# Patient Record
Sex: Male | Born: 1950 | Race: Black or African American | Hispanic: No | Marital: Married | Smoking: Former smoker
Health system: Southern US, Community
[De-identification: ages and names within clinical notes are randomized; demographics above are authoritative.]

## PROBLEM LIST (undated history)

## (undated) DIAGNOSIS — I493 Ventricular premature depolarization: Secondary | ICD-10-CM

## (undated) DIAGNOSIS — I10 Essential (primary) hypertension: Secondary | ICD-10-CM

## (undated) DIAGNOSIS — B1920 Unspecified viral hepatitis C without hepatic coma: Secondary | ICD-10-CM

## (undated) DIAGNOSIS — C61 Malignant neoplasm of prostate: Secondary | ICD-10-CM

## (undated) DIAGNOSIS — G4733 Obstructive sleep apnea (adult) (pediatric): Secondary | ICD-10-CM

## (undated) HISTORY — PX: SPLENECTOMY, TOTAL: SHX788

## (undated) HISTORY — DX: Unspecified viral hepatitis C without hepatic coma: B19.20

## (undated) HISTORY — DX: Obstructive sleep apnea (adult) (pediatric): G47.33

## (undated) HISTORY — DX: Ventricular premature depolarization: I49.3

## (undated) HISTORY — PX: OTHER SURGICAL HISTORY: SHX169

---

## 2000-01-25 ENCOUNTER — Encounter: Payer: Self-pay | Admitting: Gastroenterology

## 2000-01-25 ENCOUNTER — Ambulatory Visit (HOSPITAL_COMMUNITY): Admission: RE | Admit: 2000-01-25 | Discharge: 2000-01-25 | Payer: Self-pay | Admitting: Gastroenterology

## 2000-01-25 ENCOUNTER — Encounter (INDEPENDENT_AMBULATORY_CARE_PROVIDER_SITE_OTHER): Payer: Self-pay | Admitting: *Deleted

## 2000-01-28 ENCOUNTER — Emergency Department (HOSPITAL_COMMUNITY): Admission: EM | Admit: 2000-01-28 | Discharge: 2000-01-28 | Payer: Self-pay | Admitting: Emergency Medicine

## 2000-01-28 ENCOUNTER — Encounter: Payer: Self-pay | Admitting: Emergency Medicine

## 2003-06-27 ENCOUNTER — Encounter (INDEPENDENT_AMBULATORY_CARE_PROVIDER_SITE_OTHER): Payer: Self-pay | Admitting: *Deleted

## 2003-06-27 ENCOUNTER — Inpatient Hospital Stay (HOSPITAL_COMMUNITY): Admission: RE | Admit: 2003-06-27 | Discharge: 2003-06-30 | Payer: Self-pay | Admitting: Urology

## 2003-12-26 ENCOUNTER — Emergency Department (HOSPITAL_COMMUNITY): Admission: EM | Admit: 2003-12-26 | Discharge: 2003-12-26 | Payer: Self-pay | Admitting: Emergency Medicine

## 2004-05-06 DIAGNOSIS — C61 Malignant neoplasm of prostate: Secondary | ICD-10-CM

## 2004-05-06 HISTORY — DX: Malignant neoplasm of prostate: C61

## 2006-06-26 ENCOUNTER — Emergency Department (HOSPITAL_COMMUNITY): Admission: EM | Admit: 2006-06-26 | Discharge: 2006-06-27 | Payer: Self-pay | Admitting: Emergency Medicine

## 2006-07-22 ENCOUNTER — Inpatient Hospital Stay (HOSPITAL_COMMUNITY): Admission: EM | Admit: 2006-07-22 | Discharge: 2006-07-25 | Payer: Self-pay | Admitting: Emergency Medicine

## 2006-08-27 ENCOUNTER — Encounter: Admission: RE | Admit: 2006-08-27 | Discharge: 2006-10-27 | Payer: Self-pay | Admitting: Internal Medicine

## 2007-01-28 ENCOUNTER — Encounter (HOSPITAL_COMMUNITY): Admission: RE | Admit: 2007-01-28 | Discharge: 2007-02-03 | Payer: Self-pay | Admitting: Urology

## 2007-02-09 ENCOUNTER — Ambulatory Visit: Admission: RE | Admit: 2007-02-09 | Discharge: 2007-05-06 | Payer: Self-pay | Admitting: Radiation Oncology

## 2009-06-12 ENCOUNTER — Encounter: Admission: RE | Admit: 2009-06-12 | Discharge: 2009-06-12 | Payer: Self-pay | Admitting: Obstetrics and Gynecology

## 2010-01-20 ENCOUNTER — Emergency Department (HOSPITAL_COMMUNITY): Admission: EM | Admit: 2010-01-20 | Discharge: 2010-01-20 | Payer: Self-pay | Admitting: Emergency Medicine

## 2010-07-19 LAB — DIFFERENTIAL
Basophils Absolute: 0 10*3/uL (ref 0.0–0.1)
Basophils Relative: 0 % (ref 0–1)
Eosinophils Absolute: 0 10*3/uL (ref 0.0–0.7)
Eosinophils Relative: 0 % (ref 0–5)
Lymphocytes Relative: 11 % — ABNORMAL LOW (ref 12–46)
Lymphs Abs: 2.1 10*3/uL (ref 0.7–4.0)
Monocytes Absolute: 1.9 10*3/uL — ABNORMAL HIGH (ref 0.1–1.0)
Monocytes Relative: 10 % (ref 3–12)
Neutro Abs: 14.4 10*3/uL — ABNORMAL HIGH (ref 1.7–7.7)
Neutrophils Relative %: 78 % — ABNORMAL HIGH (ref 43–77)

## 2010-07-19 LAB — BASIC METABOLIC PANEL
BUN: 11 mg/dL (ref 6–23)
CO2: 26 mEq/L (ref 19–32)
Calcium: 9.1 mg/dL (ref 8.4–10.5)
Chloride: 99 mEq/L (ref 96–112)
Creatinine, Ser: 0.87 mg/dL (ref 0.4–1.5)
GFR calc Af Amer: >60 mL/min (ref >60)
GFR calc non Af Amer: >60 mL/min (ref >60)
Glucose, Bld: 112 mg/dL — ABNORMAL HIGH (ref 70–99)
Potassium: 3.7 mEq/L (ref 3.5–5.1)
Sodium: 132 mEq/L — ABNORMAL LOW (ref 135–145)

## 2010-07-19 LAB — CBC
HCT: 47.2 % (ref 39.0–52.0)
Hemoglobin: 16.1 g/dL (ref 13.0–17.0)
MCH: 31.7 pg (ref 26.0–34.0)
MCHC: 34.2 g/dL (ref 30.0–36.0)
MCV: 92.9 fL (ref 78.0–100.0)
Platelets: 165 10*3/uL (ref 150–400)
RBC: 5.09 MIL/uL (ref 4.22–5.81)
RDW: 14 % (ref 11.5–15.5)
WBC: 18.4 10*3/uL — ABNORMAL HIGH (ref 4.0–10.5)

## 2010-09-21 NOTE — Op Note (Signed)
NAME:  Mathew [name], HOYING NO.:  [id number]   MEDICAL RECORD NO.:  [id number]          PATIENT TYPE:  INP   LOCATION:  3703                         FACILITY:  [facility]   PHYSICIAN:  Wonda Horner, M.D.   DATE OF BIRTH:  01-22-1951   DATE OF PROCEDURE:  07/24/2006  DATE OF DISCHARGE:                               OPERATIVE REPORT   PROCEDURE:  Esophagogastroduodenoscopy with biopsy for CLOtest.   INDICATIONS FOR PROCEDURE:  Recent history of melena. The patient has  new onset atrial fibrillation. Dr. Radford Pax wanted to make sure there was  not an abnormality in the upper GI tract such as an ulcer which would  preclude the use of Coumadin.   PREMEDICATION:  Fentanyl 50 mcg IV, Versed 5 mg IV.   Informed consent was obtained after explanation of the risks of  bleeding, infection and perforation.   PROCEDURE:  With the patient in the left lateral decubitus position, the  gastroscope was inserted into the oropharynx and passed into the  esophagus.  It was advanced down the esophagus then into the stomach and  into the duodenum.  The second portion of the duodenum was normal.  The  bulb in the duodenum showed several superficial scattered ulcerations  along with duodenitis.  The stomach showed erosive gastritis in the  antrum.  Biopsy for CLOtest was obtained.  The body of the stomach and  fundus and cardia of the stomach looked normal.  The esophagus looked  normal.  He tolerated the procedure well without complications.   IMPRESSION:  1. Erosive antral gastritis.  2. Duodenitis in the bulb with some superficial scattered ulcerations.   PLAN:  Recommend treatment with a proton pump inhibitor.  I think it  would be okay to put the patient on Coumadin.           ______________________________  Wonda Horner, M.D.     SFG/MEDQ  D:  07/24/2006  T:  07/24/2006  Job:  [id number]   cc:   Fransico Him, M.D.

## 2010-09-21 NOTE — Op Note (Signed)
Mathew [name], Mathew [name] NO.:  [id number]   MEDICAL RECORD NO.:  [id number]                   PATIENT TYPE:  INP   LOCATION:  X003                                 FACILITY:  [facility]   PHYSICIAN:  Lillette Boxer. Dahlstedt, M.D.          DATE OF BIRTH:  12-29-1950   DATE OF PROCEDURE:  06/27/2003  DATE OF DISCHARGE:                                 OPERATIVE REPORT   PREOPERATIVE DIAGNOSIS:  Adenocarcinoma of the prostate.   POSTOPERATIVE DIAGNOSIS:  Adenocarcinoma of the prostate.   PROCEDURE:  Radical retropubic prostatectomy plus bilateral pelvic lymph  node dissection.   SURGEON:  Dr. Diona Fanti   ASSISTANT:  Dr. Berdie Ogren   ANESTHESIA:  General endotracheal.   ESTIMATED BLOOD LOSS:  650 mL.   DRAINS:  1. 20 French Foley to straight drain.  2. #10 Blake drain to bulb suction.   SPECIMENS:  1. Bilateral pelvic lymph nodes for pathologic analysis.  2. Prostate.   COMPLICATIONS:  None.   DISPOSITION:  To recovery room in stable condition.   INDICATION FOR PROCEDURE:  [name] is a 60 year old African-  American male, who was found to have an evaluated PSA in late 2004.  He  subsequently underwent a transrectal ultrasound-guided biopsy of the  prostate which demonstrated an adenocarcinoma.  He had a Gleason 3 + 3 = 6  on 5% of the right, 15% on the left.  Given this finding, he is counseled as  to his treatment options including watchful waiting with expectant  management, hormonal therapy, radiation therapy, and surgery.  After careful  consultation, the patient decided to undergo radical retropubic  prostatectomy.  He signed a consent form after understanding risks,  benefits, and alternatives.   DESCRIPTION OF PROCEDURE:  The patient was brought to the operating room and  correctly identified by his identification bracelet.  She got preoperative  antibiotics and general endotracheal anesthesia.  He was placed in the  supine position  with the table flexed.  He was have and prepped in typical  sterile fashion.  A midline vertical infraumbilical incision was made.  The  patient had previous exploratory laparotomy excision that extended to  approximately 4 cm below the umbilicus.  Our incision extended from the  pubis symphysis to approximately 3 cm below the umbilicus.  Skin was incised  with scalpel.  Camper's and Scarpa's fascia were incised with Bovie  electrocautery down to the rectus fascia.  The decussating fibers in the  midline were incised.  The midline was found.  The rectus abdominus bellies  were bluntly dissected.  Surgeon's hand was used to bluntly define the space  of Retzius.  The Bookwalter retractor was then placed, and bilateral pelvic  lymph node dissection was performed.  Extending the dissection was from the  external iliac vein anteriorly to the obturator nerve posteriorly and  inferiorly.  The nodal tissue was taken to the  circumflex iliac vessel.  The  patient had several branches off of his external iliac vein bilaterally.  These were clipped with small black Hem-o-Lok clips and incised.  Blunt  dissection was employed to free the posterior aspect of the lymph node  packet.  A large clip was place proximally and distally on the packet, and  the packet was incised with Metzenbaum scissors.  Attention was then turned  to the prostatectomy.  The areolar tissue overlying the endopelvic fascia  was swept medially bilaterally.  The endopelvic fascia was incised with the  Metzenbaum scissors and carefully dissected with a right angle dissection  and Bovie electrocautery to free the endopelvic fascia away from the  prostate along the entire length of the gland.  There was a small  superficial branch of the dorsal vein complex which was cauterized with the  Bovie electrocautery and incised.  A Kitner was used to sweep the tissues  overlying the puboprostatic ligaments.  These were incised bilaterally  with  Metzenbaum scissors, dropping the apex of the prostate away from the pubis.  Surgeon's hand could then be placed around the apex of the prostate.  The  groove posterior to the dorsal vein complex and anterior to the urethra was  established with surgeon's finger and thumb.  A Hohenfellner right-angle was  then placed within this groove to separate out the dorsal venous complex  away from the urethra.  Two #1 Vicryls ties were passed around the dorsal  venous complex and tied.  The Stamey retractor was placed around the  prostate and a backbleeding stitch using 0 Vicryl was placed in a figure-of-  eight fashion. The Hohenfellner was again placed beneath the dorsal venous  complex and a suture of 0 Vicryl suture ligature was passed and tied.  The  needle was then used, and a figure-of-eight stitch was __________ in the  distal-most aspect of the dorsal venous complex and tied down.  The  Hohenfellner was re-placed, and the Bovie electrocautery was used to incise  the dorsal venous complex carefully.  There was very little bleeding from  the complex after the incision, and no oversewing was required.  The urethra  could now be visualized beneath the dorsal venous complex.  The  neurovascular bundle tissue was seen running lateral to the urethra  bilaterally.  This tissue was carefully dissected away from the urethra with  Metzenbaum scissors.  The Hohenfellner  right-angle was then passed beneath  the urethra, and a piece of umbilical tape was passed around the urethra.  The anterior two-thirds of the urethral wall was incised just distal to the  prostatic apex.  The __________ of the Foley catheter was then well-  lubricated and incised, and the Claiborne Billings was used to grasp the cut end of the  Foley catheter and pull it into the wound.  The posterior urethra was then  incised with Metzenbaum scissors.  There was some rectourethralis attachments posteriorly which were taken down with right  angle dissection  with Metzenbaum scissors.  The lateral pedicles of the prostate were then  taken down systematically with right-angle dissection and placing Hem-o-Lok  clips on the pedicles incising them with Metzenbaum scissors.  The  neurovascular bundles were pushed posteriorly away from the plane of  dissection here.  After the lateral pedicles were taken down sufficiently,  attention was turned to the bladder neck.   Allis clamps were placed proximal and distal to the bladder neck, and a  Vanderbilt clamp was  used to carefully dissect the base of the prostate away  from the bladder neck.  Once the urethra was completely dissected away from  the base of the prostate, a right-angle was placed posterior to the urethra,  and the urethra was incised.  The balloon was drained from the Foley  catheter, and the Foley catheter was brought out through this incision.  The  posterior urethra was then incised with Metzenbaum scissors.  The remainder  of the posterior bladder and prostatic base were then sharply dissected away  from each other, and a good plane was established here.   Now, the only remaining attachments of the prostate were the anterior lobes  of the vas deferens and the seminal vesicles.  There was a little remaining  lateral pedicle bilaterally which was clipped and incised with Metzenbaum  scissors.  On the posterior side of the prostate, the Denonvillier's fascia  was scored with Bovie electrocautery, and this plane was defined with  Metzenbaum scissors.  A right angle was passed around the ampulla of the vas  deferens bilaterally, and large Hem-o-Lok clips were placed on the vas, and  they were incised with the Metzenbaum scissors.  A right angle was placed on  the base of the seminal vesicles bilaterally, and they were incised and tied  with 2-0 silk ties.  The prostate was now freed in its entirety.   The bladder neck was reconstructed.  Several interrupted 4-0 chromic  sutures  were used to evert the bladder mucosa.  A tennis racquet stitch was placed  at 6 o'clock and run to close the bladder neck for approximately 2 cm.  This  gave [place] a very nice, well-everted bladder neck.  The Greenwald urethral  sound was placed within the bladder.  There was excellent urethral stump.  Anastomotic sutures were placed at 2, 5, 7, 10, and 12 o'clock.  The Foley  catheter was placed within the bladder and inflated with approximately 20 mL  of sterile water.  The anastomotic sutures were placed in their  corresponding positions in the bladder neck.  The bladder was released from  traction and allowed to drop down into the pelvis.  Prior to this, careful  inspection for any bleeding revealed no active bleeding.  The anastomotic  sutures were tied down.  A Septo syringe was used to irrigate the Foley  catheter, and there were no clots within the bladder and no leakage at the anastomosis, beginning a very watertight anastomosis.  The pelvis was  copiously irrigated with antibiotic solution.  A #10 Blake drain was placed  in the left lower quadrant by making a stab incision and passing a tonsill  through the anterior body wall and pulling the drain through.  The drain was  sewn in place with 3-0 nylon.  The rectus sheath was reapproximated with a  running #1 PDS.  Subcutaneous tissues were copiously irrigated.  The skin  was closed with a 4-0 PDS in a running fashion.  The anastomotic sutures  used at the urethral stump were 2-0 PDS.  All sponge and needle counts were  correct at the end of the case.  The patient was awakened from his  anesthesia in stable condition, having tolerated the procedure well.  Please  note that Dr. Diona Fanti was present and participated in all aspects of the  case, as he is the primary surgeon.     Berdie Ogren, MD  Lillette Boxer. Dahlstedt, M.D.    DR/MEDQ  D:  06/27/2003  T:  06/27/2003  Job:  [id number]
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
## 2010-09-21 NOTE — Consult Note (Signed)
NAME:  Mathew [name], SHUCK NO.:  [id number]   MEDICAL RECORD NO.:  [id number]          PATIENT TYPE:  INP   LOCATION:  3703                         FACILITY:  [place]   PHYSICIAN:  Fransico Him, M.D.     DATE OF BIRTH:  [birth year]   DATE OF CONSULTATION:  DATE OF DISCHARGE:                                 CONSULTATION   ADDENDUM:  Dr. Penelope Coop has seen the patient and in light of his new onset AFib and  need for anticoagulation, he thinks it is best that we do an upper  endoscopy, which will probably be done tomorrow.  Thank you very much  for this consultation.      Melton Alar, [place]      Fransico Him, M.D.  Electronically Signed    MLY/MEDQ  D:  07/23/2006  T:  07/23/2006  Job:  [id number]   cc:   Wonda Horner, M.D.
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
## 2011-07-31 ENCOUNTER — Other Ambulatory Visit: Payer: Self-pay

## 2011-07-31 ENCOUNTER — Encounter (HOSPITAL_COMMUNITY): Payer: Self-pay | Admitting: Emergency Medicine

## 2011-07-31 ENCOUNTER — Emergency Department (HOSPITAL_COMMUNITY)
Admission: EM | Admit: 2011-07-31 | Discharge: 2011-07-31 | Disposition: A | Discharge status: Home / Self Care | Payer: BC Managed Care – PPO | Attending: Emergency Medicine | Admitting: Emergency Medicine

## 2011-07-31 DIAGNOSIS — F17200 Nicotine dependence, unspecified, uncomplicated: Secondary | ICD-10-CM | POA: Insufficient documentation

## 2011-07-31 DIAGNOSIS — R6883 Chills (without fever): Secondary | ICD-10-CM | POA: Insufficient documentation

## 2011-07-31 DIAGNOSIS — R5381 Other malaise: Secondary | ICD-10-CM | POA: Insufficient documentation

## 2011-07-31 DIAGNOSIS — E876 Hypokalemia: Secondary | ICD-10-CM

## 2011-07-31 DIAGNOSIS — Z9889 Other specified postprocedural states: Secondary | ICD-10-CM | POA: Insufficient documentation

## 2011-07-31 DIAGNOSIS — E860 Dehydration: Secondary | ICD-10-CM

## 2011-07-31 DIAGNOSIS — R197 Diarrhea, unspecified: Secondary | ICD-10-CM | POA: Insufficient documentation

## 2011-07-31 DIAGNOSIS — A088 Other specified intestinal infections: Secondary | ICD-10-CM | POA: Insufficient documentation

## 2011-07-31 DIAGNOSIS — I10 Essential (primary) hypertension: Secondary | ICD-10-CM | POA: Insufficient documentation

## 2011-07-31 DIAGNOSIS — A084 Viral intestinal infection, unspecified: Secondary | ICD-10-CM

## 2011-07-31 DIAGNOSIS — Z79899 Other long term (current) drug therapy: Secondary | ICD-10-CM | POA: Insufficient documentation

## 2011-07-31 DIAGNOSIS — R109 Unspecified abdominal pain: Secondary | ICD-10-CM | POA: Insufficient documentation

## 2011-07-31 HISTORY — DX: Essential (primary) hypertension: I10

## 2011-07-31 LAB — DIFFERENTIAL
Basophils Absolute: 0.1 10*3/uL (ref 0.0–0.1)
Basophils Relative: 1 % (ref 0–1)
Eosinophils Absolute: 0.3 10*3/uL (ref 0.0–0.7)
Eosinophils Relative: 3 % (ref 0–5)
Lymphocytes Relative: 38 % (ref 12–46)
Lymphs Abs: 4.2 10*3/uL — ABNORMAL HIGH (ref 0.7–4.0)
Monocytes Absolute: 1 10*3/uL (ref 0.1–1.0)
Monocytes Relative: 9 % (ref 3–12)
Neutro Abs: 5.4 10*3/uL (ref 1.7–7.7)
Neutrophils Relative %: 50 % (ref 43–77)

## 2011-07-31 LAB — COMPREHENSIVE METABOLIC PANEL
ALT: 80 U/L — ABNORMAL HIGH (ref 0–53)
AST: 49 U/L — ABNORMAL HIGH (ref 0–37)
Albumin: 3.9 g/dL (ref 3.5–5.2)
Alkaline Phosphatase: 80 U/L (ref 39–117)
BUN: 22 mg/dL (ref 6–23)
CO2: 21 mEq/L (ref 19–32)
Calcium: 9.4 mg/dL (ref 8.4–10.5)
Chloride: 99 mEq/L (ref 96–112)
Creatinine, Ser: 1.12 mg/dL (ref 0.50–1.35)
GFR calc Af Amer: 81 mL/min — ABNORMAL LOW (ref >90)
GFR calc non Af Amer: 70 mL/min — ABNORMAL LOW (ref >90)
Glucose, Bld: 95 mg/dL (ref 70–99)
Potassium: 3 mEq/L — ABNORMAL LOW (ref 3.5–5.1)
Sodium: 133 mEq/L — ABNORMAL LOW (ref 135–145)
Total Bilirubin: 0.4 mg/dL (ref 0.3–1.2)
Total Protein: 8.7 g/dL — ABNORMAL HIGH (ref 6.0–8.3)

## 2011-07-31 LAB — URINALYSIS, ROUTINE W REFLEX MICROSCOPIC
Bilirubin Urine: NEGATIVE
Glucose, UA: NEGATIVE mg/dL
Leukocytes, UA: NEGATIVE
Nitrite: NEGATIVE
Protein, ur: 30 mg/dL — AB
Specific Gravity, Urine: 1.03 (ref 1.005–1.030)
Urobilinogen, UA: 0.2 mg/dL (ref 0.0–1.0)
pH: 6 (ref 5.0–8.0)

## 2011-07-31 LAB — CBC
HCT: 51.4 % (ref 39.0–52.0)
Hemoglobin: 17.9 g/dL — ABNORMAL HIGH (ref 13.0–17.0)
MCH: 31.4 pg (ref 26.0–34.0)
MCHC: 34.8 g/dL (ref 30.0–36.0)
MCV: 90.2 fL (ref 78.0–100.0)
Platelets: 245 10*3/uL (ref 150–400)
RBC: 5.7 MIL/uL (ref 4.22–5.81)
RDW: 14.2 % (ref 11.5–15.5)
WBC: 10.9 10*3/uL — ABNORMAL HIGH (ref 4.0–10.5)

## 2011-07-31 LAB — URINE MICROSCOPIC-ADD ON

## 2011-07-31 LAB — LIPASE, BLOOD: Lipase: 28 U/L (ref 11–59)

## 2011-07-31 MED ORDER — SODIUM CHLORIDE 0.9 % IV BOLUS (SEPSIS)
1000.0000 mL | Freq: Once | INTRAVENOUS | Status: AC
  Administered 2011-07-31: 1000 mL via INTRAVENOUS

## 2011-07-31 MED ORDER — ONDANSETRON 8 MG PO TBDP: 8.0000 mg | ORAL_TABLET | Freq: Three times a day (TID) | ORAL | Status: AC | PRN

## 2011-07-31 MED ORDER — POTASSIUM CHLORIDE CRYS ER 20 MEQ PO TBCR: 40.0000 meq | EXTENDED_RELEASE_TABLET | Freq: Every day | ORAL | Status: DC

## 2011-07-31 MED ORDER — POTASSIUM CHLORIDE CRYS ER 20 MEQ PO TBCR
40.0000 meq | EXTENDED_RELEASE_TABLET | Freq: Once | ORAL | Status: AC
  Administered 2011-07-31: 40 meq via ORAL
  Filled 2011-07-31: qty 2

## 2011-07-31 MED ORDER — HYDROCODONE-ACETAMINOPHEN 5-325 MG PO TABS: 1.0000 | ORAL_TABLET | Freq: Four times a day (QID) | ORAL | Status: AC | PRN

## 2011-07-31 NOTE — ED Notes (Signed)
Pt informed of need for urine and given a urinal.

## 2011-07-31 NOTE — ED Notes (Signed)
Pt presenting to ed with c/o nausea, vomiting and diarrhea x 4 days. Pt is alert and oriented at this time. Pt states he ate at a cookout Saturday and has been sick since. Pt states he was up 10 times last night with diarrhea. Pt states he feels weak and can't keep anything down.

## 2011-07-31 NOTE — Discharge Instructions (Signed)
Dehydration, Adult Dehydration is when you lose more fluids from the body than you take in. Vital organs like the kidneys, brain, and heart cannot function without a proper amount of fluids and salt. Any loss of fluids from the body can cause dehydration.  CAUSES   Vomiting.   Diarrhea.   Excessive sweating.   Excessive urine output.   Fever.  SYMPTOMS  Mild dehydration  Thirst.   Dry lips.   Slightly dry mouth.  Moderate dehydration  Very dry mouth.   Sunken eyes.   Skin does not bounce back quickly when lightly pinched and released.   Dark urine and decreased urine production.   Decreased tear production.   Headache.  Severe dehydration  Very dry mouth.   Extreme thirst.   Rapid, weak pulse (more than 100 beats per minute at rest).   Cold hands and feet.   Not able to sweat in spite of heat and temperature.   Rapid breathing.   Blue lips.   Confusion and lethargy.   Difficulty being awakened.   Minimal urine production.   No tears.  DIAGNOSIS  Your caregiver will diagnose dehydration based on your symptoms and your exam. Blood and urine tests will help confirm the diagnosis. The diagnostic evaluation should also identify the cause of dehydration. TREATMENT  Treatment of mild or moderate dehydration can often be done at home by increasing the amount of fluids that you drink. It is best to drink small amounts of fluid more often. Drinking too much at one time can make vomiting worse. Refer to the home care instructions below. Severe dehydration needs to be treated at the hospital where you will probably be given intravenous (IV) fluids that contain water and electrolytes. HOME CARE INSTRUCTIONS   Ask your caregiver about specific rehydration instructions.   Drink enough fluids to keep your urine clear or pale yellow.   Drink small amounts frequently if you have nausea and vomiting.   Eat as you normally do.   Avoid:   Foods or drinks high in  sugar.   Carbonated drinks.   Juice.   Extremely hot or cold fluids.   Drinks with caffeine.   Fatty, greasy foods.   Alcohol.   Tobacco.   Overeating.   Gelatin desserts.   Wash your hands well to avoid spreading bacteria and viruses.   Only take over-the-counter or prescription medicines for pain, discomfort, or fever as directed by your caregiver.   Ask your caregiver if you should continue all prescribed and over-the-counter medicines.   Keep all follow-up appointments with your caregiver.  SEEK MEDICAL CARE IF:  You have abdominal pain and it increases or stays in one area (localizes).   You have a rash, stiff neck, or severe headache.   You are irritable, sleepy, or difficult to awaken.   You are weak, dizzy, or extremely thirsty.  SEEK IMMEDIATE MEDICAL CARE IF:   You are unable to keep fluids down or you get worse despite treatment.   You have frequent episodes of vomiting or diarrhea.   You have blood or green matter (bile) in your vomit.   You have blood in your stool or your stool looks black and tarry.   You have not urinated in 6 to 8 hours, or you have only urinated a small amount of very dark urine.   You have a fever.   You faint.  MAKE SURE YOU:   Understand these instructions.   Will watch your condition.  Will get help right away if you are not doing well or get worse.  Document Released: 04/22/2005 Document Revised: 04/11/2011 Document Reviewed: 12/10/2010 [facility] Patient Information 2012 [place].Hypokalemia Hypokalemia means a low potassium level in the blood.Potassium is an electrolyte that helps regulate the amount of fluid in the body. It also stimulates muscle contraction and maintains a stable acid-base balance.Most of the body's potassium is inside of cells, and only a very small amount is in the blood. Because the amount in the blood is so small, minor changes can have big effects. PREPARATION FOR TEST Testing for  potassium requires taking a blood sample taken by needle from a vein in the arm. The skin is cleaned thoroughly before the sample is drawn. There is no other special preparation needed. NORMAL VALUES Potassium levels below 3.5 mEq/L are abnormally low. Levels above 5.1 mEq/L are abnormally high. Ranges for normal findings may vary among different laboratories and hospitals. You should always check with your doctor after having lab work or other tests done to discuss the meaning of your test results and whether your values are considered within normal limits. MEANING OF TEST  Your caregiver will go over the test results with you and discuss the importance and meaning of your results, as well as treatment options and the need for additional tests, if necessary. A potassium level is frequently part of a routine medical exam. It is usually included as part of a whole "panel" of tests for several blood salts (such as Sodium and Chloride). It may be done as part of follow-up when a low potassium level was found in the past or other blood salts are suspected of being out of balance. A low potassium level might be suspected if you have one or more of the following:  Symptoms of weakness.   Abnormal heart rhythms.   High blood pressure and are taking medication to control this, especially water pills (diuretics).   Kidney disease that can affect your potassium level .   Diabetes requiring the use of insulin. The potassium may fall after taking insulin, especially if the diabetes had been out of control for a while.   A condition requiring the use of cortisone-type medication or certain types of antibiotics.   Vomiting and/or diarrhea for more than a day or two.   A stomach or intestinal condition that may not permit appropriate absorption of potassium.   Fainting episodes.   Mental confusion.  OBTAINING TEST RESULTS It is your responsibility to obtain your test results. Ask the lab or department  performing the test when and how you will get your results.  Please contact your caregiver directly if you have not received the results within one week. At that time, ask if there is anything different or new you should be doing in relation to the results. TREATMENT Hypokalemia can be treated with potassium supplements taken by mouth and/or adjustments in your current medications. A diet high in potassium is also helpful. Foods with high potassium content are:  Peas, lentils, lima beans, nuts, and dried fruit.   Whole grain and bran cereals and breads.   Fresh fruit, vegetables (bananas, cantaloupe, grapefruit, oranges, tomatoes, honeydew melons, potatoes).   Orange and tomato juices.   Meats. If potassium supplement has been prescribed for you today or your medications have been adjusted, see your personal caregiver in time02 for a re-check.  SEEK MEDICAL CARE IF:  There is a feeling of worsening weakness.   You experience repeated chest  palpitations.   You are diabetic and having difficulty keeping your blood sugars in the normal range.   You are experiencing vomiting and/or diarrhea.   You are having difficulty with any of your regular medications.  SEEK IMMEDIATE MEDICAL CARE IF:  You experience chest pain, shortness of breath, or episodes of dizziness.   You have been having vomiting or diarrhea for more than 2 days.   You have a fainting episode.  MAKE SURE YOU:   Understand these instructions.   Will watch your condition.   Will get help right away if you are not doing well or get worse.  Document Released: 04/22/2005 Document Revised: 04/11/2011 Document Reviewed: 04/02/2008 [facility] Patient Information 2012 [place].Hypokalemia Hypokalemia means a low potassium level in the blood.Potassium is an electrolyte that helps regulate the amount of fluid in the body. It also stimulates muscle contraction and maintains a stable acid-base balance.Most of the body's  potassium is inside of cells, and only a very small amount is in the blood. Because the amount in the blood is so small, minor changes can have big effects. PREPARATION FOR TEST Testing for potassium requires taking a blood sample taken by needle from a vein in the arm. The skin is cleaned thoroughly before the sample is drawn. There is no other special preparation needed. NORMAL VALUES Potassium levels below 3.5 mEq/L are abnormally low. Levels above 5.1 mEq/L are abnormally high. Ranges for normal findings may vary among different laboratories and hospitals. You should always check with your doctor after having lab work or other tests done to discuss the meaning of your test results and whether your values are considered within normal limits. MEANING OF TEST  Your caregiver will go over the test results with you and discuss the importance and meaning of your results, as well as treatment options and the need for additional tests, if necessary. A potassium level is frequently part of a routine medical exam. It is usually included as part of a whole "panel" of tests for several blood salts (such as Sodium and Chloride). It may be done as part of follow-up when a low potassium level was found in the past or other blood salts are suspected of being out of balance. A low potassium level might be suspected if you have one or more of the following:  Symptoms of weakness.   Abnormal heart rhythms.   High blood pressure and are taking medication to control this, especially water pills (diuretics).   Kidney disease that can affect your potassium level .   Diabetes requiring the use of insulin. The potassium may fall after taking insulin, especially if the diabetes had been out of control for a while.   A condition requiring the use of cortisone-type medication or certain types of antibiotics.   Vomiting and/or diarrhea for more than a day or two.   A stomach or intestinal condition that may not  permit appropriate absorption of potassium.   Fainting episodes.   Mental confusion.  OBTAINING TEST RESULTS It is your responsibility to obtain your test results. Ask the lab or department performing the test when and how you will get your results.  Please contact your caregiver directly if you have not received the results within one week. At that time, ask if there is anything different or new you should be doing in relation to the results. TREATMENT Hypokalemia can be treated with potassium supplements taken by mouth and/or adjustments in your current medications. A diet high in  potassium is also helpful. Foods with high potassium content are:  Peas, lentils, lima beans, nuts, and dried fruit.   Whole grain and bran cereals and breads.   Fresh fruit, vegetables (bananas, cantaloupe, grapefruit, oranges, tomatoes, honeydew melons, potatoes).   Orange and tomato juices.   Meats. If potassium supplement has been prescribed for you today or your medications have been adjusted, see your personal caregiver in time02 for a re-check.  SEEK MEDICAL CARE IF:  There is a feeling of worsening weakness.   You experience repeated chest palpitations.   You are diabetic and having difficulty keeping your blood sugars in the normal range.   You are experiencing vomiting and/or diarrhea.   You are having difficulty with any of your regular medications.  SEEK IMMEDIATE MEDICAL CARE IF:  You experience chest pain, shortness of breath, or episodes of dizziness.   You have been having vomiting or diarrhea for more than 2 days.   You have a fainting episode.  MAKE SURE YOU:   Understand these instructions.   Will watch your condition.   Will get help right away if you are not doing well or get worse.  Document Released: 04/22/2005 Document Revised: 04/11/2011 Document Reviewed: 04/02/2008 [facility] Patient Information 2012 [place].

## 2011-07-31 NOTE — ED Provider Notes (Signed)
History     CSN: [id number]  Arrival date & time 07/31/11  4462   First MD Initiated Contact with Patient 07/31/11 [id number]      Chief Complaint  Patient presents with  . Diarrhea    (Consider location/radiation/quality/duration/timing/severity/associated sxs/prior treatment) HPI Patient is a 61 year old male who presents today complaining of nausea, vomiting, and diarrhea for the past 4 days. He denies any fevers. Patient said he was having severe diffuse abdominal pain prior to presentation but has no pain at this time. He has felt chills but denies any fevers. Patient past surgical history significant for a traumatic splenectomy but no other prior abdominal surgeries. He was at a cookout on Saturday and wondered if this might be related but his symptoms have persisted over 24 hours. He has no known sick contacts. Patient denies any blood in his emesis or stools. He denies any urinary symptoms. Nothing has made his pain better or worse. Patient is unable to tolerate oral intake at this point.There are no other associated or modifying factors.  Past Medical History  Diagnosis Date  . Hypertension     Past Surgical History  Procedure Date  . Splenectomy, total   . Prostectomy     History reviewed. No pertinent family history.  History  Substance Use Topics  . Smoking status: Current Everyday Smoker -- 0.5 packs/day    Types: Cigarettes  . Smokeless tobacco: Not on file  . Alcohol Use: Yes     weekends      Review of Systems  Constitutional: Positive for fatigue.  HENT: Negative.   Eyes: Negative.   Respiratory: Negative.   Cardiovascular: Negative.   Gastrointestinal: Positive for nausea, vomiting, abdominal pain and diarrhea.  Genitourinary: Negative.   Musculoskeletal: Negative.   Skin: Negative.   Neurological: Negative.   Hematological: Negative.   Psychiatric/Behavioral: Negative.   All other systems reviewed and are negative.    Allergies  Review of  patient's allergies indicates not on file.  Home Medications   Current Outpatient Rx  Name Route Sig Dispense Refill  . ASPIRIN EC 81 MG PO TBEC Oral Take 81 mg by mouth every morning.     Marland Kitchen HYDROCHLOROTHIAZIDE 25 MG PO TABS Oral Take 25 mg by mouth daily.    Marland Kitchen METOPROLOL TARTRATE 50 MG PO TABS Oral Take 75 mg by mouth every morning.    Marland Kitchen PRESCRIPTION MEDICATION Intramuscular Inject 1 Syringe into the muscle as needed. Prostaglandin Injection. For erectile dysfunction.    Marland Kitchen HYDROCODONE-ACETAMINOPHEN 5-325 MG PO TABS Oral Take 1 tablet by mouth every 6 (six) hours as needed for pain. 10 tablet 0  . ONDANSETRON 8 MG PO TBDP Oral Take 1 tablet (8 mg total) by mouth every 8 (eight) hours as needed for nausea. 20 tablet 0  . POTASSIUM CHLORIDE CRYS ER 20 MEQ PO TBCR Oral Take 2 tablets (40 mEq total) by mouth daily. 4 tablet 0    BP 129/84  Pulse 99  Temp(Src) 97.7 F (36.5 C) (Oral)  Resp 18  SpO2 100%  Physical Exam  Nursing note and vitals reviewed. GEN: Well-developed, well-nourished male in no distress HEENT: Atraumatic, normocephalic. Oropharynx clear without erythema EYES: PERRLA BL, no scleral icterus. NECK: Trachea midline, no meningismus CV: regular rate and rhythm. No murmurs, rubs, or gallops PULM: No respiratory distress.  No crackles, wheezes, or rales. GI: soft, non-tender. No guarding, rebound, or tenderness. + bowel sounds  GU: deferred Neuro: cranial nerves 2-12 intact, no abnormalities of strength or  sensation, A and O x 3 MSK: Patient moves all 4 extremities symmetrically, no deformity, edema, or injury noted Skin: No rashes petechiae, purpura, or jaundice Psych: no abnormality of mood   ED Course  Procedures (including critical care time)    Date: 07/31/2011  Rate: 78  Rhythm: normal sinus rhythm  QRS Axis: normal  Intervals: normal  ST/T Wave abnormalities: normal  Conduction Disutrbances: none  Narrative Interpretation: unremarkable      Labs  Reviewed  CBC - Abnormal; Notable for the following:    WBC 10.9 (*)    Hemoglobin 17.9 (*)    All other components within normal limits  DIFFERENTIAL - Abnormal; Notable for the following:    Lymphs Abs 4.2 (*)    All other components within normal limits  COMPREHENSIVE METABOLIC PANEL - Abnormal; Notable for the following:    Sodium 133 (*)    Potassium 3.0 (*)    Total Protein 8.7 (*)    AST 49 (*)    ALT 80 (*)    GFR calc non Af Amer 70 (*)    GFR calc Af Amer 81 (*)    All other components within normal limits  URINALYSIS, ROUTINE W REFLEX MICROSCOPIC - Abnormal; Notable for the following:    APPearance CLOUDY (*)    Hgb urine dipstick SMALL (*)    Ketones, ur TRACE (*)    Protein, ur 30 (*)    All other components within normal limits  URINE MICROSCOPIC-ADD ON - Abnormal; Notable for the following:    Bacteria, UA MANY (*)    Casts HYALINE CASTS (*) GRANULAR CAST   All other components within normal limits  LIPASE, BLOOD   No results found.   1. Viral gastroenteritis   2. Hypokalemia   3. Dehydration       MDM  Patient presents today with symptoms of viral gastroenteritis. Labs were ordered to rule out other possible causes for his symptoms. Urinalysis was remarkable for evidence of dehydration. Patient did have hypokalemia on renal panel. He is slight elevation of his transaminases but had no abdominal pain whatsoever. Lipase was also within normal limits. Patient had no respiratory symptoms that would indicate need for chest x-ray. As patient was feeling better after 2 L of IV fluids as well as Zofran no further therapy was necessary. Patient did have slight hypokalemia and was given potassium by mouth for this. EKG did confirm there were no significant changes with this. Patient was discharged with prescriptions for Zofran as well as Vicodin if his pain returns. He was also given 2 more days of potassium to take given his hypokalemia today. Patient was comfortable  with plan was discharged home in good condition.        Chauncy Passy, MD 07/31/11 1322

## 2011-11-28 ENCOUNTER — Encounter (HOSPITAL_COMMUNITY): Payer: Self-pay | Admitting: *Deleted

## 2011-11-28 ENCOUNTER — Emergency Department (HOSPITAL_COMMUNITY)
Admission: EM | Admit: 2011-11-28 | Discharge: 2011-11-29 | Disposition: A | Discharge status: Home / Self Care | Payer: BC Managed Care – PPO | Attending: Emergency Medicine | Admitting: Emergency Medicine

## 2011-11-28 DIAGNOSIS — I10 Essential (primary) hypertension: Secondary | ICD-10-CM | POA: Insufficient documentation

## 2011-11-28 DIAGNOSIS — N390 Urinary tract infection, site not specified: Secondary | ICD-10-CM | POA: Insufficient documentation

## 2011-11-28 DIAGNOSIS — R319 Hematuria, unspecified: Secondary | ICD-10-CM | POA: Insufficient documentation

## 2011-11-28 DIAGNOSIS — F17200 Nicotine dependence, unspecified, uncomplicated: Secondary | ICD-10-CM | POA: Insufficient documentation

## 2011-11-28 LAB — URINALYSIS, ROUTINE W REFLEX MICROSCOPIC
Bilirubin Urine: NEGATIVE
Glucose, UA: NEGATIVE mg/dL
Ketones, ur: NEGATIVE mg/dL
Nitrite: NEGATIVE
Protein, ur: 100 mg/dL — AB
Specific Gravity, Urine: 1.024 (ref 1.005–1.030)
Urobilinogen, UA: 1 mg/dL (ref 0.0–1.0)
pH: 6.5 (ref 5.0–8.0)

## 2011-11-28 LAB — URINE MICROSCOPIC-ADD ON

## 2011-11-28 MED ORDER — CIPROFLOXACIN HCL 500 MG PO TABS
500.0000 mg | ORAL_TABLET | Freq: Once | ORAL | Status: AC
  Administered 2011-11-28: 500 mg via ORAL
  Filled 2011-11-28: qty 1

## 2011-11-28 MED ORDER — CIPROFLOXACIN HCL 500 MG PO TABS: 500.0000 mg | ORAL_TABLET | Freq: Two times a day (BID) | ORAL | Status: AC

## 2011-11-28 NOTE — ED Notes (Signed)
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 Pt denies any abd pain or dysuria. Pt states this past weekend he did experience some burning w/ urination however that has subsided and has noted experienced any today. Pt also denies fever, n/v/d.

## 2011-11-28 NOTE — ED Provider Notes (Signed)
History     CSN: [id number]  Arrival date & time 11/28/11  2116   First MD Initiated Contact with Patient 11/28/11 2250      Chief Complaint  Patient presents with  . Hematuria    HPI Pt has history of microscopic hematuria but tonight clots of blood today.  He had noticed some dysuria earlier in the week.  He has had some frequency as well.  No fevers,.  No vomiting or diarrhea.  Nothing seems to make it better or worse.   Past Medical History  Diagnosis Date  . Hypertension     Past Surgical History  Procedure Date  . Splenectomy, total   . Prostectomy     History reviewed. No pertinent family history.  History  Substance Use Topics  . Smoking status: Current Everyday Smoker -- 0.5 packs/day    Types: Cigarettes  . Smokeless tobacco: Not on file  . Alcohol Use: Yes     weekends      Review of Systems  All other systems reviewed and are negative.    Allergies  Review of patient's allergies indicates no known allergies.  Home Medications   Current Outpatient Rx  Name Route Sig Dispense Refill  . ASPIRIN EC 81 MG PO TBEC Oral Take 81 mg by mouth every morning.     Marland Kitchen HYDROCHLOROTHIAZIDE 25 MG PO TABS Oral Take 25 mg by mouth daily.    Marland Kitchen METOPROLOL TARTRATE 50 MG PO TABS Oral Take 75 mg by mouth every morning.    Marland Kitchen PRESCRIPTION MEDICATION Intramuscular Inject 1 Syringe into the muscle as needed. Prostaglandin Injection. For erectile dysfunction.      BP 134/88  Pulse 84  Temp 98.5 F (36.9 C) (Oral)  Resp 16  SpO2 95%  Physical Exam  Nursing note and vitals reviewed. Constitutional: He appears well-developed and well-nourished. No distress.  HENT:  Head: Normocephalic and atraumatic.  Right Ear: External ear normal.  Left Ear: External ear normal.  Eyes: Conjunctivae are normal. Right eye exhibits no discharge. Left eye exhibits no discharge. No scleral icterus.  Neck: Neck supple. No tracheal deviation present.  Cardiovascular: Normal rate,  regular rhythm and intact distal pulses.   Pulmonary/Chest: Effort normal and breath sounds normal. No stridor. No respiratory distress. He has no wheezes. He has no rales.  Abdominal: Soft. Bowel sounds are normal. He exhibits no distension. There is no tenderness. There is no rebound and no guarding.  Musculoskeletal: He exhibits no edema and no tenderness.  Neurological: He is alert. He has normal strength. No sensory deficit. Cranial nerve deficit:  no gross defecits noted. He exhibits normal muscle tone. He displays no seizure activity. Coordination normal.  Skin: Skin is warm and dry. No rash noted.  Psychiatric: He has a normal mood and affect.    ED Course  Procedures (including critical care time)  Labs Reviewed  URINALYSIS, ROUTINE W REFLEX MICROSCOPIC - Abnormal; Notable for the following:    Color, Urine AMBER (*)  BIOCHEMICALS MAY BE AFFECTED BY COLOR   APPearance CLOUDY (*)     Hgb urine dipstick LARGE (*)     Protein, ur 100 (*)     Leukocytes, UA SMALL (*)     All other components within normal limits  URINE MICROSCOPIC-ADD ON  URINE CULTURE   No results found.   1. UTI (lower urinary tract infection)   2. Hematuria       MDM  Pt appears to have a UTI.  Will dc home on oral abx.  He has plans to follow up with his urologist.  If the symptoms persist, he made need cystoscopy.  At this time there does not appear to be any evidence of an acute emergency medical condition and the patient appears stable for discharge with appropriate outpatient follow up.         Kathalene Frames, MD 11/29/11 [phone]

## 2011-11-29 LAB — POCT I-STAT, CHEM 8
BUN: 13 mg/dL (ref 6–23)
Calcium, Ion: 1.22 mmol/L (ref 1.12–1.23)
Chloride: 103 mEq/L (ref 96–112)
Creatinine, Ser: 0.9 mg/dL (ref 0.50–1.35)
Glucose, Bld: 99 mg/dL (ref 70–99)
HCT: 48 % (ref 39.0–52.0)
Hemoglobin: 16.3 g/dL (ref 13.0–17.0)
Potassium: 3.3 mEq/L — ABNORMAL LOW (ref 3.5–5.1)
Sodium: 139 mEq/L (ref 135–145)
TCO2: 24 mmol/L (ref 0–100)

## 2011-11-30 LAB — URINE CULTURE: Colony Count: >=100000

## 2012-07-25 ENCOUNTER — Emergency Department (HOSPITAL_COMMUNITY)
Admission: EM | Admit: 2012-07-25 | Discharge: 2012-07-25 | Disposition: A | Discharge status: Home / Self Care | Payer: BC Managed Care – PPO | Attending: Emergency Medicine | Admitting: Emergency Medicine

## 2012-07-25 ENCOUNTER — Encounter (HOSPITAL_COMMUNITY): Payer: Self-pay | Admitting: *Deleted

## 2012-07-25 DIAGNOSIS — F17200 Nicotine dependence, unspecified, uncomplicated: Secondary | ICD-10-CM | POA: Insufficient documentation

## 2012-07-25 DIAGNOSIS — Z79899 Other long term (current) drug therapy: Secondary | ICD-10-CM | POA: Insufficient documentation

## 2012-07-25 DIAGNOSIS — N390 Urinary tract infection, site not specified: Secondary | ICD-10-CM | POA: Insufficient documentation

## 2012-07-25 DIAGNOSIS — Z8546 Personal history of malignant neoplasm of prostate: Secondary | ICD-10-CM | POA: Insufficient documentation

## 2012-07-25 DIAGNOSIS — R319 Hematuria, unspecified: Secondary | ICD-10-CM | POA: Insufficient documentation

## 2012-07-25 DIAGNOSIS — Z7982 Long term (current) use of aspirin: Secondary | ICD-10-CM | POA: Insufficient documentation

## 2012-07-25 DIAGNOSIS — R350 Frequency of micturition: Secondary | ICD-10-CM | POA: Insufficient documentation

## 2012-07-25 DIAGNOSIS — I10 Essential (primary) hypertension: Secondary | ICD-10-CM | POA: Insufficient documentation

## 2012-07-25 DIAGNOSIS — Z9079 Acquired absence of other genital organ(s): Secondary | ICD-10-CM | POA: Insufficient documentation

## 2012-07-25 HISTORY — DX: Malignant neoplasm of prostate: C61

## 2012-07-25 LAB — URINALYSIS, ROUTINE W REFLEX MICROSCOPIC
Bilirubin Urine: NEGATIVE
Glucose, UA: NEGATIVE mg/dL
Ketones, ur: NEGATIVE mg/dL
Nitrite: NEGATIVE
Protein, ur: >300 mg/dL — AB
Specific Gravity, Urine: 1.026 (ref 1.005–1.030)
Urobilinogen, UA: 1 mg/dL (ref 0.0–1.0)
pH: 6.5 (ref 5.0–8.0)

## 2012-07-25 LAB — URINE MICROSCOPIC-ADD ON

## 2012-07-25 MED ORDER — CEPHALEXIN 500 MG PO CAPS: 500.0000 mg | ORAL_CAPSULE | Freq: Four times a day (QID) | ORAL | Status: DC

## 2012-07-25 NOTE — ED Notes (Signed)
Pt states this morning he went urinate and urinated bright red blood.  Pt denies pain, but states he feels a sense of pressure in his lower abdomen, like he has to urinate constantly.  Pt denies N/V and fever.

## 2012-07-25 NOTE — ED Notes (Signed)
Patient bladder scanned, 31m noted.

## 2012-07-25 NOTE — ED Provider Notes (Signed)
History     CSN: [id number]  Arrival date & time 07/25/12  1304   First MD Initiated Contact with Patient 07/25/12 1320      Chief Complaint  Patient presents with  . Hematuria    (Consider location/radiation/quality/duration/timing/severity/associated sxs/prior treatment) HPI Comments: Patient is a 62 year old male who presents with hematuria since this morning. The symptoms started suddenly and remained constant. Patient noticed the hematuria when urinating this morning. The hematuria remains constant throughout the whole stream and remains constant. He denies pain. Patient reports an episode of hematuria in the past and was diagnosed with a UTI. Patient reports other instances of microscopic hematuria at doctor's visit since his UTI diagnosis in July 2013. No aggravating/alleviating factors. Associated urinary frequency.    Past Medical History  Diagnosis Date  . Hypertension   . Prostate cancer 2006    Past Surgical History  Procedure Laterality Date  . Splenectomy, total    . Prostectomy      No family history on file.  History  Substance Use Topics  . Smoking status: Current Every Day Smoker -- 0.50 packs/day    Types: Cigarettes  . Smokeless tobacco: Never Used  . Alcohol Use: Yes     Comment: weekends      Review of Systems  Genitourinary: Positive for hematuria.  All other systems reviewed and are negative.    Allergies  Review of patient's allergies indicates no known allergies.  Home Medications   Current Outpatient Rx  Name  Route  Sig  Dispense  Refill  . aspirin EC 81 MG tablet   Oral   Take 81 mg by mouth every morning.          . hydrochlorothiazide (HYDRODIURIL) 25 MG tablet   Oral   Take 25 mg by mouth daily.         . metoprolol (LOPRESSOR) 50 MG tablet   Oral   Take 75 mg by mouth every morning.         Marland Kitchen PRESCRIPTION MEDICATION   Intramuscular   Inject 1 Syringe into the muscle as needed. Prostaglandin Injection. For  erectile dysfunction.           BP 138/97  Pulse 88  Resp 17  SpO2 100%  Physical Exam  Nursing note and vitals reviewed. Constitutional: He is oriented to person, place, and time. He appears well-developed and well-nourished. No distress.  HENT:  Head: Normocephalic and atraumatic.  Eyes: Conjunctivae are normal.  Neck: Normal range of motion.  Cardiovascular: Normal rate and regular rhythm.  Exam reveals no gallop and no friction rub.   No murmur heard. Pulmonary/Chest: Effort normal and breath sounds normal. He has no wheezes. He has no rales. He exhibits no tenderness.  Abdominal: Soft. He exhibits no distension. There is no tenderness. There is no rebound and no guarding.  Musculoskeletal: Normal range of motion.  Neurological: He is alert and oriented to person, place, and time. Coordination normal.  Speech is goal-oriented. Moves limbs without ataxia.   Skin: Skin is warm and dry.  Psychiatric: He has a normal mood and affect. His behavior is normal.    ED Course  Procedures (including critical care time)  Labs Reviewed  URINALYSIS, ROUTINE W REFLEX MICROSCOPIC - Abnormal; Notable for the following:    Color, Urine RED (*)    APPearance TURBID (*)    Hgb urine dipstick LARGE (*)    Protein, ur >300 (*)    Leukocytes, UA TRACE (*)  All other components within normal limits  URINE MICROSCOPIC-ADD ON   No results found.   1. UTI (urinary tract infection)   2. Painless hematuria       MDM  1:37 PM Urinalysis pending.   2:51 PM Patient will be treated for UTI and have a recommended Urology follow up. I will treat him with Keflex. Patient instructed to return with worsening or concerning symptoms. Vitals stable at this time.      Alvina Chou, PA-C 07/25/12 1511

## 2012-07-26 LAB — URINE CULTURE
Colony Count: NO GROWTH
Culture: NO GROWTH
Special Requests: NORMAL

## 2012-07-26 NOTE — ED Provider Notes (Signed)
History/physical exam/procedure(s) were performed by non-physician practitioner and as supervising physician I was immediately available for consultation/collaboration. I have reviewed all notes and am in agreement with care and plan.   Shaune Pollack, MD 07/26/12 [phone]

## 2013-02-19 ENCOUNTER — Other Ambulatory Visit: Payer: Self-pay | Admitting: Family Medicine

## 2013-02-19 DIAGNOSIS — R310 Gross hematuria: Secondary | ICD-10-CM

## 2013-02-25 ENCOUNTER — Ambulatory Visit
Admission: RE | Admit: 2013-02-25 | Discharge: 2013-02-25 | Disposition: A | Discharge status: Home / Self Care | Payer: BC Managed Care – PPO | Source: Ambulatory Visit | Attending: Family Medicine | Admitting: Family Medicine

## 2013-02-25 DIAGNOSIS — R310 Gross hematuria: Secondary | ICD-10-CM

## 2013-06-06 ENCOUNTER — Encounter (HOSPITAL_COMMUNITY): Payer: Self-pay | Admitting: Emergency Medicine

## 2013-06-06 ENCOUNTER — Emergency Department (HOSPITAL_COMMUNITY)
Admission: EM | Admit: 2013-06-06 | Discharge: 2013-06-07 | Disposition: A | Discharge status: Home / Self Care | Payer: BC Managed Care – PPO | Attending: Emergency Medicine | Admitting: Emergency Medicine

## 2013-06-06 ENCOUNTER — Emergency Department (HOSPITAL_COMMUNITY): Payer: BC Managed Care – PPO

## 2013-06-06 DIAGNOSIS — Z8546 Personal history of malignant neoplasm of prostate: Secondary | ICD-10-CM | POA: Insufficient documentation

## 2013-06-06 DIAGNOSIS — R059 Cough, unspecified: Secondary | ICD-10-CM | POA: Insufficient documentation

## 2013-06-06 DIAGNOSIS — R110 Nausea: Secondary | ICD-10-CM | POA: Insufficient documentation

## 2013-06-06 DIAGNOSIS — F17200 Nicotine dependence, unspecified, uncomplicated: Secondary | ICD-10-CM | POA: Insufficient documentation

## 2013-06-06 DIAGNOSIS — R0789 Other chest pain: Secondary | ICD-10-CM | POA: Insufficient documentation

## 2013-06-06 DIAGNOSIS — Z79899 Other long term (current) drug therapy: Secondary | ICD-10-CM | POA: Insufficient documentation

## 2013-06-06 DIAGNOSIS — R05 Cough: Secondary | ICD-10-CM | POA: Insufficient documentation

## 2013-06-06 DIAGNOSIS — M2559 Pain in other specified joint: Secondary | ICD-10-CM | POA: Insufficient documentation

## 2013-06-06 DIAGNOSIS — I4891 Unspecified atrial fibrillation: Secondary | ICD-10-CM | POA: Insufficient documentation

## 2013-06-06 DIAGNOSIS — I10 Essential (primary) hypertension: Secondary | ICD-10-CM | POA: Insufficient documentation

## 2013-06-06 DIAGNOSIS — R079 Chest pain, unspecified: Secondary | ICD-10-CM

## 2013-06-06 DIAGNOSIS — M2550 Pain in unspecified joint: Secondary | ICD-10-CM

## 2013-06-06 LAB — COMPREHENSIVE METABOLIC PANEL
ALT: 114 U/L — ABNORMAL HIGH (ref 0–53)
AST: 72 U/L — ABNORMAL HIGH (ref 0–37)
Albumin: 3.6 g/dL (ref 3.5–5.2)
Alkaline Phosphatase: 82 U/L (ref 39–117)
BUN: 15 mg/dL (ref 6–23)
CO2: 23 mEq/L (ref 19–32)
Calcium: 9 mg/dL (ref 8.4–10.5)
Chloride: 100 mEq/L (ref 96–112)
Creatinine, Ser: 0.83 mg/dL (ref 0.50–1.35)
GFR calc Af Amer: >90 mL/min (ref >90)
GFR calc non Af Amer: >90 mL/min (ref >90)
Glucose, Bld: 115 mg/dL — ABNORMAL HIGH (ref 70–99)
Potassium: 3.3 mEq/L — ABNORMAL LOW (ref 3.7–5.3)
Sodium: 137 mEq/L (ref 137–147)
Total Bilirubin: 0.5 mg/dL (ref 0.3–1.2)
Total Protein: 8.1 g/dL (ref 6.0–8.3)

## 2013-06-06 LAB — URINALYSIS, ROUTINE W REFLEX MICROSCOPIC
Bilirubin Urine: NEGATIVE
Glucose, UA: NEGATIVE mg/dL
Hgb urine dipstick: NEGATIVE
Ketones, ur: NEGATIVE mg/dL
Leukocytes, UA: NEGATIVE
Nitrite: NEGATIVE
Protein, ur: NEGATIVE mg/dL
Specific Gravity, Urine: 1.028 (ref 1.005–1.030)
Urobilinogen, UA: 1 mg/dL (ref 0.0–1.0)
pH: 7 (ref 5.0–8.0)

## 2013-06-06 LAB — CBC
HCT: 45.6 % (ref 39.0–52.0)
Hemoglobin: 15.9 g/dL (ref 13.0–17.0)
MCH: 32 pg (ref 26.0–34.0)
MCHC: 34.9 g/dL (ref 30.0–36.0)
MCV: 91.8 fL (ref 78.0–100.0)
Platelets: 180 10*3/uL (ref 150–400)
RBC: 4.97 MIL/uL (ref 4.22–5.81)
RDW: 14.6 % (ref 11.5–15.5)
WBC: 12.5 10*3/uL — ABNORMAL HIGH (ref 4.0–10.5)

## 2013-06-06 LAB — PRO B NATRIURETIC PEPTIDE: Pro B Natriuretic peptide (BNP): 12.9 pg/mL (ref 0–125)

## 2013-06-06 LAB — POCT I-STAT TROPONIN I: Troponin i, poc: 0 ng/mL (ref 0.00–0.08)

## 2013-06-06 LAB — PROTIME-INR
INR: 0.98 (ref 0.00–1.49)
Prothrombin Time: 12.8 seconds (ref 11.6–15.2)

## 2013-06-06 LAB — APTT: aPTT: 31 seconds (ref 24–37)

## 2013-06-06 MED ORDER — GI COCKTAIL ~~LOC~~
30.0000 mL | Freq: Once | ORAL | Status: AC
  Administered 2013-06-07: 30 mL via ORAL

## 2013-06-06 MED ORDER — NITROGLYCERIN 0.4 MG SL SUBL: 0.4000 mg | SUBLINGUAL_TABLET | SUBLINGUAL | Status: DC | PRN

## 2013-06-06 MED ORDER — GI COCKTAIL ~~LOC~~
30.0000 mL | Freq: Once | ORAL | Status: DC
  Filled 2013-06-06: qty 30

## 2013-06-06 MED ORDER — MORPHINE SULFATE 4 MG/ML IJ SOLN
4.0000 mg | Freq: Once | INTRAMUSCULAR | Status: AC
  Administered 2013-06-06: 4 mg via INTRAVENOUS
  Filled 2013-06-06: qty 1

## 2013-06-06 MED ORDER — ASPIRIN 81 MG PO CHEW
324.0000 mg | CHEWABLE_TABLET | Freq: Once | ORAL | Status: AC
  Administered 2013-06-06: 324 mg via ORAL
  Filled 2013-06-06: qty 4

## 2013-06-06 NOTE — ED Provider Notes (Signed)
CSN: [id number]     Arrival date & time 06/06/13  2141 History   First MD Initiated Contact with Patient 06/06/13 2143     Chief Complaint  Patient presents with  . Chest Pain   (Consider location/radiation/quality/duration/timing/severity/associated sxs/prior Treatment) The history is provided by the patient, medical records and a significant other. No language interpreter was used.    Mathew [name] is a 63 y.o. male  with a hx of retention, prostate cancer, A. fib presents to the Emergency Department complaining of gradual, persistent, progressively worsening chest discomfort and heaviness onset this morning.  Patient reports he sees Dr. Luther Parody of cardiology for his A. Fib.  He reports that 3 months ago he was taken off of his aspirin because his urethra with bleeding.  He reports this week he was prescribed nitroglycerin but he's not sure why.  Yesterday he had an episode of chest tightness which resolved immediately with nitroglycerin. He reports when he woke this morning he had chest pressure and dyspnea on exertion.  He reports he took a nitroglycerin that morning which resolved his chest pressure but it returned this evening.  He reports he smokes half a pack of cigarettes per day and has a chronic, dry cough that has been unchanged.  He also reports associated lightheadedness and nausea without syncope or diaphoresis. Nitroglycerin makes his chest pressure better and exertion makes it worse. Patient denies fever, chills, headache, neck pain, abdominal pain, nausea, vomiting, diarrhea, weakness, dizziness, dysuria, hematuria.  Cardiology: [facility])  Past Medical History  Diagnosis Date  . Hypertension   . Prostate cancer 2006   Past Surgical History  Procedure Laterality Date  . Splenectomy, total    . Prostectomy     History reviewed. No pertinent family history. History  Substance Use Topics  . Smoking status: Current Every Day Smoker -- 0.50 packs/day    Types:  Cigarettes  . Smokeless tobacco: Never Used  . Alcohol Use: Yes     Comment: weekends    Review of Systems  Constitutional: Negative for fever, diaphoresis, appetite change, fatigue and unexpected weight change.  HENT: Negative for mouth sores.   Eyes: Negative for visual disturbance.  Respiratory: Positive for chest tightness and shortness of breath. Negative for cough and wheezing.   Cardiovascular: Positive for chest pain.  Gastrointestinal: Negative for nausea, vomiting, abdominal pain, diarrhea and constipation.  Endocrine: Negative for polydipsia, polyphagia and polyuria.  Genitourinary: Negative for dysuria, urgency, frequency and hematuria.  Musculoskeletal: Positive for arthralgias (LUE). Negative for back pain and neck stiffness.  Skin: Negative for rash.  Allergic/Immunologic: Negative for immunocompromised state.  Neurological: Negative for syncope, light-headedness and headaches.  Hematological: Does not bruise/bleed easily.  Psychiatric/Behavioral: Negative for sleep disturbance. The patient is not nervous/anxious.     Allergies  Review of patient's allergies indicates no known allergies.  Home Medications   Current Outpatient Rx  Name  Route  Sig  Dispense  Refill  . hydrochlorothiazide (HYDRODIURIL) 25 MG tablet   Oral   Take 25 mg by mouth daily.         . metoprolol (LOPRESSOR) 50 MG tablet   Oral   Take 75 mg by mouth every morning.         Marland Kitchen PRESCRIPTION MEDICATION   Intramuscular   Inject 1 Syringe into the muscle as needed. Prostaglandin Injection. For erectile dysfunction.          BP 147/90  Pulse 78  Temp(Src) 98.1 F (36.7 C) (Oral)  Resp 14  SpO2 94% Physical Exam  Nursing note and vitals reviewed. Constitutional: He is oriented to person, place, and time. He appears well-developed and well-nourished. No distress.  Awake, alert, nontoxic appearance  HENT:  Head: Normocephalic and atraumatic.  Mouth/Throat: Oropharynx is clear and  moist. No oropharyngeal exudate.  Eyes: Conjunctivae are normal. No scleral icterus.  Neck: Normal range of motion. Neck supple.  Cardiovascular: Normal rate, regular rhythm, normal heart sounds and intact distal pulses.   No murmur heard. Regular rate and rhythm No murmur  Pulmonary/Chest: Effort normal and breath sounds normal. No respiratory distress. He has no wheezes. He has no rales. He exhibits no tenderness.  Clear and equal breath sounds throughout No tenderness to palpation of the anterior chest  Abdominal: Soft. Bowel sounds are normal. He exhibits no distension and no mass. There is no tenderness. There is no rebound and no guarding.  Abdomen soft and nontender No CVA tenderness  Musculoskeletal: Normal range of motion. He exhibits no edema.  No peripheral edema No tenderness to palpation of the left shoulder or arm Full range of motion of the left shoulder and all joints of the left approximately - range of motion does not elicit pain  Lymphadenopathy:    He has no cervical adenopathy.  Neurological: He is alert and oriented to person, place, and time. He exhibits normal muscle tone. Coordination normal.  Speech is clear and goal oriented Moves extremities without ataxia  Skin: Skin is warm and dry. He is not diaphoretic. No erythema.  Psychiatric: He has a normal mood and affect.    ED Course  Procedures (including critical care time) Labs Review Labs Reviewed  CBC - Abnormal; Notable for the following:    WBC 12.5 (*)    All other components within normal limits  COMPREHENSIVE METABOLIC PANEL - Abnormal; Notable for the following:    Potassium 3.3 (*)    Glucose, Bld 115 (*)    AST 72 (*)    ALT 114 (*)    All other components within normal limits  URINALYSIS, ROUTINE W REFLEX MICROSCOPIC  APTT  PROTIME-INR  PRO B NATRIURETIC PEPTIDE  POCT I-STAT TROPONIN I  POCT I-STAT TROPONIN I   Imaging Review Dg Chest 2 View  06/06/2013   CLINICAL DATA:  Chest pain.   History of prostate cancer.  EXAM: CHEST  2 VIEW  COMPARISON:  PA and lateral chest 01/20/2010  FINDINGS: The lungs are clear. Heart size is normal. No pneumothorax or pleural effusion. No focal bony abnormality.  IMPRESSION: No acute disease.   Electronically Signed   By: Inge Rise M.D.   On: 06/06/2013 22:26    EKG Interpretation    Date/Time:  Sunday June 06 2013 21:49:23 EST Ventricular Rate:  87 PR Interval:  187 QRS Duration: 80 QT Interval:  364 QTC Calculation: 438 R Axis:   50 Text Interpretation:  Sinus rhythm Probable left atrial enlargement No significant change was found Confirmed by CAMPOS  MD, KEVIN ([id number]) on 06/06/2013 9:52:23 PM            ECG: 06/07/13 @ 00:36  Date: 06/07/2013  Rate: 72  Rhythm: normal sinus rhythm  QRS Axis: normal  Intervals: normal  ST/T Wave abnormalities: normal  Conduction Disutrbances:none  Narrative Interpretation: L atrial enlargement; repeat ECG remains nonischemic  Old EKG Reviewed: unchanged from 06/07/13 @ 21:49    MDM   1. Chest pain      Mathew [name] presents for chest pain  and dyspnea on exertion. Denies history of MI and angina but has nitroglycerin prescription from cardiology.  Will begin ACS work-up and give ASA, nitro and morphine.    Patient ECG without A. fib and nonischemic.  11:30PM Troponin negative, urinalysis without evidence of urinary tract infection. CBC with mild leukocytosis of 12.5 and CMP with mild hypokalemia replaced here in the department. BNP 12.9. Mild elevation of transaminases.  Chest x-ray without evidence of pneumonia completely edema or pneumothorax. Will cycle enzymes and repeat EKG. If no changes patient be discharged home to followup with Dr. Montez Morita.  1:12 AM Repeat troponin remains negative and repeat EKG remains nonischemic without changes from initial tonight.  Patient reports complete relief of his chest discomfort and is rated at discharged home. He remains alert, oriented,  nontoxic and nonseptic appearing. He is afebrile, non-tachycardic and not hypoxic.  Discussed the importance of close followup with doctors Pearl and reasons to return immediately here to the emergency department including worsening or concerning symptoms.  It has been determined that no acute conditions requiring further emergency intervention are present at this time. The patient/guardian have been advised of the diagnosis and plan. We have discussed signs and symptoms that warrant return to the ED, such as changes or worsening in symptoms.   Vital signs are stable at discharge.   BP 158/98  Pulse 83  Temp(Src) 98.1 F (36.7 C) (Oral)  Resp 20  SpO2 96%  Patient/guardian has voiced understanding and agreed to follow-up with the PCP or specialist.    The patient was discussed with and seen by Dr. Venora Maples who agrees with the treatment plan.    Jarrett Soho Junie Engram, PA-C 06/07/13 [phone]

## 2013-06-06 NOTE — ED Notes (Signed)
Pt arrived to the ED with a complaint of chest pain.  Pt has a hx of a fib.  Pt states his chest pain feels like his heart was getting bigger.  Radiation of the pain to the left arm.  Pt took a nitro prior to arrival to ED.

## 2013-06-07 ENCOUNTER — Other Ambulatory Visit (HOSPITAL_COMMUNITY): Payer: Self-pay | Admitting: Cardiology

## 2013-06-07 DIAGNOSIS — I4891 Unspecified atrial fibrillation: Secondary | ICD-10-CM

## 2013-06-07 LAB — POCT I-STAT TROPONIN I: Troponin i, poc: 0.01 ng/mL (ref 0.00–0.08)

## 2013-06-07 NOTE — Discharge Instructions (Signed)
1. Medications: usual home medications, including nitro for chest pressure 2. Treatment: rest, drink plenty of fluids,  3. Follow Up: Please followup with Dr. Montez Morita tomorrow for further evaluation; return to the emergency department for worsening or concerning symptoms.   Chest Pain (Nonspecific) It is often hard to give a specific diagnosis for the cause of chest pain. There is always a chance that your pain could be related to something serious, such as a heart attack or a blood clot in the lungs. You need to follow up with your caregiver for further evaluation. CAUSES   Heartburn.  Pneumonia or bronchitis.  Anxiety or stress.  Inflammation around your heart (pericarditis) or lung (pleuritis or pleurisy).  A blood clot in the lung.  A collapsed lung (pneumothorax). It can develop suddenly on its own (spontaneous pneumothorax) or from injury (trauma) to the chest.  Shingles infection (herpes zoster virus). The chest wall is composed of bones, muscles, and cartilage. Any of these can be the source of the pain.  The bones can be bruised by injury.  The muscles or cartilage can be strained by coughing or overwork.  The cartilage can be affected by inflammation and become sore (costochondritis). DIAGNOSIS  Lab tests or other studies, such as X-rays, electrocardiography, stress testing, or cardiac imaging, may be needed to find the cause of your pain.  TREATMENT   Treatment depends on what may be causing your chest pain. Treatment may include:  Acid blockers for heartburn.  Anti-inflammatory medicine.  Pain medicine for inflammatory conditions.  Antibiotics if an infection is present.  You may be advised to change lifestyle habits. This includes stopping smoking and avoiding alcohol, caffeine, and chocolate.  You may be advised to keep your head raised (elevated) when sleeping. This reduces the chance of acid going backward from your stomach into your esophagus.  Most of  the time, nonspecific chest pain will improve within 2 to 3 days with rest and mild pain medicine. HOME CARE INSTRUCTIONS   If antibiotics were prescribed, take your antibiotics as directed. Finish them even if you start to feel better.  For the next few days, avoid physical activities that bring on chest pain. Continue physical activities as directed.  Do not smoke.  Avoid drinking alcohol.  Only take over-the-counter or prescription medicine for pain, discomfort, or fever as directed by your caregiver.  Follow your caregiver's suggestions for further testing if your chest pain does not go away.  Keep any follow-up appointments you made. If you do not go to an appointment, you could develop lasting (chronic) problems with pain. If there is any problem keeping an appointment, you must call to reschedule. SEEK MEDICAL CARE IF:   You think you are having problems from the medicine you are taking. Read your medicine instructions carefully.  Your chest pain does not go away, even after treatment.  You develop a rash with blisters on your chest. SEEK IMMEDIATE MEDICAL CARE IF:   You have increased chest pain or pain that spreads to your arm, neck, jaw, back, or abdomen.  You develop shortness of breath, an increasing cough, or you are coughing up blood.  You have severe back or abdominal pain, feel nauseous, or vomit.  You develop severe weakness, fainting, or chills.  You have a fever. THIS IS AN EMERGENCY. Do not wait to see if the pain will go away. Get medical help at once. Call your local emergency services (911 in [place] Do not drive yourself to the hospital.  MAKE SURE YOU:   Understand these instructions.  Will watch your condition.  Will get help right away if you are not doing well or get worse. Document Released: 01/30/2005 Document Revised: 07/15/2011 Document Reviewed: 11/26/2007 [facility] Patient Information 2014 [place].

## 2013-06-08 ENCOUNTER — Ambulatory Visit (HOSPITAL_COMMUNITY)
Admission: RE | Admit: 2013-06-08 | Discharge: 2013-06-08 | Disposition: A | Discharge status: Home / Self Care | Payer: BC Managed Care – PPO | Source: Ambulatory Visit | Attending: Cardiology | Admitting: Cardiology

## 2013-06-08 ENCOUNTER — Other Ambulatory Visit: Payer: Self-pay

## 2013-06-08 DIAGNOSIS — R079 Chest pain, unspecified: Secondary | ICD-10-CM | POA: Insufficient documentation

## 2013-06-08 DIAGNOSIS — I4891 Unspecified atrial fibrillation: Secondary | ICD-10-CM | POA: Insufficient documentation

## 2013-06-08 MED ORDER — TECHNETIUM TC 99M SESTAMIBI - CARDIOLITE
30.0000 | Freq: Once | INTRAVENOUS | Status: AC | PRN
  Administered 2013-06-08: 30 via INTRAVENOUS

## 2013-06-08 MED ORDER — REGADENOSON 0.4 MG/5ML IV SOLN
INTRAVENOUS | Status: AC
  Filled 2013-06-08: qty 5

## 2013-06-08 MED ORDER — TECHNETIUM TC 99M SESTAMIBI GENERIC - CARDIOLITE
10.0000 | Freq: Once | INTRAVENOUS | Status: AC | PRN
  Administered 2013-06-08: 10 via INTRAVENOUS

## 2013-06-08 MED ORDER — REGADENOSON 0.4 MG/5ML IV SOLN
0.4000 mg | Freq: Once | INTRAVENOUS | Status: AC
  Administered 2013-06-08: 0.4 mg via INTRAVENOUS

## 2013-06-08 NOTE — ED Provider Notes (Signed)
Medical screening examination/treatment/procedure(s) were conducted as a shared visit with non-physician practitioner(s) and myself.  I personally evaluated the patient during the encounter.  EKG Interpretation    Date/Time:  Sunday June 06 2013 21:49:23 EST Ventricular Rate:  87 PR Interval:  187 QRS Duration: 80 QT Interval:  364 QTC Calculation: 438 R Axis:   50 Text Interpretation:  Sinus rhythm Probable left atrial enlargement No significant change was found Confirmed by Genesee Nase  MD, Andalyn Heckstall ([id number]) on 06/06/2013 9:52:23 PM            Patient is well appearing.  Is an atypical presentation for ACS.  EKG and troponin negative x2.  Patient will follow back up with his cardiologist Dr. Montez Morita.  Suspect more GI related symptoms.    Hoy Morn, MD 06/08/13 [phone]

## 2013-06-14 ENCOUNTER — Encounter (HOSPITAL_COMMUNITY): Payer: BC Managed Care – PPO

## 2013-06-14 ENCOUNTER — Ambulatory Visit (HOSPITAL_COMMUNITY): Payer: BC Managed Care – PPO

## 2013-07-16 ENCOUNTER — Other Ambulatory Visit: Payer: Self-pay | Admitting: Family Medicine

## 2013-07-16 DIAGNOSIS — G8929 Other chronic pain: Secondary | ICD-10-CM

## 2013-07-16 DIAGNOSIS — M25512 Pain in left shoulder: Secondary | ICD-10-CM

## 2013-07-16 DIAGNOSIS — M542 Cervicalgia: Principal | ICD-10-CM

## 2013-07-22 ENCOUNTER — Ambulatory Visit
Admission: RE | Admit: 2013-07-22 | Discharge: 2013-07-22 | Disposition: A | Discharge status: Home / Self Care | Payer: BC Managed Care – PPO | Source: Ambulatory Visit | Attending: Family Medicine | Admitting: Family Medicine

## 2013-07-22 DIAGNOSIS — M542 Cervicalgia: Principal | ICD-10-CM

## 2013-07-22 DIAGNOSIS — G8929 Other chronic pain: Secondary | ICD-10-CM

## 2013-07-22 DIAGNOSIS — M25512 Pain in left shoulder: Secondary | ICD-10-CM

## 2013-07-22 MED ORDER — GADOBENATE DIMEGLUMINE 529 MG/ML IV SOLN
16.0000 mL | Freq: Once | INTRAVENOUS | Status: AC | PRN
  Administered 2013-07-22: 16 mL via INTRAVENOUS

## 2014-06-10 ENCOUNTER — Ambulatory Visit: Payer: Self-pay | Admitting: Cardiology

## 2014-06-15 ENCOUNTER — Encounter: Payer: Self-pay | Admitting: Cardiology

## 2014-08-09 ENCOUNTER — Other Ambulatory Visit (HOSPITAL_COMMUNITY)
Admission: RE | Admit: 2014-08-09 | Discharge: 2014-08-09 | Disposition: A | Discharge status: Home / Self Care | Payer: BLUE CROSS/BLUE SHIELD | Source: Ambulatory Visit | Attending: Family Medicine | Admitting: Family Medicine

## 2014-08-09 ENCOUNTER — Other Ambulatory Visit: Payer: BLUE CROSS/BLUE SHIELD | Admitting: Family Medicine

## 2014-08-09 DIAGNOSIS — Z113 Encounter for screening for infections with a predominantly sexual mode of transmission: Secondary | ICD-10-CM | POA: Diagnosis not present

## 2014-08-12 LAB — URINE CYTOLOGY ANCILLARY ONLY
Bacterial vaginitis: POSITIVE — AB
Candida vaginitis: NEGATIVE
Chlamydia: NEGATIVE
Neisseria Gonorrhea: NEGATIVE
Trichomonas: NEGATIVE

## 2014-08-23 ENCOUNTER — Other Ambulatory Visit (HOSPITAL_COMMUNITY): Payer: Self-pay | Admitting: Nurse Practitioner

## 2014-08-23 DIAGNOSIS — B182 Chronic viral hepatitis C: Secondary | ICD-10-CM

## 2014-09-20 ENCOUNTER — Ambulatory Visit (HOSPITAL_COMMUNITY): Payer: BLUE CROSS/BLUE SHIELD

## 2014-10-12 ENCOUNTER — Ambulatory Visit (HOSPITAL_COMMUNITY)
Admission: RE | Admit: 2014-10-12 | Discharge: 2014-10-12 | Disposition: A | Discharge status: Home / Self Care | Payer: 59 | Source: Ambulatory Visit | Attending: Nurse Practitioner | Admitting: Nurse Practitioner

## 2014-10-12 DIAGNOSIS — B182 Chronic viral hepatitis C: Secondary | ICD-10-CM | POA: Diagnosis not present

## 2014-10-12 DIAGNOSIS — K7291 Hepatic failure, unspecified with coma: Secondary | ICD-10-CM | POA: Insufficient documentation

## 2014-11-15 ENCOUNTER — Other Ambulatory Visit: Payer: Self-pay | Admitting: Nurse Practitioner

## 2014-11-15 DIAGNOSIS — N289 Disorder of kidney and ureter, unspecified: Secondary | ICD-10-CM

## 2014-11-21 ENCOUNTER — Ambulatory Visit
Admission: RE | Admit: 2014-11-21 | Discharge: 2014-11-21 | Disposition: A | Discharge status: Home / Self Care | Payer: 59 | Source: Ambulatory Visit | Attending: Nurse Practitioner | Admitting: Nurse Practitioner

## 2014-11-21 DIAGNOSIS — N289 Disorder of kidney and ureter, unspecified: Secondary | ICD-10-CM

## 2014-11-21 MED ORDER — GADOXETATE DISODIUM 0.25 MMOL/ML IV SOLN
9.0000 mL | Freq: Once | INTRAVENOUS | Status: AC | PRN
  Administered 2014-11-21: 9 mL via INTRAVENOUS

## 2015-07-10 ENCOUNTER — Other Ambulatory Visit (HOSPITAL_COMMUNITY): Payer: Self-pay | Admitting: Physician Assistant

## 2015-07-10 DIAGNOSIS — B182 Chronic viral hepatitis C: Secondary | ICD-10-CM

## 2015-08-09 ENCOUNTER — Ambulatory Visit (HOSPITAL_COMMUNITY)
Admission: RE | Admit: 2015-08-09 | Discharge: 2015-08-09 | Disposition: A | Discharge status: Home / Self Care | Payer: 59 | Source: Ambulatory Visit | Attending: Physician Assistant | Admitting: Physician Assistant

## 2015-08-09 DIAGNOSIS — N281 Cyst of kidney, acquired: Secondary | ICD-10-CM | POA: Insufficient documentation

## 2015-08-09 DIAGNOSIS — B182 Chronic viral hepatitis C: Secondary | ICD-10-CM

## 2015-08-09 DIAGNOSIS — Z9081 Acquired absence of spleen: Secondary | ICD-10-CM | POA: Insufficient documentation

## 2015-12-25 ENCOUNTER — Encounter (HOSPITAL_COMMUNITY): Payer: Self-pay | Admitting: Nurse Practitioner

## 2015-12-25 ENCOUNTER — Emergency Department (HOSPITAL_COMMUNITY)
Admission: EM | Admit: 2015-12-25 | Discharge: 2015-12-26 | Disposition: A | Discharge status: Home / Self Care | Payer: 59 | Attending: Emergency Medicine | Admitting: Emergency Medicine

## 2015-12-25 ENCOUNTER — Emergency Department (HOSPITAL_COMMUNITY): Payer: 59

## 2015-12-25 DIAGNOSIS — F17210 Nicotine dependence, cigarettes, uncomplicated: Secondary | ICD-10-CM | POA: Insufficient documentation

## 2015-12-25 DIAGNOSIS — R079 Chest pain, unspecified: Secondary | ICD-10-CM | POA: Diagnosis present

## 2015-12-25 DIAGNOSIS — Z8546 Personal history of malignant neoplasm of prostate: Secondary | ICD-10-CM | POA: Diagnosis not present

## 2015-12-25 DIAGNOSIS — I10 Essential (primary) hypertension: Secondary | ICD-10-CM | POA: Insufficient documentation

## 2015-12-25 DIAGNOSIS — R002 Palpitations: Secondary | ICD-10-CM

## 2015-12-25 DIAGNOSIS — E876 Hypokalemia: Secondary | ICD-10-CM | POA: Diagnosis not present

## 2015-12-25 LAB — BASIC METABOLIC PANEL
Anion gap: 5 (ref 5–15)
BUN: 8 mg/dL (ref 6–20)
CO2: 25 mmol/L (ref 22–32)
Calcium: 9.1 mg/dL (ref 8.9–10.3)
Chloride: 103 mmol/L (ref 101–111)
Creatinine, Ser: 1.02 mg/dL (ref 0.61–1.24)
GFR calc Af Amer: >60 mL/min (ref >60)
GFR calc non Af Amer: >60 mL/min (ref >60)
Glucose, Bld: 130 mg/dL — ABNORMAL HIGH (ref 65–99)
Potassium: 3.2 mmol/L — ABNORMAL LOW (ref 3.5–5.1)
Sodium: 133 mmol/L — ABNORMAL LOW (ref 135–145)

## 2015-12-25 LAB — I-STAT TROPONIN, ED: Troponin i, poc: 0 ng/mL (ref 0.00–0.08)

## 2015-12-25 LAB — CBC
HCT: 46.2 % (ref 39.0–52.0)
Hemoglobin: 15.6 g/dL (ref 13.0–17.0)
MCH: 30.5 pg (ref 26.0–34.0)
MCHC: 33.8 g/dL (ref 30.0–36.0)
MCV: 90.4 fL (ref 78.0–100.0)
Platelets: 204 10*3/uL (ref 150–400)
RBC: 5.11 MIL/uL (ref 4.22–5.81)
RDW: 14.7 % (ref 11.5–15.5)
WBC: 12.9 10*3/uL — ABNORMAL HIGH (ref 4.0–10.5)

## 2015-12-25 MED ORDER — POTASSIUM CHLORIDE CRYS ER 20 MEQ PO TBCR
40.0000 meq | EXTENDED_RELEASE_TABLET | Freq: Once | ORAL | Status: AC
  Administered 2015-12-25: 40 meq via ORAL
  Filled 2015-12-25: qty 2

## 2015-12-25 MED ORDER — SODIUM CHLORIDE 0.9 % IV BOLUS (SEPSIS)
500.0000 mL | Freq: Once | INTRAVENOUS | Status: AC
Start: 2015-12-25 — End: 2015-12-26
  Administered 2015-12-25: 500 mL via INTRAVENOUS

## 2015-12-25 NOTE — ED Triage Notes (Signed)
He c/o CP, dizziness, and palpitations onset this afternoon. He c/o feeling nervous and jittery throughout the day today, which he attributed to drinking coffee. Symptoms have persisted since onset. He denies SOB, cough, weakness, fevers. He is alert and breathing easily.

## 2015-12-25 NOTE — ED Notes (Addendum)
Provider at the bedside.  

## 2015-12-25 NOTE — ED Provider Notes (Signed)
[facility] DEPT Provider Note   CSN: [id number] Arrival date & time: 12/25/15  1848     History   Chief Complaint Chief Complaint  Patient presents with  . Chest Pain    HPI Mathew [name] is a 65 y.o. male.  Mathew [name] is a 65 y.o. male  with a hx of HTN, afib, presents to the Emergency Department complaining of acute and sudden onset of palpitations with associated SOB, CP, lightheadedness, facial numbness and near syncope onset 3pm this afternoon.  Pt reports symptoms lasted approx 1-2 minutes and then improved.  Pt reports the palpitations and "uneasy" feeling in his chest has persisted.  Pt denies nausea, diaphoresis, fever, chills, cough, abd pain, vomiting, diarrhea, weakness, full syncope.  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  No leg swelling, calf tenderness, hx of DVT.  Pt reports he is a smoker.  He drinks 1 large cup of coffee per day and did have 1 today, but did not have additional caffeine.     The history is provided by the patient and medical records. No language interpreter was used.  Chest Pain  Associated symptoms include palpitations and shortness of breath.    Past Medical History:  Diagnosis Date  . Hypertension   . Prostate cancer ([place]) 2006    There are no active problems to display for this patient.   Past Surgical History:  Procedure Laterality Date  . prostectomy    . SPLENECTOMY, TOTAL         Home Medications    Prior to Admission medications   Medication Sig Start Date End Date Taking? Authorizing Provider  losartan-hydrochlorothiazide (HYZAAR) 100-25 MG tablet Take 1 tablet by mouth daily.   Yes Historical Provider, MD  metoprolol (LOPRESSOR) 50 MG tablet Take 75 mg by mouth every morning.   Yes Historical Provider, MD    Family History History reviewed. No pertinent family history.  Social History Social History  Substance Use Topics  . Smoking status: Current Every Day Smoker    Packs/day: 0.50    Types:  Cigarettes  . Smokeless tobacco: Never Used  . Alcohol use Yes     Comment: weekends     Allergies   Review of patient's allergies indicates no known allergies.   Review of Systems Review of Systems  Respiratory: Positive for shortness of breath.   Cardiovascular: Positive for chest pain and palpitations.  Neurological:       Near syncope  All other systems reviewed and are negative.    Physical Exam Updated Vital Signs BP (!) 148/102   Pulse 71   Temp 98.1 F (36.7 C) (Oral)   Resp 18   SpO2 96%   Physical Exam  Constitutional: He appears well-developed and well-nourished. No distress.  Awake, alert, nontoxic appearance  HENT:  Head: Normocephalic and atraumatic.  Mouth/Throat: Oropharynx is clear and moist. No oropharyngeal exudate.  Eyes: Conjunctivae are normal. No scleral icterus.  Neck: Normal range of motion. Neck supple.  Cardiovascular: Normal rate and intact distal pulses.  An irregular rhythm present.  No murmur heard. Pulses:      Radial pulses are 2+ on the right side, and 2+ on the left side.       Dorsalis pedis pulses are 2+ on the right side, and 2+ on the left side.  Pulmonary/Chest: Effort normal and breath sounds normal. No respiratory distress. He has no wheezes.  Equal chest expansion  Abdominal: Soft. Bowel sounds are normal. He  exhibits no mass. There is no tenderness. There is no rebound and no guarding.  Musculoskeletal: Normal range of motion. He exhibits no edema.  Neurological: He is alert.  Speech is clear and goal oriented Moves extremities without ataxia  Skin: Skin is warm and dry. He is not diaphoretic.  Psychiatric: He has a normal mood and affect.  Nursing note and vitals reviewed.    ED Treatments / Results  Labs (all labs ordered are listed, but only abnormal results are displayed) Labs Reviewed  BASIC METABOLIC PANEL - Abnormal; Notable for the following:       Result Value   Sodium 133 (*)    Potassium 3.2 (*)     Glucose, Bld 130 (*)    All other components within normal limits  CBC - Abnormal; Notable for the following:    WBC 12.9 (*)    All other components within normal limits  I-STAT TROPOININ, ED  I-STAT TROPOININ, ED    EKG  EKG Interpretation  Date/Time:  Monday December 25 2015 18:54:08 EDT Ventricular Rate:  89 PR Interval:  178 QRS Duration: 100 QT Interval:  364 QTC Calculation: 442 R Axis:   32 Text Interpretation:  Normal sinus rhythm Normal ECG No acute changes No significant change since last tracing Confirmed by Kathrynn Humble, MD, Thelma Comp [phone]) on 12/25/2015 10:53:01 PM       Radiology Dg Chest 2 View  Result Date: 12/25/2015 CLINICAL DATA:  Dizziness and chest pain. EXAM: CHEST  2 VIEW COMPARISON:  06/06/2013 FINDINGS: Chronic interstitial coarsening which may be lead to patient's smoking history. There is no edema, consolidation, effusion, or pneumothorax. Normal heart size and mediastinal contours. No acute osseous finding. IMPRESSION: Chronic interstitial coarsening, likely bronchitic. No acute finding when compared to prior. Electronically Signed   By: Monte Fantasia M.D.   On: 12/25/2015 19:36    Procedures Procedures (including critical care time)  Medications Ordered in ED Medications  sodium chloride 0.9 % bolus 500 mL (0 mLs Intravenous Stopped 12/26/15 0001)  potassium chloride SA (K-DUR,KLOR-CON) CR tablet 40 mEq (40 mEq Oral Given 12/25/15 2253)     Initial Impression / Assessment and Plan / ED Course  I have reviewed the triage vital signs and the nursing notes.  Pertinent labs & imaging results that were available during my care of the patient were reviewed by me and considered in my medical decision making (see chart for details).  Clinical Course  Value Comment By Time  Potassium: (!) 3.2 Hypokalemia, repleated in ED [facility], PA-C 08/21 2245   Pt with irregular HR; monitor shows intermittent PACs and multifocal PVCs only some of which are  conducting.  Pt reports he continues to feel uneasy in his chest Abigail Butts, PA-C 08/21 2245  DG Chest 2 View No PNA or pulmonary edema Abigail Butts, PA-C 08/21 2246  ED EKG within 10 minutes NSR without ischemia [facility], PA-C 08/21 2246  WBC: (!) 12.9 Leukocytosis Amsi Grimley, PA-C 08/21 2246  Troponin i, poc: 0.00 Repeat troponin negative Abigail Butts, PA-C 08/22 0135    Pt With palpitations. Evidence of PACs and multifocal PVCs on exam. Patient given fluids and potassium. He reports that his symptoms have resolved I see no more ectopic beats on his rhythm strip. He already has an appointment with his cardiologist tomorrow morning. Initial and delta troponin are negative. No changes to his EKG. Patient reports a history of nuclear stress test however I am unable to find one in the  record.  Patient will be discharged home for follow-up with cardiology as to the morning. He is to return to the emergency department if his symptoms return.  The patient was discussed with and seen by Dr. Kathrynn Humble who agrees with the treatment plan.   Final Clinical Impressions(s) / ED Diagnoses   Final diagnoses:  Palpitations  Hypokalemia    New Prescriptions New Prescriptions   No medications on file     Abigail Butts, PA-C 12/26/15 2820    Varney Biles, MD 12/26/15 0201

## 2015-12-26 LAB — I-STAT TROPONIN, ED: Troponin i, poc: 0 ng/mL (ref 0.00–0.08)

## 2015-12-26 NOTE — Discharge Instructions (Signed)
1. Medications: usual home medications 2. Treatment: rest, drink plenty of fluids,  3. Follow Up: Please followup with cardiology at your appointment tomorrow days for discussion of your diagnoses and further evaluation after today's visit; if you do not have a primary care doctor use the resource guide provided to find one; Please return to the ER for symptoms, development of chest pain, shortness of breath or other concerns.

## 2016-01-12 ENCOUNTER — Other Ambulatory Visit: Payer: Self-pay | Admitting: Nurse Practitioner

## 2016-01-12 DIAGNOSIS — K7400 Hepatic fibrosis, unspecified: Secondary | ICD-10-CM

## 2016-01-17 ENCOUNTER — Encounter: Payer: Self-pay | Admitting: Interventional Cardiology

## 2016-01-31 ENCOUNTER — Encounter (INDEPENDENT_AMBULATORY_CARE_PROVIDER_SITE_OTHER): Payer: Self-pay

## 2016-01-31 ENCOUNTER — Encounter: Payer: Self-pay | Admitting: Interventional Cardiology

## 2016-01-31 ENCOUNTER — Encounter: Payer: 59 | Admitting: Interventional Cardiology

## 2016-01-31 NOTE — Progress Notes (Signed)
   This encounter was created in error - please disregard.

## 2016-02-19 ENCOUNTER — Encounter (INDEPENDENT_AMBULATORY_CARE_PROVIDER_SITE_OTHER): Payer: Self-pay

## 2016-02-19 ENCOUNTER — Ambulatory Visit (INDEPENDENT_AMBULATORY_CARE_PROVIDER_SITE_OTHER): Payer: 59 | Admitting: Internal Medicine

## 2016-02-19 ENCOUNTER — Encounter: Payer: Self-pay | Admitting: Internal Medicine

## 2016-02-19 VITALS — BP 154/104 | HR 77 | Ht 75.0 in | Wt 207.0 lb

## 2016-02-19 DIAGNOSIS — I4891 Unspecified atrial fibrillation: Secondary | ICD-10-CM

## 2016-02-19 DIAGNOSIS — I480 Paroxysmal atrial fibrillation: Secondary | ICD-10-CM | POA: Diagnosis not present

## 2016-02-19 DIAGNOSIS — I10 Essential (primary) hypertension: Secondary | ICD-10-CM | POA: Diagnosis not present

## 2016-02-19 DIAGNOSIS — I493 Ventricular premature depolarization: Secondary | ICD-10-CM

## 2016-02-19 MED ORDER — AMLODIPINE BESYLATE 5 MG PO TABS: 5.0000 mg | ORAL_TABLET | Freq: Every day | ORAL | 3 refills | Status: DC

## 2016-02-19 NOTE — Patient Instructions (Signed)
Medication Instructions:  Your physician has recommended you make the following change in your medication:  1) Start Amlodipine 5 mg daily   Labwork: None ordered   Testing/Procedures: None ordered   Follow-Up: Your physician recommends that you schedule a follow-up appointment in: 4 weeks with Dr Berdine Addison and 2 months with Dr Rayann Heman    Any Other Special Instructions Will Be Listed Below (If Applicable).     If you need a refill on your cardiac medications before your next appointment, please call your pharmacy.

## 2016-02-19 NOTE — Progress Notes (Signed)
Electrophysiology Office Note   Date:  02/19/2016   ID:  Ankush Gintz, DOB 1950-05-31, MRN [id number]  PCP:  Maggie Font, MD  Cardiologist:  Dr Josie Dixon Primary Electrophysiologist: Thompson Grayer, MD    CC: afib   History of Present Illness: Mathew [name] is a 65 y.o. male who presents today for electrophysiology evaluation.   He reports initially being diagnosed with atrial fibrillation 07/22/2006 after developing irregular palpitations at work.  EMS was called and ekg revealed atrial fibrillation.  He was brought to [facility] and admitted by Dr Radford Pax.  EKGs are not available for review.  He has occasional "skipped beats" and is occasionally aware of his heart beat and is unaware of any further atrial fibrillation.  He presented to [facility] ED 12/25/15 with an "uneasiness" and palpitations.  This lasted several minutes and resolved by the time he arrived at the ER.  He takes metoprolol  He has tried to reduce his coffee intake and finds that with this, he feels "less jittery".  He has worn an event monitor which revealed only PVCs.  Echo reveals moderate septal LVH.  He has reduce metoprolol and losartan and feels "better"  Today, he denies symptoms of chest pain, orthopnea, PND, lower extremity edema, claudication, dizziness, presyncope, syncope, bleeding, or neurologic sequela.  He has chronic SOB which he attributes to tobacco.  The patient is tolerating medications without difficulties and is otherwise without complaint today.    Past Medical History:  Diagnosis Date  . Hepatitis C    s/p treatment  . Hypertension   . Obstructive sleep apnea    unable to tolerate CPAP  . Premature ventricular contraction   . Prostate cancer ([place]) 2006   Past Surgical History:  Procedure Laterality Date  . prostectomy    . SPLENECTOMY, TOTAL       Current Outpatient Prescriptions  Medication Sig Dispense Refill  . losartan-hydrochlorothiazide (HYZAAR) 100-25 MG tablet Take 1 tablet by  mouth daily.    . metoprolol (LOPRESSOR) 50 MG tablet Take 75 mg by mouth every morning.     No current facility-administered medications for this visit.     Allergies:   Review of patient's allergies indicates no known allergies.   Social History:  The patient  reports that he has been smoking Cigarettes.  He has been smoking about 0.50 packs per day. He has never used smokeless tobacco. He reports that he drinks alcohol. He reports that he does not use drugs.   Family History:  The patient's  FH includes hypertension.  Sister died of cancer.  Brother died of IVDA and HIV.   He is unaware of any other FH of sudden death.   ROS:  Please see the history of present illness.   All other systems are reviewed and negative.    PHYSICAL EXAM: VS:  BP (!) 154/104   Pulse 77   Ht _0  (1.905 m)   Wt 207 lb (93.9 kg)   BMI 25.87 kg/m  , BMI Body mass index is 25.87 kg/m. GEN: Well nourished, well developed, in no acute distress  HEENT: normal  Neck: no JVD, carotid bruits, or masses Cardiac: RRR; no murmurs, rubs, or gallops,no edema  Respiratory:  clear to auscultation bilaterally, normal work of breathing GI: soft, nontender, nondistended, + BS MS: no deformity or atrophy  Skin: warm and dry  Neuro:  Strength and sensation are intact Psych: euthymic mood, full affect  EKG:  EKG is ordered today.  The ekg ordered today shows sinus rhythm 77 bpm, PR 198 msec, LAE, otherwise normal ekg   Recent Labs: 12/25/2015: BUN 8; Creatinine, Ser 1.02; Hemoglobin 15.6; Platelets 204; Potassium 3.2; Sodium 133    Lipid Panel  No results found for: CHOL, TRIG, HDL, CHOLHDL, VLDL, LDLCALC, LDLDIRECT   Wt Readings from Last 3 Encounters:  02/19/16 207 lb (93.9 kg)  01/31/16 203 lb 3.2 oz (92.2 kg)      Other studies Reviewed: Additional studies/ records that were reviewed today include: echo 01/04/2016 (Dr Aaron Mose office) which revealed moderate LVH, mild diastolic dysfunction, EF 15-04%,  normal RV size/ function, normal LA size, trace MR He wore a heart monitor for 30 days 8/17.  This revealed sinus rhythm with PVCs, no afib was seen    ASSESSMENT AND PLAN:  1.  Paroxysmal atrial fibrillation Doing well at this time.  No recent episodes documented. chads2vasc score is 1.  He is not interested in anticoagulation at this time.  2. PVCs He has frequent palpitations which by history are consistent with PVCs.  He has PVCs well documented on recent event monitor. Continue currently therapy  3. HTN Elevated today Add norvasc 65m daily This can be uptitrated by PCP Consider stopping hctz in the future which may be contributing to PVCs  4. OSA Compliance with CPAP encouraged.  He says that he cannot wear this.  Follow-up with PCP for blood pressure management in 4 weeks I will see in 2 months  Current medicines are reviewed at length with the patient today.   The patient does not have concerns regarding his medicines.  The following changes were made today:  none   Signed, JThompson Grayer MD   [phone](office) [phone](fax)

## 2016-04-02 ENCOUNTER — Ambulatory Visit
Admission: RE | Admit: 2016-04-02 | Discharge: 2016-04-02 | Disposition: A | Discharge status: Home / Self Care | Payer: 59 | Source: Ambulatory Visit | Attending: Nurse Practitioner | Admitting: Nurse Practitioner

## 2016-04-02 DIAGNOSIS — K7400 Hepatic fibrosis, unspecified: Secondary | ICD-10-CM

## 2016-04-17 ENCOUNTER — Encounter: Payer: Self-pay | Admitting: Internal Medicine

## 2016-04-24 ENCOUNTER — Other Ambulatory Visit: Payer: Self-pay | Admitting: Internal Medicine

## 2016-04-24 ENCOUNTER — Ambulatory Visit (INDEPENDENT_AMBULATORY_CARE_PROVIDER_SITE_OTHER): Payer: 59 | Admitting: Internal Medicine

## 2016-04-24 ENCOUNTER — Encounter: Payer: Self-pay | Admitting: Internal Medicine

## 2016-04-24 VITALS — BP 142/84 | HR 80 | Ht 75.0 in | Wt 207.0 lb

## 2016-04-24 DIAGNOSIS — R079 Chest pain, unspecified: Secondary | ICD-10-CM | POA: Diagnosis not present

## 2016-04-24 DIAGNOSIS — I480 Paroxysmal atrial fibrillation: Secondary | ICD-10-CM

## 2016-04-24 DIAGNOSIS — I10 Essential (primary) hypertension: Secondary | ICD-10-CM | POA: Diagnosis not present

## 2016-04-24 DIAGNOSIS — I493 Ventricular premature depolarization: Secondary | ICD-10-CM | POA: Diagnosis not present

## 2016-04-24 MED ORDER — LOSARTAN POTASSIUM 100 MG PO TABS: 100.0000 mg | ORAL_TABLET | Freq: Every day | ORAL | 3 refills | Status: DC

## 2016-04-24 MED ORDER — METOPROLOL SUCCINATE ER 100 MG PO TB24: 100.0000 mg | ORAL_TABLET | Freq: Every day | ORAL | 3 refills | Status: DC

## 2016-04-24 NOTE — Telephone Encounter (Signed)
Medication Detail    Disp Refills Start End   metoprolol succinate (TOPROL-XL) 100 MG 24 hr tablet 90 tablet 3 04/24/2016 07/23/2016   Sig - Route: Take 1 tablet (100 mg total) by mouth daily. Take with or immediately following a meal. - Oral   E-Prescribing Status: Receipt confirmed by pharmacy (04/24/2016 10:18 AM EST)   Pharmacy   [id number] - [place], [place] - [address] AT [address]

## 2016-04-24 NOTE — Progress Notes (Signed)
Electrophysiology Office Note   Date:  04/24/2016   ID:  Mathew [name], DOB 1950-07-19, MRN [id number]  PCP:  Maggie Font, MD  Cardiologist:  Dr Josie Dixon Primary Electrophysiologist: Thompson Grayer, MD    CC: afib   History of Present Illness: Mathew [name] is a 64 y.o. male who presents today for electrophysiology evaluation.   He is doing reasonably well.  He has chest heaviness at times.  He continues to have rare palpitations with breathlessness.  He did not stop his hctz as instructed last visit. Today, he denies symptoms of chest pain, orthopnea, PND, lower extremity edema, claudication, dizziness, presyncope, syncope, bleeding, or neurologic sequela.  He has chronic SOB which he attributes to tobacco.  The patient is tolerating medications without difficulties and is otherwise without complaint today.    Past Medical History:  Diagnosis Date  . Hepatitis C    s/p treatment  . Hypertension   . Obstructive sleep apnea    unable to tolerate CPAP  . Premature ventricular contraction   . Prostate cancer ([place]) 2006   Past Surgical History:  Procedure Laterality Date  . prostectomy    . SPLENECTOMY, TOTAL       Current Outpatient Prescriptions  Medication Sig Dispense Refill  . amLODipine (NORVASC) 5 MG tablet Take 1 tablet (5 mg total) by mouth daily. 90 tablet 3  . losartan-hydrochlorothiazide (HYZAAR) 100-25 MG tablet Take 1 tablet by mouth daily.    . metoprolol (LOPRESSOR) 50 MG tablet Take 75 mg by mouth every morning.    . valACYclovir (VALTREX) 500 MG tablet Take 1 tablet by mouth daily as needed. For flare ups     No current facility-administered medications for this visit.     Allergies:   Patient has no known allergies.   Social History:  The patient  reports that he has been smoking Cigarettes.  He has been smoking about 0.50 packs per day. He has never used smokeless tobacco. He reports that he drinks alcohol. He reports that he does not use drugs.    Family History:  The patient's  FH includes hypertension.  Sister died of cancer.  Brother died of IVDA and HIV.   He is unaware of any other FH of sudden death.   ROS:  Please see the history of present illness.   All other systems are reviewed and negative.    PHYSICAL EXAM: VS:  BP (!) 142/84   Pulse 80   Ht _0  (1.905 m)   Wt 207 lb (93.9 kg)   BMI 25.87 kg/m  , BMI Body mass index is 25.87 kg/m. GEN: Well nourished, well developed, in no acute distress  HEENT: normal  Neck: no JVD, carotid bruits, or masses Cardiac: RRR; no murmurs, rubs, or gallops,no edema  Respiratory:  clear to auscultation bilaterally, normal work of breathing GI: soft, nontender, nondistended, + BS MS: no deformity or atrophy  Skin: warm and dry  Neuro:  Strength and sensation are intact Psych: euthymic mood, full affect  EKG:  EKG is ordered today. The ekg ordered today shows sinus rhythm 80 bpm, PR 194 msec, LAE, otherwise normal ekg   Recent Labs: 12/25/2015: BUN 8; Creatinine, Ser 1.02; Hemoglobin 15.6; Platelets 204; Potassium 3.2; Sodium 133    Lipid Panel  No results found for: CHOL, TRIG, HDL, CHOLHDL, VLDL, LDLCALC, LDLDIRECT   Wt Readings from Last 3 Encounters:  04/24/16 207 lb (93.9 kg)  02/19/16 207 lb (93.9 kg)  01/31/16 203 lb  3.2 oz (92.2 kg)      Other studies Reviewed: Additional studies/ records that were reviewed today include: echo 01/04/2016 (Dr Aaron Mose office) which revealed moderate LVH, mild diastolic dysfunction, EF 75-61%, normal RV size/ function, normal LA size, trace MR He wore a heart monitor for 30 days 8/17.  This revealed sinus rhythm with PVCs, no afib was seen    ASSESSMENT AND PLAN:  1.  Paroxysmal atrial fibrillation Doing well at this time.  No recent episodes documented. chads2vasc score is 1.  He is not interested in anticoagulation at this time.  2. PVCs Continues to have symptomatic PVCs myoview to exclude ischemia Metoprolol  succinate 138m daily  3. HTN Elevated today norvasc added last visit Increase metoprolol today This can be uptitrated by PCP Stop hctz  which may be contributing to PVCs  4. OSA Compliance with CPAP encouraged.  He says that he cannot wear this.  Follow-up with PCP for blood pressure management  Return to see EP APP in 2 months  Current medicines are reviewed at length with the patient today.   The patient does not have concerns regarding his medicines.  The following changes were made today:  none   Signed, JThompson Grayer MD   [phone](office) [phone](fax)

## 2016-04-24 NOTE — Patient Instructions (Signed)
Medication Instructions:  Your physician has recommended you make the following change in your medication:  1) STOP Losartan-HCTZ 2) Start Losartan 100 mg daily 3) STOP current Metoprolol 4) Start Metoprolol Succinate 100 mg daily   Labwork: None ordered   Testing/Procedures: Your physician has requested that you have en exercise stress myoview. For further information please visit [email or web address]. Please follow instruction sheet, as given.    Follow-Up: Your physician recommends that you schedule a follow-up appointment in: 2 months with Chanetta Marshall, NP   Any Other Special Instructions Will Be Listed Below (If Applicable).     If you need a refill on your cardiac medications before your next appointment, please call your pharmacy.

## 2016-05-20 ENCOUNTER — Telehealth (HOSPITAL_COMMUNITY): Payer: Self-pay | Admitting: *Deleted

## 2016-05-20 NOTE — Telephone Encounter (Signed)
Patient given detailed instructions per Myocardial Perfusion Study Information Sheet for the test on 05/23/16 at 1000. Patient notified to arrive 15 minutes early and that it is imperative to arrive on time for appointment to keep from having the test rescheduled.  If you need to cancel or reschedule your appointment, please call the office within 24 hours of your appointment. Failure to do so may result in a cancellation of your appointment, and a $50 no show fee. Patient verbalized understanding.Uchechukwu Dhawan, Ranae Palms

## 2016-05-23 ENCOUNTER — Encounter (HOSPITAL_COMMUNITY): Payer: 59

## 2016-05-28 ENCOUNTER — Telehealth (HOSPITAL_COMMUNITY): Payer: Self-pay | Admitting: *Deleted

## 2016-05-28 NOTE — Telephone Encounter (Signed)
Left message on voicemail in reference to upcoming appointment scheduled for 05/30/16 on cell #. DPR states to only leave message on home #. Answer machine did not pick up. Phone number on cell # given for a call back so details instructions can be given. Dominic Mahaney, Ranae [place]ms

## 2016-05-29 ENCOUNTER — Telehealth (HOSPITAL_COMMUNITY): Payer: Self-pay | Admitting: *Deleted

## 2016-05-29 NOTE — Telephone Encounter (Signed)
Patient given detailed instructions per Myocardial Perfusion Study Information Sheet for the test on 05/30/16 at 10:00. Patient notified to arrive 15 minutes early and that it is imperative to arrive on time for appointment to keep from having the test rescheduled.  If you need to cancel or reschedule your appointment, please call the office within 24 hours of your appointment. Failure to do so may result in a cancellation of your appointment, and a $50 no show fee. Patient verbalized understanding.Veronia Beets

## 2016-05-30 ENCOUNTER — Ambulatory Visit (HOSPITAL_COMMUNITY): Payer: 59 | Attending: Internal Medicine

## 2016-05-30 DIAGNOSIS — R079 Chest pain, unspecified: Secondary | ICD-10-CM | POA: Insufficient documentation

## 2016-05-30 LAB — MYOCARDIAL PERFUSION IMAGING
Estimated workload: 7 METS
Exercise duration (min): 6 min
Exercise duration (sec): 0 s
LV dias vol: 97 mL (ref 62–150)
LV sys vol: 32 mL
MPHR: 156 {beats}/min
Peak HR: 139 {beats}/min
Percent HR: 89 %
RATE: 0.31
Rest HR: 70 {beats}/min
SDS: 3
SRS: 5
SSS: 8
TID: 1.01

## 2016-05-30 MED ORDER — TECHNETIUM TC 99M TETROFOSMIN IV KIT
10.3000 | PACK | Freq: Once | INTRAVENOUS | Status: AC | PRN
  Administered 2016-05-30: 10.3 via INTRAVENOUS
  Filled 2016-05-30: qty 11

## 2016-05-30 MED ORDER — TECHNETIUM TC 99M TETROFOSMIN IV KIT
32.5000 | PACK | Freq: Once | INTRAVENOUS | Status: AC | PRN
  Administered 2016-05-30: 32.5 via INTRAVENOUS
  Filled 2016-05-30: qty 33

## 2016-06-24 NOTE — Progress Notes (Signed)
Electrophysiology Office Note Date: 06/26/2016  ID:  Mathew [name], DOB 05-Nov-1950, MRN [id number]  PCP: Maggie Font, MD Primary Cardiologist: [place] Electrophysiologist: [facility]  CC: AF follow up  Mathew [name] is a 66 y.o. male seen today for Dr Rayann Heman.  He presents today for routine electrophysiology followup.  Since last being seen in our clinic, the patient reports doing relatively well.   He states that his PVC burden is improved on increased BB.  He continues to struggle with fatigue and occasional dizziness/pre-syncope. He has not had frank syncope.  He denies chest pain, PND, orthopnea, nausea, vomiting, syncope, edema, weight gain, or early satiety.  Past Medical History:  Diagnosis Date  . Hepatitis C    s/p treatment  . Hypertension   . Obstructive sleep apnea    unable to tolerate CPAP  . Premature ventricular contraction   . Prostate cancer ([place]) 2006   Past Surgical History:  Procedure Laterality Date  . prostectomy    . SPLENECTOMY, TOTAL      Current Outpatient Prescriptions  Medication Sig Dispense Refill  . losartan (COZAAR) 100 MG tablet Take 1 tablet (100 mg total) by mouth daily. 90 tablet 3  . metoprolol succinate (TOPROL-XL) 100 MG 24 hr tablet Take 1 tablet (100 mg total) by mouth daily. Take with or immediately following a meal. 90 tablet 3  . valACYclovir (VALTREX) 500 MG tablet Take 1 tablet by mouth daily as needed. For flare ups    . amLODipine (NORVASC) 5 MG tablet Take 1 tablet (5 mg total) by mouth daily. 90 tablet 3   No current facility-administered medications for this visit.     Allergies:   Patient has no known allergies.   Social History: Social History   Social History  . Marital status: Married    Spouse name: N/A  . Number of children: N/A  . Years of education: N/A   Occupational History  . Not on file.   Social History Main Topics  . Smoking status: Current Every Day Smoker    Packs/day: 0.50    Types:  Cigarettes  . Smokeless tobacco: Never Used  . Alcohol use Yes     Comment: weekends  . Drug use: No  . Sexual activity: Not on file   Other Topics Concern  . Not on file   Social History Narrative   Pt lives in [place] with spouse.  3 children.  Works as a Administrator.    Family History: Family History  Problem Relation Age of Onset  . Heart attack Neg Hx     Review of Systems: All other systems reviewed and are otherwise negative except as noted above.   Physical Exam: VS:  BP (!) 138/94   Pulse 83   Ht _0  (1.905 m)   Wt 211 lb (95.7 kg)   BMI 26.37 kg/m  , BMI Body mass index is 26.37 kg/m. Wt Readings from Last 3 Encounters:  06/26/16 211 lb (95.7 kg)  04/24/16 207 lb (93.9 kg)  02/19/16 207 lb (93.9 kg)    GEN- The patient is well appearing, alert and oriented x 3 today.   HEENT: normocephalic, atraumatic; sclera clear, conjunctiva pink; hearing intact; oropharynx clear; neck supple  Lungs- Clear to ausculation bilaterally, normal work of breathing.  No wheezes, rales, rhonchi Heart- Regular rate and rhythm  GI- soft, non-tender, non-distended, bowel sounds present  Extremities- no clubbing, cyanosis, or edema  MS- no significant deformity or atrophy Skin-  warm and dry, no rash or lesion  Psych- euthymic mood, full affect Neuro- strength and sensation are intact   EKG:  EKG is ordered today. EKG today demonstrates sinus rhythm  Recent Labs: 12/25/2015: BUN 8; Creatinine, Ser 1.02; Hemoglobin 15.6; Platelets 204; Potassium 3.2; Sodium 133    Other studies Reviewed: Additional studies/ records that were reviewed today include: Dr Jackalyn Lombard office notes  Assessment and Plan: 1.  Paroxysmal atrial fibrillation Burden by symptoms difficult to determine (see below) CHADS2VASC is 1 until October when he turns 66.   2.  PVC's Recent myoview normal Improved on BB  3.  HTN Stable No change required today  4.  OSA He is intolerant of CPAP We  will refer to Dr Erik Obey to see if there are other options for OSA treatment.   He continues to struggle with fatigue/dizziness/palpitations and pre-syncope.  I think that he would be best served with adequate treatment of OSA.  However, there is also the possibility that he is having more AF than he is aware of that could be causing symptoms. He has previously worn an event monitor which was not well tolerated.  We have discussed monitoring options today. I think that long term, ILR is most reasonable.  He is reluctant to wear another external monitor. Risks, benefits to ILR reviewed with patient today. Will plan with Dr Rayann Heman at next available time.     Current medicines are reviewed at length with the patient today.   The patient does not have concerns regarding his medicines.  The following changes were made today:  none  Labs/ tests ordered today include: none Orders Placed This Encounter  Procedures  . EKG 12-Lead     Disposition:   Follow up with me in 6 weeks, refer to Dr Erik Obey   Signed, Chanetta Marshall, NP 06/26/2016 2:52 PM   [facility] [address] [place] [address] [phone] (office) [phone] (fax)

## 2016-06-26 ENCOUNTER — Encounter: Payer: Self-pay | Admitting: *Deleted

## 2016-06-26 ENCOUNTER — Ambulatory Visit (INDEPENDENT_AMBULATORY_CARE_PROVIDER_SITE_OTHER): Payer: 59 | Admitting: Nurse Practitioner

## 2016-06-26 VITALS — BP 138/94 | HR 83 | Ht 75.0 in | Wt 211.0 lb

## 2016-06-26 DIAGNOSIS — G4733 Obstructive sleep apnea (adult) (pediatric): Secondary | ICD-10-CM

## 2016-06-26 DIAGNOSIS — I493 Ventricular premature depolarization: Secondary | ICD-10-CM | POA: Diagnosis not present

## 2016-06-26 DIAGNOSIS — I480 Paroxysmal atrial fibrillation: Secondary | ICD-10-CM

## 2016-06-26 DIAGNOSIS — R0602 Shortness of breath: Secondary | ICD-10-CM

## 2016-06-26 DIAGNOSIS — I10 Essential (primary) hypertension: Secondary | ICD-10-CM

## 2016-06-26 NOTE — Patient Instructions (Addendum)
Medication Instructions:   Your physician recommends that you continue on your current medications as directed. Please refer to the Current Medication list given to you today.   If you need a refill on your cardiac medications before your next appointment, please call your pharmacy.  Labwork:  CBC AND BMET TODAY     Testing/Procedures:  SEE LETTER FOR INSTRUCTIONS FOR LOOP RECORDER     Follow-Up: IN 6 WEEKS WITH AMBER SEILER     Any Other Special Instructions Will Be Listed Below (If Applicable).

## 2016-06-27 ENCOUNTER — Encounter: Payer: Self-pay | Admitting: Internal Medicine

## 2016-07-02 ENCOUNTER — Telehealth: Payer: Self-pay | Admitting: *Deleted

## 2016-07-02 NOTE — Telephone Encounter (Signed)
Pt was notified that  he does not need to be fasting for procedure on 07-04-2016. He can eat/drink/take meds just like normal that day and also about his  ENT referral and contact number.

## 2016-07-04 ENCOUNTER — Encounter (HOSPITAL_COMMUNITY)
Admission: RE | Disposition: A | Discharge status: Home / Self Care | Payer: Self-pay | Source: Ambulatory Visit | Attending: Internal Medicine

## 2016-07-04 ENCOUNTER — Other Ambulatory Visit: Payer: Self-pay | Admitting: Nurse Practitioner

## 2016-07-04 ENCOUNTER — Ambulatory Visit (HOSPITAL_COMMUNITY)
Admission: RE | Admit: 2016-07-04 | Discharge: 2016-07-04 | Disposition: A | Discharge status: Home / Self Care | Payer: 59 | Source: Ambulatory Visit | Attending: Internal Medicine | Admitting: Internal Medicine

## 2016-07-04 DIAGNOSIS — F17210 Nicotine dependence, cigarettes, uncomplicated: Secondary | ICD-10-CM | POA: Insufficient documentation

## 2016-07-04 DIAGNOSIS — I10 Essential (primary) hypertension: Secondary | ICD-10-CM | POA: Diagnosis not present

## 2016-07-04 DIAGNOSIS — I480 Paroxysmal atrial fibrillation: Secondary | ICD-10-CM | POA: Diagnosis not present

## 2016-07-04 DIAGNOSIS — B1920 Unspecified viral hepatitis C without hepatic coma: Secondary | ICD-10-CM | POA: Diagnosis not present

## 2016-07-04 DIAGNOSIS — K7469 Other cirrhosis of liver: Secondary | ICD-10-CM

## 2016-07-04 DIAGNOSIS — G4733 Obstructive sleep apnea (adult) (pediatric): Secondary | ICD-10-CM | POA: Insufficient documentation

## 2016-07-04 DIAGNOSIS — I493 Ventricular premature depolarization: Secondary | ICD-10-CM | POA: Insufficient documentation

## 2016-07-04 DIAGNOSIS — I4891 Unspecified atrial fibrillation: Secondary | ICD-10-CM | POA: Diagnosis not present

## 2016-07-04 HISTORY — PX: LOOP RECORDER INSERTION: EP1214

## 2016-07-04 SURGERY — LOOP RECORDER INSERTION
Anesthesia: LOCAL

## 2016-07-04 MED ORDER — LIDOCAINE HCL (PF) 1 % IJ SOLN
INTRAMUSCULAR | Status: AC
  Filled 2016-07-04: qty 30

## 2016-07-04 SURGICAL SUPPLY — 2 items
LOOP REVEAL LINQSYS (Prosthesis & Implant Heart) ×1 IMPLANT
PACK LOOP INSERTION (CUSTOM PROCEDURE TRAY) ×2 IMPLANT

## 2016-07-04 NOTE — Discharge Instructions (Signed)
loop Care After This sheet gives you information about how to care for yourself after your procedure. Your health care provider may also give you more specific instructions. If you have problems or questions, contact your health care provider. What can I expect after the procedure? After your procedure, it is common to have:  Pain or soreness at the site where the pacemaker was inserted.  Swelling at the site where the loop was inserted. Follow these instructions at home: Incision care   Keep the incision clean and dry.  Remove dressing in 24 hours, shower in 24 hours,   Do not take baths, swim, or use a hot tub until your health care provider approves.  You may shower the day after your procedure, or as directed by your health care provider.  Pat the area dry with a clean towel. Do not rub the area. This may cause bleeding.  Follow instructions from your health care provider about how to take care of your incision. Make sure you:  Wash your hands with soap and water before you change your bandage (dressing). If soap and water are not available, use hand sanitizer.  Change your dressing as told by your health care provider.  Leave stitches (sutures), skin glue, or adhesive strips in place. These skin closures may need to stay in place for 2 weeks or longer. If adhesive strip edges start to loosen and curl up, you may trim the loose edges. Do not remove adhesive strips completely unless your health care provider tells you to do that.  Check your incision area every day for signs of infection. Check for:  More redness, swelling, or pain.  More fluid or blood.  Warmth.  Pus or a bad smell. Activity   Do not lift anything that is heavier than 10 lb (4.5 kg) until your health care provider says it is okay to do so.  For the first 2 weeks, or as long as told by your health care provider:  Avoid lifting your left arm higher than your shoulder.  Be gentle when you move your arms  over your head. It is okay to raise your arm to comb your hair.  Avoid strenuous exercise.  Ask your health care provider when it is okay to:  Resume your normal activities.  Return to work or school.  Resume sexual activity. Eating and drinking   Eat a heart-healthy diet. This should include plenty of fresh fruits and vegetables, whole grains, low-fat dairy products, and lean protein like chicken and fish.  Limit alcohol intake to no more than 1 drink a day for non-pregnant women and 2 drinks a day for men. One drink equals 12 oz of beer, 5 oz of wine, or 1 oz of hard liquor.  Check ingredients and nutrition facts on packaged foods and beverages. Avoid the following types of food:  Food that is high in salt (sodium).  Food that is high in saturated fat, like full-fat dairy or red meat.  Food that is high in trans fat, like fried food.  Food and drinks that are high in sugar. Lifestyle   Do not use any products that contain nicotine or tobacco, such as cigarettes and e-cigarettes. If you need help quitting, ask your health care provider.  Take steps to manage and control your weight.  Get regular exercise. Aim for 150 minutes of moderate-intensity exercise (such as walking or yoga) or 75 minutes of vigorous exercise (such as running or swimming) each week.  Manage other  health problems, such as diabetes or high blood pressure. Ask your health care provider how you can manage these conditions. General instructions   Do not drive for 24 hours after your procedure if you were given a medicine to help you relax (sedative).  Take over-the-counter and prescription medicines only as told by your health care provider.  Avoid putting pressure on the area where the pacemaker was placed.  If you need an MRI after your pacemaker has been placed, be sure to tell the health care provider who orders the MRI that you have a pacemaker.  Avoid close and prolonged exposure to electrical  devices that have strong magnetic fields. These include:  Cell phones. Avoid keeping them in a pocket near the pacemaker, and try using the ear opposite the pacemaker.  MP3 players.  Household appliances, like microwaves.  Metal detectors.  Electric generators.  High-tension wires.  Keep all follow-up visits as directed by your health care provider. This is important. Contact a health care provider if:  You have pain at the incision site that is not relieved by over-the-counter or prescription medicines.  You have any of these around your incision site or coming from it:  More redness, swelling, or pain.  Fluid or blood.  Warmth to the touch.  Pus or a bad smell.  You have a fever.  You feel brief, occasional palpitations, light-headedness, or any symptoms that you think might be related to your heart. Get help right away if:  You experience chest pain that is different from the pain at the pacemaker site.  You develop a red streak that extends above or below the incision site.  You experience shortness of breath.  You have palpitations or an irregular heartbeat.  You have light-headedness that does not go away quickly.  You faint or have dizzy spells.  Your pulse suddenly drops or increases rapidly and does not return to normal.  You begin to gain weight and your legs and ankles swell. Summary  After your procedure, it is common to have pain, soreness, and some swelling where the pacemaker was inserted.  Make sure to keep your incision clean and dry. Follow instructions from your health care provider about how to take care of your incision.  Check your incision every day for signs of infection, such as more pain or swelling, pus or a bad smell, warmth, or leaking fluid and blood.  Avoid strenuous exercise and lifting your left arm higher than your shoulder for 2 weeks, or as long as told by your health care provider. This information is not intended to  replace advice given to you by your health care provider. Make sure you discuss any questions you have with your health care provider. Document Released: 02/10/2013 Document Revised: 03/14/2016 Document Reviewed: 03/14/2016 Elsevier Interactive Patient Education  2017 [employer].

## 2016-07-04 NOTE — H&P (View-Only) (Signed)
Electrophysiology Office Note Date: 06/26/2016  ID:  Mathew [name], DOB Dec 31, 1950, MRN [id number]  PCP: Maggie Font, MD Primary Cardiologist: [name] Electrophysiologist: [facility]  CC: AF follow up  Mathew [name] is a 66 y.o. male seen today for Dr Rayann Heman.  He presents today for routine electrophysiology followup.  Since last being seen in our clinic, the patient reports doing relatively well.   He states that his PVC burden is improved on increased BB.  He continues to struggle with fatigue and occasional dizziness/pre-syncope. He has not had frank syncope.  He denies chest pain, PND, orthopnea, nausea, vomiting, syncope, edema, weight gain, or early satiety.  Past Medical History:  Diagnosis Date  . Hepatitis C    s/p treatment  . Hypertension   . Obstructive sleep apnea    unable to tolerate CPAP  . Premature ventricular contraction   . Prostate cancer ([facility]) 2006   Past Surgical History:  Procedure Laterality Date  . prostectomy    . SPLENECTOMY, TOTAL      Current Outpatient Prescriptions  Medication Sig Dispense Refill  . losartan (COZAAR) 100 MG tablet Take 1 tablet (100 mg total) by mouth daily. 90 tablet 3  . metoprolol succinate (TOPROL-XL) 100 MG 24 hr tablet Take 1 tablet (100 mg total) by mouth daily. Take with or immediately following a meal. 90 tablet 3  . valACYclovir (VALTREX) 500 MG tablet Take 1 tablet by mouth daily as needed. For flare ups    . amLODipine (NORVASC) 5 MG tablet Take 1 tablet (5 mg total) by mouth daily. 90 tablet 3   No current facility-administered medications for this visit.     Allergies:   Patient has no known allergies.   Social History: Social History   Social History  . Marital status: Married    Spouse name: Mathew [name]  . Number of children: [name]  . Years of education: [name]   Occupational History  . Not on file.   Social History Main Topics  . Smoking status: Current Every Day Smoker    Packs/day: 0.50    Types:  Cigarettes  . Smokeless tobacco: Never Used  . Alcohol use Yes     Comment: weekends  . Drug use: No  . Sexual activity: Not on file   Other Topics Concern  . Not on file   Social History Narrative   Pt lives in [name] with spouse.  3 children.  Works as a Administrator.    Family History: Family History  Problem Relation Age of Onset  . Heart attack Neg Hx     Review of Systems: All other systems reviewed and are otherwise negative except as noted above.   Physical Exam: VS:  BP (!) 138/94   Pulse 83   Ht _0  (1.905 m)   Wt 211 lb (95.7 kg)   BMI 26.37 kg/m  , BMI Body mass index is 26.37 kg/m. Wt Readings from Last 3 Encounters:  06/26/16 211 lb (95.7 kg)  04/24/16 207 lb (93.9 kg)  02/19/16 207 lb (93.9 kg)    GEN- The patient is well appearing, alert and oriented x 3 today.   HEENT: normocephalic, atraumatic; sclera clear, conjunctiva pink; hearing intact; oropharynx clear; neck supple  Lungs- Clear to ausculation bilaterally, normal work of breathing.  No wheezes, rales, rhonchi Heart- Regular rate and rhythm  GI- soft, non-tender, non-distended, bowel sounds present  Extremities- no clubbing, cyanosis, or edema  MS- no significant deformity or atrophy Skin-  warm and dry, no rash or lesion  Psych- euthymic mood, full affect Neuro- strength and sensation are intact   EKG:  EKG is ordered today. EKG today demonstrates sinus rhythm  Recent Labs: 12/25/2015: BUN 8; Creatinine, Ser 1.02; Hemoglobin 15.6; Platelets 204; Potassium 3.2; Sodium 133    Other studies Reviewed: Additional studies/ records that were reviewed today include: Dr Jackalyn Lombard office notes  Assessment and Plan: 1.  Paroxysmal atrial fibrillation Burden by symptoms difficult to determine (see below) CHADS2VASC is 1 until October when he turns 66.   2.  PVC's Recent myoview normal Improved on BB  3.  HTN Stable No change required today  4.  OSA He is intolerant of CPAP We  will refer to Dr Erik Obey to see if there are other options for OSA treatment.   He continues to struggle with fatigue/dizziness/palpitations and pre-syncope.  I think that he would be best served with adequate treatment of OSA.  However, there is also the possibility that he is having more AF than he is aware of that could be causing symptoms. He has previously worn an event monitor which was not well tolerated.  We have discussed monitoring options today. I think that long term, ILR is most reasonable.  He is reluctant to wear another external monitor. Risks, benefits to ILR reviewed with patient today. Will plan with Dr Rayann Heman at next available time.     Current medicines are reviewed at length with the patient today.   The patient does not have concerns regarding his medicines.  The following changes were made today:  none  Labs/ tests ordered today include: none Orders Placed This Encounter  Procedures  . EKG 12-Lead     Disposition:   Follow up with me in 6 weeks, refer to Dr Erik Obey   Signed, Chanetta Marshall, NP 06/26/2016 2:52 PM   [facility] [address] [name] [address] [phone] (office) [phone] (fax)

## 2016-07-04 NOTE — Interval H&P Note (Signed)
History and Physical Interval Note:  07/04/2016 4:09 PM  Mathew [name]  has presented today for surgery, with the diagnosis of palpitations  The various methods of treatment have been discussed with the patient and family. After consideration of risks, benefits and other options for treatment, the patient has consented to  Procedure(s): Loop Recorder Insertion (N/A) as a surgical intervention .  The patient's history has been reviewed, patient examined, no change in status, stable for surgery.  I have reviewed the patient's chart and labs.  Questions were answered to the patient's satisfaction.    Pt with palpitations of unclear etiology.  He has a h/o afib however it is difficult to know how much afib he has and if this corresponds to his breathlessness.  I have therefore recommended ILR placement for evaluation of palpitations and for AF management.  Risks of procedure discussed with patient who wishes to proceed.   Thompson Grayer

## 2016-07-05 ENCOUNTER — Encounter (HOSPITAL_COMMUNITY): Payer: Self-pay | Admitting: Internal Medicine

## 2016-07-05 MED FILL — Lidocaine HCl Local Preservative Free (PF) Inj 1%: INTRAMUSCULAR | Qty: 30 | Status: AC

## 2016-07-11 ENCOUNTER — Telehealth: Payer: Self-pay | Admitting: Internal Medicine

## 2016-07-11 NOTE — Telephone Encounter (Signed)
New Message  Pt voiced wanting to speak with nurse.  Pt confirmed appt on 3.13.18.  Please f/u

## 2016-07-11 NOTE — Telephone Encounter (Signed)
Patient needs an afternoon appointment as he does not want to miss any more work.  Can you all call him and pick a day to have wound check.  Most afternoons after 3pm

## 2016-07-16 ENCOUNTER — Ambulatory Visit: Payer: 59

## 2016-07-24 ENCOUNTER — Emergency Department (HOSPITAL_COMMUNITY)
Admission: EM | Admit: 2016-07-24 | Discharge: 2016-07-25 | Disposition: A | Discharge status: Home / Self Care | Payer: 59 | Attending: Emergency Medicine | Admitting: Emergency Medicine

## 2016-07-24 ENCOUNTER — Encounter (HOSPITAL_COMMUNITY): Payer: Self-pay | Admitting: Emergency Medicine

## 2016-07-24 ENCOUNTER — Emergency Department (HOSPITAL_COMMUNITY): Payer: 59

## 2016-07-24 DIAGNOSIS — R6889 Other general symptoms and signs: Secondary | ICD-10-CM

## 2016-07-24 DIAGNOSIS — Z8546 Personal history of malignant neoplasm of prostate: Secondary | ICD-10-CM | POA: Insufficient documentation

## 2016-07-24 DIAGNOSIS — R0789 Other chest pain: Secondary | ICD-10-CM | POA: Insufficient documentation

## 2016-07-24 DIAGNOSIS — M549 Dorsalgia, unspecified: Secondary | ICD-10-CM | POA: Insufficient documentation

## 2016-07-24 DIAGNOSIS — R0981 Nasal congestion: Secondary | ICD-10-CM | POA: Insufficient documentation

## 2016-07-24 DIAGNOSIS — F17210 Nicotine dependence, cigarettes, uncomplicated: Secondary | ICD-10-CM | POA: Diagnosis not present

## 2016-07-24 DIAGNOSIS — I10 Essential (primary) hypertension: Secondary | ICD-10-CM | POA: Insufficient documentation

## 2016-07-24 DIAGNOSIS — J189 Pneumonia, unspecified organism: Secondary | ICD-10-CM | POA: Diagnosis not present

## 2016-07-24 DIAGNOSIS — Z79899 Other long term (current) drug therapy: Secondary | ICD-10-CM | POA: Diagnosis not present

## 2016-07-24 DIAGNOSIS — R05 Cough: Secondary | ICD-10-CM | POA: Diagnosis present

## 2016-07-24 LAB — CBC
HCT: 45.5 % (ref 39.0–52.0)
Hemoglobin: 14.9 g/dL (ref 13.0–17.0)
MCH: 29.1 pg (ref 26.0–34.0)
MCHC: 32.7 g/dL (ref 30.0–36.0)
MCV: 88.9 fL (ref 78.0–100.0)
Platelets: 174 10*3/uL (ref 150–400)
RBC: 5.12 MIL/uL (ref 4.22–5.81)
RDW: 15.1 % (ref 11.5–15.5)
WBC: 9.6 10*3/uL (ref 4.0–10.5)

## 2016-07-24 LAB — COMPREHENSIVE METABOLIC PANEL
ALT: 31 U/L (ref 17–63)
AST: 32 U/L (ref 15–41)
Albumin: 4.3 g/dL (ref 3.5–5.0)
Alkaline Phosphatase: 62 U/L (ref 38–126)
Anion gap: 10 (ref 5–15)
BUN: 13 mg/dL (ref 6–20)
CO2: 21 mmol/L — ABNORMAL LOW (ref 22–32)
Calcium: 9.7 mg/dL (ref 8.9–10.3)
Chloride: 104 mmol/L (ref 101–111)
Creatinine, Ser: 1 mg/dL (ref 0.61–1.24)
GFR calc Af Amer: >60 mL/min (ref >60)
GFR calc non Af Amer: >60 mL/min (ref >60)
Glucose, Bld: 99 mg/dL (ref 65–99)
Potassium: 3.9 mmol/L (ref 3.5–5.1)
Sodium: 135 mmol/L (ref 135–145)
Total Bilirubin: 0.9 mg/dL (ref 0.3–1.2)
Total Protein: 8.8 g/dL — ABNORMAL HIGH (ref 6.5–8.1)

## 2016-07-24 LAB — LIPASE, BLOOD: Lipase: 14 U/L (ref 11–51)

## 2016-07-24 MED ORDER — AEROCHAMBER PLUS FLO-VU LARGE MISC
1.0000 | Freq: Once | Status: AC
  Administered 2016-07-24: 1
  Filled 2016-07-24 (×2): qty 1

## 2016-07-24 MED ORDER — BENZONATATE 100 MG PO CAPS
200.0000 mg | ORAL_CAPSULE | Freq: Once | ORAL | Status: AC
  Administered 2016-07-24: 200 mg via ORAL
  Filled 2016-07-24: qty 2

## 2016-07-24 MED ORDER — ALBUTEROL SULFATE HFA 108 (90 BASE) MCG/ACT IN AERS
2.0000 | INHALATION_SPRAY | Freq: Once | RESPIRATORY_TRACT | Status: AC
  Administered 2016-07-24: 2 via RESPIRATORY_TRACT
  Filled 2016-07-24: qty 6.7

## 2016-07-24 MED ORDER — SODIUM CHLORIDE 0.9 % IV BOLUS (SEPSIS)
1000.0000 mL | Freq: Once | INTRAVENOUS | Status: AC
  Administered 2016-07-24: 1000 mL via INTRAVENOUS

## 2016-07-24 MED ORDER — KETOROLAC TROMETHAMINE 30 MG/ML IJ SOLN
30.0000 mg | Freq: Once | INTRAMUSCULAR | Status: AC
  Administered 2016-07-24: 30 mg via INTRAVENOUS
  Filled 2016-07-24: qty 1

## 2016-07-24 MED ORDER — ACETAMINOPHEN 325 MG PO TABS
650.0000 mg | ORAL_TABLET | Freq: Once | ORAL | Status: AC
  Administered 2016-07-24: 650 mg via ORAL
  Filled 2016-07-24: qty 2

## 2016-07-24 NOTE — ED Provider Notes (Signed)
[facility] DEPT Provider Note   CSN: [id number] Arrival date & time: 07/24/16  2008    History   Chief Complaint Chief Complaint  Patient presents with  . Flu Like Symptoms  . Diarrhea    HPI Mathew [name] is a 66 y.o. male.   66 year old male with a history of hypertension, prostate cancer, and hepatitis C presents to the emergency department for evaluation of flulike symptoms. He reports onset of nasal congestion yesterday. He has developed a harsh cough which is mildly congested. He states that he has had a diffuse discomfort in his chest with coughing and deep breathing. He has also been complaining of diffuse body aches, worse in his back. He has had 10-12 episodes of nonbloody diarrhea today. No nausea or vomiting. Patient does report chills, but denies known fever. He has taken Mucinex and vitamin C without relief of symptoms. No known sick contacts.     Past Medical History:  Diagnosis Date  . Hepatitis C    s/p treatment  . Hypertension   . Obstructive sleep apnea    unable to tolerate CPAP  . Premature ventricular contraction   . Prostate cancer ([place]) 2006    There are no active problems to display for this patient.   Past Surgical History:  Procedure Laterality Date  . LOOP RECORDER INSERTION N/A 07/04/2016   Procedure: Loop Recorder Insertion;  Surgeon: Thompson Grayer, MD;  Location: [facility];  Service: Cardiovascular;  Laterality: N/A;  . prostectomy    . SPLENECTOMY, TOTAL         Home Medications    Prior to Admission medications   Medication Sig Start Date End Date Taking? Authorizing Provider  amLODipine (NORVASC) 5 MG tablet Take 5 mg by mouth daily.  02/19/16  Yes Historical Provider, MD  Ascorbic Acid (VITAMIN C PO) Take 1 tablet by mouth daily.   Yes Historical Provider, MD  fluticasone (FLONASE) 50 MCG/ACT nasal spray 2 sprays by Each Nare route daily. 07/11/16  Yes Historical Provider, MD  guaiFENesin (MUCINEX) 600 MG 12 hr tablet  Take 600 mg by mouth 2 (two) times daily as needed for cough.   Yes Historical Provider, MD  losartan (COZAAR) 100 MG tablet Take 100 mg by mouth daily.  04/24/16  Yes Historical Provider, MD  metoprolol succinate (TOPROL-XL) 100 MG 24 hr tablet Take 100 mg by mouth daily.  04/24/16  Yes Historical Provider, MD  amLODipine (NORVASC) 5 MG tablet Take 1 tablet (5 mg total) by mouth daily. 02/19/16 07/01/16  Thompson Grayer, MD  benzonatate (TESSALON) 100 MG capsule Take 1-2 capsules (100-200 mg total) by mouth 3 (three) times daily as needed for cough. 07/25/16   Antonietta Breach, PA-C  levofloxacin (LEVAQUIN) 750 MG tablet Take 1 tablet (750 mg total) by mouth daily. 07/25/16   Antonietta Breach, PA-C  losartan (COZAAR) 100 MG tablet Take 1 tablet (100 mg total) by mouth daily. 04/24/16 07/23/16  Thompson Grayer, MD  metoprolol succinate (TOPROL-XL) 100 MG 24 hr tablet Take 1 tablet (100 mg total) by mouth daily. Take with or immediately following a meal. 04/24/16 07/23/16  Thompson Grayer, MD  predniSONE (DELTASONE) 20 MG tablet Take 2 tablets (40 mg total) by mouth daily. 07/25/16   Antonietta Breach, PA-C  valACYclovir (VALTREX) 500 MG tablet Take 1 tablet by mouth daily as needed. For cold sore flare ups 03/09/16   Historical Provider, MD    Family History Family History  Problem Relation Age of Onset  .  Heart attack Neg Hx     Social History Social History  Substance Use Topics  . Smoking status: Current Every Day Smoker    Packs/day: 0.50    Types: Cigarettes  . Smokeless tobacco: Never Used  . Alcohol use Yes     Comment: weekends     Allergies   Patient has no known allergies.   Review of Systems Review of Systems Ten systems reviewed and are negative for acute change, except as noted in the HPI.    Physical Exam Updated Vital Signs BP 125/80 (BP Location: Right Arm)   Pulse 74   Temp 99.1 F (37.3 C) (Oral)   Resp 16   Ht _0  (1.905 m)   Wt 96.2 kg   SpO2 96%   BMI 26.50 kg/m   Physical  Exam  Constitutional: He is oriented to person, place, and time. He appears well-developed and well-nourished. No distress.  Nontoxic appearing and in no acute distress.  HENT:  Head: Normocephalic and atraumatic.  Audible nasal congestion. Patient tolerating secretions without difficulty. No tripoding or stridor.  Eyes: Conjunctivae and EOM are normal. No scleral icterus.  Neck: Normal range of motion.  No meningismus  Cardiovascular: Normal rate, regular rhythm and intact distal pulses.   Pulmonary/Chest: Effort normal. No respiratory distress. He has no wheezes. He has no rales.  Respirations even and unlabored  Abdominal: Soft. He exhibits no distension. There is no tenderness. There is no guarding.  Soft abdomen. No focal tenderness. No masses or peritoneal signs.  Musculoskeletal: Normal range of motion.  Neurological: He is alert and oriented to person, place, and time. He exhibits normal muscle tone. Coordination normal.  GCS 15. Patient moving all extremities.  Skin: Skin is warm and dry. No rash noted. He is not diaphoretic. No erythema. No pallor.  Psychiatric: He has a normal mood and affect. His behavior is normal.  Nursing note and vitals reviewed.    ED Treatments / Results  Labs (all labs ordered are listed, but only abnormal results are displayed) Labs Reviewed  COMPREHENSIVE METABOLIC PANEL - Abnormal; Notable for the following:       Result Value   CO2 21 (*)    Total Protein 8.8 (*)    All other components within normal limits  URINALYSIS, ROUTINE W REFLEX MICROSCOPIC - Abnormal; Notable for the following:    Hgb urine dipstick SMALL (*)    Ketones, ur 20 (*)    Squamous Epithelial / LPF 0-5 (*)    All other components within normal limits  LIPASE, BLOOD  CBC    EKG  EKG Interpretation None       Radiology Dg Chest 2 View  Result Date: 07/24/2016 CLINICAL DATA:  Cough and chills EXAM: CHEST  2 VIEW COMPARISON:  12/25/2015 FINDINGS: Streaky  bibasilar infiltrates. No effusion. Mild cardiomegaly. No pneumothorax. IMPRESSION: Mild bibasilar infiltrates. Radiographic follow-up to resolution recommended. Electronically Signed   By: Donavan Foil M.D.   On: 07/24/2016 22:59    Procedures Procedures (including critical care time)  Medications Ordered in ED Medications  sodium chloride 0.9 % bolus 1,000 mL (0 mLs Intravenous Stopped 07/24/16 2307)  ketorolac (TORADOL) 30 MG/ML injection 30 mg (30 mg Intravenous Given 07/24/16 2202)  benzonatate (TESSALON) capsule 200 mg (200 mg Oral Given 07/24/16 2203)  albuterol (PROVENTIL HFA;VENTOLIN HFA) 108 (90 Base) MCG/ACT inhaler 2 puff (2 puffs Inhalation Given 07/24/16 2205)  AEROCHAMBER PLUS FLO-VU LARGE MISC 1 each (1 each Other Given 07/24/16  2204)  acetaminophen (TYLENOL) tablet 650 mg (650 mg Oral Given 07/24/16 2203)     Initial Impression / Assessment and Plan / ED Course  I have reviewed the triage vital signs and the nursing notes.  Pertinent labs & imaging results that were available during my care of the patient were reviewed by me and considered in my medical decision making (see chart for details).     66 year old male presents to the emergency department for evaluation of flulike illness. Symptoms associated with cough as well as body aches. Patient with mild fever on arrival to 100.11F. This responded well to Tylenol. Patient has had no tachycardia or hypotension. He has had no hypoxia. Chest x-ray suggests mild bibasilar infiltrates. Will start on antibiotics for pneumonia coverage. Patient also given prescriptions for symptomatic management. I have advised close primary care follow-up. Return precautions discussed and provided. Patient discharged in stable condition with no unaddressed concerns.   Final Clinical Impressions(s) / ED Diagnoses   Final diagnoses:  Flu-like symptoms  Community acquired pneumonia, unspecified laterality    New Prescriptions Discharge  Medication List as of 07/25/2016 12:44 AM    START taking these medications   Details  benzonatate (TESSALON) 100 MG capsule Take 1-2 capsules (100-200 mg total) by mouth 3 (three) times daily as needed for cough., Starting Thu 07/25/2016, Print    levofloxacin (LEVAQUIN) 750 MG tablet Take 1 tablet (750 mg total) by mouth daily., Starting Thu 07/25/2016, Print    predniSONE (DELTASONE) 20 MG tablet Take 2 tablets (40 mg total) by mouth daily., Starting Thu 07/25/2016, Print         [place], PA-C 07/25/16 0128    Fatima Blank, MD 07/25/16 [id number]

## 2016-07-24 NOTE — ED Triage Notes (Signed)
Pt comes today with complaints of flu like symptoms.  Endorses congestion and body aches that began yesterday.  Denies N&V.  States he has had 10-12 episodes of diarrhea today as well. Took mucinex and vitamin c without relief.

## 2016-07-25 ENCOUNTER — Ambulatory Visit (INDEPENDENT_AMBULATORY_CARE_PROVIDER_SITE_OTHER): Payer: 59 | Admitting: *Deleted

## 2016-07-25 DIAGNOSIS — R002 Palpitations: Secondary | ICD-10-CM

## 2016-07-25 LAB — URINALYSIS, ROUTINE W REFLEX MICROSCOPIC
Bacteria, UA: NONE SEEN
Bilirubin Urine: NEGATIVE
Glucose, UA: NEGATIVE mg/dL
Ketones, ur: 20 mg/dL — AB
Leukocytes, UA: NEGATIVE
Nitrite: NEGATIVE
Protein, ur: NEGATIVE mg/dL
Specific Gravity, Urine: 1.025 (ref 1.005–1.030)
pH: 5 (ref 5.0–8.0)

## 2016-07-25 LAB — CUP PACEART INCLINIC DEVICE CHECK
Date Time Interrogation Session: 20180322154440
Implantable Pulse Generator Implant Date: 20180301

## 2016-07-25 MED ORDER — PREDNISONE 20 MG PO TABS: 40.0000 mg | ORAL_TABLET | Freq: Every day | ORAL | 0 refills | Status: DC

## 2016-07-25 MED ORDER — BENZONATATE 100 MG PO CAPS: 100.0000 mg | ORAL_CAPSULE | Freq: Three times a day (TID) | ORAL | 0 refills | Status: DC | PRN

## 2016-07-25 MED ORDER — LEVOFLOXACIN 750 MG PO TABS: 750.0000 mg | ORAL_TABLET | Freq: Every day | ORAL | 0 refills | Status: DC

## 2016-07-25 NOTE — ED Notes (Signed)
Pt reports understanding of discharge information. No questions at time of discharge 

## 2016-07-25 NOTE — Progress Notes (Signed)
Wound check in clinic. Battery status: Good. R-waves 1.61m. 0 symptom episodes, 0 tachy episodes, 0 pause episodes, 0 brady episodes. 0 AF episodes (0% burden). Monthly summary reports and ROV with RU 08/14/16.

## 2016-07-25 NOTE — Discharge Instructions (Signed)
Take the antibiotics prescribed to until finished. You may take Tessalon as needed for cough. You may continue with over-the-counter cough medications as needed. Use an albuterol inhaler, one to 2 puffs every 4-6 hours, as needed for shortness of breath and wheezing. Take prednisone as prescribed until finished. You may take take Tylenol/acetaminophen for fever, headache, or body aches. Schedule close follow up with your primary care doctor for within 1 week.

## 2016-08-05 ENCOUNTER — Ambulatory Visit (INDEPENDENT_AMBULATORY_CARE_PROVIDER_SITE_OTHER): Payer: 59 | Admitting: *Deleted

## 2016-08-05 DIAGNOSIS — R002 Palpitations: Secondary | ICD-10-CM

## 2016-08-05 NOTE — Progress Notes (Signed)
Carelink Summary Report / Loop Recorder 

## 2016-08-07 ENCOUNTER — Encounter: Payer: 59 | Admitting: Nurse Practitioner

## 2016-08-07 LAB — CUP PACEART REMOTE DEVICE CHECK
Date Time Interrogation Session: 20180331213535
Implantable Pulse Generator Implant Date: 20180301

## 2016-08-13 NOTE — Progress Notes (Signed)
Cardiology Office Note Date:  08/14/2016  Patient ID:  Mathew [name], Mathew [name] 31-Jul-1950, MRN [id number] PCP:  Maggie Font, MD  Cardiologist:  Dr. Rayann Heman   Chief Complaint: f/u post ILR  History of Present Illness: Mathew [name] is a 66 y.o. male with history of PAFib, PVC's, HTN, prostate ca treated surgically, Hep C comes to the office today to be seen for Dr. Rayann Heman, last seen by him at the time of his ILR implant last month for symptoms of dizziness and pre-syncope.  His first transmission this month had no observations.  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  He had no issues with healing s/p ILR implant.   Past Medical History:  Diagnosis Date  . Hepatitis C    s/p treatment  . Hypertension   . Obstructive sleep apnea    unable to tolerate CPAP  . Premature ventricular contraction   . Prostate cancer ([place]) 2006    Past Surgical History:  Procedure Laterality Date  . LOOP RECORDER INSERTION N/A 07/04/2016   Procedure: Loop Recorder Insertion;  Surgeon: Thompson Grayer, MD;  Location: [facility];  Service: Cardiovascular;  Laterality: N/A;  . prostectomy    . SPLENECTOMY, TOTAL      Current Outpatient Prescriptions  Medication Sig Dispense Refill  . Ascorbic Acid (VITAMIN C PO) Take 1 tablet by mouth daily.    . benzonatate (TESSALON) 100 MG capsule Take 1-2 capsules (100-200 mg total) by mouth 3 (three) times daily as needed for cough. 21 capsule 0  . fluticasone (FLONASE) 50 MCG/ACT nasal spray 2 sprays by Each Nare route daily.    Marland Kitchen levofloxacin (LEVAQUIN) 750 MG tablet Take 1 tablet (750 mg total) by mouth daily. 5 tablet 0  . predniSONE (DELTASONE) 20 MG tablet Take 2 tablets (40 mg total) by mouth daily. 10 tablet 0  .  valACYclovir (VALTREX) 500 MG tablet Take 1 tablet by mouth daily as needed. For cold sore flare ups    . amLODipine (NORVASC) 5 MG tablet Take 1 tablet (5 mg total) by mouth daily. 90 tablet 3  . losartan (COZAAR) 100 MG tablet Take 1 tablet (100 mg total) by mouth daily.    . metoprolol succinate (TOPROL-XL) 100 MG 24 hr tablet Take 1 tablet (100 mg total) by mouth daily. 30 tablet 6   No current facility-administered medications for this visit.     Allergies:   Patient has no known allergies.   Social History:  The patient  reports that he has been smoking Cigarettes.  He has been smoking about 0.50 packs per day. He has never used smokeless tobacco. He reports that he drinks alcohol. He reports that he does not use drugs.   Family History:  The patient's family history is not on file.  ROS:  Please see the history of present illness.  All other systems are reviewed and otherwise negative.   PHYSICAL EXAM: VS:  BP 128/84   Pulse 82   Ht _0  (1.905 m)   Wt 210 lb (95.3 kg)   BMI 26.25 kg/m  BMI: Body mass index is 26.25 kg/m. Well nourished, well developed, in no acute distress  HEENT: normocephalic, atraumatic  Neck: no JVD, carotid bruits  or masses Cardiac:  RRR; no significant murmurs, no rubs, or gallops Lungs:  CTA b/l, no wheezing, rhonchi or rales  Abd: soft, nontender MS: no deformity or atrophy Ext: no edema  Skin: warm and dry, no rash Neuro:  No gross deficits appreciated Psych: euthymic mood, full affect   ILR site is stable, no tethering or discomfort, well healed, no erythema, edema, or sign of infection   EKG:  Done 06/26/16 showed SR ILR interrogation done today by industry and reviewed by myself, R waves 0.88V, no observations, SR today 82bpm  01/04/16; TTE LVEF 55-60%, LVH/septal hypertrophy (no LVOT obstruction is mentioned or described) Trace MR/TR LA 78m  Recent Labs: 07/24/2016: ALT 31; BUN 13; Creatinine, Ser 1.00; Hemoglobin 14.9; Platelets  174; Potassium 3.9; Sodium 135  No results found for requested labs within last 8760 hours.   Estimated Creatinine Clearance: 89.2 mL/min (by C-G formula based on SCr of 1 mg/dL).   Wt Readings from Last 3 Encounters:  08/14/16 210 lb (95.3 kg)  07/24/16 212 lb (96.2 kg)  07/04/16 211 lb (95.7 kg)     Other studies reviewed: Additional studies/records reviewed today include: summarized above  ASSESSMENT AND PLAN: 1. Paroxysmal Afib     CHA2DS2Vasc is 1 (turns 661in October)      Lengthy discussion today about life style measures and AF He has a known hx of OSA tested a couple years ago but intolerant of the CPA mask with feeling of suffocation.  He has chronic daytime fatigue and stongly urged to follow up on this and see if there are different mask styles/options he could tolerate.  He reports a couple beers on the weekends, doesn't smoke, no regular exercise I encouraged he start doing some routine exercise as well, and avoid stimulants.  He reports he has already started trying to drink more water and less coffee.  2. PVC's    Only occasional skipped beat sensation  3. Lightheaded, presyncopal spells     Encouraged adequate hydration  4. HTN     Looks OK, no changes   Disposition: he is advised as above, he seems motivated particularly to revisit his sleep apnea, will continue monthly loop transmissions, should he have symptoms instructed to call and will f/u with a loop transmission, other wise will see him back in 3 months or so, sooner if needed.  Current medicines are reviewed at length with the patient today.  The patient did not have any concerns regarding medicines.  SHaywood Lasso PA-C 08/14/2016 4:08 PM     [facility] [address] [phone](office)  [phone](fax)

## 2016-08-14 ENCOUNTER — Ambulatory Visit (INDEPENDENT_AMBULATORY_CARE_PROVIDER_SITE_OTHER): Payer: 59 | Admitting: Physician Assistant

## 2016-08-14 VITALS — BP 128/84 | HR 82 | Ht 75.0 in | Wt 210.0 lb

## 2016-08-14 DIAGNOSIS — I493 Ventricular premature depolarization: Secondary | ICD-10-CM

## 2016-08-14 DIAGNOSIS — R55 Syncope and collapse: Secondary | ICD-10-CM

## 2016-08-14 DIAGNOSIS — I10 Essential (primary) hypertension: Secondary | ICD-10-CM

## 2016-08-14 DIAGNOSIS — I480 Paroxysmal atrial fibrillation: Secondary | ICD-10-CM

## 2016-08-14 MED ORDER — LOSARTAN POTASSIUM 100 MG PO TABS: 100.0000 mg | ORAL_TABLET | Freq: Every day | ORAL | Status: DC

## 2016-08-14 MED ORDER — METOPROLOL SUCCINATE ER 100 MG PO TB24: 100.0000 mg | ORAL_TABLET | Freq: Every day | ORAL | 6 refills | Status: DC

## 2016-08-14 NOTE — Patient Instructions (Addendum)
Medication Instructions:   Your physician recommends that you continue on your current medications as directed. Please refer to the Current Medication list given to you today.  If you need a refill on your cardiac medications before your next appointment, please call your pharmacy.  Labwork: NONE ORDERED  TODAY    Testing/Procedures: NONE ORDERED  TODAY    Follow-Up: IN 3 MONTHS WITH RENEE URSUY OR ALLRED   Any Other Special Instructions Will Be Listed Below (If Applicable).

## 2016-09-02 ENCOUNTER — Ambulatory Visit (INDEPENDENT_AMBULATORY_CARE_PROVIDER_SITE_OTHER): Payer: 59 | Admitting: *Deleted

## 2016-09-02 DIAGNOSIS — R002 Palpitations: Secondary | ICD-10-CM

## 2016-09-03 NOTE — Progress Notes (Signed)
Carelink Summary Report / Loop Recorder

## 2016-09-14 LAB — CUP PACEART REMOTE DEVICE CHECK
Date Time Interrogation Session: 20180430221404
Implantable Pulse Generator Implant Date: 20180301

## 2016-09-14 NOTE — Progress Notes (Signed)
Carelink summary report received. Battery status OK. Normal device function. No new symptom episodes, tachy episodes, brady, or pause episodes. No new AF episodes. Monthly summary reports and ROV/PRN 

## 2016-09-18 ENCOUNTER — Telehealth: Payer: Self-pay | Admitting: Cardiology

## 2016-09-18 NOTE — Telephone Encounter (Signed)
Spoke w/ pt and requested that he send a manual transmission b/c his home monitor has not updated in at least 14 days.   

## 2016-10-02 ENCOUNTER — Ambulatory Visit (INDEPENDENT_AMBULATORY_CARE_PROVIDER_SITE_OTHER): Payer: 59 | Admitting: *Deleted

## 2016-10-02 DIAGNOSIS — R002 Palpitations: Secondary | ICD-10-CM | POA: Diagnosis not present

## 2016-10-03 NOTE — Progress Notes (Signed)
Carelink Summary Report

## 2016-10-05 LAB — CUP PACEART REMOTE DEVICE CHECK
Date Time Interrogation Session: 20180530221006
Implantable Pulse Generator Implant Date: 20180301

## 2016-10-05 NOTE — Progress Notes (Signed)
Carelink summary report received. Battery status OK. Normal device function. No new symptom episodes, tachy episodes, brady, or pause episodes. No new AF episodes. Monthly summary reports and ROV/PRN 

## 2016-10-24 ENCOUNTER — Encounter: Payer: Self-pay | Admitting: Internal Medicine

## 2016-11-01 ENCOUNTER — Ambulatory Visit (INDEPENDENT_AMBULATORY_CARE_PROVIDER_SITE_OTHER): Payer: 59 | Admitting: *Deleted

## 2016-11-01 DIAGNOSIS — R002 Palpitations: Secondary | ICD-10-CM

## 2016-11-04 NOTE — Progress Notes (Signed)
Carelink Summary Report / Loop Recorder 

## 2016-11-11 ENCOUNTER — Encounter (INDEPENDENT_AMBULATORY_CARE_PROVIDER_SITE_OTHER): Payer: Self-pay

## 2016-11-11 ENCOUNTER — Encounter: Payer: Self-pay | Admitting: Internal Medicine

## 2016-11-11 ENCOUNTER — Ambulatory Visit (INDEPENDENT_AMBULATORY_CARE_PROVIDER_SITE_OTHER): Payer: 59 | Admitting: Internal Medicine

## 2016-11-11 VITALS — BP 132/84 | HR 86 | Ht 75.0 in | Wt 206.4 lb

## 2016-11-11 DIAGNOSIS — I10 Essential (primary) hypertension: Secondary | ICD-10-CM

## 2016-11-11 DIAGNOSIS — I493 Ventricular premature depolarization: Secondary | ICD-10-CM

## 2016-11-11 DIAGNOSIS — I480 Paroxysmal atrial fibrillation: Secondary | ICD-10-CM | POA: Diagnosis not present

## 2016-11-11 DIAGNOSIS — R002 Palpitations: Secondary | ICD-10-CM

## 2016-11-11 DIAGNOSIS — G4733 Obstructive sleep apnea (adult) (pediatric): Secondary | ICD-10-CM

## 2016-11-11 LAB — CUP PACEART INCLINIC DEVICE CHECK
Date Time Interrogation Session: 20180709163608
Implantable Pulse Generator Implant Date: 20180301

## 2016-11-11 LAB — CUP PACEART REMOTE DEVICE CHECK
Date Time Interrogation Session: 20180629223839
Implantable Pulse Generator Implant Date: 20180301

## 2016-11-11 MED ORDER — METOPROLOL SUCCINATE ER 100 MG PO TB24: 100.0000 mg | ORAL_TABLET | Freq: Every day | ORAL | 11 refills | Status: DC

## 2016-11-11 MED ORDER — LOSARTAN POTASSIUM 100 MG PO TABS: 100.0000 mg | ORAL_TABLET | Freq: Every day | ORAL | 11 refills | Status: DC

## 2016-11-11 MED ORDER — AMLODIPINE BESYLATE 5 MG PO TABS: 5.0000 mg | ORAL_TABLET | Freq: Every day | ORAL | 11 refills | Status: DC

## 2016-11-11 NOTE — Progress Notes (Signed)
   PCP: Iona Beard, MD  Mathew [name] is a 66 y.o. male who presents today for routine electrophysiology followup.  Since last being seen in our clinic, the patient reports doing very well.  Today, he denies symptoms of palpitations, chest pain, shortness of breath,  lower extremity edema, dizziness, presyncope, or syncope.  The patient is otherwise without complaint today.   Past Medical History:  Diagnosis Date  . Hepatitis C    s/p treatment  . Hypertension   . Obstructive sleep apnea    unable to tolerate CPAP  . Premature ventricular contraction   . Prostate cancer ([place]) 2006   Past Surgical History:  Procedure Laterality Date  . LOOP RECORDER INSERTION N/A 07/04/2016   Procedure: Loop Recorder Insertion;  Surgeon: Thompson Grayer, MD;  Location: [facility];  Service: Cardiovascular;  Laterality: N/A;  . prostectomy    . SPLENECTOMY, TOTAL      ROS- all systems are reviewed and negatives except as per HPI above  Current Outpatient Prescriptions  Medication Sig Dispense Refill  . amLODipine (NORVASC) 5 MG tablet Take 1 tablet (5 mg total) by mouth daily. 90 tablet 3  . Ascorbic Acid (VITAMIN C PO) Take 1 tablet by mouth daily.    Marland Kitchen losartan (COZAAR) 100 MG tablet Take 1 tablet (100 mg total) by mouth daily.    . metoprolol succinate (TOPROL-XL) 100 MG 24 hr tablet Take 1 tablet (100 mg total) by mouth daily. 30 tablet 6  . valACYclovir (VALTREX) 500 MG tablet Take 1 tablet by mouth daily as needed. For cold sore flare ups     No current facility-administered medications for this visit.     Physical Exam: Vitals:   11/11/16 1608  BP: 132/84  Pulse: 86  SpO2: 96%  Weight: 206 lb 6.4 oz (93.6 kg)  Height: _0  (1.905 m)    GEN- The patient is well appearing, alert and oriented x 3 today.   Head- normocephalic, atraumatic Eyes-  Sclera clear, conjunctiva pink Ears- hearing intact Oropharynx- clear Lungs- Clear to ausculation bilaterally, normal work of  breathing Heart- Regular rate and rhythm, no murmurs, rubs or gallops, PMI not laterally displaced GI- soft, NT, ND, + BS Extremities- no clubbing, cyanosis, or edema  ILR interrogation ordered today is personally reviewed and shows no arrhythmias since implant 3/18  Assessment and Plan:  1. Palpitations No arrhythmias on ILR placed 07/04/16 He is instructed to use symptom activator for palpitations (which sound like PVCs)  2. Paroxysmal atrial fibrillation Well controlled off of AAD therapy chads2vasc score is 1.  Avoiding anticoagulation unless afib burden increases per patients request  3. OSA Followed by Dr Erik Obey  4. PVCs Stable As above No change required today  5. HTN Stable No change required today  6. Tobacco Cessation advised  Carelink Return to see EP NP in 6 months  Thompson Grayer MD, [facility] 11/11/2016 4:14 PM

## 2016-11-11 NOTE — Patient Instructions (Addendum)
Medication Instructions:  Your physician recommends that you continue on your current medications as directed. Please refer to the Current Medication list given to you today.   Labwork: None ordered   Testing/Procedures: None ordered   Follow-Up: Your physician wants you to follow-up in: 6 months with Chanetta Marshall, NP  You will receive a reminder letter in the mail two months in advance. If you don't receive a letter, please call our office to schedule the follow-up appointment.   Remote monitoring is used to monitor your Pacemaker  from home. This monitoring reduces the number of office visits required to check your device to one time per year. It allows [place] to keep an eye on the functioning of your device to ensure it is working properly. You are scheduled for a device check from home on 12/02/16. You may send your transmission at any time that day. If you have a wireless device, the transmission will be sent automatically. After your physician reviews your transmission, you will receive a postcard with your next transmission date.      Any Other Special Instructions Will Be Listed Below (If Applicable).  Stop smoking     If you need a refill on your cardiac medications before your next appointment, please call your pharmacy.

## 2016-12-02 ENCOUNTER — Ambulatory Visit (INDEPENDENT_AMBULATORY_CARE_PROVIDER_SITE_OTHER): Payer: Self-pay | Admitting: *Deleted

## 2016-12-02 DIAGNOSIS — R002 Palpitations: Secondary | ICD-10-CM

## 2016-12-02 NOTE — Progress Notes (Signed)
Carelink Summary Report / Loop Recorder 

## 2016-12-05 ENCOUNTER — Telehealth: Payer: Self-pay | Admitting: Cardiology

## 2016-12-05 NOTE — Telephone Encounter (Signed)
Spoke w/ pt and requested that he send a manual transmission b/c his home monitor has not updated in at least 7 days.   

## 2016-12-10 ENCOUNTER — Ambulatory Visit
Admission: RE | Admit: 2016-12-10 | Discharge: 2016-12-10 | Disposition: A | Discharge status: Home / Self Care | Payer: 59 | Source: Ambulatory Visit | Attending: Nurse Practitioner | Admitting: Nurse Practitioner

## 2016-12-10 DIAGNOSIS — K7469 Other cirrhosis of liver: Secondary | ICD-10-CM

## 2016-12-14 LAB — CUP PACEART REMOTE DEVICE CHECK
Date Time Interrogation Session: 20180729233937
Implantable Pulse Generator Implant Date: 20180301

## 2016-12-31 ENCOUNTER — Ambulatory Visit (INDEPENDENT_AMBULATORY_CARE_PROVIDER_SITE_OTHER): Payer: 59 | Admitting: *Deleted

## 2016-12-31 DIAGNOSIS — R002 Palpitations: Secondary | ICD-10-CM

## 2017-01-01 LAB — CUP PACEART REMOTE DEVICE CHECK
Date Time Interrogation Session: 20180828234851
Implantable Pulse Generator Implant Date: 20180301

## 2017-01-01 NOTE — Progress Notes (Signed)
Loop Recorder Summary Report

## 2017-01-30 ENCOUNTER — Ambulatory Visit (INDEPENDENT_AMBULATORY_CARE_PROVIDER_SITE_OTHER): Payer: 59 | Admitting: *Deleted

## 2017-01-30 DIAGNOSIS — R002 Palpitations: Secondary | ICD-10-CM | POA: Diagnosis not present

## 2017-01-31 LAB — CUP PACEART REMOTE DEVICE CHECK
Date Time Interrogation Session: 20180927234058
Implantable Pulse Generator Implant Date: 20180301

## 2017-01-31 NOTE — Progress Notes (Signed)
Carelink Summary Report / Loop Recorder

## 2017-03-03 ENCOUNTER — Ambulatory Visit (INDEPENDENT_AMBULATORY_CARE_PROVIDER_SITE_OTHER): Payer: 59 | Admitting: *Deleted

## 2017-03-03 DIAGNOSIS — R002 Palpitations: Secondary | ICD-10-CM

## 2017-03-03 NOTE — Progress Notes (Signed)
Carelink Summary Report / Loop Recorder 

## 2017-03-07 ENCOUNTER — Telehealth: Payer: Self-pay | Admitting: *Deleted

## 2017-03-07 LAB — CUP PACEART REMOTE DEVICE CHECK
Date Time Interrogation Session: 20181028001405
Implantable Pulse Generator Implant Date: 20180301

## 2017-03-07 NOTE — Telephone Encounter (Signed)
LMOVM FOR PT TO CALL BACK ABOUT CLEARANCE TO RETURN TO WORK

## 2017-03-11 ENCOUNTER — Other Ambulatory Visit: Payer: Self-pay | Admitting: Internal Medicine

## 2017-03-12 ENCOUNTER — Telehealth: Payer: Self-pay | Admitting: Cardiology

## 2017-03-12 NOTE — Telephone Encounter (Signed)
LMOVM requesting that pt send manual transmission b/c home monitor has not updated in at least 14 days.    

## 2017-03-20 ENCOUNTER — Encounter: Payer: Self-pay | Admitting: Cardiology

## 2017-03-31 ENCOUNTER — Ambulatory Visit (INDEPENDENT_AMBULATORY_CARE_PROVIDER_SITE_OTHER): Payer: 59 | Admitting: *Deleted

## 2017-03-31 DIAGNOSIS — R002 Palpitations: Secondary | ICD-10-CM

## 2017-04-01 NOTE — Progress Notes (Signed)
Carelink Summary Report / Loop Recorder 

## 2017-04-15 LAB — CUP PACEART REMOTE DEVICE CHECK
Date Time Interrogation Session: 20181127004008
Implantable Pulse Generator Implant Date: 20180301

## 2017-04-26 ENCOUNTER — Other Ambulatory Visit: Payer: Self-pay | Admitting: Internal Medicine

## 2017-04-30 ENCOUNTER — Ambulatory Visit (INDEPENDENT_AMBULATORY_CARE_PROVIDER_SITE_OTHER): Payer: 59 | Admitting: *Deleted

## 2017-04-30 DIAGNOSIS — R002 Palpitations: Secondary | ICD-10-CM

## 2017-05-01 NOTE — Progress Notes (Signed)
Carelink Summary Report / Loop Recorder 

## 2017-05-09 ENCOUNTER — Other Ambulatory Visit: Payer: Self-pay | Admitting: Nurse Practitioner

## 2017-05-09 DIAGNOSIS — K7400 Hepatic fibrosis, unspecified: Secondary | ICD-10-CM

## 2017-05-12 LAB — CUP PACEART REMOTE DEVICE CHECK
Date Time Interrogation Session: 20181227004042
Implantable Pulse Generator Implant Date: 20180301

## 2017-05-28 ENCOUNTER — Telehealth: Payer: Self-pay | Admitting: Internal Medicine

## 2017-05-28 NOTE — Telephone Encounter (Signed)
° °  [facility] Pre-operative Risk Assessment    Request for surgical clearance:  1. What type of surgery is being performed? colonoscopy  2. When is this surgery scheduled? 05/28/17  3. What type of clearance is required (medical clearance vs. Pharmacy clearance to hold med vs. Both)? Medical clearance   4. Are there any medications that need to be held prior to surgery and how long? Please indicate any medications that need to be held.   5. Practice name and name of physician performing surgery? [facility] Dr. Carol Ada    6. What is your office phone and fax number? PH# [phone], FAX# [phone]   7. Anesthesia type (None, local, MAC, general) ? Propofol   Mathew [name] 05/28/2017, 1:05 PM  _________________________________________________________________   (provider comments below)

## 2017-05-30 ENCOUNTER — Ambulatory Visit (INDEPENDENT_AMBULATORY_CARE_PROVIDER_SITE_OTHER): Payer: 59 | Admitting: *Deleted

## 2017-05-30 DIAGNOSIS — R002 Palpitations: Secondary | ICD-10-CM | POA: Diagnosis not present

## 2017-06-02 NOTE — Progress Notes (Signed)
Carelink Summary Report / Loop Recorder 

## 2017-06-04 NOTE — Telephone Encounter (Signed)
  Pre-operative Risk Assessment - Provider Statement    Patient ID:  Mathew [name], DOB: 1951-02-20, MRN: [id number]   Mr. Mertens's chart has been reviewed for pre-operative risk assessment. He has not been seen in > 6 months and is due for a follow up appointment.  Because of this patient's past medical history and time since last visit, a follow-up visit is required in order to better asses peri-operative cardiovascular risk. Pre-Op Covering Staff:  (1) Please schedule an appointment and notify patient.   (2) Please contact surgeon or referring provider via preferred method (phone, fax, etc) to notify them of the need for an office visit prior to clearance.    Signed,  Richardson Dopp, PA-C  06/04/2017 2:18 PM

## 2017-06-04 NOTE — Telephone Encounter (Signed)
Pt will need an appt before he can be cleared for his colonoscopy. Pt see's Dr. Rayann Heman, there is a recall in Epic for appt with Chanetta Marshall, NP. I s/w Lorenda Hatchet, EP scheduler who will call the pt today to let him know he is due for an appt as well he needs appt before he can be cleared for colonoscopy.

## 2017-06-04 NOTE — Telephone Encounter (Signed)
Will re route to pre-op pool

## 2017-06-10 LAB — CUP PACEART REMOTE DEVICE CHECK
Date Time Interrogation Session: 20190126004115
Implantable Pulse Generator Implant Date: 20180301

## 2017-06-11 NOTE — Progress Notes (Signed)
Electrophysiology Office Note Date: 06/13/2017  ID:  Mathew [name], DOB 1950-05-24, MRN [id number]  PCP: Mathew Beard, [name] Primary Cardiologist: Mathew Electrophysiologist: [name]  CC: AF follow up  Mathew [name] is a 67 y.o. male seen today for Mathew [name].  He presents today for routine electrophysiology followup.  He is pending colonoscopy and cardiology clearance was requested prior to procedure. Since last being seen in our clinic, the patient reports doing relatively well.  PVC burden stable. He denies chest pain, PND, orthopnea, nausea, vomiting, syncope, edema, weight gain, or early satiety.  Past Medical History:  Diagnosis Date  . Hepatitis C    s/p treatment  . Hypertension   . Obstructive sleep apnea    unable to tolerate CPAP  . Premature ventricular contraction   . Prostate cancer ([place]) 2006   Past Surgical History:  Procedure Laterality Date  . LOOP RECORDER INSERTION N/A 07/04/2016   Procedure: Loop Recorder Insertion;  Surgeon: Mathew [name];  Location: [facility];  Service: Cardiovascular;  Laterality: N/A;  . prostectomy    . SPLENECTOMY, TOTAL      Current Outpatient Medications  Medication Sig Dispense Refill  . amLODipine (NORVASC) 5 MG tablet Take 1 tablet (5 mg total) by mouth daily. 30 tablet 11  . Ascorbic Acid (VITAMIN C PO) Take 1 tablet by mouth daily.    Marland Kitchen losartan (COZAAR) 100 MG tablet Take 1 tablet (100 mg total) by mouth daily. 30 tablet 11  . metoprolol succinate (TOPROL-XL) 100 MG 24 hr tablet Take 1 tablet (100 mg total) by mouth daily. 30 tablet 11  . PRESCRIPTION MEDICATION Take 1 capsule by mouth daily. Antibiotic - unsure of name    . tolterodine (DETROL) 2 MG tablet Take 4 mg by mouth daily.    . valACYclovir (VALTREX) 500 MG tablet Take 1 tablet by mouth daily as needed. For cold sore flare ups     No current facility-administered medications for this visit.     Allergies:   Patient has no known allergies.   Social  History: Social History   Socioeconomic History  . Marital status: Married    Spouse name: Not on file  . Number of children: Not on file  . Years of education: Not on file  . Highest education level: Not on file  Social Needs  . Financial resource strain: Not on file  . Food insecurity - worry: Not on file  . Food insecurity - inability: Not on file  . Transportation needs - medical: Not on file  . Transportation needs - non-medical: Not on file  Occupational History  . Not on file  Tobacco Use  . Smoking status: Current Every Day Smoker    Packs/day: 0.50    Types: Cigarettes  . Smokeless tobacco: Never Used  Substance and Sexual Activity  . Alcohol use: Yes    Comment: weekends  . Drug use: No  . Sexual activity: Not on file  Other Topics Concern  . Not on file  Social History Narrative   Pt lives in [place] with spouse.  3 children.  Works as a Administrator.    Family History: Family History  Problem Relation Age of Onset  . Heart attack Neg Hx     Review of Systems: All other systems reviewed and are otherwise negative except as noted above.   Physical Exam: VS:  BP (!) 147/99   Pulse 81   Ht 6' (1.829 m)   [facility]  213 lb (96.6 kg)   BMI 28.89 kg/m  , BMI Body mass index is 28.89 kg/m. Wt Readings from Last 3 Encounters:  06/12/17 213 lb (96.6 kg)  11/11/16 206 lb 6.4 oz (93.6 kg)  08/14/16 210 lb (95.3 kg)    GEN- The patient is well appearing, alert and oriented x 3 today.   HEENT: normocephalic, atraumatic; sclera clear, conjunctiva pink; hearing intact; oropharynx clear; neck supple  Lungs- Clear to ausculation bilaterally, normal work of breathing.  No wheezes, rales, rhonchi Heart- Regular rate and rhythm  GI- soft, non-tender, non-distended, bowel sounds present  Extremities- no clubbing, cyanosis, or edema  MS- no significant deformity or atrophy Skin- warm and dry, no rash or lesion  Psych- euthymic mood, full affect Neuro- strength and  sensation are intact  Recent Labs: 07/24/2016: ALT 31; BUN 13; Creatinine, Ser 1.00; Hemoglobin 14.9; Platelets 174; Potassium 3.9; Sodium 135    Other studies Reviewed: Additional studies/ records that were reviewed today include: Mathew Mathew [name] office notes  Assessment and Plan: 1.  Paroxysmal atrial fibrillation Burden by ILR interrogation 0% CHADS2VASC is now 2 - Mathew [name] deferred per patient request. He has had problems with hematuria in the past.   2.  PVC's Stable No change required today  3.  HTN Stable No change required today  4.  OSA Followed by Mathew Mathew [name]  5.  Surgical clearance He is able to perform 25mts without chest pain or shortness of breath. No further testing required before colonoscopy  Echo 2017 normal EF  Current medicines are reviewed at length with the patient today.   The patient does not have concerns regarding his medicines.  The following changes were made today:  none  Labs/ tests ordered today include: none Orders Placed This Encounter  Procedures  . EKG 12-Lead     Disposition:   Follow up with Mathew ARayann Heman6 months   Signed, Mathew Marshall NP 06/13/2017 11:46 AM   [place] [phone](office) [phone](fax)

## 2017-06-12 ENCOUNTER — Encounter: Payer: Self-pay | Admitting: Nurse Practitioner

## 2017-06-12 ENCOUNTER — Encounter (INDEPENDENT_AMBULATORY_CARE_PROVIDER_SITE_OTHER): Payer: Self-pay

## 2017-06-12 ENCOUNTER — Ambulatory Visit: Payer: 59 | Admitting: Nurse Practitioner

## 2017-06-12 VITALS — BP 147/99 | HR 81 | Ht 72.0 in | Wt 213.0 lb

## 2017-06-12 DIAGNOSIS — I10 Essential (primary) hypertension: Secondary | ICD-10-CM

## 2017-06-12 DIAGNOSIS — Z01818 Encounter for other preprocedural examination: Secondary | ICD-10-CM | POA: Diagnosis not present

## 2017-06-12 DIAGNOSIS — I493 Ventricular premature depolarization: Secondary | ICD-10-CM | POA: Diagnosis not present

## 2017-06-12 DIAGNOSIS — I480 Paroxysmal atrial fibrillation: Secondary | ICD-10-CM | POA: Diagnosis not present

## 2017-06-12 DIAGNOSIS — G4733 Obstructive sleep apnea (adult) (pediatric): Secondary | ICD-10-CM

## 2017-06-12 NOTE — Patient Instructions (Addendum)
Medication Instructions:   Your physician recommends that you continue on your current medications as directed. Please refer to the Current Medication list given to you today.   If you need a refill on your cardiac medications before your next appointment, please call your pharmacy.  Labwork:NONE ORDERED  TODAY    Testing/Procedures: NONE ORDERED  TODAY    Follow-Up:  Your physician wants you to follow-up in:  [place] will receive a reminder letter in the mail two months in advance. If you don't receive a letter, please call our office to schedule the follow-up appointment.  Remote monitoring is used to monitor your Pacemaker of ICD from home. This monitoring reduces the number of office visits required to check your device to one time per year. It allows [place] to keep an eye on the functioning of your device to ensure it is working properly. You are scheduled for a device check from home on . 07-02-17  You may send your transmission at any time that day. If you have a wireless device, the transmission will be sent automatically. After your physician reviews your transmission, you will receive a postcard with your next transmission date.     Any Other Special Instructions Will Be Listed Below (If Applicable).

## 2017-07-02 ENCOUNTER — Ambulatory Visit (INDEPENDENT_AMBULATORY_CARE_PROVIDER_SITE_OTHER): Payer: 59 | Admitting: *Deleted

## 2017-07-02 DIAGNOSIS — R002 Palpitations: Secondary | ICD-10-CM | POA: Diagnosis not present

## 2017-07-03 NOTE — Progress Notes (Signed)
Carelink Summary Report / Loop Recorder 

## 2017-07-07 ENCOUNTER — Ambulatory Visit
Admission: RE | Admit: 2017-07-07 | Discharge: 2017-07-07 | Disposition: A | Discharge status: Home / Self Care | Payer: 59 | Source: Ambulatory Visit | Attending: Nurse Practitioner | Admitting: Nurse Practitioner

## 2017-07-07 DIAGNOSIS — K7400 Hepatic fibrosis, unspecified: Secondary | ICD-10-CM

## 2017-08-04 ENCOUNTER — Ambulatory Visit (INDEPENDENT_AMBULATORY_CARE_PROVIDER_SITE_OTHER): Payer: 59 | Admitting: *Deleted

## 2017-08-04 DIAGNOSIS — R002 Palpitations: Secondary | ICD-10-CM | POA: Diagnosis not present

## 2017-08-05 NOTE — Progress Notes (Signed)
Carelink Summary Reprot / Loop Recorder 

## 2017-08-09 LAB — CUP PACEART REMOTE DEVICE CHECK
Date Time Interrogation Session: 20190228011053
Implantable Pulse Generator Implant Date: 20180301

## 2017-09-08 ENCOUNTER — Ambulatory Visit (INDEPENDENT_AMBULATORY_CARE_PROVIDER_SITE_OTHER): Payer: 59 | Admitting: *Deleted

## 2017-09-08 DIAGNOSIS — R002 Palpitations: Secondary | ICD-10-CM | POA: Diagnosis not present

## 2017-09-08 LAB — CUP PACEART REMOTE DEVICE CHECK
Date Time Interrogation Session: 20190402014025
Implantable Pulse Generator Implant Date: 20180301

## 2017-09-08 NOTE — Progress Notes (Signed)
Carelink Summary Report / Loop Recorder 

## 2017-09-30 LAB — CUP PACEART REMOTE DEVICE CHECK
Date Time Interrogation Session: 20190505014054
Implantable Pulse Generator Implant Date: 20180301

## 2017-10-09 ENCOUNTER — Ambulatory Visit (INDEPENDENT_AMBULATORY_CARE_PROVIDER_SITE_OTHER): Payer: 59 | Admitting: *Deleted

## 2017-10-09 DIAGNOSIS — R002 Palpitations: Secondary | ICD-10-CM | POA: Diagnosis not present

## 2017-10-09 NOTE — Progress Notes (Signed)
Carelink Summary Report / Loop Recorder

## 2017-11-10 ENCOUNTER — Ambulatory Visit (INDEPENDENT_AMBULATORY_CARE_PROVIDER_SITE_OTHER): Payer: 59 | Admitting: *Deleted

## 2017-11-10 DIAGNOSIS — R002 Palpitations: Secondary | ICD-10-CM

## 2017-11-12 DIAGNOSIS — R002 Palpitations: Secondary | ICD-10-CM

## 2017-11-12 NOTE — Progress Notes (Signed)
Carelink Summary Report / Loop Recorder

## 2017-11-17 LAB — CUP PACEART REMOTE DEVICE CHECK
Date Time Interrogation Session: 20190607020929
Implantable Pulse Generator Implant Date: 20180301

## 2017-12-07 ENCOUNTER — Other Ambulatory Visit: Payer: Self-pay | Admitting: Internal Medicine

## 2017-12-09 ENCOUNTER — Other Ambulatory Visit: Payer: Self-pay | Admitting: Internal Medicine

## 2017-12-11 ENCOUNTER — Telehealth: Payer: Self-pay | Admitting: Internal Medicine

## 2017-12-11 NOTE — Telephone Encounter (Signed)
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 Pt not due to see Allred until 06-2018.

## 2017-12-15 ENCOUNTER — Ambulatory Visit (INDEPENDENT_AMBULATORY_CARE_PROVIDER_SITE_OTHER): Payer: 59 | Admitting: *Deleted

## 2017-12-15 DIAGNOSIS — R002 Palpitations: Secondary | ICD-10-CM | POA: Diagnosis not present

## 2017-12-15 NOTE — Progress Notes (Signed)
Carelink Summary Report / Loop Recorder

## 2017-12-16 ENCOUNTER — Telehealth: Payer: Self-pay

## 2017-12-16 DIAGNOSIS — I493 Ventricular premature depolarization: Secondary | ICD-10-CM

## 2017-12-16 DIAGNOSIS — I480 Paroxysmal atrial fibrillation: Secondary | ICD-10-CM

## 2017-12-16 NOTE — Telephone Encounter (Signed)
Order entered for stress test.  Pt to contact office when he finds out when he needs the stress test.  Left CVM per DPR-advised order for stress test has been entered and Pt will receive a call to schedule.

## 2017-12-16 NOTE — Telephone Encounter (Signed)
Ok to proceed with stress testing.

## 2017-12-18 LAB — CUP PACEART REMOTE DEVICE CHECK
Date Time Interrogation Session: 20190710020824
Implantable Pulse Generator Implant Date: 20180301

## 2017-12-24 ENCOUNTER — Other Ambulatory Visit: Payer: Self-pay | Admitting: Nurse Practitioner

## 2017-12-24 DIAGNOSIS — K7400 Hepatic fibrosis, unspecified: Secondary | ICD-10-CM

## 2017-12-29 ENCOUNTER — Encounter: Payer: 59 | Admitting: Internal Medicine

## 2018-01-12 ENCOUNTER — Ambulatory Visit
Admission: RE | Admit: 2018-01-12 | Discharge: 2018-01-12 | Disposition: A | Discharge status: Home / Self Care | Payer: 59 | Source: Ambulatory Visit | Attending: Nurse Practitioner | Admitting: Nurse Practitioner

## 2018-01-12 DIAGNOSIS — K7400 Hepatic fibrosis, unspecified: Secondary | ICD-10-CM

## 2018-01-16 ENCOUNTER — Ambulatory Visit (INDEPENDENT_AMBULATORY_CARE_PROVIDER_SITE_OTHER): Payer: 59 | Admitting: *Deleted

## 2018-01-16 DIAGNOSIS — R002 Palpitations: Secondary | ICD-10-CM | POA: Diagnosis not present

## 2018-01-17 NOTE — Progress Notes (Signed)
Carelink Summary Report / Loop Recorder 

## 2018-01-21 LAB — CUP PACEART REMOTE DEVICE CHECK
Date Time Interrogation Session: 20190812023701
Implantable Pulse Generator Implant Date: 20180301

## 2018-01-31 LAB — CUP PACEART REMOTE DEVICE CHECK
Date Time Interrogation Session: 20190914044344
Implantable Pulse Generator Implant Date: 20180301

## 2018-02-17 ENCOUNTER — Telehealth (HOSPITAL_COMMUNITY): Payer: Self-pay | Admitting: *Deleted

## 2018-02-17 NOTE — Telephone Encounter (Signed)
Left message on voicemail per DPR in reference to upcoming appointment scheduled on 02/20/18 at 0800 with detailed instructions given per Myocardial Perfusion Study Information Sheet for the test. LM to arrive 15 minutes early, and that it is imperative to arrive on time for appointment to keep from having the test rescheduled. If you need to cancel or reschedule your appointment, please call the office within 24 hours of your appointment. Failure to do so may result in a cancellation of your appointment, and a $50 no show fee. Phone number given for call back for any questions. Shonnie Poudrier, Ranae Palms

## 2018-02-19 ENCOUNTER — Ambulatory Visit (INDEPENDENT_AMBULATORY_CARE_PROVIDER_SITE_OTHER): Payer: 59 | Admitting: *Deleted

## 2018-02-19 DIAGNOSIS — R002 Palpitations: Secondary | ICD-10-CM

## 2018-02-19 DIAGNOSIS — R079 Chest pain, unspecified: Secondary | ICD-10-CM

## 2018-02-19 NOTE — Progress Notes (Signed)
Carelink Summary Report / Loop Recorder

## 2018-02-20 ENCOUNTER — Encounter (HOSPITAL_COMMUNITY): Payer: 59

## 2018-02-23 ENCOUNTER — Telehealth: Payer: Self-pay

## 2018-02-23 DIAGNOSIS — I480 Paroxysmal atrial fibrillation: Secondary | ICD-10-CM

## 2018-02-23 DIAGNOSIS — I493 Ventricular premature depolarization: Secondary | ICD-10-CM

## 2018-02-23 DIAGNOSIS — Z01818 Encounter for other preprocedural examination: Secondary | ICD-10-CM

## 2018-02-23 NOTE — Telephone Encounter (Signed)
Order changed from nuclear test to GXT.  Pt will be called to schedule.

## 2018-03-02 ENCOUNTER — Other Ambulatory Visit: Payer: Self-pay | Admitting: Internal Medicine

## 2018-03-02 ENCOUNTER — Ambulatory Visit (INDEPENDENT_AMBULATORY_CARE_PROVIDER_SITE_OTHER): Payer: 59

## 2018-03-02 DIAGNOSIS — I493 Ventricular premature depolarization: Secondary | ICD-10-CM

## 2018-03-02 DIAGNOSIS — I480 Paroxysmal atrial fibrillation: Secondary | ICD-10-CM

## 2018-03-02 DIAGNOSIS — R079 Chest pain, unspecified: Secondary | ICD-10-CM

## 2018-03-02 LAB — EXERCISE TOLERANCE TEST
Estimated workload: 8.4 METS
Exercise duration (min): 6 min
Exercise duration (sec): 56 s
MPHR: 154 {beats}/min
Peak HR: 133 {beats}/min
Percent HR: 86 %
RPE: 15
Rest HR: 69 {beats}/min

## 2018-03-03 ENCOUNTER — Telehealth: Payer: Self-pay | Admitting: Internal Medicine

## 2018-03-03 DIAGNOSIS — I480 Paroxysmal atrial fibrillation: Secondary | ICD-10-CM

## 2018-03-03 DIAGNOSIS — I10 Essential (primary) hypertension: Secondary | ICD-10-CM

## 2018-03-03 DIAGNOSIS — Z024 Encounter for examination for driving license: Secondary | ICD-10-CM

## 2018-03-03 LAB — CUP PACEART REMOTE DEVICE CHECK
Date Time Interrogation Session: 20191017114018
Implantable Pulse Generator Implant Date: 20180301

## 2018-03-03 NOTE — Telephone Encounter (Signed)
New Message:    Pt says he needs an order for an Echo for tomorrow. 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 Please call him asap, he will need to take off from work please.

## 2018-03-03 NOTE — Telephone Encounter (Signed)
Left message to call back  

## 2018-03-04 NOTE — Telephone Encounter (Signed)
Returned call to Pt.  Pt was a little difficult to understand, what this nurse understood was the physician who will be clearing him to keep his license is requesting Pt also have an ECHO. He has 90 days to complete this.  This nurse also understood that the clearing physician thinks his loop recorder is an ICD/pacemaker.  Per Pt this physician is located at a [facility] on [address] phone # [phone].   Pt did not know name of facility or doctor.  Pt requesting an ECHO be ordered asap.  Will order echo and place call to number provided.  Call placed to [facility].  Per center, need ECHO with EF to approve Pt license. Will order.

## 2018-03-20 ENCOUNTER — Telehealth: Payer: Self-pay

## 2018-03-20 DIAGNOSIS — I480 Paroxysmal atrial fibrillation: Secondary | ICD-10-CM

## 2018-03-20 DIAGNOSIS — Z024 Encounter for examination for driving license: Secondary | ICD-10-CM

## 2018-03-20 DIAGNOSIS — I493 Ventricular premature depolarization: Secondary | ICD-10-CM

## 2018-03-20 NOTE — Telephone Encounter (Signed)
Spoke with Pt.  Pt needs to schedule ECHO.  Advised he would receive a call this afternoon to his cell phone (best contact) to schedule ECHO.

## 2018-03-20 NOTE — Telephone Encounter (Signed)
New message    Just an FYI. We have made several attempts to contact this patient including sending a letter to schedule or reschedule their echocardiogram. We will be removing the patient from the echo WQ.   Thank you

## 2018-03-24 ENCOUNTER — Ambulatory Visit (INDEPENDENT_AMBULATORY_CARE_PROVIDER_SITE_OTHER): Payer: 59

## 2018-03-24 DIAGNOSIS — R002 Palpitations: Secondary | ICD-10-CM | POA: Diagnosis not present

## 2018-03-24 NOTE — Progress Notes (Signed)
Carelink Summary Report / Loop Recorder

## 2018-03-26 ENCOUNTER — Encounter (HOSPITAL_COMMUNITY): Payer: Self-pay | Admitting: Radiology

## 2018-03-26 ENCOUNTER — Other Ambulatory Visit: Payer: Self-pay

## 2018-03-26 ENCOUNTER — Ambulatory Visit (HOSPITAL_COMMUNITY): Payer: 59 | Attending: Cardiovascular Disease

## 2018-03-26 DIAGNOSIS — Z024 Encounter for examination for driving license: Secondary | ICD-10-CM

## 2018-03-26 DIAGNOSIS — I480 Paroxysmal atrial fibrillation: Secondary | ICD-10-CM

## 2018-03-26 DIAGNOSIS — I493 Ventricular premature depolarization: Secondary | ICD-10-CM

## 2018-03-26 NOTE — Progress Notes (Unsigned)
Patient ID: Mathew [name], male   DOB: [birth year], 67 y.o.   MRN: [id number] Documentation opened in error

## 2018-03-31 ENCOUNTER — Telehealth: Payer: Self-pay

## 2018-03-31 NOTE — Telephone Encounter (Signed)
-----  Message from Thompson Grayer, MD sent at 03/29/2018  8:56 PM EST ----- Results reviewed.  Sonia Baller, please inform pt of result.

## 2018-03-31 NOTE — Telephone Encounter (Signed)
Call placed to Pt.  Advised ECHO results were received.  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  ECHO results faxed as requested.  No further action at this time.

## 2018-04-27 ENCOUNTER — Ambulatory Visit (INDEPENDENT_AMBULATORY_CARE_PROVIDER_SITE_OTHER): Payer: 59

## 2018-04-27 DIAGNOSIS — R002 Palpitations: Secondary | ICD-10-CM

## 2018-04-27 LAB — CUP PACEART REMOTE DEVICE CHECK
Date Time Interrogation Session: 20191222133843
Implantable Pulse Generator Implant Date: 20180301

## 2018-04-27 NOTE — Progress Notes (Signed)
Carelink Summary Report / Loop Recorder

## 2018-05-17 LAB — CUP PACEART REMOTE DEVICE CHECK
Date Time Interrogation Session: 20191119131127
Implantable Pulse Generator Implant Date: 20180301

## 2018-05-29 ENCOUNTER — Ambulatory Visit (INDEPENDENT_AMBULATORY_CARE_PROVIDER_SITE_OTHER): Payer: Self-pay

## 2018-05-29 DIAGNOSIS — R002 Palpitations: Secondary | ICD-10-CM

## 2018-05-30 LAB — CUP PACEART REMOTE DEVICE CHECK
Date Time Interrogation Session: 20200124133742
Implantable Pulse Generator Implant Date: 20180301

## 2018-06-01 NOTE — Progress Notes (Signed)
Carelink Summary Report / Loop Recorder

## 2018-06-09 ENCOUNTER — Other Ambulatory Visit: Payer: Self-pay | Admitting: Internal Medicine

## 2018-06-10 ENCOUNTER — Other Ambulatory Visit: Payer: Self-pay | Admitting: Internal Medicine

## 2018-06-12 ENCOUNTER — Emergency Department (HOSPITAL_COMMUNITY): Payer: BLUE CROSS/BLUE SHIELD

## 2018-06-12 ENCOUNTER — Encounter (HOSPITAL_COMMUNITY): Payer: Self-pay

## 2018-06-12 ENCOUNTER — Other Ambulatory Visit: Payer: Self-pay

## 2018-06-12 ENCOUNTER — Emergency Department (HOSPITAL_COMMUNITY)
Admission: EM | Admit: 2018-06-12 | Discharge: 2018-06-12 | Disposition: A | Discharge status: Home / Self Care | Payer: BLUE CROSS/BLUE SHIELD | Attending: Emergency Medicine | Admitting: Emergency Medicine

## 2018-06-12 DIAGNOSIS — R079 Chest pain, unspecified: Secondary | ICD-10-CM

## 2018-06-12 DIAGNOSIS — R067 Sneezing: Secondary | ICD-10-CM | POA: Diagnosis not present

## 2018-06-12 DIAGNOSIS — F17210 Nicotine dependence, cigarettes, uncomplicated: Secondary | ICD-10-CM | POA: Insufficient documentation

## 2018-06-12 DIAGNOSIS — R0789 Other chest pain: Secondary | ICD-10-CM | POA: Insufficient documentation

## 2018-06-12 DIAGNOSIS — Z8546 Personal history of malignant neoplasm of prostate: Secondary | ICD-10-CM | POA: Insufficient documentation

## 2018-06-12 DIAGNOSIS — I10 Essential (primary) hypertension: Secondary | ICD-10-CM | POA: Insufficient documentation

## 2018-06-12 LAB — CBC WITH DIFFERENTIAL/PLATELET
Abs Immature Granulocytes: 0.04 10*3/uL (ref 0.00–0.07)
Basophils Absolute: 0.2 10*3/uL — ABNORMAL HIGH (ref 0.0–0.1)
Basophils Relative: 1 %
Eosinophils Absolute: 0.6 10*3/uL — ABNORMAL HIGH (ref 0.0–0.5)
Eosinophils Relative: 5 %
HCT: 46.7 % (ref 39.0–52.0)
Hemoglobin: 15 g/dL (ref 13.0–17.0)
Immature Granulocytes: 0 %
Lymphocytes Relative: 34 %
Lymphs Abs: 4.5 10*3/uL — ABNORMAL HIGH (ref 0.7–4.0)
MCH: 29.4 pg (ref 26.0–34.0)
MCHC: 32.1 g/dL (ref 30.0–36.0)
MCV: 91.6 fL (ref 80.0–100.0)
Monocytes Absolute: 1.2 10*3/uL — ABNORMAL HIGH (ref 0.1–1.0)
Monocytes Relative: 9 %
Neutro Abs: 6.8 10*3/uL (ref 1.7–7.7)
Neutrophils Relative %: 51 %
Platelets: 205 10*3/uL (ref 150–400)
RBC: 5.1 MIL/uL (ref 4.22–5.81)
RDW: 15.1 % (ref 11.5–15.5)
WBC: 13.3 10*3/uL — ABNORMAL HIGH (ref 4.0–10.5)
nRBC: 0 % (ref 0.0–0.2)

## 2018-06-12 LAB — COMPREHENSIVE METABOLIC PANEL
ALT: 22 U/L (ref 0–44)
AST: 20 U/L (ref 15–41)
Albumin: 4.4 g/dL (ref 3.5–5.0)
Alkaline Phosphatase: 60 U/L (ref 38–126)
Anion gap: 6 (ref 5–15)
BUN: 10 mg/dL (ref 8–23)
CO2: 24 mmol/L (ref 22–32)
Calcium: 9.1 mg/dL (ref 8.9–10.3)
Chloride: 103 mmol/L (ref 98–111)
Creatinine, Ser: 0.74 mg/dL (ref 0.61–1.24)
GFR calc Af Amer: >60 mL/min (ref >60)
GFR calc non Af Amer: >60 mL/min (ref >60)
Glucose, Bld: 93 mg/dL (ref 70–99)
Potassium: 3.5 mmol/L (ref 3.5–5.1)
Sodium: 133 mmol/L — ABNORMAL LOW (ref 135–145)
Total Bilirubin: 0.8 mg/dL (ref 0.3–1.2)
Total Protein: 7.9 g/dL (ref 6.5–8.1)

## 2018-06-12 LAB — TROPONIN I: Troponin I: <0.03 ng/mL (ref <0.03)

## 2018-06-12 MED ORDER — FENTANYL CITRATE (PF) 100 MCG/2ML IJ SOLN
50.0000 ug | Freq: Once | INTRAMUSCULAR | Status: AC
  Administered 2018-06-12: 50 ug via INTRAVENOUS
  Filled 2018-06-12: qty 2

## 2018-06-12 MED ORDER — KETOROLAC TROMETHAMINE 30 MG/ML IJ SOLN
15.0000 mg | Freq: Once | INTRAMUSCULAR | Status: AC
  Administered 2018-06-12: 15 mg via INTRAVENOUS
  Filled 2018-06-12: qty 1

## 2018-06-12 NOTE — ED Notes (Signed)
MD at bedside states we do not need IV or blood work at this time. Will continue to monitor.

## 2018-06-12 NOTE — ED Provider Notes (Signed)
Emergency Department Provider Note   I have reviewed the triage vital signs and the nursing notes.   HISTORY  Chief Complaint Chest Pain   HPI Mathew [name] is a 68 y.o. male with medical problems as documented below the presents to the emergency department today secondary to left-sided chest pain.  Patient states that he has some coughing and sneezing yesterday and had acute left costochondral pain since then. Mild sob but not exertional. No LE swelling, diaphoresis or light headedness.  Patient continues to have some chest tenderness there. No other associated or modifying symptoms.    Past Medical History:  Diagnosis Date  . Hepatitis C    s/p treatment  . Hypertension   . Obstructive sleep apnea    unable to tolerate CPAP  . Premature ventricular contraction   . Prostate cancer ([place]) 2006    There are no active problems to display for this patient.   Past Surgical History:  Procedure Laterality Date  . LOOP RECORDER INSERTION N/A 07/04/2016   Procedure: Loop Recorder Insertion;  Surgeon: Thompson Grayer, MD;  Location: [facility];  Service: Cardiovascular;  Laterality: N/A;  . prostectomy    . SPLENECTOMY, TOTAL      Current Outpatient Rx  . Order #: [id number] Class: Normal  . Order #: [id number] Class: Normal  . Order #: [id number] Class: Normal  . Order #: [id number] Class: Historical Med  . Order #: [id number] Class: Historical Med    Allergies Patient has no known allergies.  Family History  Problem Relation Age of Onset  . Heart attack Neg Hx     Social History Social History   Tobacco Use  . Smoking status: Current Every Day Smoker    Packs/day: 0.50    Types: Cigarettes  . Smokeless tobacco: Never Used  Substance Use Topics  . Alcohol use: Yes    Comment: weekends  . Drug use: No    Review of Systems  All other systems negative except as documented in the HPI. All pertinent positives and negatives as reviewed in the  HPI. ____________________________________________   PHYSICAL EXAM:  VITAL SIGNS: ED Triage Vitals  Enc Vitals Group     BP 06/12/18 0733 (!) 151/99     Pulse Rate 06/12/18 0733 70     Resp 06/12/18 0733 14     Temp 06/12/18 0733 98.3 F (36.8 C)     Temp Source 06/12/18 0733 Oral     SpO2 06/12/18 0733 93 %     Weight 06/12/18 0731 205 lb (93 kg)     Height 06/12/18 0731 _0  (1.88 m)    Constitutional: Alert and oriented. Well appearing and in no acute distress. Eyes: Conjunctivae are normal. PERRL. EOMI. Head: Atraumatic. Nose: No congestion/rhinnorhea. Mouth/Throat: Mucous membranes are moist.  Oropharynx non-erythematous. Neck: No stridor.  No meningeal signs.   Cardiovascular: Normal rate, regular rhythm. Good peripheral circulation. Grossly normal heart sounds.   Respiratory: Normal respiratory effort.  No retractions. Lungs with mild end expiratory wheezing. Gastrointestinal: Soft and nontender. No distention.  Musculoskeletal: No lower extremity tenderness nor edema. No gross deformities of extremities.  Mild tenderness left costochondral . Neurologic:  Normal speech and language. No gross focal neurologic deficits are appreciated.  Skin:  Skin is warm, dry and intact. No rash noted.   ____________________________________________   LABS (all labs ordered are listed, but only abnormal results are displayed)  Labs Reviewed  CBC WITH DIFFERENTIAL/PLATELET - Abnormal; Notable for the following components:  Result Value   WBC 13.3 (*)    Lymphs Abs 4.5 (*)    Monocytes Absolute 1.2 (*)    Eosinophils Absolute 0.6 (*)    Basophils Absolute 0.2 (*)    All other components within normal limits  COMPREHENSIVE METABOLIC PANEL - Abnormal; Notable for the following components:   Sodium 133 (*)    All other components within normal limits  TROPONIN I   ____________________________________________  EKG   EKG Interpretation  Date/Time:  Friday June 12 2018  07:34:05 EST Ventricular Rate:  67 PR Interval:    QRS Duration: 94 QT Interval:  397 QTC Calculation: 420 R Axis:   36 Text Interpretation:  Sinus rhythm very similar to most recent Confirmed by Merrily Pew [phone]) on 06/12/2018 8:01:49 AM       EKG Interpretation  Date/Time:  Friday June 12 2018 08:18:55 EST Ventricular Rate:  69 PR Interval:    QRS Duration: 90 QT Interval:  393 QTC Calculation: 421 R Axis:   41 Text Interpretation:  Sinus rhythm Borderline prolonged PR interval No significant change since last tracing Confirmed by Merrily Pew [phone]) on 06/12/2018 9:27:52 AM        ____________________________________________  RADIOLOGY  Dg Chest 2 View  Result Date: 06/12/2018 CLINICAL DATA:  Chest pain.  History of atrial fibrillation EXAM: CHEST - 2 VIEW COMPARISON:  July 24, 2016 FINDINGS: There is no edema or consolidation. The heart size and pulmonary vascularity are normal. No adenopathy. There is aortic atherosclerosis. A loop recorder is noted on the left anteriorly and inferiorly. No bone lesions. IMPRESSION: No edema or consolidation. Heart size within normal limits. There is aortic atherosclerosis. Electronically Signed   By: Lowella Grip III M.D.   On: 06/12/2018 08:47    ____________________________________________   PROCEDURES  Procedure(s) performed:   Procedures   ____________________________________________   INITIAL IMPRESSION / ASSESSMENT AND PLAN / ED COURSE  Sounds very much like costochondritis however since it happened while he sneezing against a closed mouth with center pneumothorax as a cause so we will get a chest x-ray.  His EKG shows some mild abnormalities different than before nothing acute we will check basic labs but likely discharge.  ecg repeat similar to previous, suspect first one was artifact. Labs ok. No PTX on cxr. Plan for dc w/ pcp follow up.     Pertinent labs & imaging results that were available during  my care of the patient were reviewed by me and considered in my medical decision making (see chart for details).  ____________________________________________  FINAL CLINICAL IMPRESSION(S) / ED DIAGNOSES  Final diagnoses:  Nonspecific chest pain  Sneezing     MEDICATIONS GIVEN DURING THIS VISIT:  Medications  ketorolac (TORADOL) 30 MG/ML injection 15 mg (15 mg Intravenous Given 06/12/18 0846)  fentaNYL (SUBLIMAZE) injection 50 mcg (50 mcg Intravenous Given 06/12/18 0846)     NEW OUTPATIENT MEDICATIONS STARTED DURING THIS VISIT:  New Prescriptions   No medications on file    Note:  This note was prepared with assistance of Dragon voice recognition software. Occasional wrong-word or sound-a-like substitutions may have occurred due to the inherent limitations of voice recognition software.   Cordella Nyquist, Corene Cornea, MD 06/12/18 [phone]

## 2018-06-12 NOTE — ED Triage Notes (Signed)
Pt states that he sneezed last night and had a pain in his chest. Pt states that he has a hx of a fib. Pt states that he had a hard time sleeping last night due to pain. Pt states it feels like a knot on his left chest

## 2018-06-12 NOTE — ED Notes (Signed)
Bed: WA08 Expected date:  Expected time:  Means of arrival:  Comments:

## 2018-07-01 ENCOUNTER — Ambulatory Visit (INDEPENDENT_AMBULATORY_CARE_PROVIDER_SITE_OTHER): Payer: BLUE CROSS/BLUE SHIELD | Admitting: *Deleted

## 2018-07-01 DIAGNOSIS — R002 Palpitations: Secondary | ICD-10-CM

## 2018-07-02 LAB — CUP PACEART REMOTE DEVICE CHECK
Date Time Interrogation Session: 20200226134039
Implantable Pulse Generator Implant Date: 20180301

## 2018-07-08 NOTE — Progress Notes (Signed)
Carelink Summary Report / Loop Recorder

## 2018-07-12 ENCOUNTER — Other Ambulatory Visit: Payer: Self-pay | Admitting: Internal Medicine

## 2018-07-13 ENCOUNTER — Other Ambulatory Visit: Payer: Self-pay | Admitting: Internal Medicine

## 2018-08-03 ENCOUNTER — Other Ambulatory Visit: Payer: Self-pay

## 2018-08-03 ENCOUNTER — Ambulatory Visit (INDEPENDENT_AMBULATORY_CARE_PROVIDER_SITE_OTHER): Payer: BLUE CROSS/BLUE SHIELD | Admitting: *Deleted

## 2018-08-03 DIAGNOSIS — I480 Paroxysmal atrial fibrillation: Secondary | ICD-10-CM

## 2018-08-03 DIAGNOSIS — R002 Palpitations: Secondary | ICD-10-CM

## 2018-08-03 LAB — CUP PACEART REMOTE DEVICE CHECK
Date Time Interrogation Session: 20200330154017
Implantable Pulse Generator Implant Date: 20180301

## 2018-08-10 NOTE — Progress Notes (Signed)
Carelink Summary Report / Loop Recorder

## 2018-08-10 NOTE — Progress Notes (Signed)
No letter needed

## 2018-08-15 ENCOUNTER — Other Ambulatory Visit: Payer: Self-pay | Admitting: Internal Medicine

## 2018-08-17 ENCOUNTER — Other Ambulatory Visit: Payer: Self-pay | Admitting: Internal Medicine

## 2018-09-14 ENCOUNTER — Other Ambulatory Visit: Payer: Self-pay

## 2018-09-14 ENCOUNTER — Ambulatory Visit (INDEPENDENT_AMBULATORY_CARE_PROVIDER_SITE_OTHER): Payer: BLUE CROSS/BLUE SHIELD | Admitting: *Deleted

## 2018-09-14 DIAGNOSIS — I480 Paroxysmal atrial fibrillation: Secondary | ICD-10-CM

## 2018-09-14 DIAGNOSIS — R002 Palpitations: Secondary | ICD-10-CM | POA: Diagnosis not present

## 2018-09-14 LAB — CUP PACEART REMOTE DEVICE CHECK
Date Time Interrogation Session: 20200511104232
Implantable Pulse Generator Implant Date: 20180301

## 2018-09-21 NOTE — Progress Notes (Signed)
Carelink Summary Report / Loop Recorder

## 2018-10-19 ENCOUNTER — Ambulatory Visit: Payer: BLUE CROSS/BLUE SHIELD | Admitting: *Deleted

## 2018-10-19 DIAGNOSIS — R002 Palpitations: Secondary | ICD-10-CM

## 2018-10-20 LAB — CUP PACEART REMOTE DEVICE CHECK
Date Time Interrogation Session: 20200613113812
Implantable Pulse Generator Implant Date: 20180301

## 2018-10-26 NOTE — Progress Notes (Signed)
Carelink Summary Report / Loop Recorder

## 2018-11-14 ENCOUNTER — Other Ambulatory Visit: Payer: Self-pay | Admitting: Internal Medicine

## 2018-11-19 ENCOUNTER — Ambulatory Visit (INDEPENDENT_AMBULATORY_CARE_PROVIDER_SITE_OTHER): Payer: BC Managed Care – PPO | Admitting: *Deleted

## 2018-11-19 DIAGNOSIS — R002 Palpitations: Secondary | ICD-10-CM | POA: Diagnosis not present

## 2018-11-19 LAB — CUP PACEART REMOTE DEVICE CHECK
Date Time Interrogation Session: 20200716154106
Implantable Pulse Generator Implant Date: 20180301

## 2018-11-26 NOTE — Progress Notes (Signed)
Carelink Summary Report / Loop Recorder

## 2018-12-18 ENCOUNTER — Other Ambulatory Visit: Payer: Self-pay | Admitting: Internal Medicine

## 2018-12-22 ENCOUNTER — Ambulatory Visit (INDEPENDENT_AMBULATORY_CARE_PROVIDER_SITE_OTHER): Payer: BC Managed Care – PPO | Admitting: *Deleted

## 2018-12-22 DIAGNOSIS — I480 Paroxysmal atrial fibrillation: Secondary | ICD-10-CM | POA: Diagnosis not present

## 2018-12-22 LAB — CUP PACEART REMOTE DEVICE CHECK
Date Time Interrogation Session: 20200818131532
Implantable Pulse Generator Implant Date: 20180301

## 2018-12-30 NOTE — Progress Notes (Signed)
Carelink Summary Report / Loop Recorder

## 2019-01-14 ENCOUNTER — Other Ambulatory Visit: Payer: Self-pay | Admitting: Internal Medicine

## 2019-01-24 LAB — CUP PACEART REMOTE DEVICE CHECK
Date Time Interrogation Session: 20200920153723
Implantable Pulse Generator Implant Date: 20180301

## 2019-01-25 ENCOUNTER — Ambulatory Visit (INDEPENDENT_AMBULATORY_CARE_PROVIDER_SITE_OTHER): Payer: BC Managed Care – PPO | Admitting: *Deleted

## 2019-01-25 DIAGNOSIS — R002 Palpitations: Secondary | ICD-10-CM | POA: Diagnosis not present

## 2019-02-01 NOTE — Progress Notes (Signed)
Carelink Summary Report / Loop Recorder

## 2019-02-26 ENCOUNTER — Ambulatory Visit (INDEPENDENT_AMBULATORY_CARE_PROVIDER_SITE_OTHER): Payer: BC Managed Care – PPO | Admitting: *Deleted

## 2019-02-26 DIAGNOSIS — R002 Palpitations: Secondary | ICD-10-CM | POA: Diagnosis not present

## 2019-02-26 DIAGNOSIS — I480 Paroxysmal atrial fibrillation: Secondary | ICD-10-CM

## 2019-02-27 LAB — CUP PACEART REMOTE DEVICE CHECK
Date Time Interrogation Session: 20201023154210
Implantable Pulse Generator Implant Date: 20180301

## 2019-03-05 NOTE — Progress Notes (Signed)
Carelink Summary Report / Loop Recorder

## 2019-04-02 LAB — CUP PACEART REMOTE DEVICE CHECK
Date Time Interrogation Session: 20201125104205
Implantable Pulse Generator Implant Date: 20180301

## 2019-04-05 ENCOUNTER — Ambulatory Visit (INDEPENDENT_AMBULATORY_CARE_PROVIDER_SITE_OTHER): Payer: BC Managed Care – PPO | Admitting: *Deleted

## 2019-04-05 DIAGNOSIS — R002 Palpitations: Secondary | ICD-10-CM

## 2019-04-28 NOTE — Progress Notes (Signed)
ILR remote 

## 2019-05-03 ENCOUNTER — Ambulatory Visit (INDEPENDENT_AMBULATORY_CARE_PROVIDER_SITE_OTHER): Payer: BC Managed Care – PPO | Admitting: *Deleted

## 2019-05-03 DIAGNOSIS — R002 Palpitations: Secondary | ICD-10-CM

## 2019-05-03 LAB — CUP PACEART REMOTE DEVICE CHECK
Date Time Interrogation Session: 20201228112756
Implantable Pulse Generator Implant Date: 20180301

## 2019-06-04 ENCOUNTER — Ambulatory Visit (INDEPENDENT_AMBULATORY_CARE_PROVIDER_SITE_OTHER): Payer: BC Managed Care – PPO | Admitting: *Deleted

## 2019-06-04 DIAGNOSIS — R002 Palpitations: Secondary | ICD-10-CM | POA: Diagnosis not present

## 2019-06-04 LAB — CUP PACEART REMOTE DEVICE CHECK
Date Time Interrogation Session: 20210128231443
Implantable Pulse Generator Implant Date: 20180301

## 2019-06-04 NOTE — Progress Notes (Signed)
ILR Remote

## 2019-06-27 ENCOUNTER — Ambulatory Visit: Payer: BC Managed Care – PPO | Attending: Internal Medicine

## 2019-06-27 DIAGNOSIS — Z23 Encounter for immunization: Secondary | ICD-10-CM | POA: Insufficient documentation

## 2019-06-27 NOTE — Progress Notes (Signed)
   Covid-19 Vaccination Clinic  Name:  Mathew [name]    MRN: [id number] DOB: 1951-01-27  06/27/2019  [name] was observed post Covid-19 immunization for 15 minutes without incidence. He was provided with Vaccine Information Sheet and instruction to access the V-Safe system.   [name] was instructed to call 911 with any severe reactions post vaccine: Marland Kitchen Difficulty breathing  . Swelling of your face and throat  . A fast heartbeat  . A bad rash all over your body  . Dizziness and weakness    Immunizations Administered    Name Date Dose VIS Date Route   Pfizer COVID-19 Vaccine 06/27/2019  3:17 PM 0.3 mL 04/16/2019 Intramuscular   Manufacturer: [place]   Lot: [id number]   [place]: [id number]

## 2019-07-05 ENCOUNTER — Ambulatory Visit (INDEPENDENT_AMBULATORY_CARE_PROVIDER_SITE_OTHER): Payer: BC Managed Care – PPO | Admitting: *Deleted

## 2019-07-05 DIAGNOSIS — R002 Palpitations: Secondary | ICD-10-CM | POA: Diagnosis not present

## 2019-07-05 LAB — CUP PACEART REMOTE DEVICE CHECK
Date Time Interrogation Session: 20210301004517
Implantable Pulse Generator Implant Date: 20180301

## 2019-07-05 NOTE — Progress Notes (Signed)
ILR Remote 

## 2019-07-27 ENCOUNTER — Ambulatory Visit: Payer: BC Managed Care – PPO | Attending: Internal Medicine

## 2019-07-27 DIAGNOSIS — Z23 Encounter for immunization: Secondary | ICD-10-CM

## 2019-07-27 NOTE — Progress Notes (Signed)
Covid-19 Vaccination Clinic  Name:  Mathew [name]    MRN: [id number] DOB: 12-25-1950  07/27/2019  [name] was observed post Covid-19 immunization for 15 minutes without incident. He was provided with Vaccine Information Sheet and instruction to access the V-Safe system.   [name] was instructed to call 911 with any severe reactions post vaccine: Marland Kitchen Difficulty breathing  . Swelling of face and throat  . A fast heartbeat  . A bad rash all over body  . Dizziness and weakness   Immunizations Administered    Name Date Dose VIS Date Route   Pfizer COVID-19 Vaccine 07/27/2019  3:25 PM 0.3 mL 04/16/2019 Intramuscular   Manufacturer: [place]   Lot: [id number]   [place]: [id number]

## 2019-08-05 ENCOUNTER — Ambulatory Visit (INDEPENDENT_AMBULATORY_CARE_PROVIDER_SITE_OTHER): Payer: BC Managed Care – PPO | Admitting: *Deleted

## 2019-08-05 DIAGNOSIS — R002 Palpitations: Secondary | ICD-10-CM

## 2019-08-05 LAB — CUP PACEART REMOTE DEVICE CHECK
Date Time Interrogation Session: 20210401014749
Implantable Pulse Generator Implant Date: 20180301

## 2019-08-05 NOTE — Progress Notes (Signed)
ILR Remote 

## 2019-09-05 LAB — CUP PACEART REMOTE DEVICE CHECK
Date Time Interrogation Session: 20210502015748
Implantable Pulse Generator Implant Date: 20180301

## 2019-09-06 ENCOUNTER — Ambulatory Visit (INDEPENDENT_AMBULATORY_CARE_PROVIDER_SITE_OTHER): Payer: BC Managed Care – PPO | Admitting: *Deleted

## 2019-09-06 DIAGNOSIS — R002 Palpitations: Secondary | ICD-10-CM | POA: Diagnosis not present

## 2019-09-06 NOTE — Progress Notes (Signed)
Carelink Summary Report / Loop Recorder

## 2019-10-07 ENCOUNTER — Ambulatory Visit (INDEPENDENT_AMBULATORY_CARE_PROVIDER_SITE_OTHER): Payer: BC Managed Care – PPO | Admitting: *Deleted

## 2019-10-07 DIAGNOSIS — R002 Palpitations: Secondary | ICD-10-CM

## 2019-10-07 LAB — CUP PACEART REMOTE DEVICE CHECK
Date Time Interrogation Session: 20210602015857
Implantable Pulse Generator Implant Date: 20180301

## 2019-10-12 NOTE — Progress Notes (Signed)
Carelink Summary Report / Loop Recorder

## 2019-11-09 ENCOUNTER — Ambulatory Visit (INDEPENDENT_AMBULATORY_CARE_PROVIDER_SITE_OTHER): Payer: BC Managed Care – PPO | Admitting: *Deleted

## 2019-11-09 DIAGNOSIS — R002 Palpitations: Secondary | ICD-10-CM | POA: Diagnosis not present

## 2019-11-09 LAB — CUP PACEART REMOTE DEVICE CHECK
Date Time Interrogation Session: 20210706013716
Implantable Pulse Generator Implant Date: 20180301

## 2019-11-10 NOTE — Progress Notes (Signed)
Carelink Summary Report / Loop Recorder

## 2019-12-11 LAB — CUP PACEART REMOTE DEVICE CHECK
Date Time Interrogation Session: 20210806013508
Implantable Pulse Generator Implant Date: 20180301

## 2019-12-13 ENCOUNTER — Ambulatory Visit (INDEPENDENT_AMBULATORY_CARE_PROVIDER_SITE_OTHER): Payer: BC Managed Care – PPO | Admitting: *Deleted

## 2019-12-13 DIAGNOSIS — I480 Paroxysmal atrial fibrillation: Secondary | ICD-10-CM | POA: Diagnosis not present

## 2019-12-13 NOTE — Progress Notes (Signed)
Carelink Summary Report / Loop Recorder 

## 2020-01-12 LAB — CUP PACEART REMOTE DEVICE CHECK
Date Time Interrogation Session: 20210906013710
Implantable Pulse Generator Implant Date: 20180301

## 2020-01-17 ENCOUNTER — Ambulatory Visit (INDEPENDENT_AMBULATORY_CARE_PROVIDER_SITE_OTHER): Payer: BC Managed Care – PPO | Admitting: *Deleted

## 2020-01-17 DIAGNOSIS — I480 Paroxysmal atrial fibrillation: Secondary | ICD-10-CM | POA: Diagnosis not present

## 2020-01-19 NOTE — Progress Notes (Signed)
Carelink Summary Report / Loop Recorder

## 2020-01-28 ENCOUNTER — Telehealth: Payer: Self-pay

## 2020-01-28 NOTE — Telephone Encounter (Signed)
  Carelink alert received patients ILR reached RRT 01/27/20.   Called patient to discuss options of explant or leave in. Patient would like to have device explanted. Advised patient I would send the scheduler a message to call to set up apt.   Discussed with patient remote monitoring/return kit/address verified.

## 2020-02-07 ENCOUNTER — Encounter: Payer: Self-pay | Admitting: Internal Medicine

## 2020-02-23 ENCOUNTER — Other Ambulatory Visit: Payer: Self-pay | Admitting: Physician Assistant

## 2020-02-23 DIAGNOSIS — Z136 Encounter for screening for cardiovascular disorders: Secondary | ICD-10-CM

## 2020-02-23 DIAGNOSIS — Z122 Encounter for screening for malignant neoplasm of respiratory organs: Secondary | ICD-10-CM

## 2020-03-08 ENCOUNTER — Ambulatory Visit
Admission: RE | Admit: 2020-03-08 | Discharge: 2020-03-08 | Disposition: A | Discharge status: Home / Self Care | Payer: BC Managed Care – PPO | Source: Ambulatory Visit | Attending: Physician Assistant | Admitting: Physician Assistant

## 2020-03-08 ENCOUNTER — Ambulatory Visit: Payer: BC Managed Care – PPO

## 2020-03-08 ENCOUNTER — Other Ambulatory Visit: Payer: Self-pay

## 2020-03-08 DIAGNOSIS — J439 Emphysema, unspecified: Secondary | ICD-10-CM | POA: Diagnosis not present

## 2020-03-08 DIAGNOSIS — Z136 Encounter for screening for cardiovascular disorders: Secondary | ICD-10-CM

## 2020-03-08 DIAGNOSIS — I700 Atherosclerosis of aorta: Secondary | ICD-10-CM | POA: Diagnosis not present

## 2020-03-08 DIAGNOSIS — Z122 Encounter for screening for malignant neoplasm of respiratory organs: Secondary | ICD-10-CM

## 2020-03-08 DIAGNOSIS — F17210 Nicotine dependence, cigarettes, uncomplicated: Secondary | ICD-10-CM | POA: Diagnosis not present

## 2020-03-08 DIAGNOSIS — R911 Solitary pulmonary nodule: Secondary | ICD-10-CM | POA: Diagnosis not present

## 2020-03-22 DIAGNOSIS — L821 Other seborrheic keratosis: Secondary | ICD-10-CM | POA: Diagnosis not present

## 2020-06-21 DIAGNOSIS — I10 Essential (primary) hypertension: Secondary | ICD-10-CM | POA: Diagnosis not present

## 2020-06-21 DIAGNOSIS — Z1211 Encounter for screening for malignant neoplasm of colon: Secondary | ICD-10-CM | POA: Diagnosis not present

## 2020-06-21 DIAGNOSIS — I4891 Unspecified atrial fibrillation: Secondary | ICD-10-CM | POA: Diagnosis not present

## 2020-07-31 DIAGNOSIS — R809 Proteinuria, unspecified: Secondary | ICD-10-CM | POA: Diagnosis not present

## 2020-07-31 DIAGNOSIS — I10 Essential (primary) hypertension: Secondary | ICD-10-CM | POA: Diagnosis not present

## 2020-07-31 DIAGNOSIS — F419 Anxiety disorder, unspecified: Secondary | ICD-10-CM | POA: Diagnosis not present

## 2020-07-31 DIAGNOSIS — I480 Paroxysmal atrial fibrillation: Secondary | ICD-10-CM | POA: Diagnosis not present

## 2020-08-10 DIAGNOSIS — Z23 Encounter for immunization: Secondary | ICD-10-CM | POA: Diagnosis not present

## 2020-08-24 ENCOUNTER — Telehealth: Payer: Self-pay | Admitting: *Deleted

## 2020-08-24 NOTE — Telephone Encounter (Signed)
S/w Caryl Pina, surgery scheduler for Dr. Benson Norway. Advised we only see the pt for his PPM. Informed primary cardiologist is Dr. Montez Morita. Ashle, states she will send clearance request to Dr. Thayer Ohm office and thanked me for the help.

## 2020-08-31 DIAGNOSIS — K5730 Diverticulosis of large intestine without perforation or abscess without bleeding: Secondary | ICD-10-CM | POA: Diagnosis not present

## 2020-08-31 DIAGNOSIS — Z1211 Encounter for screening for malignant neoplasm of colon: Secondary | ICD-10-CM | POA: Diagnosis not present

## 2020-08-31 DIAGNOSIS — D123 Benign neoplasm of transverse colon: Secondary | ICD-10-CM | POA: Diagnosis not present

## 2020-08-31 DIAGNOSIS — K635 Polyp of colon: Secondary | ICD-10-CM | POA: Diagnosis not present

## 2020-09-14 DIAGNOSIS — M5441 Lumbago with sciatica, right side: Secondary | ICD-10-CM | POA: Diagnosis not present

## 2020-09-16 ENCOUNTER — Emergency Department (HOSPITAL_COMMUNITY)
Admission: EM | Admit: 2020-09-16 | Discharge: 2020-09-16 | Disposition: A | Discharge status: Home / Self Care | Payer: BC Managed Care – PPO | Attending: Emergency Medicine | Admitting: Emergency Medicine

## 2020-09-16 ENCOUNTER — Other Ambulatory Visit: Payer: Self-pay

## 2020-09-16 ENCOUNTER — Emergency Department (HOSPITAL_COMMUNITY): Payer: BC Managed Care – PPO

## 2020-09-16 DIAGNOSIS — Z8546 Personal history of malignant neoplasm of prostate: Secondary | ICD-10-CM | POA: Insufficient documentation

## 2020-09-16 DIAGNOSIS — Z79899 Other long term (current) drug therapy: Secondary | ICD-10-CM | POA: Insufficient documentation

## 2020-09-16 DIAGNOSIS — M5431 Sciatica, right side: Secondary | ICD-10-CM | POA: Insufficient documentation

## 2020-09-16 DIAGNOSIS — I10 Essential (primary) hypertension: Secondary | ICD-10-CM | POA: Diagnosis not present

## 2020-09-16 DIAGNOSIS — M5441 Lumbago with sciatica, right side: Secondary | ICD-10-CM | POA: Diagnosis not present

## 2020-09-16 DIAGNOSIS — F17210 Nicotine dependence, cigarettes, uncomplicated: Secondary | ICD-10-CM | POA: Diagnosis not present

## 2020-09-16 DIAGNOSIS — M545 Low back pain, unspecified: Secondary | ICD-10-CM | POA: Diagnosis not present

## 2020-09-16 DIAGNOSIS — M549 Dorsalgia, unspecified: Secondary | ICD-10-CM | POA: Diagnosis not present

## 2020-09-16 MED ORDER — METHYLPREDNISOLONE 4 MG PO TBPK: ORAL_TABLET | ORAL | 0 refills | Status: DC

## 2020-09-16 MED ORDER — HYDROCODONE-ACETAMINOPHEN 5-325 MG PO TABS
1.0000 | ORAL_TABLET | Freq: Once | ORAL | Status: AC
  Administered 2020-09-16: 1 via ORAL
  Filled 2020-09-16: qty 1

## 2020-09-16 NOTE — ED Provider Notes (Signed)
[facility] DEPT Provider Note   CSN: [id number] Arrival date & time: 09/16/20  1432     History Chief Complaint  Patient presents with  . Leg Pain    Mathew [name] is a 70 y.o. male.  HPI 71 year old male with a history of hep C, hypertension, OSA, prostate cancer presents to the ER with complaints of right-sided back pain radiating into his leg.  Patient reports several days of back pain with radiation into his hip, seen at his PCPs office and given steroid shot.  Patient is also been taking ibuprofen for pain.  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  He denies any leg swelling, chest pain, shortness of breath.  He does endorse the pain radiating to his popliteal area, but has not noticed any swelling.  He denies any loss of bowel bladder control, no foot drop, no night sweats, no unintended weight loss    Past Medical History:  Diagnosis Date  . Hepatitis C    s/p treatment  . Hypertension   . Obstructive sleep apnea    unable to tolerate CPAP  . Premature ventricular contraction   . Prostate cancer ([place]) 2006    There are no problems to display for this patient.   Past Surgical History:  Procedure Laterality Date  . LOOP RECORDER INSERTION N/A 07/04/2016   Procedure: Loop Recorder Insertion;  Surgeon: Thompson Grayer, MD;  Location: [facility];  Service: Cardiovascular;  Laterality: N/A;  . prostectomy    . SPLENECTOMY, TOTAL         Family History  Problem Relation Age of Onset  . Heart attack Neg Hx     Social History   Tobacco Use  . Smoking status: Current Every Day Smoker    Packs/day: 0.50    Types: Cigarettes  . Smokeless tobacco: Never Used  Vaping Use  . Vaping Use: Never used  Substance Use Topics  . Alcohol use: Yes    Comment: weekends  . Drug use: No    Home Medications Prior to Admission medications   Medication Sig Start Date End Date Taking? Authorizing  Provider  methylPREDNISolone (MEDROL DOSEPAK) 4 MG TBPK tablet Take pack as directed until finished 09/16/20  Yes Evelynn Hench A, PA-C  amLODipine (NORVASC) 5 MG tablet Take 1 tablet (5 mg total) by mouth daily. Please make overdue appt with Dr. Rayann Heman before anymore refills. 3rd and Final Attempt 01/15/19   Thompson Grayer, MD  losartan (COZAAR) 100 MG tablet Take 1 tablet (100 mg total) by mouth daily. Please make overdue appt with Dr. Rayann Heman before anymore refills. 3rd and Final Attempt 01/15/19   Thompson Grayer, MD  metoprolol succinate (TOPROL-XL) 100 MG 24 hr tablet Take 1 tablet (100 mg total) by mouth daily. NEED TO SCHEDULE APPOINTMENT 11/17/18   Allred, Jeneen Rinks, MD  Multiple Vitamin (MULTIVITAMIN WITH MINERALS) TABS tablet Take 1 tablet by mouth daily.    [provider]  valACYclovir (VALTREX) 500 MG tablet Take 1 tablet by mouth daily as needed. For cold sore flare ups 03/09/16   [provider]    Allergies    Patient has no known allergies.  Review of Systems   Review of Systems  Musculoskeletal: Positive for back pain.  Neurological: Negative for weakness and numbness.    Physical Exam Updated Vital Signs BP (!) 150/96   Pulse 82   Temp 97.9 F (36.6 C) (Oral)  Resp 17   Ht _0  (1.905 m)   Wt 93.9 kg   SpO2 97%   BMI 25.87 kg/m   Physical Exam Vitals and nursing note reviewed.  Constitutional:      Appearance: He is well-developed.  HENT:     Head: Normocephalic and atraumatic.  Eyes:     Conjunctiva/sclera: Conjunctivae normal.  Cardiovascular:     Rate and Rhythm: Normal rate and regular rhythm.     Heart sounds: No murmur heard.   Pulmonary:     Effort: Pulmonary effort is normal. No respiratory distress.     Breath sounds: Normal breath sounds.  Abdominal:     Palpations: Abdomen is soft.     Tenderness: There is no abdominal tenderness.  Musculoskeletal:        General: Tenderness present. No swelling, deformity or signs of injury.      Cervical back: Neck supple.     Right lower leg: No edema.     Left lower leg: No edema.     Comments: Mild paraspinal muscle tenderness bilaterally to the lumbar spine.  No midline tenderness to the L-spine.  5/5 strength with flexion and extension of the bilateral lower extremities.  Neurovascularly intact, 2+ DP pulses.  No visible erythema, swelling, warmth to the calf, popliteal area  Skin:    General: Skin is warm and dry.     Findings: No erythema or rash.  Neurological:     General: No focal deficit present.     Mental Status: He is alert and oriented to person, place, and time.     Sensory: No sensory deficit.     Motor: No weakness.  Psychiatric:        Mood and Affect: Mood normal.        Behavior: Behavior normal.     ED Results / Procedures / Treatments   Labs (all labs ordered are listed, but only abnormal results are displayed) Labs Reviewed - No data to display  EKG None  Radiology No results found.  Procedures Procedures   Medications Ordered in ED Medications  HYDROcodone-acetaminophen (NORCO/VICODIN) 5-325 MG per tablet 1 tablet (1 tablet Oral Given 09/16/20 1703)    ED Course  I have reviewed the triage vital signs and the nursing notes.  Pertinent labs & imaging results that were available during my care of the patient were reviewed by me and considered in my medical decision making (see chart for details).    MDM Rules/Calculators/A&P                          70 year old male presents to the ER with sciatica symptoms.  He has no red flag signs to suggest cauda equina, no visible swelling in the right calf, popliteal area to suggest a DVT.  Full range of motion of hip, no evidence of septic joint.  Low suspicion for dissection, renal stones.  He is neurovascularly intact.  Plain films without any evidence of cancer, no fractures.  Patient given Norco here for pain, will send home with longer course of steroids.  Neurosurgery referral provided as  well.  Encouraged Tylenol and ibuprofen for pain.  Return precautions discussed.  Stable for discharge Final Clinical Impression(s) / ED Diagnoses Final diagnoses:  Sciatica of right side    Rx / DC Orders ED Discharge Orders         Ordered    methylPREDNISolone (MEDROL DOSEPAK) 4 MG TBPK tablet  09/16/20 [place], PA-C 09/16/20 1811    Lacretia Leigh, MD 09/17/20 1358

## 2020-09-16 NOTE — Discharge Instructions (Addendum)
Please make sure to take the steroid as directed until finished.  You may also take Tylenol or ibuprofen for pain.  Please follow-up with the spine doctors whose contact information I have provided in your discharge paperwork.  Please return to the ER for any new or worsening symptoms.

## 2020-09-16 NOTE — ED Triage Notes (Signed)
Pt states that he has had ongoing lower back pain and hip pain on R side but the pain has started in the R leg as well. Received a cortisone shot 3 days ago w/o relief. Alert and oriented.

## 2020-09-21 DIAGNOSIS — Z6825 Body mass index (BMI) 25.0-25.9, adult: Secondary | ICD-10-CM | POA: Diagnosis not present

## 2020-09-21 DIAGNOSIS — I10 Essential (primary) hypertension: Secondary | ICD-10-CM | POA: Diagnosis not present

## 2020-09-21 DIAGNOSIS — M5416 Radiculopathy, lumbar region: Secondary | ICD-10-CM | POA: Diagnosis not present

## 2020-10-04 DIAGNOSIS — M5416 Radiculopathy, lumbar region: Secondary | ICD-10-CM | POA: Diagnosis not present

## 2020-10-04 DIAGNOSIS — M545 Low back pain, unspecified: Secondary | ICD-10-CM | POA: Diagnosis not present

## 2020-10-12 DIAGNOSIS — Z6826 Body mass index (BMI) 26.0-26.9, adult: Secondary | ICD-10-CM | POA: Diagnosis not present

## 2020-10-12 DIAGNOSIS — M5126 Other intervertebral disc displacement, lumbar region: Secondary | ICD-10-CM | POA: Diagnosis not present

## 2020-10-12 DIAGNOSIS — I10 Essential (primary) hypertension: Secondary | ICD-10-CM | POA: Diagnosis not present

## 2020-11-13 DIAGNOSIS — M5416 Radiculopathy, lumbar region: Secondary | ICD-10-CM | POA: Diagnosis not present

## 2021-01-03 DIAGNOSIS — C61 Malignant neoplasm of prostate: Secondary | ICD-10-CM | POA: Diagnosis not present

## 2021-01-10 DIAGNOSIS — R32 Unspecified urinary incontinence: Secondary | ICD-10-CM | POA: Diagnosis not present

## 2021-01-10 DIAGNOSIS — N5231 Erectile dysfunction following radical prostatectomy: Secondary | ICD-10-CM | POA: Diagnosis not present

## 2021-01-10 DIAGNOSIS — C61 Malignant neoplasm of prostate: Secondary | ICD-10-CM | POA: Diagnosis not present

## 2021-01-18 DIAGNOSIS — I10 Essential (primary) hypertension: Secondary | ICD-10-CM | POA: Diagnosis not present

## 2021-01-18 DIAGNOSIS — I480 Paroxysmal atrial fibrillation: Secondary | ICD-10-CM | POA: Diagnosis not present

## 2021-01-18 DIAGNOSIS — F419 Anxiety disorder, unspecified: Secondary | ICD-10-CM | POA: Diagnosis not present

## 2021-01-18 DIAGNOSIS — B009 Herpesviral infection, unspecified: Secondary | ICD-10-CM | POA: Diagnosis not present

## 2021-02-22 DIAGNOSIS — I480 Paroxysmal atrial fibrillation: Secondary | ICD-10-CM | POA: Diagnosis not present

## 2021-02-22 DIAGNOSIS — I10 Essential (primary) hypertension: Secondary | ICD-10-CM | POA: Diagnosis not present

## 2021-02-22 DIAGNOSIS — Z8546 Personal history of malignant neoplasm of prostate: Secondary | ICD-10-CM | POA: Diagnosis not present

## 2021-03-02 DIAGNOSIS — R911 Solitary pulmonary nodule: Secondary | ICD-10-CM | POA: Diagnosis not present

## 2021-03-02 DIAGNOSIS — Z0000 Encounter for general adult medical examination without abnormal findings: Secondary | ICD-10-CM | POA: Diagnosis not present

## 2021-03-02 DIAGNOSIS — Z23 Encounter for immunization: Secondary | ICD-10-CM | POA: Diagnosis not present

## 2021-03-02 DIAGNOSIS — Q8901 Asplenia (congenital): Secondary | ICD-10-CM | POA: Diagnosis not present

## 2021-03-02 DIAGNOSIS — Z8546 Personal history of malignant neoplasm of prostate: Secondary | ICD-10-CM | POA: Diagnosis not present

## 2021-03-14 ENCOUNTER — Other Ambulatory Visit: Payer: Self-pay | Admitting: Physician Assistant

## 2021-03-14 DIAGNOSIS — Z122 Encounter for screening for malignant neoplasm of respiratory organs: Secondary | ICD-10-CM

## 2021-03-14 DIAGNOSIS — R911 Solitary pulmonary nodule: Secondary | ICD-10-CM

## 2021-03-15 DIAGNOSIS — R059 Cough, unspecified: Secondary | ICD-10-CM | POA: Diagnosis not present

## 2021-03-15 DIAGNOSIS — R5383 Other fatigue: Secondary | ICD-10-CM | POA: Diagnosis not present

## 2021-03-15 DIAGNOSIS — Z20822 Contact with and (suspected) exposure to covid-19: Secondary | ICD-10-CM | POA: Diagnosis not present

## 2021-03-15 DIAGNOSIS — R509 Fever, unspecified: Secondary | ICD-10-CM | POA: Diagnosis not present

## 2021-03-15 DIAGNOSIS — J0100 Acute maxillary sinusitis, unspecified: Secondary | ICD-10-CM | POA: Diagnosis not present

## 2021-03-15 DIAGNOSIS — R0981 Nasal congestion: Secondary | ICD-10-CM | POA: Diagnosis not present

## 2021-03-15 DIAGNOSIS — Q8901 Asplenia (congenital): Secondary | ICD-10-CM | POA: Diagnosis not present

## 2021-04-05 ENCOUNTER — Ambulatory Visit
Admission: RE | Admit: 2021-04-05 | Discharge: 2021-04-05 | Disposition: A | Discharge status: Home / Self Care | Payer: BC Managed Care – PPO | Source: Ambulatory Visit | Attending: Physician Assistant | Admitting: Physician Assistant

## 2021-04-05 DIAGNOSIS — Z122 Encounter for screening for malignant neoplasm of respiratory organs: Secondary | ICD-10-CM

## 2021-04-05 DIAGNOSIS — I2510 Atherosclerotic heart disease of native coronary artery without angina pectoris: Secondary | ICD-10-CM | POA: Diagnosis not present

## 2021-04-05 DIAGNOSIS — R911 Solitary pulmonary nodule: Secondary | ICD-10-CM

## 2021-04-05 DIAGNOSIS — J432 Centrilobular emphysema: Secondary | ICD-10-CM | POA: Diagnosis not present

## 2021-04-05 DIAGNOSIS — Z87891 Personal history of nicotine dependence: Secondary | ICD-10-CM | POA: Diagnosis not present

## 2021-04-05 DIAGNOSIS — J849 Interstitial pulmonary disease, unspecified: Secondary | ICD-10-CM | POA: Diagnosis not present

## 2021-05-02 DIAGNOSIS — Q8901 Asplenia (congenital): Secondary | ICD-10-CM | POA: Diagnosis not present

## 2021-05-02 DIAGNOSIS — Z23 Encounter for immunization: Secondary | ICD-10-CM | POA: Diagnosis not present

## 2021-05-02 DIAGNOSIS — R918 Other nonspecific abnormal finding of lung field: Secondary | ICD-10-CM | POA: Diagnosis not present

## 2021-05-02 DIAGNOSIS — E7800 Pure hypercholesterolemia, unspecified: Secondary | ICD-10-CM | POA: Diagnosis not present

## 2021-05-10 DIAGNOSIS — N3000 Acute cystitis without hematuria: Secondary | ICD-10-CM | POA: Diagnosis not present

## 2021-05-10 DIAGNOSIS — N390 Urinary tract infection, site not specified: Secondary | ICD-10-CM | POA: Diagnosis not present

## 2021-05-10 DIAGNOSIS — B961 Klebsiella pneumoniae [K. pneumoniae] as the cause of diseases classified elsewhere: Secondary | ICD-10-CM | POA: Diagnosis not present

## 2021-05-31 ENCOUNTER — Encounter: Payer: Self-pay | Admitting: Internal Medicine

## 2021-05-31 ENCOUNTER — Other Ambulatory Visit: Payer: Self-pay

## 2021-05-31 ENCOUNTER — Ambulatory Visit: Payer: BC Managed Care – PPO | Admitting: Internal Medicine

## 2021-05-31 VITALS — BP 120/80 | HR 71 | Temp 97.9°F | Ht 75.0 in | Wt 219.8 lb

## 2021-05-31 DIAGNOSIS — J849 Interstitial pulmonary disease, unspecified: Secondary | ICD-10-CM | POA: Diagnosis not present

## 2021-05-31 NOTE — Addendum Note (Signed)
Addended by: Vanessa Barbara on: 05/31/2021 04:20 PM   Modules accepted: Orders

## 2021-05-31 NOTE — Patient Instructions (Signed)
ICD-10-CM   1. ILD (interstitial lung disease) ([place])  J84.9       - I am concerned you  have Interstitial Lung Disease (ILD) aka Pulmonary Fibrosis (PF)  -  There are MANY varieties of this - To narrow down possibilities and assess severity please do the following tests  - do  full PFT  - do walking test on room air in the office (not 6 min walk test) today or next visit  -  do High Resolution CT chest wo contrast - supine and prone, inspiratory and expiratory images (only Dr Rosario Jacks or Dr Weber Cooks or Dr Polly Cobia or Dr Laqueta Carina or Dr Burt Ek to read)  - do autoimmune panel: Serum: ESR, ANA, DS-DNA, RF, anti-CCP, ssA, ssB, scl-70, ANCA, MPO and PR-3 antibodies, Total CK,  Aldolase,  Hypersensitivity Pneumonitis Panel, Quantiferon Gold   Followup  -= 30 min visit with Dr Chase Caller or APP to review result and discuss next steps  - retrn in <= 4-5 weeks
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
## 2021-06-01 LAB — SEDIMENTATION RATE: Sed Rate: 27 mm/hr — ABNORMAL HIGH (ref 0–20)

## 2021-06-01 LAB — MPO/PR-3 (ANCA) ANTIBODIES
Myeloperoxidase Abs: <1 AI
Serine Protease 3: <1 AI

## 2021-06-04 LAB — RHEUMATOID FACTOR: Rheumatoid fact SerPl-aCnc: <14 IU/mL (ref <14)

## 2021-06-04 LAB — ANTI-NUCLEAR AB-TITER (ANA TITER): ANA Titer 1: 1:80 {titer} — ABNORMAL HIGH

## 2021-06-04 LAB — SJOGREN'S SYNDROME ANTIBODS(SSA + SSB)
SSA (Ro) (ENA) Antibody, IgG: <1 AI
SSB (La) (ENA) Antibody, IgG: <1 AI

## 2021-06-04 LAB — ANCA SCREEN W REFLEX TITER: ANCA Screen: NEGATIVE

## 2021-06-04 LAB — ANA: Anti Nuclear Antibody (ANA): POSITIVE — AB

## 2021-06-04 LAB — CYCLIC CITRUL PEPTIDE ANTIBODY, IGG: Cyclic Citrullin Peptide Ab: <16 UNITS

## 2021-06-04 LAB — QUANTIFERON-TB GOLD PLUS
Mitogen-NIL: >10 IU/mL
NIL: 0.11 IU/mL
QuantiFERON-TB Gold Plus: NEGATIVE
TB1-NIL: 0.02 IU/mL
TB2-NIL: 0.02 IU/mL

## 2021-06-04 LAB — CK TOTAL AND CKMB (NOT AT ARMC): Total CK: 165 U/L (ref 44–196)

## 2021-06-04 LAB — ALDOLASE: Aldolase: 4.8 U/L (ref <=8.1)

## 2021-06-05 LAB — ANCA PROFILE
Anti-MPO Antibodies: <0.2 units (ref 0.0–0.9)
Anti-PR3 Antibodies: <0.2 units (ref 0.0–0.9)
Atypical pANCA: <1:20 {titer}
C-ANCA: <1:20 {titer}
P-ANCA: <1:20 {titer}

## 2021-06-05 LAB — ANTI-CCP AB, IGG + IGA (RDL): Anti-CCP Ab, IgG + IgA (RDL): <20 Units (ref <20)

## 2021-06-07 LAB — ANA+ENA+DNA/DS+SCL 70+SJOSSA/B
ANA Titer 1: NEGATIVE
ENA RNP Ab: <0.2 AI (ref 0.0–0.9)
ENA SM Ab Ser-aCnc: <0.2 AI (ref 0.0–0.9)
ENA SSA (RO) Ab: <0.2 AI (ref 0.0–0.9)
ENA SSB (LA) Ab: <0.2 AI (ref 0.0–0.9)
Scleroderma (Scl-70) (ENA) Antibody, IgG: <0.2 AI (ref 0.0–0.9)
dsDNA Ab: 2 IU/mL (ref 0–9)

## 2021-06-07 LAB — HYPERSENSITIVITY PNEUMONITIS
A. Pullulans Abs: NEGATIVE
A.Fumigatus #1 Abs: NEGATIVE
Micropolyspora faeni, IgG: NEGATIVE
Pigeon Serum Abs: NEGATIVE
Thermoact. Saccharii: NEGATIVE
Thermoactinomyces vulgaris, IgG: NEGATIVE

## 2021-06-22 ENCOUNTER — Ambulatory Visit
Admission: RE | Admit: 2021-06-22 | Discharge: 2021-06-22 | Disposition: A | Discharge status: Home / Self Care | Payer: BC Managed Care – PPO | Source: Ambulatory Visit | Attending: Internal Medicine | Admitting: Internal Medicine

## 2021-06-22 DIAGNOSIS — J84112 Idiopathic pulmonary fibrosis: Secondary | ICD-10-CM | POA: Diagnosis not present

## 2021-06-22 DIAGNOSIS — J849 Interstitial pulmonary disease, unspecified: Secondary | ICD-10-CM

## 2021-06-22 DIAGNOSIS — J479 Bronchiectasis, uncomplicated: Secondary | ICD-10-CM | POA: Diagnosis not present

## 2021-06-22 DIAGNOSIS — J432 Centrilobular emphysema: Secondary | ICD-10-CM | POA: Diagnosis not present

## 2021-06-22 DIAGNOSIS — J929 Pleural plaque without asbestos: Secondary | ICD-10-CM | POA: Diagnosis not present

## 2021-06-26 DIAGNOSIS — N3000 Acute cystitis without hematuria: Secondary | ICD-10-CM | POA: Diagnosis not present

## 2021-07-05 ENCOUNTER — Ambulatory Visit (INDEPENDENT_AMBULATORY_CARE_PROVIDER_SITE_OTHER): Payer: BC Managed Care – PPO | Admitting: Internal Medicine

## 2021-07-05 ENCOUNTER — Ambulatory Visit: Payer: BC Managed Care – PPO | Admitting: Internal Medicine

## 2021-07-05 ENCOUNTER — Other Ambulatory Visit: Payer: Self-pay

## 2021-07-05 ENCOUNTER — Encounter: Payer: Self-pay | Admitting: Internal Medicine

## 2021-07-05 VITALS — BP 114/74 | HR 71 | Temp 98.1°F | Ht 74.5 in | Wt 214.2 lb

## 2021-07-05 DIAGNOSIS — J849 Interstitial pulmonary disease, unspecified: Secondary | ICD-10-CM | POA: Diagnosis not present

## 2021-07-05 DIAGNOSIS — F17200 Nicotine dependence, unspecified, uncomplicated: Secondary | ICD-10-CM | POA: Diagnosis not present

## 2021-07-05 NOTE — Patient Instructions (Addendum)
ICD-10-CM   ?1. ILD (interstitial lung disease) ([place])  J84.9   ?  ?2. Smoking  F17.200   ?  ? ? ?You definitely have interstitial lung disease. ? 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 -I am giving you this diagnosis based on the fact your age greater than 53, male, smoker, negative serology blood test, and based on the probability on the CT scan reading ? ?Plan ? - I will discuss in case conference in the next 2 months to add more confidence to the diagnosis ? -If there is change in the confidence in diagnosis then we will discuss lung biopsy ?-Meanwhile best to start treatment.  We discussed various options ? -Recommend starting pirfenidone at 2 pills 3 times daily [we will hold off on doing the max dose of 3 pills 3 times daily) -and we will see how you handle this medication and side effects ? -Visit with pharmacist for counseling about the medication ? ?-Meanwhile also quit smoking ? -We took a shared decision making that you will quit smoking on your own willpower [you are not interested in Chantix or Zyban at this time] ? ?Follow-up ?- 6 weeks with nurse practitioner to ensure that you are doing okay with the medications ?-10-12 weeks do spirometry and DLCO ?-Return to see [name] Caller in a 30-minute visit-12 weeks to monitor progress ? ? ?  ?

## 2021-07-05 NOTE — Progress Notes (Signed)
PFT done today. 
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
## 2021-07-11 DIAGNOSIS — C61 Malignant neoplasm of prostate: Secondary | ICD-10-CM | POA: Diagnosis not present

## 2021-07-11 DIAGNOSIS — R319 Hematuria, unspecified: Secondary | ICD-10-CM | POA: Diagnosis not present

## 2021-07-12 ENCOUNTER — Telehealth: Payer: Self-pay

## 2021-07-12 DIAGNOSIS — J849 Interstitial pulmonary disease, unspecified: Secondary | ICD-10-CM

## 2021-07-12 NOTE — Telephone Encounter (Signed)
Received New start paperwork for ESBRIET. Will update as we work through the benefits process. ? ?Submitted a Prior Authorization request to [facility] for PIRFENIDONE via CoverMyMeds. Will update once we receive a response. ? ? ?Key: [id number] ? ? ?PAP application placed in PAP Pending folder. ?

## 2021-07-13 ENCOUNTER — Other Ambulatory Visit (HOSPITAL_COMMUNITY): Payer: Self-pay

## 2021-07-13 NOTE — Telephone Encounter (Signed)
Received notification from [facility] regarding a prior authorization for PIRFENIDONE. Authorization has been APPROVED from 07/12/2021 to 07/13/2022. Approval letter sent to scan center. ? ?Patient must fill through [facility]: [phone]  [phone] listed in test claim) ? ?Authorization # [id number] ? ? ?Pt has Pharmacist, community, will work on obtaining copay card. ?

## 2021-07-16 LAB — PULMONARY FUNCTION TEST
DL/VA % pred: 82 %
DL/VA: 3.28 ml/min/mmHg/L
DLCO cor % pred: 51 %
DLCO cor: 15.27 ml/min/mmHg
DLCO unc % pred: 51 %
DLCO unc: 15.27 ml/min/mmHg
FEF 25-75 Post: 1.56 L/sec
FEF 25-75 Pre: 2.16 L/sec
FEF2575-%Change-Post: -27 %
FEF2575-%Pred-Post: 52 %
FEF2575-%Pred-Pre: 72 %
FEV1-%Change-Post: -6 %
FEV1-%Pred-Post: 57 %
FEV1-%Pred-Pre: 61 %
FEV1-Post: 2 L
FEV1-Pre: 2.13 L
FEV1FVC-%Change-Post: 2 %
FEV1FVC-%Pred-Pre: 105 %
FEV6-%Change-Post: -8 %
FEV6-%Pred-Post: 55 %
FEV6-%Pred-Pre: 60 %
FEV6-Post: 2.42 L
FEV6-Pre: 2.64 L
FEV6FVC-%Pred-Post: 104 %
FEV6FVC-%Pred-Pre: 104 %
FVC-%Change-Post: -8 %
FVC-%Pred-Post: 53 %
FVC-%Pred-Pre: 57 %
FVC-Post: 2.42 L
Post FEV1/FVC ratio: 83 %
Post FEV6/FVC ratio: 100 %
Pre FEV1/FVC ratio: 81 %
Pre FEV6/FVC Ratio: 100 %
RV % pred: 76 %
RV: 2.04 L
TLC % pred: 62 %
TLC: 4.93 L

## 2021-07-19 NOTE — Telephone Encounter (Signed)
? ? ? ? ?

## 2021-07-19 NOTE — Telephone Encounter (Signed)
Routing to [facility] for next steps. Unused PAP application sent to scan center. ?

## 2021-07-27 MED ORDER — PIRFENIDONE 267 MG PO TABS: 534.0000 mg | ORAL_TABLET | Freq: Three times a day (TID) | ORAL | 5 refills | Status: DC

## 2021-07-27 MED ORDER — PIRFENIDONE 267 MG PO TABS: ORAL_TABLET | ORAL | 0 refills | Status: DC

## 2021-07-27 NOTE — Telephone Encounter (Signed)
Rx for pirfenidone sent to [facility] ?Month 1: 263m three times daily x 14 days then increase to 5315mthree times daily thereafter ?Month 2 onwards: 53435mhree times daily (low dose as maintenance) ? ?Will call [facility] Monday, 07/30/21 to provide copay card information ? ?Patient scheduled for phone visit with pharmacy team for pirfenidone counseling on Tuesday, 07/31/21 ? ?DevKnox SalivaharmD, MPH, BCPS ?Clinical Pharmacist (Rheumatology and Pulmonology)  ?

## 2021-07-31 ENCOUNTER — Telehealth: Payer: BC Managed Care – PPO | Admitting: Pharmacist

## 2021-08-02 ENCOUNTER — Other Ambulatory Visit: Payer: BC Managed Care – PPO | Admitting: Pharmacist

## 2021-08-02 NOTE — Progress Notes (Deleted)
? ?Subjective:  ?Patient presents today to [facility] to see pharmacy team for pirfenidone new start.   Patient was last seen and referred by *** on ***.  Pertinent past medical history includes ***. ? ?Prior therapy includes ***. ? ?History of elevated LFTs: No ?History of diarrhea, nausea, vomiting: No ? ?Objective: ?No Known Allergies ? ?Outpatient Encounter Medications as of 08/02/2021  ?Medication Sig  ? amLODipine (NORVASC) 5 MG tablet Take 1 tablet (5 mg total) by mouth daily. Please make overdue appt with Dr. Rayann Heman before anymore refills. 3rd and Final Attempt  ? escitalopram (LEXAPRO) 10 MG tablet Take 10 mg by mouth daily.  ? losartan (COZAAR) 100 MG tablet Take 1 tablet (100 mg total) by mouth daily. Please make overdue appt with Dr. Rayann Heman before anymore refills. 3rd and Final Attempt  ? metoprolol succinate (TOPROL-XL) 100 MG 24 hr tablet Take 1 tablet (100 mg total) by mouth daily. NEED TO SCHEDULE APPOINTMENT  ? Multiple Vitamin (MULTIVITAMIN WITH MINERALS) TABS tablet Take 1 tablet by mouth daily.  ? Pirfenidone 267 MG TABS Month 1: Take 1 tablet three times daily for 2 weeks then increase to 2 tablets three times daily thereafter. **STAY ON LOW DOSE**  ? Pirfenidone 267 MG TABS Take 2 tablets (534 mg total) by mouth 3 (three) times daily with meals. Month 2 onwards  ? pravastatin (PRAVACHOL) 20 MG tablet Take 20 mg by mouth daily.  ? valACYclovir (VALTREX) 500 MG tablet Take 1 tablet by mouth daily as needed. For cold sore flare ups  ? ?No facility-administered encounter medications on file as of 08/02/2021.  ?  ? ?Immunization History  ?Administered Date(s) Administered  ? HiB (PRP-OMP) 05/02/2021  ? Influenza, High Dose Seasonal PF 02/08/2019, 03/02/2020, 03/02/2021  ? PFIZER Comirnaty(Gray Top)Covid-19 Tri-Sucrose Vaccine 08/10/2020  ? PFIZER(Purple Top)SARS-COV-2 Vaccination 06/27/2019, 07/27/2019  ? Pension scheme manager 65yr & up 05/02/2021  ? Pneumococcal  Conjugate-13 02/08/2019  ? Pneumococcal Polysaccharide-23 03/02/2020  ? Tdap 03/02/2021  ?  ? ? ?PFT's ?TLC  ?Date Value Ref Range Status  ?07/06/2021 4.93 L Final  ?  ? ? ?CMP  ?   ?Component Value Date/Time  ? NA 133 (L) 06/12/2018 0815  ? K 3.5 06/12/2018 0815  ? CL 103 06/12/2018 0815  ? CO2 24 06/12/2018 0815  ? GLUCOSE 93 06/12/2018 0815  ? BUN 10 06/12/2018 0815  ? CREATININE 0.74 06/12/2018 0815  ? CALCIUM 9.1 06/12/2018 0815  ? PROT 7.9 06/12/2018 0815  ? ALBUMIN 4.4 06/12/2018 0815  ? AST 20 06/12/2018 0815  ? ALT 22 06/12/2018 0815  ? ALKPHOS 60 06/12/2018 0815  ? BILITOT 0.8 06/12/2018 0815  ? GFRNONAA >60 06/12/2018 0815  ? GFRAA >60 06/12/2018 0815  ?  ? ? ?CBC ?   ?Component Value Date/Time  ? WBC 13.3 (H) 06/12/2018 0815  ? RBC 5.10 06/12/2018 0815  ? HGB 15.0 06/12/2018 0815  ? HCT 46.7 06/12/2018 0815  ? PLT 205 06/12/2018 0815  ? MCV 91.6 06/12/2018 0815  ? MCH 29.4 06/12/2018 0815  ? MCHC 32.1 06/12/2018 0815  ? RDW 15.1 06/12/2018 0815  ? LYMPHSABS 4.5 (H) 06/12/2018 0815  ? MONOABS 1.2 (H) 06/12/2018 0815  ? EOSABS 0.6 (H) 06/12/2018 0815  ? BASOSABS 0.2 (H) 06/12/2018 0815  ?  ? ? ?LFT's ? ?  Latest Ref Rng & Units 06/12/2018  ?  8:15 AM 07/24/2016  ?  8:57 PM 06/06/2013  ? 10:00 PM  ?Hepatic Function  ?  Total Protein 6.5 - 8.1 g/dL 7.9   8.8   8.1    ?Albumin 3.5 - 5.0 g/dL 4.4   4.3   3.6    ?AST 15 - 41 U/L 20   32   72    ?ALT 0 - 44 U/L 22   31   114    ?Alk Phosphatase 38 - 126 U/L 60   62   82    ?Total Bilirubin 0.3 - 1.2 mg/dL 0.8   0.9   0.5    ?  ?HRCT (06/25/21) - Moderate pulmonary fibrosis in a pattern with apical to basal gradient, featuring irregular peripheral interstitial opacity, septal thickening, and areas of subpleural bronchiolectasis, but without clear evidence of honeycombing. Fibrotic findings are not significantly changed in comparison to prior examinations dating back to 03/08/2020. Findings are categorized as probable UIP per consensus guidelines ? ?Assessment and  Plan ? ?Esbriet Medication Management ?Thoroughly counseled patient on the efficacy, mechanism of action, dosing, administration, adverse effects, and monitoring parameters of Esbriet.  Patient verbalized understanding. Patient education handout provided.  ? ?Goals of Therapy: Will not stop or reverse the progression of ILD. It will slow the progression of ILD.  ? ?Dosing: Starting dose will be Esbriet 267 mg 1 tablet three times daily for 2 weeks, then 2 tablets three times daily thereafter. He will remain on low dose due to his concern for GI side effects.  Stressed the importance of taking with meals and space at least 5-6 hours apart to minimize stomach upset.  ? ?Adverse Effects: ?Nausea, vomiting, diarrhea, weight loss ?Abdominal pain ?GERD ?Sun sensitivity/rash - patient advised to wear sunscreen when exposed to sunlight ?Dizziness ?Fatigue ? ?Monitoring: ?Monitor for diarrhea, nausea and vomiting, GI perforation, hepatotoxicity  ?Monitor LFTs - baseline, monthly for first 6 months, then every 3 months routinely ?CBC w differential at baseline and every 3 months routinely ? ?Access: ?Approval of Esbriet through: insurance ?Rx sent to: [facility]: [phone]  ? ?Medication Reconciliation ?A drug regimen assessment was performed, including review of allergies, interactions, disease-state management, dosing and immunization history. Medications were reviewed with the patient, including name, instructions, indication, goals of therapy, potential side effects, importance of adherence, and safe use. ? ?Immunizations ?Patient is indicated for the influenzae, pneumonia, and shingles vaccinations. ?Patient has received *** COVID19 vaccines. ? ?This appointment required 30 minutes of patient care (this includes precharting, chart review, review of results, face-to-face care, etc.). ? ?Thank you for involving pharmacy to assist in providing this patient's care.   ? ?Knox Saliva, PharmD, MPH,  BCPS ?Clinical Pharmacist (Rheumatology and Pulmonology) ?

## 2021-08-17 ENCOUNTER — Ambulatory Visit: Payer: BC Managed Care – PPO | Admitting: Nurse Practitioner

## 2021-08-17 ENCOUNTER — Encounter: Payer: Self-pay | Admitting: Nurse Practitioner

## 2021-08-17 DIAGNOSIS — F17200 Nicotine dependence, unspecified, uncomplicated: Secondary | ICD-10-CM

## 2021-08-17 DIAGNOSIS — F17210 Nicotine dependence, cigarettes, uncomplicated: Secondary | ICD-10-CM | POA: Diagnosis not present

## 2021-08-17 DIAGNOSIS — J84112 Idiopathic pulmonary fibrosis: Secondary | ICD-10-CM | POA: Diagnosis not present

## 2021-08-17 NOTE — Assessment & Plan Note (Signed)
Current everyday smoker.  Smokes 1 pack/day.  Previously tried Chantix but gave him suicidal ideation.  Does not want to start on Wellbutrin.  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  Discussed that he could try chewing on gum or toothpick for oral fixation.  Agrees that he will try to do this and cut back on smoking.  We will follow-up at next visit. ?

## 2021-08-17 NOTE — Assessment & Plan Note (Signed)
This visit was intended to be follow-up after starting on pirfenidone.  He has yet to start on medicine and states that he has not received it from the pharmacy.  We contacted [facility] to determine what was going on.  They stated that they needed consent from the patient before they would send the medication.  Patient was provided with phone number to provide this.  He did have some concerns regarding therapy including potential GI side effects and the fact that he usually does not eat breakfast.  Advised him that we could prescribe medications to help with GI side effects if they were to develop. I also advised him that he could keep small meals on hand such as protein bars or cereal bars that are easy to eat on the go to eat with his medicine.  He seemed to feel better after this conversation.  We will plan for follow-up in 6 weeks with repeat labs to see how he is doing.  Will reschedule his appointment with [name] Caller for 12 weeks from now with repeat DLCO and spirometry. ? ?Patient Instructions  ?-Recommend starting pirfenidone at 2 pills 3 times daily [we will hold off on doing the max dose of 3 pills 3 times daily) -and we will see how you handle this medication and side effects ?-Take with food. Wear sunscreen when outside ?-Please notify if you develop any side effects such as nausea or diarrhea so we can send in medications to help ? ?Please call [facility] to fill [phone] to give consent so they can fill your prescription and send to you ?  ?Follow-up ?- 6 weeks with nurse practitioner to ensure that you are doing okay with the medications ?-10-12 weeks do spirometry and DLCO ?-Return to see [name] Caller in a 30-minute visit-12 weeks to monitor progress - we will reschedule these appointments today. ? ? ?

## 2021-08-17 NOTE — Progress Notes (Signed)
? ?_0  ID: Mathew [name], male    DOB: 1950-06-18, 71 y.o.   MRN: [id number] ? ?Chief Complaint  ?Patient presents with  ? Follow-up  ?  Follow up.   ? ? ?Referring provider: ?[name], PA ? ?HPI: ?71 year old male, current everyday smoker followed for interstitial lung disease.  He is a patient [name] and last seen in office on 07/05/2021.  Due to his ongoing smoking history, had low-dose lung cancer screening of the chest that showed ILD changes.  He was referred to our office for further evaluation.  Past medical history significant for hypertension, depression, history of OSA, history of prostate cancer. ? ?TEST/EVENTS:  ?06/12/2021 HRCT chest: There is atherosclerosis present.  There are unchanged prominent subcentimeter mediastinal and hilar lymph nodes.  Mild centrilobular and paraseptal emphysema.  Diffuse bilateral bronchial wall thickening.  Moderate pulmonary fibrosis in a pattern with apical to basal gradient, featuring irregular peripheral interstitial opacities, septal thickening, areas of subpleural bronchiolectasis, and without clear evidence of honeycombing.  No significant air trapping.  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 ?-Serologies neg ?07/06/2021: PFTs: FVC 53, FEV1 57, ratio 83, TLC 62, DLCO corrected for alveolar volume 51.  Restrictive airway disease with reduced lung volumes and severe diffusion defect. ? ?07/05/2021: OV with [name] Caller follow-up after PFTs.  Continues to smoke.  High-resolution CT scan reported as probable UIP.  Felt as though there was some honeycombing although radiology did not agree that that there was.  Serology essentially negative.  ILD questionnaire also negative.  Pulmonary function testing did show moderate restriction with reduction in diffusion capacity.  Based on ATS criteria with negative history, negative serology and probable UIP pattern, diagnosis is IPF.  Did plan to discuss his case at ILD conference to determine if there is agreements on  probable UIP pattern.  If there is no congruency or discordant interpretation of his CT scan, advised patient that he would need a lung biopsy.  Had a conversation about possible treatment options which patient expressed he would like some form of treatment.  Agreeable to trying Esbriet.  Refer to pharmacist for further counseling.  Advised that as bradycardia is less biologically available in the presence of smoking.  Did not want to do Chantix again because gave him suicidal ideation also did not want to do Wellbutrin.  Felt like he would just quit smoking on his own. ? ?08/17/2021: Today-follow-up ?Patient presents today for what was supposed to be follow-up after starting on pirfenidone.  He reports that he has yet to start as he has not received the prescription from [facility].  He did have some reservations about starting given GI side effects.  We discussed these today in detail.  Advised him that if he develops any nausea that he can contact us and we can try him on Zofran to prevent this.  Also worried that he does not usually eat breakfast.  Reports that his breathing overall is unchanged.  No significant worsening of his symptoms since we saw him last. Continues to smoke. ? ?No Known Allergies ? ?Immunization History  ?Administered Date(s) Administered  ? HiB (PRP-OMP) 05/02/2021  ? Influenza, High Dose Seasonal PF 02/08/2019, 03/02/2020, 03/02/2021  ? PFIZER Comirnaty(Gray Top)Covid-19 Tri-Sucrose Vaccine 08/10/2020  ? PFIZER(Purple Top)SARS-COV-2 Vaccination 06/27/2019, 07/27/2019  ? Pension scheme manager 8yr & up 05/02/2021  ? Pneumococcal Conjugate-13 02/08/2019  ? Pneumococcal Polysaccharide-23 03/02/2020  ? Tdap 03/02/2021  ? ? ?Past Medical History:  ?Diagnosis Date  ?  Hepatitis C   ? s/p treatment  ? Hypertension   ? Obstructive sleep apnea   ? unable to tolerate CPAP  ? Premature ventricular contraction   ? Prostate cancer [facility]) 2006  ? ? ?Tobacco History: ?Social History   ? ?Tobacco Use  ?Smoking Status Every Day  ? Packs/day: 1.00  ? Years: 13.00  ? Pack years: 13.00  ? Types: Cigarettes  ?Smokeless Tobacco Never  ?Tobacco Comments  ? Smokes 1ppd. May not smoke them all, may throw some of them out.  Never smokes a whole one.  ? ?Ready to quit: Not Answered ?Counseling given: Not Answered ?Tobacco comments: Smokes 1ppd. May not smoke them all, may throw some of them out.  Never smokes a whole one. ? ? ?Outpatient Medications Prior to Visit  ?Medication Sig Dispense Refill  ? amLODipine (NORVASC) 5 MG tablet Take 1 tablet (5 mg total) by mouth daily. Please make overdue appt with Dr. Rayann Heman before anymore refills. 3rd and Final Attempt 15 tablet 0  ? escitalopram (LEXAPRO) 10 MG tablet Take 10 mg by mouth daily.    ? losartan (COZAAR) 100 MG tablet Take 1 tablet (100 mg total) by mouth daily. Please make overdue appt with Dr. Rayann Heman before anymore refills. 3rd and Final Attempt 15 tablet 0  ? metoprolol succinate (TOPROL-XL) 100 MG 24 hr tablet Take 1 tablet (100 mg total) by mouth daily. NEED TO SCHEDULE APPOINTMENT 20 tablet 0  ? Multiple Vitamin (MULTIVITAMIN WITH MINERALS) TABS tablet Take 1 tablet by mouth daily.    ? Pirfenidone 267 MG TABS Month 1: Take 1 tablet three times daily for 2 weeks then increase to 2 tablets three times daily thereafter. **STAY ON LOW DOSE** 138 tablet 0  ? Pirfenidone 267 MG TABS Take 2 tablets (534 mg total) by mouth 3 (three) times daily with meals. Month 2 onwards 180 tablet 5  ? pravastatin (PRAVACHOL) 20 MG tablet Take 20 mg by mouth daily.    ? valACYclovir (VALTREX) 500 MG tablet Take 1 tablet by mouth daily as needed. For cold sore flare ups    ? ?No facility-administered medications prior to visit.  ? ? ? ?Review of Systems:  ? ?Constitutional: No weight loss or gain, night sweats, fevers, chills +mild fatigue ?HEENT: No headaches, difficulty swallowing, tooth/dental problems, or sore throat. No sneezing, itching, ear ache, nasal  congestion, or post nasal drip ?CV:  No chest pain, orthopnea, PND, swelling in lower extremities, anasarca, dizziness, palpitations, syncope ?Resp: +shortness of breath with exertion; occasional cough. No excess mucus or change in color of mucus. No hemoptysis. No wheezing.  No chest wall deformity ?GI:  No heartburn, indigestion, abdominal pain, nausea, vomiting, diarrhea, change in bowel habits, loss of appetite, bloody stools.  ?GU: No dysuria, change in color of urine, urgency or frequency.  No flank pain, no hematuria  ?Skin: No rash, lesions, ulcerations ?MSK:  No joint pain or swelling.  No decreased range of motion.  No back pain. ?Neuro: No dizziness or lightheadedness.  ?Psych: No depression or anxiety. Mood stable.  ? ? ? ?Physical Exam: ? ?BP (!) 138/94 (BP Location: Right Arm, Patient Position: Sitting, Cuff Size: Normal)   Pulse 76   Temp 98.6 ?F (37 ?C) (Oral)   Ht _0  (1.905 m)   Wt 215 lb 9.6 oz (97.8 kg)   SpO2 96%   BMI 26.95 kg/m?  ? ?GEN: Pleasant, interactive, well-appearing; in no acute distress. ?HEENT:  Normocephalic and atraumatic. PERRLA. Sclera  white. Nasal turbinates pink, moist and patent bilaterally. No rhinorrhea present. Oropharynx pink and moist, without exudate or edema. No lesions, ulcerations, or postnasal drip.  ?NECK:  Supple w/ fair ROM. No JVD present. Normal carotid impulses w/o bruits. Thyroid symmetrical with no goiter or nodules palpated. No lymphadenopathy.   ?CV: RRR, no m/r/g, no peripheral edema. Pulses intact, +2 bilaterally. No cyanosis, pallor or clubbing. ?PULMONARY:  Unlabored, regular breathing. Minimal crackles b/l bases othwerise clear bilaterally A&P w/o wheezes/rales/rhonchi. No accessory muscle use. No dullness to percussion. ?GI: BS present and normoactive. Soft, non-tender to palpation. No organomegaly or masses detected. No CVA tenderness. ?MSK: No erythema, warmth or tenderness. Cap refil <2 sec all extrem. No deformities or joint swelling  noted.  ?Neuro: A/Ox3. No focal deficits noted.   ?Skin: Warm, no lesions or rashe ?Psych: Normal affect and behavior. Judgement and thought content appropriate.  ? ? ? ?Lab Results: ? ?CBC ?   ?Component Value Date/Time  ? WB

## 2021-08-17 NOTE — Patient Instructions (Addendum)
-  Recommend starting pirfenidone at 2 pills 3 times daily [we will hold off on doing the max dose of 3 pills 3 times daily) -and we will see how you handle this medication and side effects ?-Take with food. Wear sunscreen when outside ?-Please notify if you develop any side effects such as nausea or diarrhea so we can send in medications to help ? ?Please call [facility] to fill [phone] to give consent so they can fill your prescription and send to you ?  ?Follow-up ?- 6 weeks with nurse practitioner to ensure that you are doing okay with the medications ?-10-12 weeks do spirometry and DLCO ?-Return to see [name] Caller in a 30-minute visit-12 weeks to monitor progress - we will reschedule these appointments today. ?

## 2021-10-10 ENCOUNTER — Other Ambulatory Visit: Payer: Self-pay | Admitting: Internal Medicine

## 2021-10-10 DIAGNOSIS — J84112 Idiopathic pulmonary fibrosis: Secondary | ICD-10-CM

## 2021-10-11 ENCOUNTER — Ambulatory Visit: Payer: BC Managed Care – PPO | Admitting: Internal Medicine

## 2021-10-16 ENCOUNTER — Ambulatory Visit: Payer: BC Managed Care – PPO | Admitting: Nurse Practitioner

## 2021-10-16 ENCOUNTER — Encounter: Payer: Self-pay | Admitting: Nurse Practitioner

## 2021-10-16 VITALS — BP 122/80 | HR 61 | Ht 75.0 in | Wt 212.4 lb

## 2021-10-16 DIAGNOSIS — T887XXA Unspecified adverse effect of drug or medicament, initial encounter: Secondary | ICD-10-CM | POA: Insufficient documentation

## 2021-10-16 DIAGNOSIS — J84112 Idiopathic pulmonary fibrosis: Secondary | ICD-10-CM

## 2021-10-16 LAB — COMPREHENSIVE METABOLIC PANEL
ALT: 35 U/L (ref 0–53)
AST: 25 U/L (ref 0–37)
Albumin: 4.2 g/dL (ref 3.5–5.2)
Alkaline Phosphatase: 77 U/L (ref 39–117)
BUN: 11 mg/dL (ref 6–23)
CO2: 25 mEq/L (ref 19–32)
Calcium: 9.6 mg/dL (ref 8.4–10.5)
Chloride: 102 mEq/L (ref 96–112)
Creatinine, Ser: 0.78 mg/dL (ref 0.40–1.50)
GFR: 90.9 mL/min (ref >60.00)
Glucose, Bld: 108 mg/dL — ABNORMAL HIGH (ref 70–99)
Potassium: 3.6 mEq/L (ref 3.5–5.1)
Sodium: 134 mEq/L — ABNORMAL LOW (ref 135–145)
Total Bilirubin: 0.6 mg/dL (ref 0.2–1.2)
Total Protein: 7.6 g/dL (ref 6.0–8.3)

## 2021-10-16 MED ORDER — LOPERAMIDE HCL 2 MG PO TABS: 4.0000 mg | ORAL_TABLET | Freq: Four times a day (QID) | ORAL | 0 refills | Status: DC | PRN

## 2021-10-16 NOTE — Patient Instructions (Addendum)
Try restarting your Esbriet 1 pill Three times a day for one week then increase to 2 pills Three times a day if you are able to tolerate it. Take with food.   Take 2 mg (1 tab) of Imodium after the first lose stool. If you continue to have loose stools/diarrhea, you can take 4 mg (2 tabs) after every bowel movement up to 4 times a day until diarrhea resolves. Do not take if you are having formed stools. Be aware that you could develop constipation if you take too often or too much.   Labs today - [facility]  Follow up 7/5 at 3:30 pm with [name] Caller. If symptoms do not improve or worsen, please contact office for sooner follow up or seek emergency care.

## 2021-10-16 NOTE — Progress Notes (Signed)
_0  ID: Mathew [name], male    DOB: [birth year], 71 y.o.   MRN: [id number]  Chief Complaint  Patient presents with   Follow-up    Pt states that he has been having side effects while taking the Esbriet medication.  Pt states when he tries to take the medication, he states that it has really been messing with his stomach so he has not been able to take it.    Referring provider: [name], PA  HPI: 71 year old male, current everyday smoker followed for interstitial lung disease.  He is a patient [name] and last seen in office on 08/17/2021 by [facility] NP.  Due to his ongoing smoking history, had low-dose lung cancer screening of the chest that showed ILD changes.  He was referred to our office for further evaluation.  Past medical history significant for hypertension, depression, history of OSA, history of prostate cancer.  TEST/EVENTS:  06/12/2021 HRCT chest: There is atherosclerosis present.  There are unchanged prominent subcentimeter mediastinal and hilar lymph nodes.  Mild centrilobular and paraseptal emphysema.  Diffuse bilateral bronchial wall thickening.  Moderate pulmonary fibrosis in a pattern with apical to basal gradient, featuring irregular peripheral interstitial opacities, septal thickening, areas of subpleural bronchiolectasis, and without clear evidence of honeycombing.  No significant air trapping.  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 -Serologies neg 07/06/2021: PFTs: FVC 53, FEV1 57, ratio 83, TLC 62, DLCO corrected for alveolar volume 51.  Restrictive airway disease with reduced lung volumes and severe diffusion defect.  07/05/2021: OV with [name] Caller follow-up after PFTs.  Continues to smoke.  High-resolution CT scan reported as probable UIP.  Felt as though there was some honeycombing although radiology did not agree that that there was.  Serology essentially negative.  ILD questionnaire also negative.  Pulmonary function testing did show moderate restriction with  reduction in diffusion capacity.  Based on ATS criteria with negative history, negative serology and probable UIP pattern, diagnosis is IPF.  Did plan to discuss his case at ILD conference to determine if there is agreements on probable UIP pattern.  If there is no congruency or discordant interpretation of his CT scan, advised patient that he would need a lung biopsy.  Had a conversation about possible treatment options which patient expressed he would like some [name] of treatment.  Agreeable to trying Esbriet.  Refer to pharmacist for further counseling.  Advised that as bradycardia is less biologically available in the presence of smoking.  Did not want to do Chantix again because gave him suicidal ideation also did not want to do Wellbutrin.  Felt like he would just quit smoking on his own.  08/17/2021: OV with [name] NP for what was supposed to be follow-up after starting on pirfenidone.  He reports that he has yet to start as he has not received the prescription from [facility].  He did have some reservations about starting given GI side effects.  We discussed these today in detail.  Advised him that if he develops any nausea that he can contact us and we can try him on Zofran to prevent this.  Also worried that he does not usually eat breakfast.  He was agreeable to eating something small. Reports that his breathing overall is unchanged.  No significant worsening of his symptoms since we saw him last. Reached out to [facility], who just needed consent from the patient. Provided him with the contact info and he was able to get started on this. Current smoker -  advised to try chewing gum or something for oral fixation. Did not want to try any pharmacological therapy at this point.   10/16/2021: Today - follow up Patient presents for overdue follow up after starting Esbriet.  He reports that he was never able to get up to 2 pills 3 times a day.  He was taking 1 pill 3 times a day but stopped 2 weeks ago as he was  having problems with GI side effects.  He reports that every time he would eat or take the medicine he ended up having loose stools; did have an episode of incontinence related to this.  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  Since stopping the Esbriet, his symptoms have resolved and stools have returned to normal.  He denied having any nausea or vomiting while on the medication.  No weight loss or anorexia.  Breathing overall is unchanged from his baseline.  He does continue to smoke around a pack a day.  No Known Allergies  Immunization History  Administered Date(s) Administered   HiB (PRP-OMP) 05/02/2021   Influenza, High Dose Seasonal PF 02/08/2019, 03/02/2020, 03/02/2021   PFIZER Comirnaty(Gray Top)Covid-19 Tri-Sucrose Vaccine 08/10/2020   PFIZER(Purple Top)SARS-COV-2 Vaccination 06/27/2019, 07/27/2019   Pfizer Covid-19 Vaccine Bivalent Booster 67yr & up 05/02/2021   Pneumococcal Conjugate-13 02/08/2019   Pneumococcal Polysaccharide-23 03/02/2020   Tdap 03/02/2021    Past Medical History:  Diagnosis Date   Hepatitis C    s/p treatment   Hypertension    Obstructive sleep apnea    unable to tolerate CPAP   Premature ventricular contraction    Prostate cancer ([place] 2006    Tobacco History: Social History   Tobacco Use  Smoking Status Every Day   Packs/day: 1.00   Years: 13.00   Total pack years: 13.00   Types: Cigarettes  Smokeless Tobacco Never  Tobacco Comments   Smokes 1ppd. May not smoke them all, may throw some of them out.  Never smokes a whole one.   Ready to quit: Not Answered Counseling given: Not Answered Tobacco comments: Smokes 1ppd. May not smoke them all, may throw some of them out.  Never smokes a whole one.   Outpatient Medications Prior to Visit  Medication Sig Dispense Refill   amLODipine (NORVASC) 5 MG tablet Take  1 tablet (5 mg total) by mouth daily. Please make overdue appt with Dr. ARayann Hemanbefore anymore refills. 3rd and Final Attempt 15 tablet 0   escitalopram (LEXAPRO) 10 MG tablet Take 10 mg by mouth daily.     losartan (COZAAR) 100 MG tablet Take 1 tablet (100 mg total) by mouth daily. Please make overdue appt with Dr. ARayann Hemanbefore anymore refills. 3rd and Final Attempt 15 tablet 0   metoprolol succinate (TOPROL-XL) 100 MG 24 hr tablet Take 1 tablet (100 mg total) by mouth daily. NEED TO SCHEDULE APPOINTMENT 20 tablet 0   Multiple Vitamin (MULTIVITAMIN WITH MINERALS) TABS tablet Take 1 tablet by mouth daily.     pravastatin (PRAVACHOL) 20 MG tablet Take 20 mg by mouth daily.     valACYclovir (VALTREX) 500 MG tablet Take 1 tablet by mouth daily as needed. For cold sore flare ups     Pirfenidone 267 MG TABS Month 1: Take 1 tablet three times daily for 2 weeks then increase to 2 tablets three  times daily thereafter. **STAY ON LOW DOSE** (Patient not taking: Reported on 10/16/2021) 138 tablet 0   Pirfenidone 267 MG TABS Take 2 tablets (534 mg total) by mouth 3 (three) times daily with meals. Month 2 onwards (Patient not taking: Reported on 10/16/2021) 180 tablet 5   No facility-administered medications prior to visit.     Review of Systems:   Constitutional: No weight loss or gain, night sweats, fevers, chills +mild fatigue HEENT: No headaches, difficulty swallowing, tooth/dental problems, or sore throat. No sneezing, itching, ear ache, nasal congestion, or post nasal drip CV:  No chest pain, orthopnea, PND, swelling in lower extremities, anasarca, dizziness, palpitations, syncope Resp: +shortness of breath with exertion; occasional cough. No excess mucus or change in color of mucus. No hemoptysis. No wheezing.  No chest wall deformity GI:  + Diarrhea (resolved after stopping Esbriet).  No heartburn, indigestion, abdominal pain, nausea, vomiting, loss of appetite, bloody stools.  GU: No dysuria, change  in color of urine, urgency or frequency.  No flank pain, no hematuria  Skin: No rash, lesions, ulcerations MSK:  No joint pain or swelling.  No decreased range of motion.  No back pain. Neuro: No dizziness or lightheadedness.  Psych: No depression or anxiety. Mood stable.     Physical Exam:  BP 122/80 (BP Location: Right Arm, Patient Position: Sitting, Cuff Size: Normal)   Pulse 61   Ht _0  (1.905 m)   Wt 212 lb 6.4 oz (96.3 kg)   SpO2 97% Comment: RA  BMI 26.55 kg/m   GEN: Pleasant, interactive, well-appearing; in no acute distress. HEENT:  Normocephalic and atraumatic. PERRLA. Sclera white. Nasal turbinates pink, moist and patent bilaterally. No rhinorrhea present. Oropharynx pink and moist, without exudate or edema. No lesions, ulcerations, or postnasal drip.  NECK:  Supple w/ fair ROM. No JVD present. Normal carotid impulses w/o bruits. Thyroid symmetrical with no goiter or nodules palpated. No lymphadenopathy.   CV: RRR, no m/r/g, no peripheral edema. Pulses intact, +2 bilaterally. No cyanosis, pallor or clubbing. PULMONARY:  Unlabored, regular breathing. Minimal crackles b/l bases othwerise clear bilaterally A&P w/o wheezes/rales/rhonchi. No accessory muscle use. No dullness to percussion. GI: BS present and normoactive. Soft, non-tender to palpation. No organomegaly or masses detected. No CVA tenderness. MSK: No erythema, warmth or tenderness. Cap refil <2 sec all extrem. No deformities or joint swelling noted.  Neuro: A/Ox3. No focal deficits noted.   Skin: Warm, no lesions or rashe Psych: Normal affect and behavior. Judgement and thought content appropriate.     Lab Results:  CBC    Component Value Date/Time   WBC 13.3 (H) 06/12/2018 0815   RBC 5.10 06/12/2018 0815   HGB 15.0 06/12/2018 0815   HCT 46.7 06/12/2018 0815   PLT 205 06/12/2018 0815   MCV 91.6 06/12/2018 0815   MCH 29.4 06/12/2018 0815   MCHC 32.1 06/12/2018 0815   RDW 15.1 06/12/2018 0815    LYMPHSABS 4.5 (H) 06/12/2018 0815   MONOABS 1.2 (H) 06/12/2018 0815   EOSABS 0.6 (H) 06/12/2018 0815   BASOSABS 0.2 (H) 06/12/2018 0815    BMET    Component Value Date/Time   NA 133 (L) 06/12/2018 0815   K 3.5 06/12/2018 0815   CL 103 06/12/2018 0815   CO2 24 06/12/2018 0815   GLUCOSE 93 06/12/2018 0815   BUN 10 06/12/2018 0815   CREATININE 0.74 06/12/2018 0815   CALCIUM 9.1 06/12/2018 0815   GFRNONAA >60 06/12/2018 0815   GFRAA >60 06/12/2018  0815    BNP No results found for: "BNP"   Imaging:  No results found.       Latest Ref Rng & Units 07/06/2021    3:56 PM  PFT Results  FVC-Predicted Pre % 57   FVC-Post L 2.42   FVC-Predicted Post % 53   Pre FEV1/FVC % % 81   Post FEV1/FCV % % 83   FEV1-Pre L 2.13   FEV1-Predicted Pre % 61   FEV1-Post L 2.00   DLCO uncorrected ml/min/mmHg 15.27   DLCO UNC% % 51   DLCO corrected ml/min/mmHg 15.27   DLCO COR %Predicted % 51   DLVA Predicted % 82   TLC L 4.93   TLC % Predicted % 62   RV % Predicted % 76     No results found for: "NITRICOXIDE"      Assessment & Plan:   IPF (idiopathic pulmonary fibrosis) (HCC) Difficulties tolerating Esbriet due to GI side effects.  Has had a brief holiday from it.  Advised that we try restarting at 1 pill 3 times daily for a week then attempt to increase to 2 pills 3 times daily if he is able to tolerate; otherwise stay at 1 pill 3 times daily.  Recommended that he use Imodium for loose stools/diarrhea. Check CMET today prior to restarting. If we are unable to control his GI side effects on antifibrotics, we will need to discuss other options such as clinical trials/research. Follow up with [name] Caller next month to see how he is doing after restarting and discuss next steps if no improvement.  Patient Instructions  Try restarting your Esbriet 1 pill Three times a day for one week then increase to 2 pills Three times a day if you are able to tolerate it. Take with food.   Take 2  mg (1 tab) of Imodium after the first lose stool. If you continue to have loose stools/diarrhea, you can take 4 mg (2 tabs) after every bowel movement up to 4 times a day until diarrhea resolves. Do not take if you are having formed stools. Be aware that you could develop constipation if you take too often or too much.   Labs today - CMET  Follow up 7/5 at 3:30 pm with [name] Caller. If symptoms do not improve or worsen, please contact office for sooner follow up or seek emergency care.     Medication side effect Diarrhea with Esbriet. Resolved after stopping. Trial restarting therapy and add Imodium as needed.    I spent 32 minutes of dedicated to the care of this patient on the date of this encounter to include pre-visit review of records, face-to-face time with the patient discussing conditions above, post visit ordering of testing, clinical documentation with the electronic health record, making appropriate referrals as documented, and communicating necessary findings to members of the patients care team.  Clayton Bibles, NP 10/16/2021  Pt aware and understands NP's role.

## 2021-10-16 NOTE — Assessment & Plan Note (Signed)
Difficulties tolerating Esbriet due to GI side effects.  Has had a brief holiday from it.  Advised that we try restarting at 1 pill 3 times daily for a week then attempt to increase to 2 pills 3 times daily if he is able to tolerate; otherwise stay at 1 pill 3 times daily.  Recommended that he use Imodium for loose stools/diarrhea. Check CMET today prior to restarting. If we are unable to control his GI side effects on antifibrotics, we will need to discuss other options such as clinical trials/research. Follow up with [name] Caller next month to see how he is doing after restarting and discuss next steps if no improvement.  Patient Instructions  Try restarting your Esbriet 1 pill Three times a day for one week then increase to 2 pills Three times a day if you are able to tolerate it. Take with food.   Take 2 mg (1 tab) of Imodium after the first lose stool. If you continue to have loose stools/diarrhea, you can take 4 mg (2 tabs) after every bowel movement up to 4 times a day until diarrhea resolves. Do not take if you are having formed stools. Be aware that you could develop constipation if you take too often or too much.   Labs today - CMET  Follow up 7/5 at 3:30 pm with [name] Caller. If symptoms do not improve or worsen, please contact office for sooner follow up or seek emergency care.

## 2021-10-16 NOTE — Assessment & Plan Note (Signed)
Diarrhea with Esbriet. Resolved after stopping. Trial restarting therapy and add Imodium as needed.

## 2021-10-23 ENCOUNTER — Ambulatory Visit (INDEPENDENT_AMBULATORY_CARE_PROVIDER_SITE_OTHER): Payer: BC Managed Care – PPO | Admitting: Internal Medicine

## 2021-10-23 DIAGNOSIS — J84112 Idiopathic pulmonary fibrosis: Secondary | ICD-10-CM | POA: Diagnosis not present

## 2021-10-23 LAB — PULMONARY FUNCTION TEST
DL/VA % pred: 84 %
DL/VA: 3.37 ml/min/mmHg/L
DLCO cor % pred: 53 %
DLCO cor: 16.1 ml/min/mmHg
DLCO unc % pred: 53 %
DLCO unc: 16.1 ml/min/mmHg
FEF 25-75 Pre: 2.51 L/sec
FEF2575-%Pred-Pre: 84 %
FEV1-%Pred-Pre: 69 %
FEV1-Pre: 2.41 L
FEV1FVC-%Pred-Pre: 106 %
FEV6-%Pred-Pre: 67 %
FEV6-Pre: 2.94 L
FEV6FVC-%Pred-Pre: 103 %
FVC-%Pred-Pre: 64 %
FVC-Pre: 2.95 L
Pre FEV1/FVC ratio: 82 %
Pre FEV6/FVC Ratio: 100 %

## 2021-10-23 NOTE — Progress Notes (Signed)
Spirometry and dlco done today.

## 2021-11-07 ENCOUNTER — Encounter: Payer: Self-pay | Admitting: Internal Medicine

## 2021-11-07 ENCOUNTER — Ambulatory Visit: Payer: BC Managed Care – PPO | Admitting: Internal Medicine

## 2021-11-07 VITALS — BP 132/84 | HR 73 | Temp 98.6°F | Ht 75.0 in | Wt 215.4 lb

## 2021-11-07 DIAGNOSIS — K521 Toxic gastroenteritis and colitis: Secondary | ICD-10-CM | POA: Diagnosis not present

## 2021-11-07 DIAGNOSIS — J84112 Idiopathic pulmonary fibrosis: Secondary | ICD-10-CM | POA: Diagnosis not present

## 2021-11-07 DIAGNOSIS — F17200 Nicotine dependence, unspecified, uncomplicated: Secondary | ICD-10-CM

## 2021-11-07 DIAGNOSIS — T887XXA Unspecified adverse effect of drug or medicament, initial encounter: Secondary | ICD-10-CM | POA: Diagnosis not present

## 2021-11-07 NOTE — Addendum Note (Signed)
Addended by: Irine Seal B on: 11/07/2021 04:45 PM   Modules accepted: Orders

## 2021-11-07 NOTE — Patient Instructions (Addendum)
ICD-10-CM   1. IPF (idiopathic pulmonary fibrosis) ([place])  J84.112     2. Medication side effect  T88.7XXA     3. Diarrhea due to drug  K52.1     4. Smoker  F17.200        Clinically stable  Significant intolerance to esbriet due to diarrhea and life style inconvenience  Respect no more approved antifibrotic due to side effect profile  Plan - list esbriet as allergy (diarrhea)  - take consent form for TETON study  - [facility] staff will be in touch - do standard of care spirometry and dlco in 4 months - QUit smoking  Followup  - 30 min visit with Dr Chase Caller standard of care visit but after PFT  - symptom score and walk test   - can cancel if you are in research

## 2021-11-07 NOTE — Progress Notes (Signed)
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 (LEXAPRO) 10 MG tablet, Take 10 mg by mouth daily., Disp: , Rfl:    loperamide (IMODIUM A-D) 2 MG tablet, Take 2 tablets (4 mg total) by mouth 4 (four) times daily as needed for diarrhea or loose stools., Disp: 30 tablet, Rfl: 0   losartan (COZAAR) 100 MG tablet, Take 1 tablet (100 mg total) by mouth daily. Please make overdue appt with Dr. ARayann Hemanbefore anymore refills. 3rd and Final Attempt, Disp: 15 tablet, Rfl: 0   metoprolol succinate (TOPROL-XL) 100 MG 24 hr tablet, Take 1 tablet (100 mg total) by mouth daily. NEED TO SCHEDULE APPOINTMENT, Disp: 20 tablet, Rfl: 0   Multiple Vitamin (MULTIVITAMIN WITH MINERALS) TABS tablet, Take 1 tablet by mouth daily., Disp: , Rfl:    Pirfenidone 267 MG TABS, Month 1: Take 1 tablet three  times daily for 2 weeks then increase to 2 tablets three times daily thereafter. **STAY ON LOW DOSE**, Disp: 138 tablet, Rfl: 0   Pirfenidone 267 MG TABS, Take 2 tablets (534 mg total) by mouth 3 (three) times daily with meals. Month 2 onwards, Disp: 180 tablet, Rfl: 5   pravastatin (PRAVACHOL) 20 MG tablet, Take 20 mg by mouth daily., Disp: , Rfl:    valACYclovir (VALTREX) 500 MG tablet, Take 1 tablet by mouth daily as needed. For cold sore flare ups, Disp: , Rfl:       Objective:   Vitals:   11/07/21 1520  BP: 132/84  Pulse: 73  Temp: 98.6 F (37 C)  TempSrc: Oral  SpO2: 95%  Weight: 215 lb 6.4 oz (97.7 kg)  Height: _0  (1.905 m)    Estimated body mass index is 26.92 kg/m as calculated from the following:   Height as of this encounter: _1  (1.905 m).   Weight as of this encounter: 215 lb 6.4 oz (97.7 kg).  _2 @  Filed Weights   11/07/21 1520  Weight: 215 lb 6.4 oz (97.7 kg)     Physical Exam General: No distress. Looks well Neuro: Alert and Oriented x 3. GCS 15. Speech normal Psych: Pleasant Resp:  Barrel Chest - no.  Wheeze - no, Crackles - YES, No overt respiratory distress CVS: Normal heart sounds. Murmurs - no Ext: Stigmata of Connective Tissue Disease - no HEENT: Normal upper airway. PEERL +. No post nasal drip        Assessment:       ICD-10-CM   1. IPF (idiopathic pulmonary fibrosis) ([place]  JV67.209  2. Medication side effect  T88.7XXA     3. Diarrhea due to drug  K52.1     4. Smoker  F17.200          Plan:     Patient Instructions     ICD-10-CM   1. IPF (idiopathic pulmonary fibrosis) ([place])  J84.112     2. Medication side effect  T88.7XXA     3. Diarrhea due to drug  K52.1     4. Smoker  F17.200        Clinically stable  Significant intolerance to esbriet due to diarrhea and life style inconvenience  Respect no more approved antifibrotic due to side effect profile  Plan - list esbriet as allergy (diarrhea)   - take consent form for TETON study  - [facility] staff will be in touch - do standard of care spirometry and dlco in 4 months - QUit smoking  Followup  - 30 min visit with Dr Chase Caller standard of care visit but after PFT  - symptom score and walk test   - can cancel if you are in research  ( Level 05 visit: Estb 40-54 min   visit type: on-site physical face to visit  in total care time and counseling or/and coordination of care by this undersigned MD - Dr Brand Males. This includes one or more of the following on this same day 11/07/2021: pre-charting, chart review, note writing, documentation discussion of test results, diagnostic or treatment recommendations, prognosis, risks and benefits of management options, instructions, education, compliance or risk-factor reduction. It excludes time spent by the [place] or office staff in the care of the patient. Actual time 78 min)   SIGNATURE    Dr. Brand Males, M.D., F.C.C.P,  Pulmonary and Critical Care Medicine Staff Physician, [place] Director - Interstitial Lung Disease  Program  Pulmonary [place] [facility] [place], [place], [address]  Pager: [phone], If no answer or between  15:00h - 7:00h: call [phone] Telephone: [phone]  4:22 PM 11/07/2021

## 2021-12-31 DIAGNOSIS — C61 Malignant neoplasm of prostate: Secondary | ICD-10-CM | POA: Diagnosis not present

## 2022-01-11 DIAGNOSIS — N5231 Erectile dysfunction following radical prostatectomy: Secondary | ICD-10-CM | POA: Diagnosis not present

## 2022-01-11 DIAGNOSIS — Z8546 Personal history of malignant neoplasm of prostate: Secondary | ICD-10-CM | POA: Diagnosis not present

## 2022-01-11 DIAGNOSIS — R319 Hematuria, unspecified: Secondary | ICD-10-CM | POA: Diagnosis not present

## 2022-02-27 DIAGNOSIS — J069 Acute upper respiratory infection, unspecified: Secondary | ICD-10-CM | POA: Diagnosis not present

## 2022-02-27 DIAGNOSIS — Z20822 Contact with and (suspected) exposure to covid-19: Secondary | ICD-10-CM | POA: Diagnosis not present

## 2022-08-19 ENCOUNTER — Other Ambulatory Visit: Payer: Self-pay

## 2022-08-19 ENCOUNTER — Telehealth: Payer: Self-pay | Admitting: Hematology

## 2022-08-19 ENCOUNTER — Emergency Department (HOSPITAL_COMMUNITY)
Admission: EM | Admit: 2022-08-19 | Discharge: 2022-08-19 | Disposition: A | Discharge status: Home / Self Care | Payer: BC Managed Care – PPO | Attending: Emergency Medicine | Admitting: Emergency Medicine

## 2022-08-19 ENCOUNTER — Emergency Department (HOSPITAL_COMMUNITY): Payer: BC Managed Care – PPO

## 2022-08-19 DIAGNOSIS — Z7901 Long term (current) use of anticoagulants: Secondary | ICD-10-CM | POA: Insufficient documentation

## 2022-08-19 DIAGNOSIS — R160 Hepatomegaly, not elsewhere classified: Secondary | ICD-10-CM | POA: Insufficient documentation

## 2022-08-19 DIAGNOSIS — I81 Portal vein thrombosis: Secondary | ICD-10-CM | POA: Diagnosis not present

## 2022-08-19 DIAGNOSIS — Z79899 Other long term (current) drug therapy: Secondary | ICD-10-CM | POA: Diagnosis not present

## 2022-08-19 DIAGNOSIS — R140 Abdominal distension (gaseous): Secondary | ICD-10-CM | POA: Diagnosis present

## 2022-08-19 LAB — URINALYSIS, ROUTINE W REFLEX MICROSCOPIC
Bilirubin Urine: NEGATIVE
Glucose, UA: NEGATIVE mg/dL
Ketones, ur: NEGATIVE mg/dL
Leukocytes,Ua: NEGATIVE
Nitrite: NEGATIVE
Protein, ur: 30 mg/dL — AB
Specific Gravity, Urine: 1.018 (ref 1.005–1.030)
pH: 6 (ref 5.0–8.0)

## 2022-08-19 LAB — COMPREHENSIVE METABOLIC PANEL
ALT: 24 U/L (ref 0–44)
AST: 53 U/L — ABNORMAL HIGH (ref 15–41)
Albumin: 3.5 g/dL (ref 3.5–5.0)
Alkaline Phosphatase: 84 U/L (ref 38–126)
Anion gap: 15 (ref 5–15)
BUN: 16 mg/dL (ref 8–23)
CO2: 21 mmol/L — ABNORMAL LOW (ref 22–32)
Calcium: 9.2 mg/dL (ref 8.9–10.3)
Chloride: 98 mmol/L (ref 98–111)
Creatinine, Ser: 0.96 mg/dL (ref 0.61–1.24)
GFR, Estimated: >60 mL/min (ref >60)
Glucose, Bld: 173 mg/dL — ABNORMAL HIGH (ref 70–99)
Potassium: 3.7 mmol/L (ref 3.5–5.1)
Sodium: 134 mmol/L — ABNORMAL LOW (ref 135–145)
Total Bilirubin: 1.4 mg/dL — ABNORMAL HIGH (ref 0.3–1.2)
Total Protein: 7.4 g/dL (ref 6.5–8.1)

## 2022-08-19 LAB — CBC
HCT: 50.4 % (ref 39.0–52.0)
Hemoglobin: 16.5 g/dL (ref 13.0–17.0)
MCH: 29.3 pg (ref 26.0–34.0)
MCHC: 32.7 g/dL (ref 30.0–36.0)
MCV: 89.5 fL (ref 80.0–100.0)
Platelets: 205 10*3/uL (ref 150–400)
RBC: 5.63 MIL/uL (ref 4.22–5.81)
RDW: 15.3 % (ref 11.5–15.5)
WBC: 10.8 10*3/uL — ABNORMAL HIGH (ref 4.0–10.5)
nRBC: 0 % (ref 0.0–0.2)

## 2022-08-19 LAB — TROPONIN I (HIGH SENSITIVITY): Troponin I (High Sensitivity): 6 ng/L (ref <18)

## 2022-08-19 LAB — LIPASE, BLOOD: Lipase: 23 U/L (ref 11–51)

## 2022-08-19 MED ORDER — APIXABAN 5 MG PO TABS: 5.0000 mg | ORAL_TABLET | Freq: Two times a day (BID) | ORAL | Status: DC

## 2022-08-19 MED ORDER — IOHEXOL 300 MG/ML  SOLN
100.0000 mL | Freq: Once | INTRAMUSCULAR | Status: AC | PRN
  Administered 2022-08-19: 100 mL via INTRAVENOUS

## 2022-08-19 MED ORDER — ONDANSETRON HCL 4 MG/2ML IJ SOLN
4.0000 mg | Freq: Once | INTRAMUSCULAR | Status: AC
  Administered 2022-08-19: 4 mg via INTRAVENOUS
  Filled 2022-08-19: qty 2

## 2022-08-19 MED ORDER — APIXABAN (ELIQUIS) VTE STARTER PACK (10MG AND 5MG): ORAL_TABLET | ORAL | 0 refills | Status: DC

## 2022-08-19 MED ORDER — FAMOTIDINE IN NACL 20-0.9 MG/50ML-% IV SOLN
20.0000 mg | Freq: Once | INTRAVENOUS | Status: AC
  Administered 2022-08-19: 20 mg via INTRAVENOUS
  Filled 2022-08-19: qty 50

## 2022-08-19 MED ORDER — SODIUM CHLORIDE (PF) 0.9 % IJ SOLN
INTRAMUSCULAR | Status: AC
  Filled 2022-08-19: qty 50

## 2022-08-19 MED ORDER — APIXABAN 5 MG PO TABS
10.0000 mg | ORAL_TABLET | Freq: Two times a day (BID) | ORAL | Status: DC
  Administered 2022-08-19: 10 mg via ORAL
  Filled 2022-08-19 (×2): qty 2

## 2022-08-19 NOTE — ED Notes (Signed)
ED Provider at bedside. 

## 2022-08-19 NOTE — ED Triage Notes (Signed)
1 month of n/v, low po intake. Attempted to go to work today but unable to d/t increase bloating today. Denies any abd pain.

## 2022-08-19 NOTE — ED Notes (Signed)
Lab called to add on trop level

## 2022-08-19 NOTE — Telephone Encounter (Signed)
scheduled per 4/15 referral , pt has been called and confirmed date and time. Pt is aware of location and to arrive early for check in

## 2022-08-19 NOTE — ED Provider Notes (Signed)
[place] EMERGENCY DEPARTMENT AT [facility] Provider Note   CSN: [id number] Arrival date & time: 08/19/22  6381     History  Chief Complaint  Patient presents with   Nausea    Mathew [name] is a 72 y.o. male presented to the ED complaining of early satiety, nausea, abdominal bloating.  He reports his symptoms been ongoing for several weeks, but the abdominal bloating worsened over the past 24 hours.  He reports he does feel nauseated.  Denies chest pain or pressure.  Denies any known history of acid reflux, and does not take reflux medications.  He clarifies that he has not had abdominal tension that is acute, but likely has been "putting on weight for a few years".  He does feel fatigued today.  He is here with his wife.  Patient has a history of pulmonary fibrosis and says he quit smoking 1 week ago.  HPI     Home Medications Prior to Admission medications   Medication Sig Start Date End Date Taking? Authorizing Provider  APIXABAN (ELIQUIS) VTE STARTER PACK (10MG  AND 5MG ) Take as directed on package: start with two-5mg  tablets twice daily for 7 days. On day 8, switch to one-5mg  tablet twice daily. 08/20/22  Yes Terald Sleeper, MD  amLODipine (NORVASC) 5 MG tablet Take 1 tablet (5 mg total) by mouth daily. Please make overdue appt with Dr. Johney Frame before anymore refills. 3rd and Final Attempt 01/15/19   Allred, Fayrene Fearing, MD  escitalopram (LEXAPRO) 10 MG tablet Take 10 mg by mouth daily. 05/27/21   [provider]  loperamide (IMODIUM A-D) 2 MG tablet Take 2 tablets (4 mg total) by mouth 4 (four) times daily as needed for diarrhea or loose stools. 10/16/21   Cobb, Ruby Cola, NP  losartan (COZAAR) 100 MG tablet Take 1 tablet (100 mg total) by mouth daily. Please make overdue appt with Dr. Johney Frame before anymore refills. 3rd and Final Attempt 01/15/19   Hillis Range, MD  metoprolol succinate (TOPROL-XL) 100 MG 24 hr tablet Take 1 tablet (100 mg total) by mouth daily. NEED  TO SCHEDULE APPOINTMENT 11/17/18   Allred, Fayrene Fearing, MD  Multiple Vitamin (MULTIVITAMIN WITH MINERALS) TABS tablet Take 1 tablet by mouth daily.    [provider]  Pirfenidone 267 MG TABS Month 1: Take 1 tablet three times daily for 2 weeks then increase to 2 tablets three times daily thereafter. **STAY ON LOW DOSE** 07/27/21   Kalman Shan, MD  Pirfenidone 267 MG TABS Take 2 tablets (534 mg total) by mouth 3 (three) times daily with meals. Month 2 onwards 07/27/21   Kalman Shan, MD  pravastatin (PRAVACHOL) 20 MG tablet Take 20 mg by mouth daily. 05/02/21   [provider]  valACYclovir (VALTREX) 500 MG tablet Take 1 tablet by mouth daily as needed. For cold sore flare ups 03/09/16   [provider]      Allergies    Patient has no known allergies.    Review of Systems   Review of Systems  Physical Exam Updated Vital Signs BP (!) 144/102   Pulse 69   Temp 98.5 F (36.9 C) (Oral)   Resp 13   Ht 6\' 3"  (1.905 m)   Wt 95.7 kg   SpO2 92%   BMI 26.37 kg/m  Physical Exam Constitutional:      General: He is not in acute distress. HENT:     Head: Normocephalic and atraumatic.  Eyes:     Conjunctiva/sclera: Conjunctivae normal.  Pupils: Pupils are equal, round, and reactive to light.  Cardiovascular:     Rate and Rhythm: Normal rate and regular rhythm.  Pulmonary:     Effort: Pulmonary effort is normal. No respiratory distress.  Abdominal:     General: There is no distension.     Tenderness: There is no abdominal tenderness. There is no guarding.     Comments: Fullness of RUQ  Skin:    General: Skin is warm and dry.  Neurological:     General: No focal deficit present.     Mental Status: He is alert. Mental status is at baseline.  Psychiatric:        Mood and Affect: Mood normal.        Behavior: Behavior normal.     ED Results / Procedures / Treatments   Labs (all labs ordered are listed, but only abnormal results are displayed) Labs  Reviewed  COMPREHENSIVE METABOLIC PANEL - Abnormal; Notable for the following components:      Result Value   Sodium 134 (*)    CO2 21 (*)    Glucose, Bld 173 (*)    AST 53 (*)    Total Bilirubin 1.4 (*)    All other components within normal limits  CBC - Abnormal; Notable for the following components:   WBC 10.8 (*)    All other components within normal limits  URINALYSIS, ROUTINE W REFLEX MICROSCOPIC - Abnormal; Notable for the following components:   Hgb urine dipstick SMALL (*)    Protein, ur 30 (*)    Bacteria, UA RARE (*)    All other components within normal limits  LIPASE, BLOOD  TROPONIN I (HIGH SENSITIVITY)    EKG EKG Interpretation  Date/Time:  Monday August 19 2022 10:36:25 EDT Ventricular Rate:  63 PR Interval:  186 QRS Duration: 85 QT Interval:  429 QTC Calculation: 440 R Axis:   12 Text Interpretation: Sinus rhythm - arm lead reversal now corrected Atrial premature complex Probable left atrial enlargement No sig change from earlier ecg Confirmed by Alvester Chou [phone]) on 08/19/2022 10:44:15 AM  Radiology CT ABDOMEN PELVIS W CONTRAST  Result Date: 08/19/2022 CLINICAL DATA:  Nausea vomiting.  Pain EXAM: CT ABDOMEN AND PELVIS WITH CONTRAST TECHNIQUE: Multidetector CT imaging of the abdomen and pelvis was performed using the standard protocol following bolus administration of intravenous contrast. RADIATION DOSE REDUCTION: This exam was performed according to the departmental dose-optimization program which includes automated exposure control, adjustment of the mA and/or kV according to patient size and/or use of iterative reconstruction technique. CONTRAST:  OMNIPAQUE IOHEXOL 300 MG/ML  SOLN COMPARISON:  CT 2014 April.  Ultrasound September 2019 and older FINDINGS: Lower chest: There is evidence of scarring and fibrosis along the lung bases with some bronchiectasis and honeycombing. Please correlate with clinical history. No pleural effusion. Hepatobiliary:  Large aggressive appearing heterogeneous mass identified in the right hepatic lobe involving segments 8, 7 and 6 predominantly measuring 12 x 12.6 cm on series 2, image 21. There appears to be some thrombosis of the posterior branch of the right portal vein as well. Finding is consistent with malignancy either primary or metastatic. Recommend further workup. There is a second smaller lesion more caudal best seen on delay, series 6, image 34 measuring 20 mm. This could be a separate lesion or a satellite. Gallbladder is nondilated. Pancreas: Unremarkable. No pancreatic ductal dilatation or surrounding inflammatory changes. Spleen: Previous splenectomy with some residual splenic tissue, splenules in the surgical bed, unchanged  from previous. Few varices are identified. Adrenals/Urinary Tract: Mild thickening of the adrenal glands stable. Along the midportion of the right kidney laterally is a heterogeneous macrolobular lesion measuring 2.2 by 2.0 cm on series 2, image 35. This has Hounsfield unit of 59 on portal venous phase and 60 on delayed. With appearance of subtle neoplasm is not excluded recommend further workup. There is a differential. There is a similar mildly complex appearing lesion along the lower pole left kidney measuring 10 by 10 mm on series 2, image 40. No collecting system dilatation. The ureters have normal course and caliber extending down to the urinary bladder. Stomach/Bowel: Mild diffuse colonic stool. No bowel dilatation or obstruction. Stomach and small bowel specifically are nondilated. The appendix is normal extending inferior to the cecum in the right hemipelvis. Vascular/Lymphatic: Normal caliber aorta and IVC with scattered vascular calcifications. There are several prominent upper abdominal lymph nodes. Example in the porta hepatis on series 2, image 32 measures 2.1 by 0.9 cm. Another example on series 2, image 25 measures 17 x 9 mm. Reproductive: Prostate is unremarkable. Other: No free  air or free fluid. Small fat containing umbilical hernia and inguinal hernias. Musculoskeletal: Degenerative changes noted along the spine particularly at L5-S1. IMPRESSION: Large aggressive heterogeneous mass involving the right hepatic lobe with washout consistent with a neoplasm either primary or metastatic. There is second smaller lesion caudal to this is segment 6. 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 Few borderline upper abdominal lymph nodes. Bilateral complex renal lesions. These are indeterminate. Please correlate with a more recent prior evaluation of the kidneys assess stability or additional workup with MRI when clinically appropriate. Fibrotic changes seen of the lung bases. Please correlate prior chest CT of 06/22/2021. Critical Value/emergent results were called by telephone at the time of interpretation on 08/19/2022 at 9:18 am to provider [facility] , who verbally acknowledged these results. Electronically Signed   By: Karen Kays M.D.   On: 08/19/2022 12:35    Procedures Procedures    Medications Ordered in ED Medications  apixaban (ELIQUIS) tablet 10 mg (has no administration in time range)    Followed by  apixaban (ELIQUIS) tablet 5 mg (has no administration in time range)  ondansetron (ZOFRAN) injection 4 mg (4 mg Intravenous Given 08/19/22 1022)  famotidine (PEPCID) IVPB 20 mg premix (20 mg Intravenous New Bag/Given 08/19/22 1022)  iohexol (OMNIPAQUE) 300 MG/ML solution 100 mL (100 mLs Intravenous Contrast Given 08/19/22 1145)    ED Course/ Medical Decision Making/ A&P Clinical Course as of 08/19/22 1354  Mon Aug 19, 2022  1337 I spoke to Dr Leonides Schanz from oncology regarding the patient's CT imaging.  The patient to follow-up with oncology as an outpatient for further workup for his malignancy.  But the oncologist did recommend initiating anticoagulation with Eliquis for the portal vein thrombosis, this was discussed with the patient and his  wife are in agreement with this plan.  At this time he is stable for outpatient follow-up.  No immediate or emergent indication for hospitalization.  All questions were answered. [MT]    Clinical Course User Index [MT] Bo Rogue, Kermit Balo, MD                             Medical Decision Making Amount and/or Complexity of Data Reviewed Labs: ordered. Radiology: ordered.  Risk Prescription drug management.   This patient presents to the ED with concern  for abdominal discomfort, nausea, early satiety. This involves an extensive number of treatment options, and is a complaint that carries with it a high risk of complications and morbidity.  The differential diagnosis includes undiagnosed malignancy versus bowel obstruction versus ileus versus gastritis versus atypical ACS versus other  His presentation could be consistent with gastritis type issues as primarily seems to be having epigastric discomfort and nausea that sometimes is worse at night.  He says he does have a gastroenterology appointment coming up in the future.  Co-morbidities that complicate the patient evaluation: Longstanding history of smoking at high risk of malignancy  Additional history obtained from patient's wife at bedside  External records from outside source obtained and reviewed including the CT imaging of the lungs performed in February 2023 showing moderate pulmonary fibrosis pattern, emphysematous changes  I ordered and personally interpreted labs.  The pertinent results include: No acute emergent findings  I ordered imaging studies including CT abdomen pelvis with contrast I independently visualized and interpreted imaging which showed large intrahepatic mass concerning for malignancy, lesions also noted on the kidneys. I agree with the radiologist interpretation  The patient was maintained on a cardiac monitor.  I personally viewed and interpreted the cardiac monitored which showed an underlying rhythm of:  Normal sinus rhythm  Per my interpretation the patient's ECG shows normal sinus rhythm with no ST elevations, nonspecific T wave abnormalities in lead V2, initial EKG with arm lead reversal that subsequently was corrected on repeat EKG  I ordered medication including IV Zofran and Pepcid for nausea and possible gastritis  I have reviewed the patients home medicines and have made adjustments as needed  After the interventions noted above, I reevaluated the patient and found that they have: stayed the same  Social Determinants of Health: I commended the patient on smoking cessation a week ago and encouraged him to continue with efforts to stop smoking  Dispostion:  After consideration of the diagnostic results and the patients response to treatment, I feel that the patent would benefit from close outpatient follow-up; ambulatory referral placed to oncology         Final Clinical Impression(s) / ED Diagnoses Final diagnoses:  Liver mass  Portal vein thrombosis    Rx / DC Orders ED Discharge Orders          Ordered    APIXABAN (ELIQUIS) VTE STARTER PACK (  AND )        08/19/22 1353    Ambulatory referral to Hematology / Oncology        08/19/22 1352              Terald Sleeper, MD 08/19/22 1355

## 2022-08-19 NOTE — Discharge Instructions (Addendum)
You are diagnosed with a 12 cm mass inside your liver today on CT scan.  This finding could be concerning for a cancer.  Therefore I have referred you to see an oncologist who is a cancer specialist for further follow-up in the office.  If you do not hear from their scheduling office within 3 business days, please contact the number above to request a follow-up appointment with the on-call provider.  In the meantime, I started you on a blood thinner called Eliquis.  This is because your CT scan showed that you have a clot inside one of the veins of your liver.  This medicine is to help prevent further clots from developing.  You will begin taking this medicine tomorrow morning as directed.  If you begin noticing bloody urine, blood in your stools, or feel lightheaded or dizzy, stop taking this medication until you see your oncologist.

## 2022-08-20 NOTE — Progress Notes (Unsigned)
[facility]   Telephone:([phone] Fax:([phone]) [phone]   Clinic New Consult Note   Patient Care Team: Roderick Pee, [place] (Inactive) as PCP - General (Family Medicine)  Date of Service:  08/21/2022   CHIEF COMPLAINTS/PURPOSE OF CONSULTATION:  Liver Mass  REFERRING PHYSICIAN:  Terald Sleeper, MD  ASSESSMENT & PLAN:  Mathew [name] is a 72 y.o.  male with a history of   Liver mass -pt presented to ED earlier this week with abdominal bloating, nausea, early satiety for several weeks.  CT abdomen pelvis with contrast was performed, which showed a large 12.6 cm inheterogeneous mass involving the right hepatic lobe with washout consistent with a neoplasm either primary or metastatic. There is second smaller lesion 2cm caudal to this is segment 6.  CT also showed several enlarged Abdominal adenopathy.  -I personally reviewed the CT scan images with patient and discussed the findings, this is highly concerning for malignancy. -I recommended PET scan for further staging, and a liver biopsy.  He had CT chest for pulmonary fibrosis 2 months ago, which was negative for malignancy. -We discussed the possible primary tumor, including cholangiocarcinoma, HCC, or metastatic cancer to liver.  Briefly discussed surgery and chemotherapy.  Will finalize his treatment plan after we complete the workup. -Will check her tumor markers today, including AFP, CA 19.9, CEA and PSA.  Patient has a remote history of prostate cancer -Patient did have history of treated hepatitis C, no gross liver cirrhosis on the CT scan.  If his AFP is significantly elevated, I will recommend liver MRI with and without contrast to see if we can diagnose HCC without liver biopsy. 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  We discussed the pros and cons of anticoagulation, given the uncertain benefit of anticoagulation in cancer related portal vein thrombosis, and pending liver biopsy, I will hold on Eliquis for now which was prescribed by ED physician but he has not filled it yet.   PLAN:  -discuss CT Scan findings  -Recommend Liver biopsy for diagnosis  - I recommend PET scan -I recommend against Eliquis -lab work -tumor Marker today -f/u after PET and liver biopsy  -tumor board discussion     HISTORY OF PRESENTING ILLNESS:  Mathew [name] 72 y.o. male is a here because of Liver Mass. The patient was referred by Terald Sleeper, MD. The patient presents to the clinic today accompanied by wife.  He presented to the emergency room 2 days ago with 2 months history of early satiety, recurrent nausea, vomiting, decreased oral intake and a 9 pound weight loss.  He underwent a CT abdomen pelvis with contrast in the ED, which showed a 12.6 cm mass in the right lobe of liver, along with a smaller 2 cm lesion next to the large tumor.  No other primary or metastatic lesions seen on the CT.  CMP was unremarkable except mildly elevated AST 53, and total bilirubin 1.4.  CBC was normal except mildly elevated WBC 10.8 with elevated absolute lymphocyte count 4.5, monocytes and eosinophils.  He was referred by emergency room physician to [place] for further workup.  He had a remote history of prostate cancer, status post total prostatectomy in 2006, and adjuvant radiation.  He has been seeing urologist since  then, his last PSA was normal a year ago.  No other history of malignancy.  He has a history of pulmonary fibrosis, sees Dr. Marchelle Gearing, not on treatment.  He had a CT chest in February 2024 which was negative for malignancy.   Pt denied having pain he just feels full and bloated in abdomen.  Pt states that his BM varies. Pt feels congested a lot when he was smoking. Pt state that he has some SOB when he is  mobile.   He has a PMHx of....  -Prostate Cancer -Hypertension -Hepatitis C, treated  -Brother -Colon Cancer, Prostate Cancer   Socially... Married 3 children  Alcohol- occasionally Smoker- stopping smoking 2 wks ago    REVIEW OF SYSTEMS:    Constitutional: Denies fevers, chills or abnormal night sweats, (+) fatigue  Eyes: Denies blurriness of vision, double vision or watery eyes Ears, nose, mouth, throat, and face: Denies mucositis or sore throat Respiratory: Denies cough, dyspnea or wheezes, (+) dyspnea on exertion Cardiovascular: Denies palpitation, chest discomfort or lower extremity swelling Gastrointestinal:  see HPI  Skin: Denies abnormal skin rashes Lymphatics: Denies new lymphadenopathy or easy bruising Neurological:Denies numbness, tingling or new weaknesses Behavioral/Psych: Mood is stable, no new changes ,Anxiety All other systems were reviewed with the patient and are negative.   MEDICAL HISTORY:  Past Medical History:  Diagnosis Date   Hepatitis C    s/p treatment   Hypertension    Obstructive sleep apnea    unable to tolerate CPAP   Premature ventricular contraction    Prostate cancer 2006    SURGICAL HISTORY: Past Surgical History:  Procedure Laterality Date   LOOP RECORDER INSERTION N/A 07/04/2016   Procedure: Loop Recorder Insertion;  Surgeon: Hillis Range, MD;  Location: [facility];  Service: Cardiovascular;  Laterality: N/A;   prostectomy     SPLENECTOMY, TOTAL      SOCIAL HISTORY: Social History   Socioeconomic History   Marital status: Married    Spouse name: Not on file   Number of children: 3   Years of education: Not on file   Highest education level: Not on file  Occupational History   Not on file  Tobacco Use   Smoking status: Former    Packs/day: 1.00    Years: 40.00    Additional pack years: 0.00    Total pack years: 40.00    Types: Cigarettes    Quit date: 08/05/2022    Years since quitting: 0.0    Passive  exposure: Past   Smokeless tobacco: Never   Tobacco comments:    Smokes 1ppd. May not smoke them all, may throw some of them out.  Never smokes a whole one.  Vaping Use   Vaping Use: Never used  Substance and Sexual Activity   Alcohol use: Yes    Alcohol/week: 2.0 standard drinks of alcohol    Types: 2 Cans of beer per week    Comment: weekends   Drug use: No   Sexual activity: Not on file  Other Topics Concern   Not on file  Social History Narrative   Pt lives in [place] with spouse.  3 children.  Works as a Naval architect.   Social Determinants of Health   Financial Resource Strain: Not on file  Food Insecurity: Not on file  Transportation Needs: Not on file  Physical Activity: Not on file  Stress: Not on file  Social Connections: Not on file  Intimate Partner Violence: Not on file  FAMILY HISTORY: Family History  Problem Relation Age of Onset   Cancer Brother        colon cancer and prostate cancer   Heart attack Neg Hx     ALLERGIES:  has No Known Allergies.  MEDICATIONS:  Current Outpatient Medications  Medication Sig Dispense Refill   amLODipine (NORVASC) 5 MG tablet Take 1 tablet (5 mg total) by mouth daily. Please make overdue appt with Dr. Johney Frame before anymore refills. 3rd and Final Attempt 15 tablet 0   APIXABAN (ELIQUIS) VTE STARTER PACK (10MG  AND 5MG ) Take as directed on package: start with two-5mg  tablets twice daily for 7 days. On day 8, switch to one-5mg  tablet twice daily. 1 each 0   escitalopram (LEXAPRO) 10 MG tablet Take 10 mg by mouth daily.     losartan (COZAAR) 100 MG tablet Take 1 tablet (100 mg total) by mouth daily. Please make overdue appt with Dr. Johney Frame before anymore refills. 3rd and Final Attempt 15 tablet 0   metoprolol succinate (TOPROL-XL) 100 MG 24 hr tablet Take 1 tablet (100 mg total) by mouth daily. NEED TO SCHEDULE APPOINTMENT 20 tablet 0   Multiple Vitamin (MULTIVITAMIN WITH MINERALS) TABS tablet Take 1 tablet by mouth daily.      pravastatin (PRAVACHOL) 20 MG tablet Take 20 mg by mouth daily.     valACYclovir (VALTREX) 500 MG tablet Take 1 tablet by mouth daily as needed. For cold sore flare ups     No current facility-administered medications for this visit.    PHYSICAL EXAMINATION: ECOG PERFORMANCE STATUS: 1 - Symptomatic but completely ambulatory  Vitals:   08/21/22 1500  BP: (!) 154/99  Pulse: 91  Resp: 18  Temp: 98.3 F (36.8 C)  SpO2: 98%   Filed Weights   08/21/22 1500  Weight: 207 lb 8 oz (94.1 kg)    GENERAL:alert, no distress and comfortable SKIN: skin color, texture, turgor are normal, no rashes or significant lesions EYES: normal, Conjunctiva are pink and non-injected, sclera clear NECK: (-) supple, thyroid normal size, non-tender, without nodularity LYMPH: (-) no palpable lymphadenopathy in the cervical, axillary  LUNGS: (-) clear to auscultation and percussion with normal breathing effort HEART: (-) regular rate & rhythm and no murmurs and (-) no lower extremity edema ABDOMEN:(-) abdomen soft, (-) non-tender and normal bowel sounds    LABORATORY DATA:  I have reviewed the data as listed    Latest Ref Rng & Units 08/19/2022    9:41 AM 06/12/2018    8:15 AM 07/24/2016    8:57 PM  CBC  WBC 4.0 - 10.5 K/uL 10.8  13.3  9.6   Hemoglobin 13.0 - 17.0 g/dL 16.1  09.6  04.5   Hematocrit 39.0 - 52.0 % 50.4  46.7  45.5   Platelets 150 - 400 K/uL 205  205  174        Latest Ref Rng & Units 08/19/2022    9:41 AM 10/16/2021   12:27 PM 06/12/2018    8:15 AM  CMP  Glucose 70 - 99 mg/dL 409  811  93   BUN 8 - 23 mg/dL 16  11  10    Creatinine 0.61 - 1.24 mg/dL 9.14  7.82  9.56   Sodium 135 - 145 mmol/L 134  134  133   Potassium 3.5 - 5.1 mmol/L 3.7  3.6  3.5   Chloride 98 - 111 mmol/L 98  102  103   CO2 22 - 32 mmol/L 21  25  24  Calcium 8.9 - 10.3 mg/dL 9.2  9.6  9.1   Total Protein 6.5 - 8.1 g/dL 7.4  7.6  7.9   Total Bilirubin 0.3 - 1.2 mg/dL 1.4  0.6  0.8   Alkaline Phos 38 - 126 U/L  84  77  60   AST 15 - 41 U/L 53  25  20   ALT 0 - 44 U/L 24  35  22      RADIOGRAPHIC STUDIES: I have personally reviewed the radiological images as listed and agreed with the findings in the report. CT ABDOMEN PELVIS W CONTRAST  Result Date: 08/19/2022 CLINICAL DATA:  Nausea vomiting.  Pain EXAM: CT ABDOMEN AND PELVIS WITH CONTRAST TECHNIQUE: Multidetector CT imaging of the abdomen and pelvis was performed using the standard protocol following bolus administration of intravenous contrast. RADIATION DOSE REDUCTION: This exam was performed according to the departmental dose-optimization program which includes automated exposure control, adjustment of the mA and/or kV according to patient size and/or use of iterative reconstruction technique. CONTRAST:  OMNIPAQUE IOHEXOL 300 MG/ML  SOLN COMPARISON:  CT 2014 April.  Ultrasound September 2019 and older FINDINGS: Lower chest: There is evidence of scarring and fibrosis along the lung bases with some bronchiectasis and honeycombing. Please correlate with clinical history. No pleural effusion. Hepatobiliary: Large aggressive appearing heterogeneous mass identified in the right hepatic lobe involving segments 8, 7 and 6 predominantly measuring 12 x 12.6 cm on series 2, image 21. There appears to be some thrombosis of the posterior branch of the right portal vein as well. Finding is consistent with malignancy either primary or metastatic. Recommend further workup. There is a second smaller lesion more caudal best seen on delay, series 6, image 34 measuring 20 mm. This could be a separate lesion or a satellite. Gallbladder is nondilated. Pancreas: Unremarkable. No pancreatic ductal dilatation or surrounding inflammatory changes. Spleen: Previous splenectomy with some residual splenic tissue, splenules in the surgical bed, unchanged from previous. Few varices are identified. Adrenals/Urinary Tract: Mild thickening of the adrenal glands stable. Along the  midportion of the right kidney laterally is a heterogeneous macrolobular lesion measuring 2.2 by 2.0 cm on series 2, image 35. This has Hounsfield unit of 59 on portal venous phase and 60 on delayed. With appearance of subtle neoplasm is not excluded recommend further workup. There is a differential. There is a similar mildly complex appearing lesion along the lower pole left kidney measuring 10 by 10 mm on series 2, image 40. No collecting system dilatation. The ureters have normal course and caliber extending down to the urinary bladder. Stomach/Bowel: Mild diffuse colonic stool. No bowel dilatation or obstruction. Stomach and small bowel specifically are nondilated. The appendix is normal extending inferior to the cecum in the right hemipelvis. Vascular/Lymphatic: Normal caliber aorta and IVC with scattered vascular calcifications. There are several prominent upper abdominal lymph nodes. Example in the porta hepatis on series 2, image 32 measures 2.1 by 0.9 cm. Another example on series 2, image 25 measures 17 x 9 mm. Reproductive: Prostate is unremarkable. Other: No free air or free fluid. Small fat containing umbilical hernia and inguinal hernias. Musculoskeletal: Degenerative changes noted along the spine particularly at L5-S1. IMPRESSION: Large aggressive heterogeneous mass involving the right hepatic lobe with washout consistent with a neoplasm either primary or metastatic. There is second smaller lesion caudal to this is segment 6. 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 Few borderline upper abdominal lymph nodes. Bilateral complex renal  lesions. These are indeterminate. Please correlate with a more recent prior evaluation of the kidneys assess stability or additional workup with MRI when clinically appropriate. Fibrotic changes seen of the lung bases. Please correlate prior chest CT of 06/22/2021. Critical Value/emergent results were called by telephone at the time of  interpretation on 08/19/2022 at 9:18 am to provider [facility] , who verbally acknowledged these results. Electronically Signed   By: Karen Kays M.D.   On: 08/19/2022 12:35     Orders Placed This Encounter  Procedures   IR US LIVER BIOPSY    Standing Status:   Future    Standing Expiration Date:   08/21/2023    Order Specific Question:   Reason for Exam (SYMPTOM  OR DIAGNOSIS REQUIRED)    Answer:   rule out or confirm malignancy    Order Specific Question:   Preferred Imaging Location?    Answer:   [facility]   NM PET Image Initial (PI) Skull Base To Thigh    Standing Status:   Future    Standing Expiration Date:   08/21/2023    Order Specific Question:   If indicated for the ordered procedure, I authorize the administration of a radiopharmaceutical per Radiology protocol    Answer:   Yes    Order Specific Question:   Preferred imaging location?    Answer:   [facility]   US BIOPSY (LIVER)    Standing Status:   Future    Standing Expiration Date:   08/21/2023    Order Specific Question:   Lab orders requested (DO NOT place separate lab orders, these will be automatically ordered during procedure specimen collection):    Answer:   Surgical Pathology    Order Specific Question:   Reason for Exam (SYMPTOM  OR DIAGNOSIS REQUIRED)    Answer:   confirm malignancy    Order Specific Question:   Preferred location?    Answer:   [facility]   CEA (IN HOUSE-[facility])    Standing Status:   Future    Number of Occurrences:   1    Standing Expiration Date:   08/21/2023   Protime-INR    Standing Status:   Future    Number of Occurrences:   1    Standing Expiration Date:   08/21/2023   AFP tumor marker    Standing Status:   Future    Number of Occurrences:   1    Standing Expiration Date:   08/21/2023   Cancer antigen 19-9    Standing Status:   Future    Number of Occurrences:   1    Standing Expiration Date:   08/21/2023    All questions were answered. The patient knows to  call the clinic with any problems, questions or concerns. The total time spent in the appointment was 45 minutes.     Malachy Mood, MD 08/21/2022 3:58 PM  I, Monica Martinez am acting as scribe for Malachy Mood, MD.   I have reviewed the above documentation for accuracy and completeness, and I agree with the above.

## 2022-08-21 ENCOUNTER — Other Ambulatory Visit: Payer: BC Managed Care – PPO

## 2022-08-21 ENCOUNTER — Other Ambulatory Visit: Payer: Self-pay

## 2022-08-21 ENCOUNTER — Inpatient Hospital Stay: Payer: BC Managed Care – PPO | Attending: Hematology | Admitting: Hematology

## 2022-08-21 ENCOUNTER — Inpatient Hospital Stay: Payer: BC Managed Care – PPO

## 2022-08-21 ENCOUNTER — Encounter: Payer: Self-pay | Admitting: Hematology

## 2022-08-21 VITALS — BP 154/99 | HR 91 | Temp 98.3°F | Resp 18 | Ht 75.5 in | Wt 207.5 lb

## 2022-08-21 DIAGNOSIS — R932 Abnormal findings on diagnostic imaging of liver and biliary tract: Secondary | ICD-10-CM | POA: Insufficient documentation

## 2022-08-21 DIAGNOSIS — R160 Hepatomegaly, not elsewhere classified: Secondary | ICD-10-CM

## 2022-08-21 DIAGNOSIS — R6881 Early satiety: Secondary | ICD-10-CM | POA: Insufficient documentation

## 2022-08-21 DIAGNOSIS — R590 Localized enlarged lymph nodes: Secondary | ICD-10-CM | POA: Diagnosis not present

## 2022-08-21 DIAGNOSIS — R978 Other abnormal tumor markers: Secondary | ICD-10-CM | POA: Diagnosis not present

## 2022-08-21 DIAGNOSIS — I81 Portal vein thrombosis: Secondary | ICD-10-CM | POA: Insufficient documentation

## 2022-08-21 DIAGNOSIS — Z8546 Personal history of malignant neoplasm of prostate: Secondary | ICD-10-CM | POA: Insufficient documentation

## 2022-08-21 LAB — PROTIME-INR
INR: 1 (ref 0.8–1.2)
Prothrombin Time: 13.4 seconds (ref 11.4–15.2)

## 2022-08-21 NOTE — Assessment & Plan Note (Signed)
-  pt presented to ED earlier this week with abdominal bloating, nausea, early satiety for several weeks.  CT abdomen pelvis with contrast was performed, which showed a large 12.6 cm inheterogeneous mass involving the right hepatic lobe with washout consistent with a neoplasm either primary or metastatic. There is second smaller lesion 2cm caudal to this is segment 6.  CT also showed several enlarged Abdominal adenopathy.  -I personally reviewed the CT scan images with patient and discussed the findings, this is highly concerning for malignancy. -I recommended PET scan for further staging, and a liver biopsy.

## 2022-08-22 ENCOUNTER — Telehealth: Payer: Self-pay | Admitting: Hematology

## 2022-08-22 LAB — CEA (IN HOUSE-CHCC): CEA (CHCC-In House): 79.37 ng/mL — ABNORMAL HIGH (ref 0.00–5.00)

## 2022-08-22 NOTE — Progress Notes (Signed)
Oley Balm, MD  Leodis Rains D PROCEDURE / BIOPSY REVIEW Date: 08/21/22  Requested Biopsy site: liver Reason for request: mass Imaging review: Best seen on CT 08/19/22 Im 19 Se 2  Decision: Approved Imaging modality to perform: Ultrasound Schedule with: Moderate Sedation Schedule for: Any VIR  Additional comments:   Please contact me with questions, concerns, or if issue pertaining to this request arise.  Dayne Oley Balm, MD [facility] Radiology

## 2022-08-22 NOTE — Telephone Encounter (Signed)
Contacted patient to scheduled appointments. Patient is aware of appointments that are scheduled.   

## 2022-08-23 LAB — CANCER ANTIGEN 19-9: CA 19-9: 25 U/mL (ref 0–35)

## 2022-08-28 ENCOUNTER — Other Ambulatory Visit: Payer: Self-pay | Admitting: Radiology

## 2022-08-28 DIAGNOSIS — R160 Hepatomegaly, not elsewhere classified: Secondary | ICD-10-CM

## 2022-08-28 LAB — AFP TUMOR MARKER

## 2022-08-30 ENCOUNTER — Other Ambulatory Visit: Payer: Self-pay

## 2022-08-30 ENCOUNTER — Ambulatory Visit (HOSPITAL_COMMUNITY)
Admission: RE | Admit: 2022-08-30 | Discharge: 2022-08-30 | Disposition: A | Discharge status: Home / Self Care | Payer: BC Managed Care – PPO | Source: Ambulatory Visit | Attending: Hematology | Admitting: Hematology

## 2022-08-30 ENCOUNTER — Encounter (HOSPITAL_COMMUNITY): Payer: Self-pay

## 2022-08-30 DIAGNOSIS — I10 Essential (primary) hypertension: Secondary | ICD-10-CM | POA: Insufficient documentation

## 2022-08-30 DIAGNOSIS — R160 Hepatomegaly, not elsewhere classified: Secondary | ICD-10-CM | POA: Insufficient documentation

## 2022-08-30 DIAGNOSIS — J8410 Pulmonary fibrosis, unspecified: Secondary | ICD-10-CM | POA: Insufficient documentation

## 2022-08-30 DIAGNOSIS — Z87891 Personal history of nicotine dependence: Secondary | ICD-10-CM | POA: Insufficient documentation

## 2022-08-30 DIAGNOSIS — Z8546 Personal history of malignant neoplasm of prostate: Secondary | ICD-10-CM | POA: Insufficient documentation

## 2022-08-30 DIAGNOSIS — Z8619 Personal history of other infectious and parasitic diseases: Secondary | ICD-10-CM | POA: Insufficient documentation

## 2022-08-30 DIAGNOSIS — G4733 Obstructive sleep apnea (adult) (pediatric): Secondary | ICD-10-CM | POA: Insufficient documentation

## 2022-08-30 DIAGNOSIS — C220 Liver cell carcinoma: Secondary | ICD-10-CM | POA: Diagnosis not present

## 2022-08-30 LAB — COMPREHENSIVE METABOLIC PANEL
ALT: 35 U/L (ref 0–44)
AST: 62 U/L — ABNORMAL HIGH (ref 15–41)
Albumin: 3.9 g/dL (ref 3.5–5.0)
Alkaline Phosphatase: 101 U/L (ref 38–126)
Anion gap: 11 (ref 5–15)
BUN: 15 mg/dL (ref 8–23)
CO2: 22 mmol/L (ref 22–32)
Calcium: 9.4 mg/dL (ref 8.9–10.3)
Chloride: 102 mmol/L (ref 98–111)
Creatinine, Ser: 1.01 mg/dL (ref 0.61–1.24)
GFR, Estimated: >60 mL/min (ref >60)
Glucose, Bld: 91 mg/dL (ref 70–99)
Potassium: 3.7 mmol/L (ref 3.5–5.1)
Sodium: 135 mmol/L (ref 135–145)
Total Bilirubin: 1.2 mg/dL (ref 0.3–1.2)
Total Protein: 7.9 g/dL (ref 6.5–8.1)

## 2022-08-30 LAB — CBC WITH DIFFERENTIAL/PLATELET
Abs Immature Granulocytes: 0.06 10*3/uL (ref 0.00–0.07)
Basophils Absolute: 0.2 10*3/uL — ABNORMAL HIGH (ref 0.0–0.1)
Basophils Relative: 2 %
Eosinophils Absolute: 0.3 10*3/uL (ref 0.0–0.5)
Eosinophils Relative: 3 %
HCT: 52.3 % — ABNORMAL HIGH (ref 39.0–52.0)
Hemoglobin: 17.3 g/dL — ABNORMAL HIGH (ref 13.0–17.0)
Immature Granulocytes: 1 %
Lymphocytes Relative: 28 %
Lymphs Abs: 2.7 10*3/uL (ref 0.7–4.0)
MCH: 28.8 pg (ref 26.0–34.0)
MCHC: 33.1 g/dL (ref 30.0–36.0)
MCV: 87.2 fL (ref 80.0–100.0)
Monocytes Absolute: 0.9 10*3/uL (ref 0.1–1.0)
Monocytes Relative: 9 %
Neutro Abs: 5.6 10*3/uL (ref 1.7–7.7)
Neutrophils Relative %: 57 %
Platelets: 261 10*3/uL (ref 150–400)
RBC: 6 MIL/uL — ABNORMAL HIGH (ref 4.22–5.81)
RDW: 15.9 % — ABNORMAL HIGH (ref 11.5–15.5)
WBC: 9.7 10*3/uL (ref 4.0–10.5)
nRBC: 0 % (ref 0.0–0.2)

## 2022-08-30 LAB — PROTIME-INR
INR: 1 (ref 0.8–1.2)
Prothrombin Time: 13.2 seconds (ref 11.4–15.2)

## 2022-08-30 MED ORDER — LIDOCAINE HCL 1 % IJ SOLN
INTRAMUSCULAR | Status: AC
  Filled 2022-08-30: qty 20

## 2022-08-30 MED ORDER — MIDAZOLAM HCL 2 MG/2ML IJ SOLN
INTRAMUSCULAR | Status: AC
  Filled 2022-08-30: qty 2

## 2022-08-30 MED ORDER — GELATIN ABSORBABLE 12-7 MM EX MISC
CUTANEOUS | Status: AC
  Filled 2022-08-30: qty 1

## 2022-08-30 MED ORDER — LIDOCAINE HCL (PF) 1 % IJ SOLN
INTRAMUSCULAR | Status: AC | PRN
  Administered 2022-08-30: 10 mL

## 2022-08-30 MED ORDER — FENTANYL CITRATE (PF) 100 MCG/2ML IJ SOLN
INTRAMUSCULAR | Status: AC
  Filled 2022-08-30: qty 2

## 2022-08-30 MED ORDER — MIDAZOLAM HCL 2 MG/2ML IJ SOLN
INTRAMUSCULAR | Status: AC | PRN
  Administered 2022-08-30 (×3): 1 mg via INTRAVENOUS

## 2022-08-30 MED ORDER — HYDROCODONE-ACETAMINOPHEN 5-325 MG PO TABS: 1.0000 | ORAL_TABLET | Freq: Once | ORAL | Status: DC

## 2022-08-30 MED ORDER — GELATIN ABSORBABLE 12-7 MM EX MISC
CUTANEOUS | Status: AC | PRN
  Administered 2022-08-30: 1

## 2022-08-30 MED ORDER — SODIUM CHLORIDE 0.9 % IV SOLN: INTRAVENOUS | Status: DC

## 2022-08-30 MED ORDER — FENTANYL CITRATE (PF) 100 MCG/2ML IJ SOLN
INTRAMUSCULAR | Status: AC | PRN
  Administered 2022-08-30 (×3): 50 ug via INTRAVENOUS

## 2022-08-30 NOTE — Procedures (Signed)
Interventional Radiology Procedure:   Indications: Liver mass  Procedure: US guided liver mass biopsy  Findings: 3 core biopsies from large right hepatic mass.  Gelfoam slurry injected after biopsy.   Complications: None     EBL: Minimal  Plan: Bedrest 3 hours, then discharge to home.  If patient is going to start Eliquis, recommend waiting until 09/01/22.  Ziva Nunziata R. Lowella Dandy, MD  Pager: [phone]

## 2022-08-30 NOTE — H&P (Addendum)
Referring Physician(s): Feng,Yan  Supervising Physician: Richarda Overlie  Patient Status:  [facility] OP  Chief Complaint:  "I'm having a liver biopsy"  Subjective: Pt known to IR team from random liver biopsy in 2001 (path revealed benign lung tissue, no liver parenchyma). He has a PMH sig for hepatitis C, HTN, OSA, remote prostate cancer with prostatectomy, pulmonary fibrosis. He has a recent hx of abdominal bloating, nausea , early satiety and recent CT abd/pelvis revealing:   Large aggressive heterogeneous mass involving the right hepatic lobe with washout consistent with a neoplasm either primary or metastatic. There is second smaller lesion caudal to this is segment 6. 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   Few borderline upper abdominal lymph nodes.   Bilateral complex renal lesions. These are indeterminate. Please correlate with a more recent prior evaluation of the kidneys assess stability or additional workup with MRI when clinically appropriate.   Fibrotic changes seen of the lung bases.   AFP is > 102k. He is scheduled today for image guided liver mass biopsy for further evaluation. He denies fever,HA,CP,dyspnea, cough, vomiting or bleeding; He does have intermittent rt flank discomfort with rad down RLE.   Past Medical History:  Diagnosis Date   Hepatitis C    s/p treatment   Hypertension    Obstructive sleep apnea    unable to tolerate CPAP   Premature ventricular contraction    Prostate cancer (HCC) 2006   Past Surgical History:  Procedure Laterality Date   LOOP RECORDER INSERTION N/A 07/04/2016   Procedure: Loop Recorder Insertion;  Surgeon: Hillis Range, MD;  Location: [facility];  Service: Cardiovascular;  Laterality: N/A;   prostectomy     SPLENECTOMY, TOTAL       Allergies: Patient has no known allergies.  Medications: Prior to Admission medications   Medication Sig Start Date End Date Taking? Authorizing  Provider  amLODipine (NORVASC) 5 MG tablet Take 1 tablet (5 mg total) by mouth daily. Please make overdue appt with Dr. Johney Frame before anymore refills. 3rd and Final Attempt 01/15/19   Shuayb Schepers, Fayrene Fearing, MD  APIXABAN Everlene Balls) VTE STARTER PACK (10MG  AND 5MG ) Take as directed on package: start with two-5mg  tablets twice daily for 7 days. On day 8, switch to one-5mg  tablet twice daily. 08/20/22   Terald Sleeper, MD  escitalopram (LEXAPRO) 10 MG tablet Take 10 mg by mouth daily. 05/27/21   [provider]  losartan (COZAAR) 100 MG tablet Take 1 tablet (100 mg total) by mouth daily. Please make overdue appt with Dr. Johney Frame before anymore refills. 3rd and Final Attempt 01/15/19   Hillis Range, MD  metoprolol succinate (TOPROL-XL) 100 MG 24 hr tablet Take 1 tablet (100 mg total) by mouth daily. NEED TO SCHEDULE APPOINTMENT 11/17/18   Danyale Ridinger, Fayrene Fearing, MD  Multiple Vitamin (MULTIVITAMIN WITH MINERALS) TABS tablet Take 1 tablet by mouth daily.    [provider]  pravastatin (PRAVACHOL) 20 MG tablet Take 20 mg by mouth daily. 05/02/21   [provider]  valACYclovir (VALTREX) 500 MG tablet Take 1 tablet by mouth daily as needed. For cold sore flare ups 03/09/16   [provider]     Vital Signs: Vitals:   08/30/22 1050  BP: (!) 162/108  Pulse: 73  Resp: 18  Temp: 98.5 F (36.9 C)  SpO2: 97%   Code Status: FULL CODE   Physical Exam: awake/alert; chest- few fine bibasilar crackles; heart- RRR; abd- soft,+BS,NT; no LE  edema  Imaging: No results found.  Labs:  CBC: Recent Labs    08/19/22 0941  WBC 10.8*  HGB 16.5  HCT 50.4  PLT 205    COAGS: Recent Labs    08/21/22 1547  INR 1.0    BMP: Recent Labs    10/16/21 1227 08/19/22 0941  NA 134* 134*  K 3.6 3.7  CL 102 98  CO2 25 21*  GLUCOSE 108* 173*  BUN 11 16  CALCIUM 9.6 9.2  CREATININE 0.78 0.96  GFRNONAA  --  >60    LIVER FUNCTION TESTS: Recent Labs    10/16/21 1227 08/19/22 0941   BILITOT 0.6 1.4*  AST 25 53*  ALT 35 24  ALKPHOS 77 84  PROT 7.6 7.4  ALBUMIN 4.2 3.5    Assessment and Plan: 72 yo male , ex smoker, with PMH sig for hepatitis C, HTN, OSA, remote prostate cancer with prostatectomy, pulmonary fibrosis, prior random liver bx 2001 (path revealed benign lung tissue, no liver parenchyma).He has a recent hx of abdominal bloating, nausea , early satiety and recent CT abd/pelvis revealing:   Large aggressive heterogeneous mass involving the right hepatic lobe with washout consistent with a neoplasm either primary or metastatic. There is second smaller lesion caudal to this is segment 6. 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   Few borderline upper abdominal lymph nodes.   Bilateral complex renal lesions. These are indeterminate. Please correlate with a more recent prior evaluation of the kidneys assess stability or additional workup with MRI when clinically appropriate.   Fibrotic changes seen of the lung bases.   AFP is > 102k. He is scheduled today for image guided liver mass biopsy for further evaluation.Risks and benefits of procedure was discussed with the patient including, but not limited to bleeding, infection, damage to adjacent structures or low yield requiring additional tests.  All of the questions were answered and there is agreement to proceed.  Consent signed and in chart.  LABS PENDING  Electronically Signed: D. Jeananne Rama, PA-C 08/30/2022, 10:55 AM   I spent a total of 25 Minutes at the the patient's bedside AND on the patient's hospital floor or unit, greater than 50% of which was counseling/coordinating care for image guided liver mass biopsy

## 2022-08-30 NOTE — Sedation Documentation (Signed)
Sample #1 obtained 

## 2022-08-30 NOTE — Sedation Documentation (Signed)
Needle removed gelfoam inserted in tract

## 2022-08-30 NOTE — Discharge Instructions (Addendum)
Please call Interventional Radiology clinic [phone] with any questions or concerns.  You may remove your dressing and shower tomorrow.  In regards to your Eliquis. You must speak with Dr. Mosetta Putt prior to starting this medication. If Dr. Latanya Maudlin office has any questions they can refer to Dr. Manon Hilding note from today's procedure.   Liver Biopsy, Care After After a liver biopsy, it is common to have these things in the area where the biopsy was done. You may: Have pain. Feel sore. Have bruising. You may also feel tired for a few days. Follow these instructions at home: Medicines Take over-the-counter and prescription medicines only as told by your doctor. If you were prescribed an antibiotic medicine, take it as told by your doctor. Do not stop taking the antibiotic, even if you start to feel better. Do not take medicines that may thin your blood. These medicines include aspirin and ibuprofen. Take them only if your doctor tells you to. If told, take steps to prevent problems with pooping (constipation). You may need to: Drink enough fluid to keep your pee (urine) pale yellow. Take medicines. You will be told what medicines to take. Eat foods that are high in fiber. These include beans, whole grains, and fresh fruits and vegetables. Limit foods that are high in fat and sugar. These include fried or sweet foods. Ask your doctor if you should avoid driving or using machines while you are taking your medicine. Caring for your incision Follow instructions from your doctor about how to take care of your cut from surgery (incisions). Make sure you: Wash your hands with soap and water for at least 20 seconds before and after you change your bandage. If you cannot use soap and water, use hand sanitizer. Change your bandage. Leavestitches or skin glue in place for at least two weeks. Leave tape strips alone unless you are told to take them off. You may trim the edges of the tape strips if they curl  up. Check your incision every day for signs of infection. Check for: Redness, swelling, or more pain. Fluid or blood. Warmth. Pus or a bad smell. Do not take baths, swim, or use a hot tub. Ask your doctor about taking showers or sponge baths. Activity Rest at home for 1-2 days, or as told by your doctor. Get up to take short walks every 1 to 2 hours. Ask for help if you feel weak or unsteady. Do not lift anything that is heavier than 10 lb (4.5 kg), or the limit that you are told. Do not play contact sports for 2 weeks after the procedure. Return to your normal activities as told by your doctor. Ask what activities are safe for you. General instructions Do not drink alcohol in the first week after the procedure. Plan to have a responsible adult care for you for the time you are told after you leave the hospital or clinic. This is important. It is up to you to get the results of your procedure. Ask how to get your results when they are ready. Keep all follow-up visits.   Contact a doctor if: You have more bleeding in your incision. Your incision swells, or is red and more painful. You have fluid that comes from your incision. You develop a rash. You have fever or chills. Get help right away if: You have swelling, bloating, or pain in your belly (abdomen). You get dizzy or faint. You vomit or you feel like vomiting. You have trouble breathing or feel short  of breath. You have chest pain. You have problems talking or seeing. You have trouble with your balance or moving your arms or legs. These symptoms may be an emergency. Get help right away. Call your local emergency services (911 in the [place] Do not wait to see if the symptoms will go away. Do not drive yourself to the hospital. Summary After the procedure, it is common to have pain, soreness, bruising, and tiredness. Your doctor will tell you how to take care of yourself at home. Change your bandage, take your medicines, and  limit your activities as told by your doctor. Call your doctor if you have symptoms of infection. Get help right away if your belly swells, your cut bleeds a lot, or you have trouble talking or breathing. This information is not intended to replace advice given to you by your health care provider. Make sure you discuss any questions you have with your healthcare provider. Document Revised: 03/06/2020 Document Reviewed: 03/06/2020 Elsevier Patient Education  2022 Elsevier Inc.    Moderate Conscious Sedation, Adult, Care After This sheet gives you information about how to care for yourself after your procedure. Your health care provider may also give you more specific instructions. If you have problems or questions, contact your health careprovider. What can I expect after the procedure? After the procedure, it is common to have: Sleepiness for several hours. Impaired judgment for several hours. Difficulty with balance. Vomiting if you eat too soon. Follow these instructions at home: For the time period you were told by your health care provider: Rest. Do not participate in activities where you could fall or become injured. Do not drive or use machinery. Do not drink alcohol. Do not take sleeping pills or medicines that cause drowsiness. Do not make important decisions or sign legal documents. Do not take care of children on your own. Eating and drinking  Follow the diet recommended by your health care provider. Drink enough fluid to keep your urine pale yellow. If you vomit: Drink water, juice, or soup when you can drink without vomiting. Make sure you have little or no nausea before eating solid foods.  General instructions Take over-the-counter and prescription medicines only as told by your health care provider. Have a responsible adult stay with you for the time you are told. It is important to have someone help care for you until you are awake and alert. Do not smoke. Keep  all follow-up visits as told by your health care provider. This is important. Contact a health care provider if: You are still sleepy or having trouble with balance after 24 hours. You feel light-headed. You keep feeling nauseous or you keep vomiting. You develop a rash. You have a fever. You have redness or swelling around the IV site. Get help right away if: You have trouble breathing. You have new-onset confusion at home. Summary After the procedure, it is common to feel sleepy, have impaired judgment, or feel nauseous if you eat too soon. Rest after you get home. Know the things you should not do after the procedure. Follow the diet recommended by your health care provider and drink enough fluid to keep your urine pale yellow. Get help right away if you have trouble breathing or new-onset confusion at home. This information is not intended to replace advice given to you by your health care provider. Make sure you discuss any questions you have with your healthcare provider. Document Revised: 08/20/2019 Document Reviewed: 03/18/2019 Elsevier Patient Education  2022 Elsevier  Inc.

## 2022-08-30 NOTE — Sedation Documentation (Signed)
Sample #2 obtained 

## 2022-08-30 NOTE — Sedation Documentation (Signed)
Sample #3 obtained 

## 2022-09-02 ENCOUNTER — Other Ambulatory Visit: Payer: Self-pay

## 2022-09-02 ENCOUNTER — Emergency Department (HOSPITAL_COMMUNITY)
Admission: EM | Admit: 2022-09-02 | Discharge: 2022-09-02 | Disposition: A | Discharge status: Home / Self Care | Payer: BC Managed Care – PPO | Attending: Emergency Medicine | Admitting: Emergency Medicine

## 2022-09-02 ENCOUNTER — Encounter (HOSPITAL_COMMUNITY): Payer: Self-pay

## 2022-09-02 ENCOUNTER — Inpatient Hospital Stay: Payer: BC Managed Care – PPO | Admitting: Hematology

## 2022-09-02 ENCOUNTER — Emergency Department (HOSPITAL_COMMUNITY): Payer: BC Managed Care – PPO

## 2022-09-02 ENCOUNTER — Telehealth: Payer: Self-pay

## 2022-09-02 DIAGNOSIS — Z79899 Other long term (current) drug therapy: Secondary | ICD-10-CM | POA: Diagnosis not present

## 2022-09-02 DIAGNOSIS — I10 Essential (primary) hypertension: Secondary | ICD-10-CM | POA: Insufficient documentation

## 2022-09-02 DIAGNOSIS — R112 Nausea with vomiting, unspecified: Secondary | ICD-10-CM | POA: Insufficient documentation

## 2022-09-02 DIAGNOSIS — Z7901 Long term (current) use of anticoagulants: Secondary | ICD-10-CM | POA: Insufficient documentation

## 2022-09-02 DIAGNOSIS — Z8546 Personal history of malignant neoplasm of prostate: Secondary | ICD-10-CM | POA: Insufficient documentation

## 2022-09-02 LAB — CBC WITH DIFFERENTIAL/PLATELET
Abs Immature Granulocytes: 0.04 10*3/uL (ref 0.00–0.07)
Basophils Absolute: 0.2 10*3/uL — ABNORMAL HIGH (ref 0.0–0.1)
Basophils Relative: 2 %
Eosinophils Absolute: 0.2 10*3/uL (ref 0.0–0.5)
Eosinophils Relative: 2 %
HCT: 54.5 % — ABNORMAL HIGH (ref 39.0–52.0)
Hemoglobin: 17.8 g/dL — ABNORMAL HIGH (ref 13.0–17.0)
Immature Granulocytes: 0 %
Lymphocytes Relative: 25 %
Lymphs Abs: 2.4 10*3/uL (ref 0.7–4.0)
MCH: 28.7 pg (ref 26.0–34.0)
MCHC: 32.7 g/dL (ref 30.0–36.0)
MCV: 87.8 fL (ref 80.0–100.0)
Monocytes Absolute: 0.8 10*3/uL (ref 0.1–1.0)
Monocytes Relative: 8 %
Neutro Abs: 6.1 10*3/uL (ref 1.7–7.7)
Neutrophils Relative %: 63 %
Platelets: 250 10*3/uL (ref 150–400)
RBC: 6.21 MIL/uL — ABNORMAL HIGH (ref 4.22–5.81)
RDW: 17.4 % — ABNORMAL HIGH (ref 11.5–15.5)
WBC: 9.7 10*3/uL (ref 4.0–10.5)
nRBC: 0.2 % (ref 0.0–0.2)

## 2022-09-02 LAB — COMPREHENSIVE METABOLIC PANEL
ALT: 31 U/L (ref 0–44)
AST: 59 U/L — ABNORMAL HIGH (ref 15–41)
Albumin: 3.5 g/dL (ref 3.5–5.0)
Alkaline Phosphatase: 94 U/L (ref 38–126)
Anion gap: 9 (ref 5–15)
BUN: 21 mg/dL (ref 8–23)
CO2: 22 mmol/L (ref 22–32)
Calcium: 9.4 mg/dL (ref 8.9–10.3)
Chloride: 102 mmol/L (ref 98–111)
Creatinine, Ser: 1.35 mg/dL — ABNORMAL HIGH (ref 0.61–1.24)
GFR, Estimated: 56 mL/min — ABNORMAL LOW (ref >60)
Glucose, Bld: 135 mg/dL — ABNORMAL HIGH (ref 70–99)
Potassium: 3.8 mmol/L (ref 3.5–5.1)
Sodium: 133 mmol/L — ABNORMAL LOW (ref 135–145)
Total Bilirubin: 1.1 mg/dL (ref 0.3–1.2)
Total Protein: 7.9 g/dL (ref 6.5–8.1)

## 2022-09-02 LAB — PROTIME-INR
INR: 1 (ref 0.8–1.2)
Prothrombin Time: 13.4 seconds (ref 11.4–15.2)

## 2022-09-02 MED ORDER — ONDANSETRON 4 MG PO TBDP: 4.0000 mg | ORAL_TABLET | Freq: Three times a day (TID) | ORAL | 0 refills | Status: DC | PRN

## 2022-09-02 MED ORDER — PROMETHAZINE HCL 25 MG PO TABS: 25.0000 mg | ORAL_TABLET | Freq: Three times a day (TID) | ORAL | 0 refills | Status: DC | PRN

## 2022-09-02 MED ORDER — SODIUM CHLORIDE 0.9 % IV BOLUS
1000.0000 mL | Freq: Once | INTRAVENOUS | Status: AC
  Administered 2022-09-02: 1000 mL via INTRAVENOUS

## 2022-09-02 MED ORDER — ONDANSETRON HCL 4 MG/2ML IJ SOLN
4.0000 mg | Freq: Once | INTRAMUSCULAR | Status: AC
  Administered 2022-09-02: 4 mg via INTRAVENOUS
  Filled 2022-09-02: qty 2

## 2022-09-02 NOTE — Telephone Encounter (Signed)
Patients wife called stating that her husband has been sick all weekend vomiting, nauseated, gagging, not eating weak and stomach swelled. She stated she was taking him to the [facility] to be treated. Made Dr. Mosetta Putt aware of the call and she agreed she will see patient in the hospital.

## 2022-09-02 NOTE — ED Provider Notes (Signed)
[facility] Provider Note   CSN: [id number] Arrival date & time: 09/02/22  6962     History  Chief Complaint  Patient presents with   Nausea    Mathew [name] is a 72 y.o. male.  HPI Patient presents with nausea.  Vomiting.  Has known liver mass with recent biopsy..  Elevated AFP and presumed liver cancer.  Patient states he is losing weight.  States he thinks he needs to come in the hospital for you days to get this sorted out.  States he is worried it could be something in his lungs or have bacteria in the blood.  No bleeding.  Still having bowel movements.  Did have recent liver biopsy and states abdomen is feeling well and abdomen is not swelling.   Past Medical History:  Diagnosis Date   Hepatitis C    s/p treatment   Hypertension    Obstructive sleep apnea    unable to tolerate CPAP   Premature ventricular contraction    Prostate cancer (HCC) 2006    Home Medications Prior to Admission medications   Medication Sig Start Date End Date Taking? Authorizing Provider  amLODipine (NORVASC) 5 MG tablet Take 1 tablet (5 mg total) by mouth daily. Please make overdue appt with Dr. Johney Frame before anymore refills. 3rd and Final Attempt 01/15/19   Allred, Fayrene Fearing, MD  APIXABAN Everlene Balls) VTE STARTER PACK (10MG  AND 5MG ) Take as directed on package: start with two-5mg  tablets twice daily for 7 days. On day 8, switch to one-5mg  tablet twice daily. 08/20/22   Terald Sleeper, MD  escitalopram (LEXAPRO) 10 MG tablet Take 10 mg by mouth daily. 05/27/21   [provider]  losartan (COZAAR) 100 MG tablet Take 1 tablet (100 mg total) by mouth daily. Please make overdue appt with Dr. Johney Frame before anymore refills. 3rd and Final Attempt 01/15/19   Hillis Range, MD  metoprolol succinate (TOPROL-XL) 100 MG 24 hr tablet Take 1 tablet (100 mg total) by mouth daily. NEED TO SCHEDULE APPOINTMENT 11/17/18   Allred, Fayrene Fearing, MD  Multiple Vitamin (MULTIVITAMIN  WITH MINERALS) TABS tablet Take 1 tablet by mouth daily.    [provider]  ondansetron (ZOFRAN-ODT) 4 MG disintegrating tablet Take 1 tablet (4 mg total) by mouth every 8 (eight) hours as needed for nausea or vomiting. 09/02/22   Benjiman Core, MD  pravastatin (PRAVACHOL) 20 MG tablet Take 20 mg by mouth daily. 05/02/21   [provider]  promethazine (PHENERGAN) 25 MG tablet Take 1 tablet (25 mg total) by mouth every 8 (eight) hours as needed for nausea. 09/02/22   Benjiman Core, MD  valACYclovir (VALTREX) 500 MG tablet Take 1 tablet by mouth daily as needed. For cold sore flare ups 03/09/16   [provider]      Allergies    Patient has no known allergies.    Review of Systems   Review of Systems  Physical Exam Updated Vital Signs BP (!) 162/106 (BP Location: Right Arm)   Pulse 71   Temp 98.3 F (36.8 C) (Oral)   Resp 16   Ht 6\' 3"  (1.905 m)   Wt 90.3 kg   SpO2 94%   BMI 24.87 kg/m  Physical Exam Vitals and nursing note reviewed.  Cardiovascular:     Rate and Rhythm: Regular rhythm.  Pulmonary:     Breath sounds: No wheezing or rhonchi.  Abdominal:     Tenderness: There is no abdominal tenderness.  Musculoskeletal:        General: No tenderness.     Cervical back: Neck supple.  Skin:    General: Skin is warm.  Neurological:     Mental Status: He is alert and oriented to person, place, and time.     ED Results / Procedures / Treatments   Labs (all labs ordered are listed, but only abnormal results are displayed) Labs Reviewed  CBC WITH DIFFERENTIAL/PLATELET - Abnormal; Notable for the following components:      Result Value   RBC 6.21 (*)    Hemoglobin 17.8 (*)    HCT 54.5 (*)    RDW 17.4 (*)    Basophils Absolute 0.2 (*)    All other components within normal limits  COMPREHENSIVE METABOLIC PANEL - Abnormal; Notable for the following components:   Sodium 133 (*)    Glucose, Bld 135 (*)    Creatinine, Ser 1.35 (*)    AST 59  (*)    GFR, Estimated 56 (*)    All other components within normal limits  PROTIME-INR    EKG None  Radiology DG Abdomen Acute W/Chest  Result Date: 09/02/2022 CLINICAL DATA:  Nausea, vomiting EXAM: DG ABDOMEN ACUTE WITH 1 VIEW CHEST COMPARISON:  06/12/2018 FINDINGS: Normal heart size, mediastinal contours, and pulmonary vascularity. Chronic interstitial lung disease changes again seen with increased atelectasis at RIGHT base. No additional infiltrate, pleural effusion, or pneumothorax. Nonobstructive bowel gas pattern. No bowel dilatation, bowel thickening, or pneumothorax. Osseous mineralization low normal. No urinary tract calcifications. IMPRESSION: Chronic interstitial lung disease changes again seen with increased atelectasis at RIGHT base. Normal bowel gas pattern. Electronically Signed   By: Ulyses Southward M.D.   On: 09/02/2022 11:20    Procedures Procedures    Medications Ordered in ED Medications  ondansetron (ZOFRAN) injection 4 mg (4 mg Intravenous Given 09/02/22 1018)  sodium chloride 0.9 % bolus 1,000 mL (0 mLs Intravenous Stopped 09/02/22 1142)  sodium chloride 0.9 % bolus 1,000 mL (0 mLs Intravenous Stopped 09/02/22 1416)    ED Course/ Medical Decision Making/ A&P                             Medical Decision Making Amount and/or Complexity of Data Reviewed Labs: ordered. Radiology: ordered.  Risk Prescription drug management.   Patient with nausea vomiting.  Decreased oral intake.  Has known liver mass presumed to be liver cancer.  Abdomen is not increasing in just tension.  Doubt bleed.  Has been dealing with nausea for a while now.  Has lost weight which I presume was both from cancer and decreased oral intake.  I reviewed oncology note.  Reviewed note from interventional radiology.    Patient does show mildly increased creatinine.  However feeling better after IV fluids.  Tolerating orals.  Is not on antiemetics at home.  Has been dealing with this for a while.   Do not think we need admission in the hospital at this time.  Has short-term follow-up with oncology.  Still pending biopsy results.  Appears stable for discharge home.        Final Clinical Impression(s) / ED Diagnoses Final diagnoses:  Nausea and vomiting, unspecified vomiting type    Rx / DC Orders ED Discharge Orders          Ordered    ondansetron (ZOFRAN-ODT) 4 MG disintegrating tablet  Every 8 hours PRN,   Status:  Discontinued  09/02/22 1356    promethazine (PHENERGAN) 25 MG tablet  Every 8 hours PRN,   Status:  Discontinued        09/02/22 1356    promethazine (PHENERGAN) 25 MG tablet  Every 8 hours PRN        09/02/22 1400    ondansetron (ZOFRAN-ODT) 4 MG disintegrating tablet  Every 8 hours PRN        09/02/22 1400              Benjiman Core, MD 09/02/22 1629

## 2022-09-02 NOTE — ED Triage Notes (Signed)
Pt arrives today c/o continued nausea.  Pt was supposed to see his dr for a follow up on his liver biopsy, but results were not back yet. Pt states that appt was rescheduled for Wednesday and he did not feel like he could wait.  Pt is also concerned that he might have bacteria in his blood- pt states he works for a [employer]- and that is what is causing him to feel unwell.

## 2022-09-02 NOTE — ED Notes (Signed)
Pt tolerated PO fluid challenge well  

## 2022-09-02 NOTE — Progress Notes (Signed)
Spoke with Vita Barley in [facility] Pathology Dept regarding biopsy results.  Vita Barley stated that the specimen just went out today and Dr. Joni Fears has not gotten the specimen yet but will hopefully have it sometime today so she can look at the specimen.  Vita Barley stated Dr. Joni Fears will also request stains; therefore, the pathology results will not be available until Tuesday or Wednesday of this week.  Notified Dr. Mosetta Putt.

## 2022-09-03 DIAGNOSIS — C220 Liver cell carcinoma: Secondary | ICD-10-CM | POA: Insufficient documentation

## 2022-09-03 LAB — SURGICAL PATHOLOGY

## 2022-09-03 NOTE — Assessment & Plan Note (Signed)
-  cT3N1M0 stage IVA -presented with abdominal bloating, nausea and diarrhea satiety.  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  He is not a candidate for liver transplant due to the large size of his tumor.  I doubt he is a candidate for upfront surgery -I recommend systemic treatment given the high tumor burden and high AFP -I recommend first-line therapy with eculizumab and bevacizumab.  Potential benefit and side effects discussed with patient, especially autoimmune related pneumonitis, colitis, skin rash, joint pain, thyroid and endocrine disorders, hypertension, small risk of hemorrhage and thrombosis, proteinuria, renal failure, etc,.  Patient voiced good understanding, and agrees to proceed. -I also discussed the alternative therapy such as Y90, given his large tumor burden in liver, and symptomatic disease.  Will discuss this with interventional radiology in the conference, we will probably consider close systemic therapy and liver targeted therapy together, for better disease control.

## 2022-09-03 NOTE — Assessment & Plan Note (Signed)
-  f/u with pulmonary  

## 2022-09-04 ENCOUNTER — Encounter: Payer: Self-pay | Admitting: Hematology

## 2022-09-04 ENCOUNTER — Inpatient Hospital Stay: Payer: BC Managed Care – PPO | Attending: Hematology | Admitting: Hematology

## 2022-09-04 ENCOUNTER — Other Ambulatory Visit: Payer: Self-pay

## 2022-09-04 VITALS — BP 148/99 | HR 95 | Temp 98.1°F | Resp 18 | Ht 75.0 in | Wt 205.1 lb

## 2022-09-04 DIAGNOSIS — C220 Liver cell carcinoma: Secondary | ICD-10-CM | POA: Insufficient documentation

## 2022-09-04 DIAGNOSIS — R059 Cough, unspecified: Secondary | ICD-10-CM | POA: Diagnosis not present

## 2022-09-04 DIAGNOSIS — R197 Diarrhea, unspecified: Secondary | ICD-10-CM | POA: Insufficient documentation

## 2022-09-04 DIAGNOSIS — R531 Weakness: Secondary | ICD-10-CM | POA: Diagnosis not present

## 2022-09-04 DIAGNOSIS — Z79620 Long term (current) use of immunosuppressive biologic: Secondary | ICD-10-CM | POA: Insufficient documentation

## 2022-09-04 DIAGNOSIS — Z5112 Encounter for antineoplastic immunotherapy: Secondary | ICD-10-CM | POA: Insufficient documentation

## 2022-09-04 DIAGNOSIS — J84112 Idiopathic pulmonary fibrosis: Secondary | ICD-10-CM | POA: Diagnosis not present

## 2022-09-04 DIAGNOSIS — D72829 Elevated white blood cell count, unspecified: Secondary | ICD-10-CM | POA: Diagnosis not present

## 2022-09-04 DIAGNOSIS — Z515 Encounter for palliative care: Secondary | ICD-10-CM | POA: Insufficient documentation

## 2022-09-04 MED ORDER — PANTOPRAZOLE SODIUM 40 MG PO TBEC: 40.0000 mg | DELAYED_RELEASE_TABLET | Freq: Every day | ORAL | 2 refills | Status: DC

## 2022-09-04 MED ORDER — PROCHLORPERAZINE MALEATE 10 MG PO TABS: 10.0000 mg | ORAL_TABLET | Freq: Four times a day (QID) | ORAL | 0 refills | Status: DC | PRN

## 2022-09-04 NOTE — Progress Notes (Signed)
START OFF PATHWAY REGIMEN - Other   [id number]:Atezolizumab 1,200 mg IV D1 + Bevacizumab 15 mg/kg IV D1 q21 Days:   A cycle is every 21 days:     Atezolizumab      Bevacizumab-xxxx   **Always confirm dose/schedule in your pharmacy ordering system**  Patient Characteristics: Intent of Therapy: Non-Curative / Palliative Intent, Discussed with Patient 

## 2022-09-04 NOTE — Progress Notes (Signed)
[facility]   Telephone:([phone] Fax:(336) [phone]   Clinic Follow up Note   Patient Care Team: Patient, No Pcp Per as PCP - General (General Practice)  Date of Service:  09/04/2022  CHIEF COMPLAINT: f/u of Hepatocellular carcinoma (HCC  CURRENT THERAPY:  LUNG Atezolizumab+ Bevacizumab Maintenance q21d  ASSESSMENT:  Mathew [name] is a 72 y.o. male with   Hepatocellular carcinoma (HCC) -cT3N1M0 stage IVA -presented with abdominal bloating, nausea and diarrhea satiety.  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  He is not a candidate for liver transplant due to the large size of his tumor.  I doubt he is a candidate for upfront surgery -I recommend systemic treatment given the high tumor burden and high AFP -I recommend first-line therapy with eculizumab and bevacizumab.  Potential benefit and side effects discussed with patient, especially autoimmune related pneumonitis, colitis, skin rash, joint pain, thyroid and endocrine disorders, hypertension, small risk of hemorrhage and thrombosis, proteinuria, renal failure, etc,.  Patient voiced good understanding, and agrees to proceed. -I also discussed the alternative therapy such as Y90, given his large tumor burden in liver, and symptomatic disease.  Will discuss this with interventional radiology in the conference, we will probably consider both systemic therapy and liver targeted therapy together, for better disease control.  IPF (idiopathic pulmonary fibrosis) (HCC) -f/u with pulmonary     PLAN: -Discuss Biopsy result  - I prescribe Compazine.for nausea - Tumor Marker result reviewed   -I recommend systemic therapy with atezolizumab and bevacizumab -Will obtain CT chest without contrast in the next few weeks to complete his staging -Will review his case in GI conference, particularly the role of Y90 -Start treatment next week -Chemo education    SUMMARY OF ONCOLOGIC HISTORY: Oncology History Overview Note   Cancer Staging  Hepatocellular carcinoma (HCC) Staging form: Liver, AJCC 8th Edition - Clinical stage from 08/30/2022: Stage IVA (cT3, cN1, cM0) - Signed by Malachy Mood, MD on 09/03/2022 Stage prefix: Initial diagnosis     Hepatocellular carcinoma (HCC)  08/30/2022 Cancer Staging   Staging form: Liver, AJCC 8th Edition - Clinical stage from 08/30/2022: Stage IVA (cT3, cN1, cM0) - Signed by Malachy Mood, MD on 09/03/2022 Stage prefix: Initial diagnosis   09/03/2022 Initial Diagnosis   Hepatocellular carcinoma (HCC)   09/11/2022 -  Chemotherapy   Patient is on Treatment Plan : LUNG Atezolizumab + Bevacizumab Maintenance q21d        INTERVAL HISTORY:  Mathew [name] is here for a follow up of  Hepatocellular carcinoma (HCC.He was last seen by me on 08/21/2022. He presents to the clinic accompanied by wife. Pt stated that he was in the ER for nausea and vomiting and received IV hydration. Pt state  that he is excessive saliva in mouth. Pt if he missed     All other systems were reviewed with the patient and are negative.  MEDICAL HISTORY:  Past Medical History:  Diagnosis Date   Hepatitis C    s/p treatment   Hypertension    Obstructive sleep apnea    unable to tolerate CPAP   Premature ventricular contraction    Prostate cancer (HCC) 2006  SURGICAL HISTORY: Past Surgical History:  Procedure Laterality Date   LOOP RECORDER INSERTION N/A 07/04/2016   Procedure: Loop Recorder Insertion;  Surgeon: Hillis Range, MD;  Location: [facility];  Service: Cardiovascular;  Laterality: N/A;   prostectomy     SPLENECTOMY, TOTAL      I have reviewed the  social history and family history with the patient and they are unchanged from previous note.  ALLERGIES:  has No Known Allergies.  MEDICATIONS:  Current Outpatient Medications  Medication Sig Dispense Refill   pantoprazole (PROTONIX) 40 MG tablet Take 1 tablet (40 mg total) by mouth daily. 30 tablet 2   prochlorperazine (COMPAZINE) 10 MG tablet Take 1 tablet (10 mg total) by mouth every 6 (six) hours as needed for nausea or vomiting. 30 tablet 0   amLODipine (NORVASC) 5 MG tablet Take 1 tablet (5 mg total) by mouth daily. Please make overdue appt with Dr. Johney Frame before anymore refills. 3rd and Final Attempt 15 tablet 0   APIXABAN (ELIQUIS) VTE STARTER PACK (10MG  AND 5MG ) Take as directed on package: start with two-5mg  tablets twice daily for 7 days. On day 8, switch to one-5mg  tablet twice daily. 1 each 0   escitalopram (LEXAPRO) 10 MG tablet Take 10 mg by mouth daily.     losartan (COZAAR) 100 MG tablet Take 1 tablet (100 mg total) by mouth daily. Please make overdue appt with Dr. Johney Frame before anymore refills. 3rd and Final Attempt 15 tablet 0   metoprolol succinate (TOPROL-XL) 100 MG 24 hr tablet Take 1 tablet (100 mg total) by mouth daily. NEED TO SCHEDULE APPOINTMENT 20 tablet 0   Multiple Vitamin (MULTIVITAMIN WITH MINERALS) TABS tablet Take 1 tablet by mouth daily.     ondansetron (ZOFRAN-ODT) 4 MG disintegrating tablet Take 1 tablet (4 mg total) by mouth every 8 (eight) hours as needed for nausea or vomiting. 8 tablet 0   pravastatin (PRAVACHOL) 20 MG tablet Take 20 mg by mouth daily.     promethazine (PHENERGAN) 25 MG tablet Take 1 tablet (25 mg total) by mouth every 8 (eight) hours as needed for nausea. 8 tablet 0   valACYclovir (VALTREX) 500 MG tablet Take 1 tablet by mouth daily as needed. For cold sore flare ups     No current facility-administered medications for this visit.    PHYSICAL EXAMINATION: ECOG PERFORMANCE STATUS: 1 - Symptomatic but completely ambulatory  Vitals:    09/04/22 1052  BP: (!) 148/99  Pulse: 95  Resp: 18  Temp: 98.1 F (36.7 C)  SpO2: 97%   Wt Readings from Last 3 Encounters:  09/04/22 205 lb 1.6 oz (93 kg)  09/02/22 199 lb (90.3 kg)  08/30/22 207 lb 8 oz (94.1 kg)     GENERAL:alert, no distress and comfortable SKIN: skin color normal, no rashes or significant lesions EYES: normal, Conjunctiva are pink and non-injected, sclera clear  NEURO: alert & oriented x 3 with fluent speech LABORATORY DATA:  I have reviewed the data as listed    Latest Ref Rng & Units 09/02/2022   10:10 AM 08/30/2022   11:25 AM 08/19/2022    9:41 AM  CBC  WBC 4.0 - 10.5 K/uL 9.7  9.7  10.8   Hemoglobin 13.0 - 17.0 g/dL 19.1  47.8  29.5   Hematocrit 39.0 - 52.0 % 54.5  52.3  50.4   Platelets 150 - 400 K/uL 250  261  205         Latest Ref Rng &  Units 09/02/2022   10:10 AM 08/30/2022   11:25 AM 08/19/2022    9:41 AM  CMP  Glucose 70 - 99 mg/dL 161  91  096   BUN 8 - 23 mg/dL 21  15  16    Creatinine 0.61 - 1.24 mg/dL 0.45  4.09  8.11   Sodium 135 - 145 mmol/L 133  135  134   Potassium 3.5 - 5.1 mmol/L 3.8  3.7  3.7   Chloride 98 - 111 mmol/L 102  102  98   CO2 22 - 32 mmol/L 22  22  21    Calcium 8.9 - 10.3 mg/dL 9.4  9.4  9.2   Total Protein 6.5 - 8.1 g/dL 7.9  7.9  7.4   Total Bilirubin 0.3 - 1.2 mg/dL 1.1  1.2  1.4   Alkaline Phos 38 - 126 U/L 94  101  84   AST 15 - 41 U/L 59  62  53   ALT 0 - 44 U/L 31  35  24       RADIOGRAPHIC STUDIES: I have personally reviewed the radiological images as listed and agreed with the findings in the report. No results found.    Orders Placed This Encounter  Procedures   CT Chest Wo Contrast    Liver Cancer    Standing Status:   Future    Standing Expiration Date:   09/04/2023    Order Specific Question:   Preferred imaging location?    Answer:   [facility]    Order Specific Question:   Release to patient    Answer:   Immediate [1]   CBC with Differential (Cancer Center Only)     Standing Status:   Future    Standing Expiration Date:   09/11/2023   CMP (Cancer Center only)    Standing Status:   Future    Standing Expiration Date:   09/11/2023   T4    Standing Status:   Future    Standing Expiration Date:   09/11/2023   TSH    Standing Status:   Future    Standing Expiration Date:   09/11/2023   Total Protein, Urine dipstick    Standing Status:   Future    Standing Expiration Date:   09/11/2023   CBC with Differential (Cancer Center Only)    Standing Status:   Future    Standing Expiration Date:   10/02/2023   CMP (Cancer Center only)    Standing Status:   Future    Standing Expiration Date:   10/02/2023   CBC with Differential (Cancer Center Only)    Standing Status:   Future    Standing Expiration Date:   10/23/2023   CMP (Cancer Center only)    Standing Status:   Future    Standing Expiration Date:   10/23/2023   T4    Standing Status:   Future    Standing Expiration Date:   10/23/2023   TSH    Standing Status:   Future    Standing Expiration Date:   10/23/2023   Total Protein, Urine dipstick    Standing Status:   Future    Standing Expiration Date:   10/23/2023   All questions were answered. The patient knows to call the clinic with any problems, questions or concerns. No barriers to learning was detected. The total time spent in the appointment was 30 minutes.     Malachy Mood, MD 09/04/2022   I, Monica Martinez,  CMA, am acting as scribe for Truitt Merle, MD.   I have reviewed the above documentation for accuracy and completeness, and I agree with the above.

## 2022-09-05 ENCOUNTER — Inpatient Hospital Stay: Payer: BC Managed Care – PPO

## 2022-09-05 ENCOUNTER — Other Ambulatory Visit: Payer: Self-pay

## 2022-09-05 NOTE — Progress Notes (Signed)
Pharmacist Chemotherapy Monitoring - Initial Assessment    Anticipated start date: 5/9  The following has been reviewed per standard work regarding the patient's treatment regimen: The patient's diagnosis, treatment plan and drug doses, and organ/hematologic function Lab orders and baseline tests specific to treatment regimen  The treatment plan start date, drug sequencing, and pre-medications Prior authorization status  Patient's documented medication list, including drug-drug interaction screen and prescriptions for anti-emetics and supportive care specific to the treatment regimen The drug concentrations, fluid compatibility, administration routes, and timing of the medications to be used The patient's access for treatment and lifetime cumulative dose history, if applicable  The patient's medication allergies and previous infusion related reactions, if applicable   Changes made to treatment plan:  N/A  Follow up needed:  N/A   Demetrius Charity, RPH, 09/05/2022  11:57 AM

## 2022-09-10 ENCOUNTER — Ambulatory Visit (HOSPITAL_BASED_OUTPATIENT_CLINIC_OR_DEPARTMENT_OTHER)
Admission: RE | Admit: 2022-09-10 | Discharge: 2022-09-10 | Disposition: A | Discharge status: Home / Self Care | Payer: BC Managed Care – PPO | Source: Ambulatory Visit | Attending: Hematology | Admitting: Hematology

## 2022-09-10 DIAGNOSIS — C220 Liver cell carcinoma: Secondary | ICD-10-CM | POA: Diagnosis not present

## 2022-09-11 ENCOUNTER — Other Ambulatory Visit: Payer: Self-pay | Admitting: Hematology

## 2022-09-11 ENCOUNTER — Other Ambulatory Visit: Payer: Self-pay

## 2022-09-11 DIAGNOSIS — C220 Liver cell carcinoma: Secondary | ICD-10-CM

## 2022-09-11 IMAGING — CT CT CHEST LUNG CANCER SCREENING LOW DOSE W/O CM
2 of 5 series · 14 of 40 positions shown, 17 images · non-contrast
Comparison: Low-dose lung cancer screening chest CT 03/08/2020.

CLINICAL DATA: 70-year-old male with 31 pack-year history of
smoking. Lung cancer screening examination.

EXAM:
CT CHEST WITHOUT CONTRAST LOW-DOSE FOR LUNG CANCER SCREENING
TECHNIQUE: Multidetector CT imaging of the chest was performed following the
standard protocol without IV contrast.

[Series 4: lung 1.00 br44 cor · coronal · 0.70mm/px · 3 of 416 slices shown]
[im 84/416  lung]
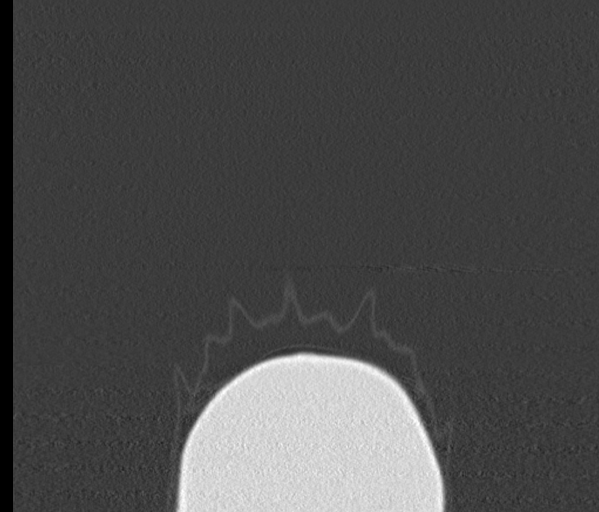
[im 167/416  lung]
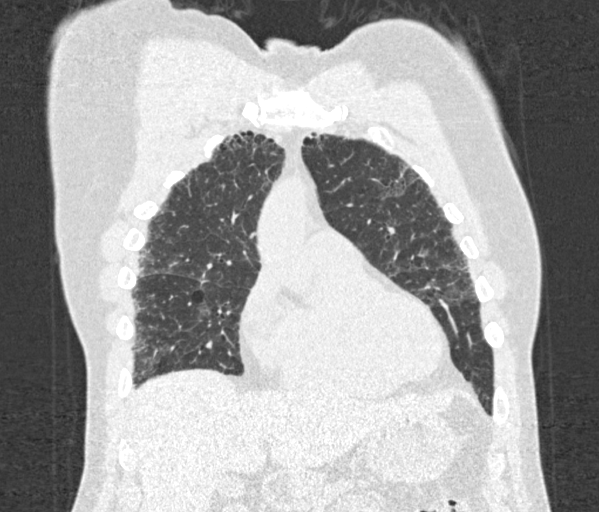
[im 250/416  lung]
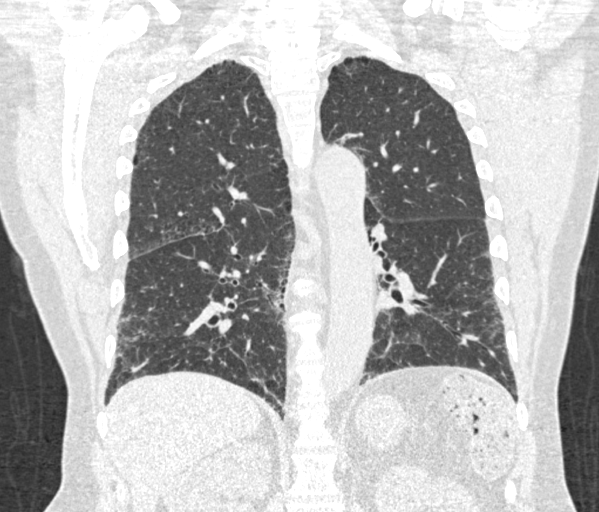

[Series 9: lung 1.00 br60 axial · axial · 0.82mm/px · z∈[-1022,-699]mm · 11 of 357 slices shown, 14 images]
[im 17/357  mediastinal]
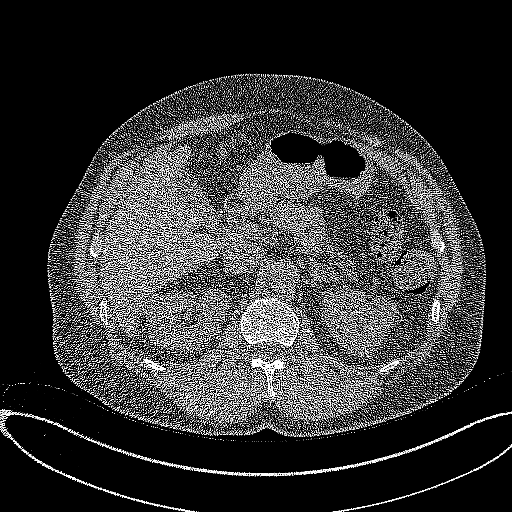
[im 17/357  lung]
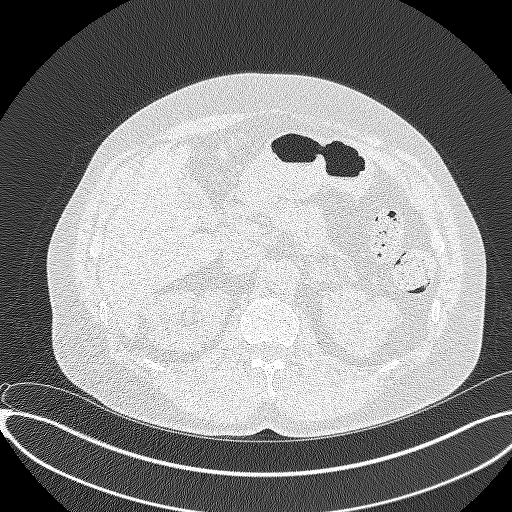
[im 49/357  lung]
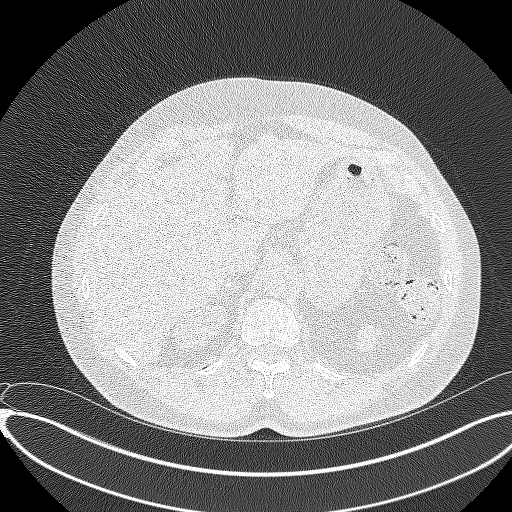
[im 81/357  lung]
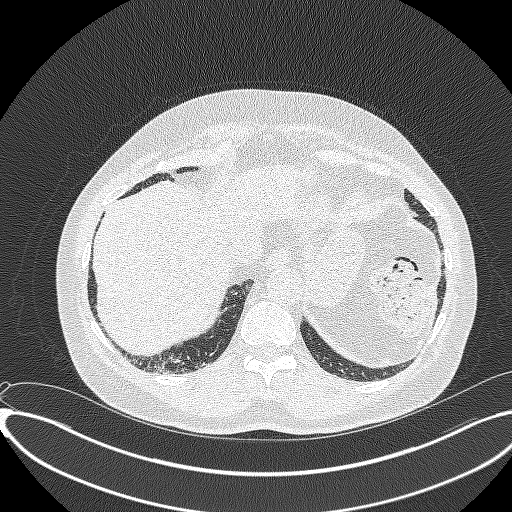
[im 114/357  lung]
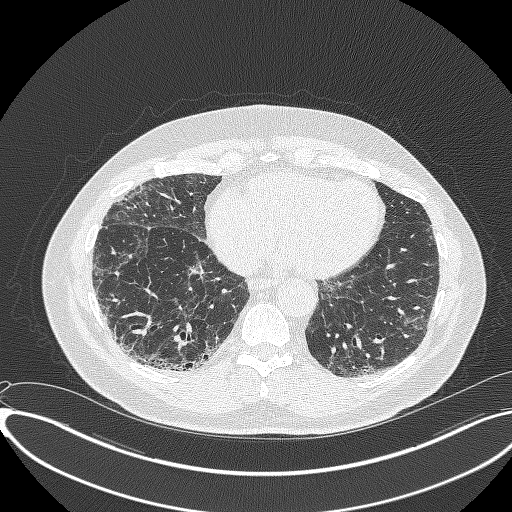
[im 146/357  mediastinal]
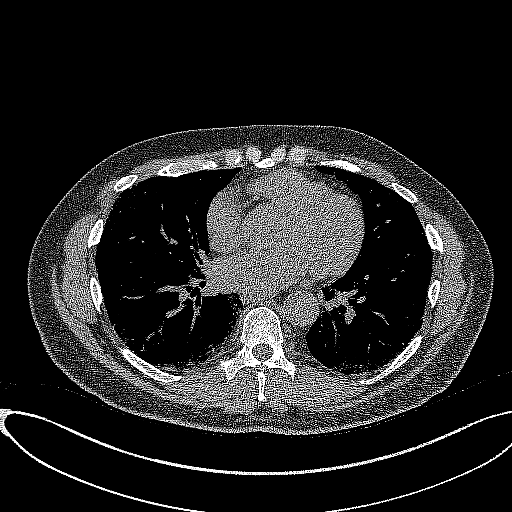
[im 146/357  lung]
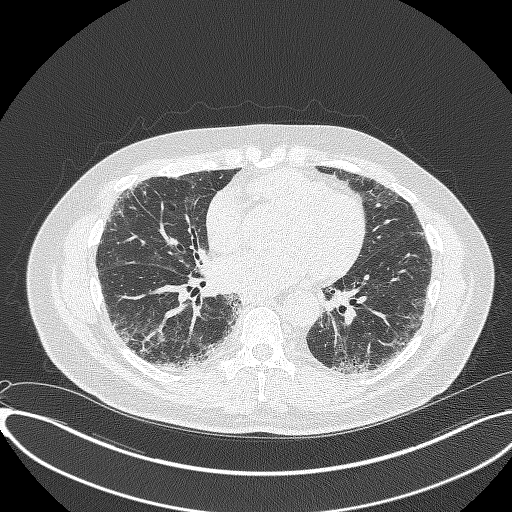
[im 179/357  lung]
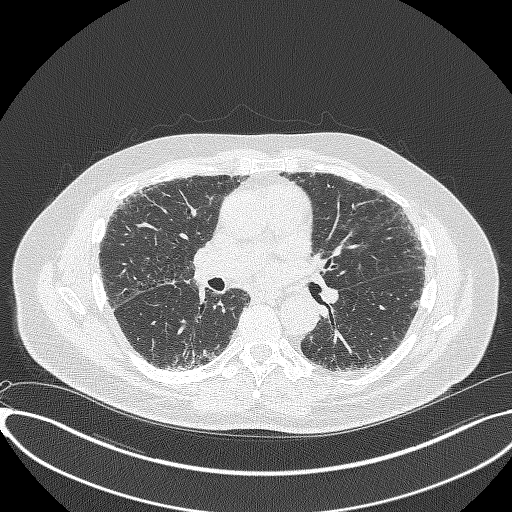
[im 211/357  lung]
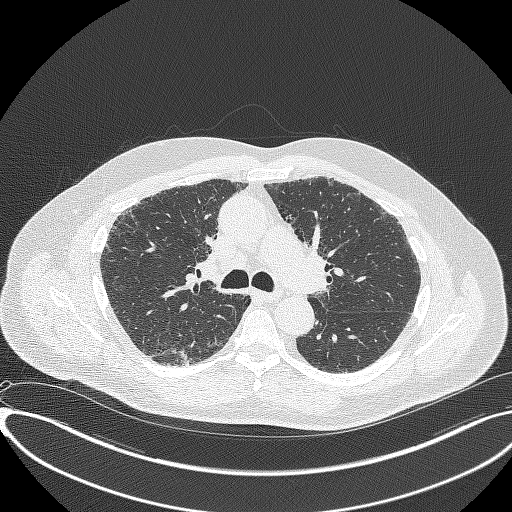
[im 243/357  lung]
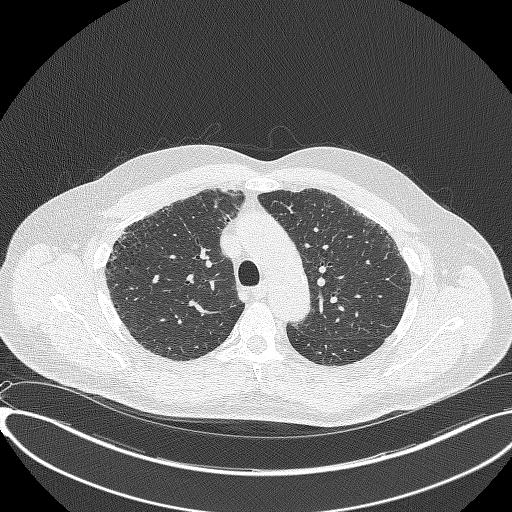
[im 276/357  mediastinal]
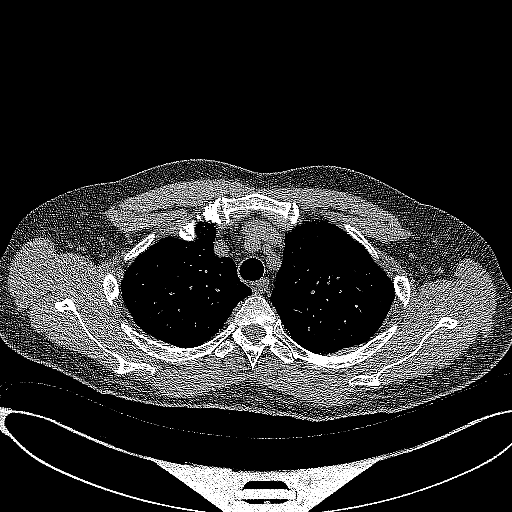
[im 276/357  lung]
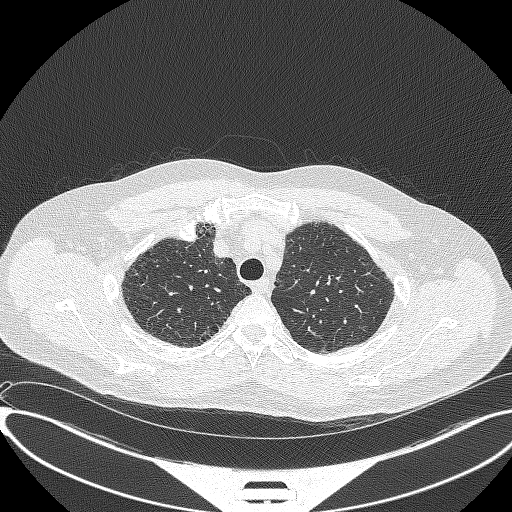
[im 308/357  lung]
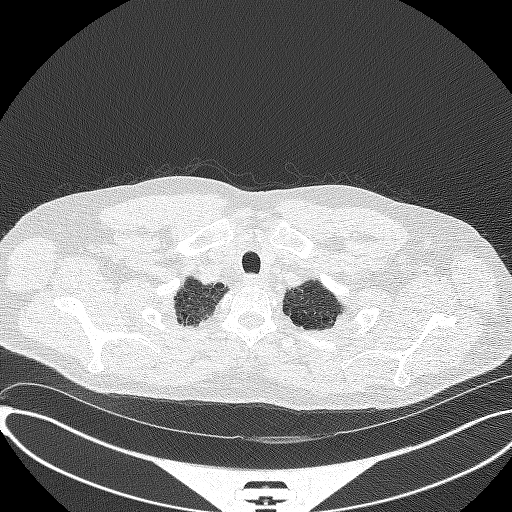
[im 340/357  lung]
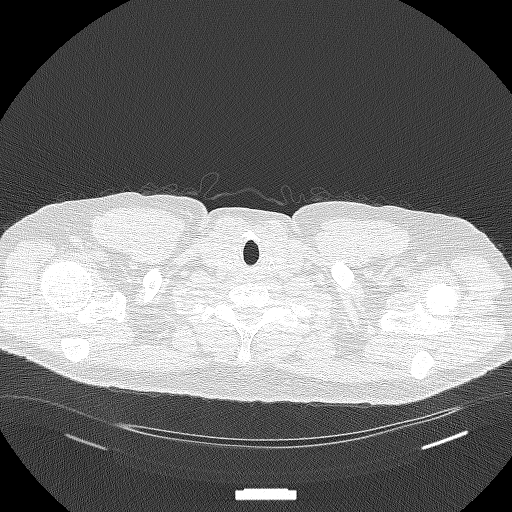

[14 of 40 positions shown; findings below may reference images not displayed]

FINDINGS: Cardiovascular: Heart size is normal. There is no significant
pericardial fluid, thickening or pericardial calcification. There is
aortic atherosclerosis, as well as atherosclerosis of the great
vessels of the mediastinum and the coronary arteries, including
calcified atherosclerotic plaque in the left main, left anterior
descending, left circumflex and right coronary arteries.

Mediastinum/Nodes: No pathologically enlarged mediastinal or hilar
lymph nodes. Please note that accurate exclusion of hilar adenopathy
is limited on noncontrast CT scans. Esophagus is unremarkable in
appearance. No axillary lymphadenopathy.

Lungs/Pleura: Small pulmonary nodule in the periphery of the left
upper lobe (axial image 118 of series 3), with a volume derived mean
diameter of 3.2 mm. No larger more suspicious appearing pulmonary
nodules or masses are noted. No acute consolidative airspace
disease. No pleural effusions. Diffuse bronchial wall thickening
with mild centrilobular and paraseptal emphysema. In addition, there
are widespread but patchy areas of peripheral predominant
ground-glass attenuation, septal thickening, subpleural
reticulation, mild cylindrical traction bronchiectasis and
peripheral bronchiolectasis scattered throughout the lungs
bilaterally with a mild craniocaudal gradient.

Upper Abdomen: Unremarkable.

Musculoskeletal: Electronic device in the subcutaneous fat of the
left chest wall anteriorly, presumably an implantable loop recorder.
There are no aggressive appearing lytic or blastic lesions noted in
the visualized portions of the skeleton.
IMPRESSION: 1. Lung-RADS 2S, benign appearance or behavior. Continue annual
screening with low-dose chest CT without contrast in 12 months.
2. The "S" modifier above refers to potentially clinically
significant non lung cancer related findings. Specifically, there is
evidence of interstitial lung disease, with a spectrum of findings
categorized as probable usual interstitial pneumonia (UIP) per
current ATS guidelines. Outpatient referral to Pulmonology for
further clinical evaluation is recommended in the near future.
3. Aortic atherosclerosis, in addition to left main and three-vessel
coronary artery disease. Please note that although the presence of
coronary artery calcium documents the presence of coronary artery
disease, the severity of this disease and any potential stenosis
cannot be assessed on this non-gated CT examination. Assessment for
potential risk factor modification, dietary therapy or pharmacologic
therapy may be warranted, if clinically indicated.
4. Mild diffuse bronchial wall thickening with mild centrilobular
and paraseptal emphysema; imaging findings suggestive of underlying
COPD.

Aortic Atherosclerosis (Z56HJ-50Q.Q) and Emphysema (Z56HJ-AT7.D).

## 2022-09-11 NOTE — Progress Notes (Signed)
I spoke with Mr Legge and relayed the recommendations from [facility] conference today.  He is in agreement.  I have cancelled the PET scan.  All questions were answered.  He verbalized understanding.

## 2022-09-11 NOTE — Progress Notes (Signed)
The proposed treatment discussed in conference is for discussion purpose only and is not a binding recommendation.  The patients have not been physically examined, or presented with their treatment options.  Therefore, final treatment plans cannot be decided.  

## 2022-09-12 ENCOUNTER — Inpatient Hospital Stay: Payer: BC Managed Care – PPO

## 2022-09-12 ENCOUNTER — Inpatient Hospital Stay: Payer: BC Managed Care – PPO | Admitting: Hematology

## 2022-09-12 ENCOUNTER — Other Ambulatory Visit: Payer: Self-pay

## 2022-09-12 ENCOUNTER — Encounter: Payer: Self-pay | Admitting: Hematology

## 2022-09-12 ENCOUNTER — Inpatient Hospital Stay (HOSPITAL_BASED_OUTPATIENT_CLINIC_OR_DEPARTMENT_OTHER): Payer: BC Managed Care – PPO

## 2022-09-12 VITALS — BP 159/95 | HR 72 | Resp 14

## 2022-09-12 VITALS — BP 156/101 | HR 84 | Temp 98.4°F | Resp 18 | Ht 75.0 in | Wt 198.2 lb

## 2022-09-12 DIAGNOSIS — C220 Liver cell carcinoma: Secondary | ICD-10-CM

## 2022-09-12 DIAGNOSIS — J84112 Idiopathic pulmonary fibrosis: Secondary | ICD-10-CM | POA: Diagnosis not present

## 2022-09-12 DIAGNOSIS — Z5112 Encounter for antineoplastic immunotherapy: Secondary | ICD-10-CM | POA: Diagnosis not present

## 2022-09-12 LAB — CBC WITH DIFFERENTIAL (CANCER CENTER ONLY)
Abs Immature Granulocytes: 0.02 10*3/uL (ref 0.00–0.07)
Basophils Absolute: 0.1 10*3/uL (ref 0.0–0.1)
Basophils Relative: 2 %
Eosinophils Absolute: 0.2 10*3/uL (ref 0.0–0.5)
Eosinophils Relative: 4 %
HCT: 51.6 % (ref 39.0–52.0)
Hemoglobin: 17.4 g/dL — ABNORMAL HIGH (ref 13.0–17.0)
Immature Granulocytes: 0 %
Lymphocytes Relative: 33 %
Lymphs Abs: 2.2 10*3/uL (ref 0.7–4.0)
MCH: 28.8 pg (ref 26.0–34.0)
MCHC: 33.7 g/dL (ref 30.0–36.0)
MCV: 85.3 fL (ref 80.0–100.0)
Monocytes Absolute: 0.9 10*3/uL (ref 0.1–1.0)
Monocytes Relative: 14 %
Neutro Abs: 3.2 10*3/uL (ref 1.7–7.7)
Neutrophils Relative %: 47 %
Platelet Count: 170 10*3/uL (ref 150–400)
RBC: 6.05 MIL/uL — ABNORMAL HIGH (ref 4.22–5.81)
RDW: 16 % — ABNORMAL HIGH (ref 11.5–15.5)
WBC Count: 6.7 10*3/uL (ref 4.0–10.5)
nRBC: 0 % (ref 0.0–0.2)

## 2022-09-12 LAB — CMP (CANCER CENTER ONLY)
ALT: 17 U/L (ref 0–44)
AST: 73 U/L — ABNORMAL HIGH (ref 15–41)
Albumin: 3.8 g/dL (ref 3.5–5.0)
Alkaline Phosphatase: 117 U/L (ref 38–126)
Anion gap: 8 (ref 5–15)
BUN: 16 mg/dL (ref 8–23)
CO2: 26 mmol/L (ref 22–32)
Calcium: 9.6 mg/dL (ref 8.9–10.3)
Chloride: 103 mmol/L (ref 98–111)
Creatinine: 1.07 mg/dL (ref 0.61–1.24)
GFR, Estimated: >60 mL/min (ref >60)
Glucose, Bld: 97 mg/dL (ref 70–99)
Potassium: 3.8 mmol/L (ref 3.5–5.1)
Sodium: 137 mmol/L (ref 135–145)
Total Bilirubin: 0.7 mg/dL (ref 0.3–1.2)
Total Protein: 8.1 g/dL (ref 6.5–8.1)

## 2022-09-12 LAB — TSH: TSH: 1.175 u[IU]/mL (ref 0.350–4.500)

## 2022-09-12 LAB — TOTAL PROTEIN, URINE DIPSTICK: Protein, ur: 30 mg/dL — AB

## 2022-09-12 MED ORDER — SODIUM CHLORIDE 0.9 % IV SOLN
15.0000 mg/kg | Freq: Once | INTRAVENOUS | Status: AC
  Administered 2022-09-12: 1400 mg via INTRAVENOUS
  Filled 2022-09-12: qty 8

## 2022-09-12 MED ORDER — SODIUM CHLORIDE 0.9 % IV SOLN: Freq: Once | INTRAVENOUS | Status: AC

## 2022-09-12 MED ORDER — SODIUM CHLORIDE 0.9 % IV SOLN
1200.0000 mg | Freq: Once | INTRAVENOUS | Status: AC
  Administered 2022-09-12: 1200 mg via INTRAVENOUS
  Filled 2022-09-12: qty 20

## 2022-09-12 NOTE — Progress Notes (Signed)
[facility]   Telephone:(336) [phone] Fax:(336) [phone]   Clinic Follow up Note   Patient Care Team: Patient, No Pcp Per as PCP - General (General Practice) Malachy Mood, MD as Consulting Physician (Oncology)  Date of Service:  09/12/2022  CHIEF COMPLAINT: f/u of Hepatocellular carcinoma (HCC)    CURRENT THERAPY:   First line Atezolizumab+ Bevacizumab  q21d      ASSESSMENT:  Mathew [name] is a 72 y.o. male with   Hepatocellular carcinoma (HCC) -cT3N1M0 stage IVA -presented with abdominal bloating, nausea and diarrhea satiety.  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  He is not a candidate for liver transplant due to the large size of his tumor.  I doubt he is a candidate for upfront surgery -I recommend systemic treatment given the high tumor burden and high AFP -I recommend first-line therapy with Atezo and bevacizumab. Plan to start today -His case was discussed in GI tumor board.  Although his liver cancer is technically resectable, due to the extremely high AFP, his risk of recurrence is extremely high.  We recommend systemic therapy, and possible bland embolization, to see how he responds.  I will refer him to pancreatobiliary surgeon first  IPF (idiopathic pulmonary fibrosis) (HCC) -f/u with pulmonary      PLAN: -lab reviewed -encourage the pt to drink a nutrient supplement. -referral to  see surgeon Dr. Freida Busman or Donell Beers -repeat scan after 4 cycle of treatment -MR Liver schedule 5/17 -will proceed Atezo and bevacizumab today and continue every 3 weeks  -lab and f/u 5/30    SUMMARY OF ONCOLOGIC HISTORY: Oncology History  Overview Note   Cancer Staging  Hepatocellular carcinoma [facility]) Staging form: Liver, AJCC 8th Edition - Clinical stage from 08/30/2022: Stage IVA (cT3, cN1, cM0) - Signed by Malachy Mood, MD on 09/03/2022 Stage prefix: Initial diagnosis     Hepatocellular carcinoma (HCC)  08/30/2022 Cancer Staging   Staging form: Liver, AJCC 8th Edition - Clinical stage from 08/30/2022: Stage IVA (cT3, cN1, cM0) - Signed by Malachy Mood, MD on 09/03/2022 Stage prefix: Initial diagnosis   09/03/2022 Initial Diagnosis   Hepatocellular carcinoma (HCC)   09/10/2022 Imaging    IMPRESSION: No evidence of metastatic disease or other within the thorax.   Stable chronic interstitial lung disease.   Aortic Atherosclerosis (ICD10-I70.0).   09/12/2022 -  Chemotherapy   Patient is on Treatment Plan : LUNG Atezolizumab + Bevacizumab Maintenance q21d        INTERVAL HISTORY:  Mathew [name] is here for a follow up of Hepatocellular carcinoma (HCC. He was last seen by me on 09/04/2022. He presents to the clinic accompanied by wife. Pt state that he has not been eating a lot. Pt state that he has taken the antinausea pills and it had helped with the gagging and vomiting.   All other systems were reviewed with the patient and are negative.  MEDICAL HISTORY:  Past Medical History:  Diagnosis Date   Hepatitis C    s/p treatment   Hypertension    Obstructive sleep apnea    unable to tolerate CPAP   Premature ventricular contraction    Prostate cancer (HCC) 2006  SURGICAL HISTORY: Past Surgical History:  Procedure Laterality Date   LOOP RECORDER INSERTION N/A 07/04/2016   Procedure: Loop Recorder Insertion;  Surgeon: Hillis Range, MD;  Location: [facility];  Service: Cardiovascular;  Laterality: N/A;   prostectomy     SPLENECTOMY, TOTAL      I have reviewed the social history and family history with the patient and they are unchanged from previous note.  ALLERGIES:  has No Known Allergies.  MEDICATIONS:   Current Outpatient Medications  Medication Sig Dispense Refill   amLODipine (NORVASC) 5 MG tablet Take 1 tablet (5 mg total) by mouth daily. Please make overdue appt with Dr. Johney Frame before anymore refills. 3rd and Final Attempt 15 tablet 0   APIXABAN (ELIQUIS) VTE STARTER PACK (10MG  AND 5MG ) Take as directed on package: start with two-5mg  tablets twice daily for 7 days. On day 8, switch to one-5mg  tablet twice daily. 1 each 0   escitalopram (LEXAPRO) 10 MG tablet Take 10 mg by mouth daily.     losartan (COZAAR) 100 MG tablet Take 1 tablet (100 mg total) by mouth daily. Please make overdue appt with Dr. Johney Frame before anymore refills. 3rd and Final Attempt 15 tablet 0   metoprolol succinate (TOPROL-XL) 100 MG 24 hr tablet Take 1 tablet (100 mg total) by mouth daily. NEED TO SCHEDULE APPOINTMENT 20 tablet 0   Multiple Vitamin (MULTIVITAMIN WITH MINERALS) TABS tablet Take 1 tablet by mouth daily.     ondansetron (ZOFRAN-ODT) 4 MG disintegrating tablet Take 1 tablet (4 mg total) by mouth every 8 (eight) hours as needed for nausea or vomiting. 8 tablet 0   pantoprazole (PROTONIX) 40 MG tablet Take 1 tablet (40 mg total) by mouth daily. 30 tablet 2   pravastatin (PRAVACHOL) 20 MG tablet Take 20 mg by mouth daily.     prochlorperazine (COMPAZINE) 10 MG tablet Take 1 tablet (10 mg total) by mouth every 6 (six) hours as needed for nausea or vomiting. 30 tablet 0   promethazine (PHENERGAN) 25 MG tablet Take 1 tablet (25 mg total) by mouth every 8 (eight) hours as needed for nausea. 8 tablet 0   valACYclovir (VALTREX) 500 MG tablet Take 1 tablet by mouth daily as needed. For cold sore flare ups     No current facility-administered medications for this visit.    PHYSICAL EXAMINATION: ECOG PERFORMANCE STATUS: 1 - Symptomatic but completely ambulatory  Vitals:   09/12/22 1300  BP: (!) 156/101  Pulse: 84  Resp: 18  Temp: 98.4 F (36.9 C)  SpO2: 97%   Wt Readings from Last 3 Encounters:  09/12/22 198  lb 3.2 oz (89.9 kg)  09/04/22 205 lb 1.6 oz (93 kg)  09/02/22 199 lb (90.3 kg)     GENERAL:alert, no distress and comfortable SKIN: skin color normal, no rashes or significant lesions EYES: normal, Conjunctiva are pink and non-injected, sclera clear  NEURO: alert & oriented x 3 with fluent speech   LABORATORY DATA:  I have reviewed the data as listed    Latest Ref Rng & Units 09/12/2022   12:32 PM 09/02/2022   10:10 AM 08/30/2022   11:25 AM  CBC  WBC 4.0 - 10.5 K/uL 6.7  9.7  9.7   Hemoglobin 13.0 - 17.0 g/dL 16.1  09.6  04.5   Hematocrit 39.0 - 52.0 % 51.6  54.5  52.3   Platelets 150 - 400 K/uL 170  250  261         Latest Ref  Rng & Units 09/12/2022   12:32 PM 09/02/2022   10:10 AM 08/30/2022   11:25 AM  CMP  Glucose 70 - 99 mg/dL 97  540  91   BUN 8 - 23 mg/dL 16  21  15    Creatinine 0.61 - 1.24 mg/dL 9.81  1.91  4.78   Sodium 135 - 145 mmol/L 137  133  135   Potassium 3.5 - 5.1 mmol/L 3.8  3.8  3.7   Chloride 98 - 111 mmol/L 103  102  102   CO2 22 - 32 mmol/L 26  22  22    Calcium 8.9 - 10.3 mg/dL 9.6  9.4  9.4   Total Protein 6.5 - 8.1 g/dL 8.1  7.9  7.9   Total Bilirubin 0.3 - 1.2 mg/dL 0.7  1.1  1.2   Alkaline Phos 38 - 126 U/L 117  94  101   AST 15 - 41 U/L 73  59  62   ALT 0 - 44 U/L 17  31  35       RADIOGRAPHIC STUDIES: I have personally reviewed the radiological images as listed and agreed with the findings in the report. No results found.    Orders Placed This Encounter  Procedures   AFP tumor marker    Standing Status:   Standing    Number of Occurrences:   20    Standing Expiration Date:   09/12/2023   Ambulatory Referral to [facility] Nutrition    Referral Priority:   Urgent    Referral Type:   Consultation    Referral Reason:   Specialty Services Required    Number of Visits Requested:   1   Ambulatory referral to General Surgery    Referral Priority:   Routine    Referral Type:   Surgical    Referral Reason:   Specialty Services Required     Requested Specialty:   General Surgery    Number of Visits Requested:   1   All questions were answered. The patient knows to call the clinic with any problems, questions or concerns. No barriers to learning was detected. The total time spent in the appointment was 25 minutes.     Malachy Mood, MD 09/12/2022   Carolin Coy, CMA, am acting as scribe for Malachy Mood, MD.   I have reviewed the above documentation for accuracy and completeness, and I agree with the above.

## 2022-09-12 NOTE — Patient Instructions (Signed)
[facility]  Discharge Instructions: Thank you for choosing [facility] to provide your oncology and hematology care.   If you have a lab appointment with the Cancer Center, please go directly to the Cancer Center and check in at the registration area.   Wear comfortable clothing and clothing appropriate for easy access to any Portacath or PICC line.   We strive to give you quality time with your provider. You may need to reschedule your appointment if you arrive late (15 or more minutes).  Arriving late affects you and other patients whose appointments are after yours.  Also, if you miss three or more appointments without notifying the office, you may be dismissed from the clinic at the provider's discretion.      For prescription refill requests, have your pharmacy contact our office and allow 72 hours for refills to be completed.    Today you received the following chemotherapy and/or immunotherapy agents: Atezolizumab (Tecentriq) & Bevacizumab (MVASI)      To help prevent nausea and vomiting after your treatment, we encourage you to take your nausea medication as directed.  BELOW ARE SYMPTOMS THAT SHOULD BE REPORTED IMMEDIATELY: *FEVER GREATER THAN 100.4 F (38 C) OR HIGHER *CHILLS OR SWEATING *NAUSEA AND VOMITING THAT IS NOT CONTROLLED WITH YOUR NAUSEA MEDICATION *UNUSUAL SHORTNESS OF BREATH *UNUSUAL BRUISING OR BLEEDING *URINARY PROBLEMS (pain or burning when urinating, or frequent urination) *BOWEL PROBLEMS (unusual diarrhea, constipation, pain near the anus) TENDERNESS IN MOUTH AND THROAT WITH OR WITHOUT PRESENCE OF ULCERS (sore throat, sores in mouth, or a toothache) UNUSUAL RASH, SWELLING OR PAIN  UNUSUAL VAGINAL DISCHARGE OR ITCHING   Items with * indicate a potential emergency and should be followed up as soon as possible or go to the Emergency Department if any problems should occur.  Please show the CHEMOTHERAPY ALERT CARD  or IMMUNOTHERAPY ALERT CARD at check-in to the Emergency Department and triage nurse.  Should you have questions after your visit or need to cancel or reschedule your appointment, please contact [facility]  Dept: [phone]  and follow the prompts.  Office hours are 8:00 a.m. to 4:30 p.m. Monday - Friday. Please note that voicemails left after 4:00 p.m. may not be returned until the following business day.  We are closed weekends and major holidays. You have access to a nurse at all times for urgent questions. Please call the main number to the clinic Dept: [phone] and follow the prompts.   For any non-urgent questions, you may also contact your provider using MyChart. We now offer e-Visits for anyone 71 and older to request care online for non-urgent symptoms. For details visit [email or web address].   Also download the MyChart app! Go to the app store, search "MyChart", open the app, select [facility], and log in with your MyChart username and password.

## 2022-09-12 NOTE — Assessment & Plan Note (Signed)
-  f/u with pulmonary  

## 2022-09-12 NOTE — Assessment & Plan Note (Signed)
-  cT3N1M0 stage IVA -presented with abdominal bloating, nausea and diarrhea satiety.  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  He is not a candidate for liver transplant due to the large size of his tumor.  I doubt he is a candidate for upfront surgery -I recommend systemic treatment given the high tumor burden and high AFP -I recommend first-line therapy with eculizumab and bevacizumab. Plan to start today -His case was discussed in GI tumor board.  Although his liver cancer is technically resectable, due to the extremely high AFP, his risk of recurrence is extremely high.  We recommend systemic therapy, and possible bland embolization, to see how he responds.  I will refer him to pancreatobiliary surgeon and IR.

## 2022-09-13 ENCOUNTER — Telehealth: Payer: Self-pay | Admitting: *Deleted

## 2022-09-13 LAB — T4: T4, Total: 10.4 ug/dL (ref 4.5–12.0)

## 2022-09-13 NOTE — Telephone Encounter (Signed)
Called pt to see how he did with his recent treatment.  He reports some nausea yesterday but didn't take anything. Nausea better today. He states he is staying in the bed.  Encouraged to get up & get some mild exercise if able which might energize him some.  He has eaten today but not drank anything.  Encouraged to get some fluids in & id not tolerated to call us.  Pt knows his appts & how to reach [place] if needed.  Message routed to Dr Feng/Pod RN

## 2022-09-13 NOTE — Telephone Encounter (Signed)
-----   Message from Marylouise Stacks, RN sent at 09/12/2022  4:16 PM EDT ----- Regarding: Dr. Mosetta Putt 1st time Tecentriq/MVASI f/u Dr. Mosetta Putt 1st time Tecentriq/MVASI. Pt tolerated tx well without incident. Pt call back due.

## 2022-09-17 ENCOUNTER — Other Ambulatory Visit: Payer: Self-pay

## 2022-09-17 ENCOUNTER — Inpatient Hospital Stay: Payer: BC Managed Care – PPO | Admitting: Dietician

## 2022-09-17 ENCOUNTER — Telehealth: Payer: Self-pay | Admitting: *Deleted

## 2022-09-17 DIAGNOSIS — C220 Liver cell carcinoma: Secondary | ICD-10-CM

## 2022-09-17 NOTE — Telephone Encounter (Signed)
Telephone call to follow up with patient following triage nurse call on 09/15/22. Patient was evaluated by EMS on 09/15/22 and he declined to go to the ED at that time.   Patient reports he is still experiencing extreme nausea and gagging. He has had minimal fluid, nutrition intake. He takes in some broth, water. RN suggested Gatorade and patient reports this is too expensive right now. Reviewed antiemetic medications. Patient reports he has been taking nausea medications as prescribed. He will be getting a few bottles of Gatorade today. He has transportation issues as he does not feel he can drive himself. He would like to come in for fluids tomorrow afternoon if possible.

## 2022-09-17 NOTE — Telephone Encounter (Signed)
Telephone call returned to patient. He agrees to come in to see [facility] tomorrow for labs and to see Jae Dire. He agrees to stay for IVF if needed. Patient will have his wife bring him.

## 2022-09-17 NOTE — Progress Notes (Signed)
Symptom Management Consult Note [facility]    Patient Care Team: Patient, No Pcp Per as PCP - General (General Practice) Malachy Mood, MD as Consulting Physician (Oncology)    Name / MRN / DOB: Mathew [name]  [id number]  Sep 22, 1950   Date of visit: 09/18/2022   Chief Complaint/Reason for visit: Abdominal pain, nausea, diarrhea and cough   Current Therapy: Atezolizumab+ Bevacizumab   Last treatment:  Day 1   Cycle 1 on 09/12/22   ASSESSMENT & PLAN: Patient is a 72 y.o. male  with oncologic history of hepatocellular carcinoma followed by Dr. Mosetta Putt.  I have viewed most recent oncology note and lab work.    #Hepatocellular carcinoma - Next appointment with oncologist is 10/02/22 - Referral place for palliative care for symptom management and goals of care.  #Cough -Non toxic appearing,afebrile, no hypoxia. Clear lung exam. No respiratory distress. -HPI suggestive of mild pleuritic pain, irritation from coughing. - CBC with mild leukocytosis. In the absence of fever less likely to be infectious. -Discussed OTC mucinex. -Low suspicion for pneumonia or pleural effusion. Will hold off on imaging at this time. If patient develops infectious symptoms would proceed with chest xray. Patient agreeable with plan.  #Decreased PO intake, fatigue, nausea -Patient received IV zofran and 1L NS in clinic from symptom management. - CMP without significant electrolyte derangement. Does show elevated t bili 1.5 which patient has had in the recent past. He has no abdominal tenderness on exam. Will hold off on [place] or CT at this time with benign exam. Patient will have liver MR on 5/17 as already scheduled. -Patient's symptoms have improved prior to clinic arrival. Likely related to treatment. No additional intervention needed. Patient declines needing refills on zofran or compazine.  #Diarrhea - Resolved prior to clinic arrival.  - Likely from treatment. Encouraged OTC imodium in the  future if needed.  Strict ED precautions discussed should symptoms worsen. Shared visit with Dr. Mosetta Putt  Heme/Onc History: Oncology History Overview Note   Cancer Staging  Hepatocellular carcinoma [facility]) Staging form: Liver, AJCC 8th Edition - Clinical stage from 08/30/2022: Stage IVA (cT3, cN1, cM0) - Signed by Malachy Mood, MD on 09/03/2022 Stage prefix: Initial diagnosis     Hepatocellular carcinoma (HCC)  08/30/2022 Cancer Staging   Staging form: Liver, AJCC 8th Edition - Clinical stage from 08/30/2022: Stage IVA (cT3, cN1, cM0) - Signed by Malachy Mood, MD on 09/03/2022 Stage prefix: Initial diagnosis   09/03/2022 Initial Diagnosis   Hepatocellular carcinoma (HCC)   09/10/2022 Imaging    IMPRESSION: No evidence of metastatic disease or other within the thorax.   Stable chronic interstitial lung disease.   Aortic Atherosclerosis (ICD10-I70.0).   09/12/2022 -  Chemotherapy   Patient is on Treatment Plan : LUNG Atezolizumab + Bevacizumab Maintenance q21d         Interval history-: Mathew [name] is a 72 y.o. male with oncologic history as above presenting to [facility] today with chief complaint of abdominal pain, nausea and diarrhea,  and cough.  He presents to clinic unaccompanied.  Patient states he has had intermittent cough x 2 weeks.  He was around his 2 grandkids who had a viral illness.  He states his cough is productive with white phlegm.  He does not have any associated shortness of breath.  He has not had any fever and has been frequently checking temperature at home. He has been taking NyQuil with some symptom relief.  He denies any congestion or  sinus pressure.  Spouse also has similar symptoms.  Patient states that since his treatment on 5/9 he has overall felt poorly. Last night his symptoms have started to improve.  He had persistent nausea.  He has tried taking Zofran and Compazine at home, unsure if they are helping.  He is also having to dry heave and will spit up mucus.  He has  not had any actual vomiting.  He is endorsing 3 days of right upper quadrant pain.  The pain is sharp and only present if he takes a deep breath.  The pain radiates to his shoulder sometimes.  He has not taken any over-the-counter medications for his pain.  He has also been having intermittent diarrhea since treatment.  2 days ago he had over 8 episodes of liquid stool.  In the last 12 hours his stool has started to become more firm although is still soft he states.  He has not tried any over-the-counter antidiarrheal medications.  Patient states he drank approximately 40 ounces of fluids yesterday.  He was able to tolerate water and some juice.  He has had decreased appetite because of changes to his taste buds.  A lot of food makes him gag because of the taste.  Yesterday he was able to tolerate some oatmeal, string beans and spaghetti.     ROS  All other systems are reviewed and are negative for acute change except as noted in the HPI.    No Known Allergies   Past Medical History:  Diagnosis Date   Hepatitis C    s/p treatment   Hypertension    Obstructive sleep apnea    unable to tolerate CPAP   Premature ventricular contraction    Prostate cancer (HCC) 2006     Past Surgical History:  Procedure Laterality Date   LOOP RECORDER INSERTION N/A 07/04/2016   Procedure: Loop Recorder Insertion;  Surgeon: Hillis Range, MD;  Location: [facility] INVASIVE CV LAB;  Service: Cardiovascular;  Laterality: N/A;   prostectomy     SPLENECTOMY, TOTAL      Social History   Socioeconomic History   Marital status: Married    Spouse name: Not on file   Number of children: 3   Years of education: Not on file   Highest education level: Not on file  Occupational History   Not on file  Tobacco Use   Smoking status: Former    Packs/day: 1.00    Years: 40.00    Additional pack years: 0.00    Total pack years: 40.00    Types: Cigarettes    Quit date: 08/05/2022    Years since quitting: 0.1    Passive  exposure: Past   Smokeless tobacco: Never   Tobacco comments:    Smokes 1ppd. May not smoke them all, may throw some of them out.  Never smokes a whole one.  Vaping Use   Vaping Use: Never used  Substance and Sexual Activity   Alcohol use: Yes    Alcohol/week: 2.0 standard drinks of alcohol    Types: 2 Cans of beer per week    Comment: weekends   Drug use: No   Sexual activity: Not on file  Other Topics Concern   Not on file  Social History Narrative   Pt lives in [place] with spouse.  3 children.  Works as a Naval architect.   Social Determinants of Health   Financial Resource Strain: Not on file  Food Insecurity: Not on file  Transportation Needs:  Not on file  Physical Activity: Not on file  Stress: Not on file  Social Connections: Not on file  Intimate Partner Violence: Not on file    Family History  Problem Relation Age of Onset   Cancer Brother        colon cancer and prostate cancer   Heart attack Neg Hx      Current Outpatient Medications:    amLODipine (NORVASC) 5 MG tablet, Take 1 tablet (5 mg total) by mouth daily. Please make overdue appt with Dr. Johney Frame before anymore refills. 3rd and Final Attempt, Disp: 15 tablet, Rfl: 0   APIXABAN (ELIQUIS) VTE STARTER PACK (10MG  AND 5MG ), Take as directed on package: start with two-5mg  tablets twice daily for 7 days. On day 8, switch to one-5mg  tablet twice daily., Disp: 1 each, Rfl: 0   escitalopram (LEXAPRO) 10 MG tablet, Take 10 mg by mouth daily., Disp: , Rfl:    losartan (COZAAR) 100 MG tablet, Take 1 tablet (100 mg total) by mouth daily. Please make overdue appt with Dr. Johney Frame before anymore refills. 3rd and Final Attempt, Disp: 15 tablet, Rfl: 0   metoprolol succinate (TOPROL-XL) 100 MG 24 hr tablet, Take 1 tablet (100 mg total) by mouth daily. NEED TO SCHEDULE APPOINTMENT, Disp: 20 tablet, Rfl: 0   Multiple Vitamin (MULTIVITAMIN WITH MINERALS) TABS tablet, Take 1 tablet by mouth daily., Disp: , Rfl:    ondansetron  (ZOFRAN-ODT) 4 MG disintegrating tablet, Take 1 tablet (4 mg total) by mouth every 8 (eight) hours as needed for nausea or vomiting., Disp: 8 tablet, Rfl: 0   pantoprazole (PROTONIX) 40 MG tablet, Take 1 tablet (40 mg total) by mouth daily., Disp: 30 tablet, Rfl: 2   pravastatin (PRAVACHOL) 20 MG tablet, Take 20 mg by mouth daily., Disp: , Rfl:    prochlorperazine (COMPAZINE) 10 MG tablet, Take 1 tablet (10 mg total) by mouth every 6 (six) hours as needed for nausea or vomiting., Disp: 30 tablet, Rfl: 0   promethazine (PHENERGAN) 25 MG tablet, Take 1 tablet (25 mg total) by mouth every 8 (eight) hours as needed for nausea., Disp: 8 tablet, Rfl: 0   valACYclovir (VALTREX) 500 MG tablet, Take 1 tablet by mouth daily as needed. For cold sore flare ups, Disp: , Rfl:  No current facility-administered medications for this visit.  Facility-Administered Medications Ordered in Other Visits:    0.9 %  sodium chloride infusion, , Intravenous, Continuous, Walisiewicz, Jafeth Mustin E, PA-C, Stopped at 09/18/22 1309  PHYSICAL EXAM: ECOG FS:1 - Symptomatic but completely ambulatory    Vitals:   09/18/22 1039  BP: (!) 139/101  Pulse: (!) 101  Resp: 18  Temp: 98.4 F (36.9 C)  SpO2: 97%  Weight: 195 lb 9.6 oz (88.7 kg)   Physical Exam Vitals and nursing note reviewed.  Constitutional:      Appearance: He is not ill-appearing or toxic-appearing.  HENT:     Head: Normocephalic.     Nose: Nose normal. No congestion or rhinorrhea.     Mouth/Throat:     Mouth: Mucous membranes are moist.  Eyes:     Conjunctiva/sclera: Conjunctivae normal.  Cardiovascular:     Rate and Rhythm: Normal rate and regular rhythm.     Pulses: Normal pulses.     Heart sounds: Normal heart sounds.  Pulmonary:     Effort: Pulmonary effort is normal. No respiratory distress.     Breath sounds: Normal breath sounds. No stridor. No wheezing, rhonchi or rales.  Chest:  Chest wall: No tenderness.  Abdominal:     General:  Bowel sounds are normal. There is no distension.     Palpations: Abdomen is soft.     Tenderness: There is no abdominal tenderness. There is no guarding or rebound.  Musculoskeletal:     Cervical back: Normal range of motion.  Skin:    General: Skin is warm and dry.     Findings: No rash.  Neurological:     Mental Status: He is alert.        LABORATORY DATA: I have reviewed the data as listed    Latest Ref Rng & Units 09/18/2022   10:19 AM 09/12/2022   12:32 PM 09/02/2022   10:10 AM  CBC  WBC 4.0 - 10.5 K/uL 12.3  6.7  9.7   Hemoglobin 13.0 - 17.0 g/dL 16.1  09.6  04.5   Hematocrit 39.0 - 52.0 % 52.7  51.6  54.5   Platelets 150 - 400 K/uL 165  170  250         Latest Ref Rng & Units 09/18/2022   10:19 AM 09/12/2022   12:32 PM 09/02/2022   10:10 AM  CMP  Glucose 70 - 99 mg/dL 409  97  811   BUN 8 - 23 mg/dL 21  16  21    Creatinine 0.61 - 1.24 mg/dL 9.14  7.82  9.56   Sodium 135 - 145 mmol/L 134  137  133   Potassium 3.5 - 5.1 mmol/L 3.5  3.8  3.8   Chloride 98 - 111 mmol/L 101  103  102   CO2 22 - 32 mmol/L 24  26  22    Calcium 8.9 - 10.3 mg/dL 9.4  9.6  9.4   Total Protein 6.5 - 8.1 g/dL 8.1  8.1  7.9   Total Bilirubin 0.3 - 1.2 mg/dL 1.5  0.7  1.1   Alkaline Phos 38 - 126 U/L 117  117  94   AST 15 - 41 U/L 60  73  59   ALT 0 - 44 U/L 12  17  31         RADIOGRAPHIC STUDIES (from last 24 hours if applicable) I have personally reviewed the radiological images as listed and agreed with the findings in the report. No results found.      Visit Diagnosis: 1. Hepatocellular carcinoma (HCC)   2. Diarrhea, unspecified type   3. Acute cough   4. Nausea without vomiting      Orders Placed This Encounter  Procedures   Amb Referral to Palliative Care    Referral Priority:   Routine    Referral Type:   Consultation    Referral Reason:   Symptom Managment    Referred to Provider:   Glee Arvin, NP    Number of Visits Requested:   1    All  questions were answered. The patient knows to call the clinic with any problems, questions or concerns. No barriers to learning was detected.  A total of more than 20 minutes were spent on this encounter with face-to-face time and non-face-to-face time, including preparing to see the patient, ordering tests and/or medications, counseling the patient and coordination of care as outlined above.    Thank you for allowing me to participate in the care of this patient.    Shanon Ace, PA-C Department of Hematology/Oncology [facility] Phone: [phone]  [phone]    09/18/2022 3:28  PM   Addendum I have seen the patient, examined him. I agree with the assessment and and plan and have edited the notes.   Patient came in today for fatigue, nausea and low oral intake, likely related to his first treatment on Sep 12, 2022.  His cough is chronic, no clinical suspicion for pneumonia.  Will give him IV fluids and supportive care, and he will follow-up with [place] in 2 weeks.  He knows to call us if he does not recover well.  Malachy Mood MD 09/18/2022

## 2022-09-18 ENCOUNTER — Inpatient Hospital Stay: Payer: BC Managed Care – PPO

## 2022-09-18 ENCOUNTER — Inpatient Hospital Stay (HOSPITAL_BASED_OUTPATIENT_CLINIC_OR_DEPARTMENT_OTHER): Payer: BC Managed Care – PPO | Admitting: Physician Assistant

## 2022-09-18 VITALS — BP 139/101 | HR 101 | Temp 98.4°F | Resp 18 | Wt 195.6 lb

## 2022-09-18 VITALS — BP 161/105 | HR 82 | Temp 98.6°F | Resp 18 | Wt 195.4 lb

## 2022-09-18 DIAGNOSIS — R051 Acute cough: Secondary | ICD-10-CM | POA: Diagnosis not present

## 2022-09-18 DIAGNOSIS — E860 Dehydration: Secondary | ICD-10-CM

## 2022-09-18 DIAGNOSIS — C220 Liver cell carcinoma: Secondary | ICD-10-CM

## 2022-09-18 DIAGNOSIS — R110 Nausea: Secondary | ICD-10-CM

## 2022-09-18 DIAGNOSIS — R197 Diarrhea, unspecified: Secondary | ICD-10-CM | POA: Diagnosis not present

## 2022-09-18 DIAGNOSIS — Z5112 Encounter for antineoplastic immunotherapy: Secondary | ICD-10-CM | POA: Diagnosis not present

## 2022-09-18 LAB — CBC WITH DIFFERENTIAL (CANCER CENTER ONLY)
Abs Immature Granulocytes: 0.13 10*3/uL — ABNORMAL HIGH (ref 0.00–0.07)
Basophils Absolute: 0.2 10*3/uL — ABNORMAL HIGH (ref 0.0–0.1)
Basophils Relative: 1 %
Eosinophils Absolute: 0.1 10*3/uL (ref 0.0–0.5)
Eosinophils Relative: 1 %
HCT: 52.7 % — ABNORMAL HIGH (ref 39.0–52.0)
Hemoglobin: 18.2 g/dL — ABNORMAL HIGH (ref 13.0–17.0)
Immature Granulocytes: 1 %
Lymphocytes Relative: 21 %
Lymphs Abs: 2.6 10*3/uL (ref 0.7–4.0)
MCH: 28.4 pg (ref 26.0–34.0)
MCHC: 34.5 g/dL (ref 30.0–36.0)
MCV: 82.2 fL (ref 80.0–100.0)
Monocytes Absolute: 1.5 10*3/uL — ABNORMAL HIGH (ref 0.1–1.0)
Monocytes Relative: 13 %
Neutro Abs: 7.7 10*3/uL (ref 1.7–7.7)
Neutrophils Relative %: 63 %
Platelet Count: 165 10*3/uL (ref 150–400)
RBC: 6.41 MIL/uL — ABNORMAL HIGH (ref 4.22–5.81)
RDW: 16.3 % — ABNORMAL HIGH (ref 11.5–15.5)
Smear Review: NORMAL
WBC Count: 12.3 10*3/uL — ABNORMAL HIGH (ref 4.0–10.5)
nRBC: 0 % (ref 0.0–0.2)

## 2022-09-18 LAB — CMP (CANCER CENTER ONLY)
ALT: 12 U/L (ref 0–44)
AST: 60 U/L — ABNORMAL HIGH (ref 15–41)
Albumin: 3.5 g/dL (ref 3.5–5.0)
Alkaline Phosphatase: 117 U/L (ref 38–126)
Anion gap: 9 (ref 5–15)
BUN: 21 mg/dL (ref 8–23)
CO2: 24 mmol/L (ref 22–32)
Calcium: 9.4 mg/dL (ref 8.9–10.3)
Chloride: 101 mmol/L (ref 98–111)
Creatinine: 1.1 mg/dL (ref 0.61–1.24)
GFR, Estimated: >60 mL/min (ref >60)
Glucose, Bld: 107 mg/dL — ABNORMAL HIGH (ref 70–99)
Potassium: 3.5 mmol/L (ref 3.5–5.1)
Sodium: 134 mmol/L — ABNORMAL LOW (ref 135–145)
Total Bilirubin: 1.5 mg/dL — ABNORMAL HIGH (ref 0.3–1.2)
Total Protein: 8.1 g/dL (ref 6.5–8.1)

## 2022-09-18 LAB — MAGNESIUM: Magnesium: 1.9 mg/dL (ref 1.7–2.4)

## 2022-09-18 MED ORDER — SODIUM CHLORIDE 0.9 % IV SOLN: INTRAVENOUS | Status: DC

## 2022-09-18 MED ORDER — ONDANSETRON HCL 8 MG PO TABS: 4.0000 mg | ORAL_TABLET | Freq: Once | ORAL | Status: DC

## 2022-09-18 MED ORDER — ONDANSETRON HCL 4 MG/2ML IJ SOLN
4.0000 mg | Freq: Once | INTRAMUSCULAR | Status: AC
  Administered 2022-09-18: 4 mg via INTRAVENOUS

## 2022-09-20 ENCOUNTER — Other Ambulatory Visit (HOSPITAL_COMMUNITY): Payer: BC Managed Care – PPO

## 2022-09-20 ENCOUNTER — Ambulatory Visit (HOSPITAL_COMMUNITY)
Admission: RE | Admit: 2022-09-20 | Discharge: 2022-09-20 | Disposition: A | Discharge status: Home / Self Care | Payer: BC Managed Care – PPO | Source: Ambulatory Visit | Attending: Hematology | Admitting: Hematology

## 2022-09-20 DIAGNOSIS — C220 Liver cell carcinoma: Secondary | ICD-10-CM | POA: Diagnosis present

## 2022-09-20 MED ORDER — GADOBUTROL 1 MMOL/ML IV SOLN
8.8000 mL | Freq: Once | INTRAVENOUS | Status: AC | PRN
  Administered 2022-09-20: 8.8 mL via INTRAVENOUS

## 2022-09-24 NOTE — Progress Notes (Unsigned)
Palliative Medicine [facility]  Telephone:(336) [phone] Fax:(336) [phone]   Name: Mathew [name] Date: 09/24/2022 MRN: [id number]  DOB: Feb 25, 1951  Patient Care Team: Patient, No Pcp Per as PCP - General (General Practice) Malachy Mood, MD as Consulting Physician (Oncology)    REASON FOR CONSULTATION: Mathew [name] is a 72 y.o. male with oncologic medical history including hepatocellular carcinoma (08/2022) as well as idiopathic pulmonary fibrosis. Palliative ask to see for pain and symptom management and goals of care.    SOCIAL HISTORY:     reports that he quit smoking about 7 weeks ago. His smoking use included cigarettes. He has a 40.00 pack-year smoking history. He has been exposed to tobacco smoke. He has never used smokeless tobacco. He reports current alcohol use of about 2.0 standard drinks of alcohol per week. He reports that he does not use drugs.  ADVANCE DIRECTIVES:  None on file  CODE STATUS: Full code  PAST MEDICAL HISTORY: Past Medical History:  Diagnosis Date   Hepatitis C    s/p treatment   Hypertension    Obstructive sleep apnea    unable to tolerate CPAP   Premature ventricular contraction    Prostate cancer (HCC) 2006    PAST SURGICAL HISTORY:  Past Surgical History:  Procedure Laterality Date   LOOP RECORDER INSERTION N/A 07/04/2016   Procedure: Loop Recorder Insertion;  Surgeon: Hillis Range, MD;  [facility]: [facility];  Service: Cardiovascular;  Laterality: N/A;   prostectomy     SPLENECTOMY, TOTAL      HEMATOLOGY/ONCOLOGY HISTORY:  Oncology History Overview Note   Cancer Staging  Hepatocellular carcinoma (HCC) Staging form: Liver, AJCC 8th Edition - Clinical stage from 08/30/2022: Stage IVA (cT3, cN1, cM0) - Signed by Malachy Mood, MD on 09/03/2022 Stage prefix: Initial diagnosis     Hepatocellular carcinoma (HCC)  08/30/2022 Cancer Staging   Staging form: Liver, AJCC 8th Edition - Clinical stage from 08/30/2022: Stage  IVA (cT3, cN1, cM0) - Signed by Malachy Mood, MD on 09/03/2022 Stage prefix: Initial diagnosis   09/03/2022 Initial Diagnosis   Hepatocellular carcinoma (HCC)   09/10/2022 Imaging    IMPRESSION: No evidence of metastatic disease or other within the thorax.   Stable chronic interstitial lung disease.   Aortic Atherosclerosis (ICD10-I70.0).   09/12/2022 -  Chemotherapy   Patient is on Treatment Plan : LUNG Atezolizumab + Bevacizumab Maintenance q21d       ALLERGIES:  has No Known Allergies.  MEDICATIONS:  Current Outpatient Medications  Medication Sig Dispense Refill   amLODipine (NORVASC) 5 MG tablet Take 1 tablet (5 mg total) by mouth daily. Please make overdue appt with Dr. Johney Frame before anymore refills. 3rd and Final Attempt 15 tablet 0   APIXABAN (ELIQUIS) VTE STARTER PACK (10MG  AND 5MG ) Take as directed on package: start with two-5mg  tablets twice daily for 7 days. On day 8, switch to one-5mg  tablet twice daily. 1 each 0   escitalopram (LEXAPRO) 10 MG tablet Take 10 mg by mouth daily.     losartan (COZAAR) 100 MG tablet Take 1 tablet (100 mg total) by mouth daily. Please make overdue appt with Dr. Johney Frame before anymore refills. 3rd and Final Attempt 15 tablet 0   metoprolol succinate (TOPROL-XL) 100 MG 24 hr tablet Take 1 tablet (100 mg total) by mouth daily. NEED TO SCHEDULE APPOINTMENT 20 tablet 0   Multiple Vitamin (MULTIVITAMIN WITH MINERALS) TABS tablet Take 1 tablet by mouth daily.     ondansetron (  ZOFRAN-ODT) 4 MG disintegrating tablet Take 1 tablet (4 mg total) by mouth every 8 (eight) hours as needed for nausea or vomiting. 8 tablet 0   pantoprazole (PROTONIX) 40 MG tablet Take 1 tablet (40 mg total) by mouth daily. 30 tablet 2   pravastatin (PRAVACHOL) 20 MG tablet Take 20 mg by mouth daily.     prochlorperazine (COMPAZINE) 10 MG tablet Take 1 tablet (10 mg total) by mouth every 6 (six) hours as needed for nausea or vomiting. 30 tablet 0   promethazine (PHENERGAN) 25 MG tablet  Take 1 tablet (25 mg total) by mouth every 8 (eight) hours as needed for nausea. 8 tablet 0   valACYclovir (VALTREX) 500 MG tablet Take 1 tablet by mouth daily as needed. For cold sore flare ups     No current facility-administered medications for this visit.    VITAL SIGNS: There were no vitals taken for this visit. There were no vitals filed for this visit.  Estimated body mass index is 24.45 kg/m as calculated from the following:   Height as of 09/12/22: 6\' 3"  (1.905 m).   Weight as of 09/18/22: 195 lb 9.6 oz (88.7 kg).  LABS: CBC:    Component Value Date/Time   WBC 12.3 (H) 09/18/2022 1019   WBC 9.7 09/02/2022 1010   HGB 18.2 (H) 09/18/2022 1019   HCT 52.7 (H) 09/18/2022 1019   PLT 165 09/18/2022 1019   MCV 82.2 09/18/2022 1019   NEUTROABS 7.7 09/18/2022 1019   LYMPHSABS 2.6 09/18/2022 1019   MONOABS 1.5 (H) 09/18/2022 1019   EOSABS 0.1 09/18/2022 1019   BASOSABS 0.2 (H) 09/18/2022 1019   Comprehensive Metabolic Panel:    Component Value Date/Time   NA 134 (L) 09/18/2022 1019   K 3.5 09/18/2022 1019   CL 101 09/18/2022 1019   CO2 24 09/18/2022 1019   BUN 21 09/18/2022 1019   CREATININE 1.10 09/18/2022 1019   GLUCOSE 107 (H) 09/18/2022 1019   CALCIUM 9.4 09/18/2022 1019   AST 60 (H) 09/18/2022 1019   ALT 12 09/18/2022 1019   ALKPHOS 117 09/18/2022 1019   BILITOT 1.5 (H) 09/18/2022 1019   PROT 8.1 09/18/2022 1019   ALBUMIN 3.5 09/18/2022 1019    RADIOGRAPHIC STUDIES: MR LIVER W WO CONTRAST  Result Date: 09/21/2022 CLINICAL DATA:  Hepatocellular carcinoma staging EXAM: MRI ABDOMEN WITHOUT AND WITH CONTRAST TECHNIQUE: Multiplanar multisequence MR imaging of the abdomen was performed both before and after the administration of intravenous contrast. CONTRAST:  8.77mL GADAVIST GADOBUTROL 1 MMOL/ML IV SOLN COMPARISON:  CT abdomen pelvis, 08/19/2022 FINDINGS: Lower chest: No acute abnormality. Fibrotic changes in the bilateral lung bases, poorly assessed by MR.  Hepatobiliary: Unchanged large, heterogeneously enhancing, internally necrotic mass occupying the majority of the right lobe of the liver, measuring 14.0 x 12.8 cm (series 14, image 39). No other masses or suspicious contrast enhancement of the liver. No gallstones, gallbladder wall thickening, or biliary dilatation. Pancreas: Unremarkable. No pancreatic ductal dilatation or surrounding inflammatory changes. Spleen: Normal in size without significant abnormality. Adrenals/Urinary Tract: Adrenal glands are unremarkable. Poorly enhancing mass of the lateral midportion of the right kidney measuring 2.0 x 1.9 cm (series 12, image 68). Additional small poorly enhancing mass of the medial inferior pole of the left kidney measuring 0.9 x 0.7 cm (series 16, image 68). No hydronephrosis. Stomach/Bowel: Stomach is within normal limits. No evidence of bowel wall thickening, distention, or inflammatory changes. Vascular/Lymphatic: No significant vascular findings are present. No enlarged abdominal lymph  nodes. Other: No abdominal wall hernia or abnormality. No ascites. Musculoskeletal: No acute or significant osseous findings. IMPRESSION: 1. Unchanged large, heterogeneously enhancing, internally necrotic mass occupying the majority of the right lobe of the liver, measuring 14.0 x 12.8 cm, consistent with biopsy-proven hepatocellular carcinoma. No additional liver lesions or suspicious contrast enhancement. 2. Poorly enhancing mass of the lateral midportion of the right kidney measuring 2.0 x 1.9 cm. Additional small poorly enhancing mass of the medial inferior pole of the left kidney measuring 0.9 x 0.7 cm. 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 3. No evidence of lymphadenopathy or metastatic disease in the abdomen. Electronically Signed   By: Jearld Lesch M.D.   On: 09/21/2022 15:55   CT Chest Wo Contrast  Result Date: 09/10/2022 CLINICAL DATA:  Recently diagnosed hepatocellular carcinoma. Staging  EXAM: CT CHEST WITHOUT CONTRAST TECHNIQUE: Multidetector CT imaging of the chest was performed following the standard protocol without IV contrast. RADIATION DOSE REDUCTION: This exam was performed according to the departmental dose-optimization program which includes automated exposure control, adjustment of the mA and/or kV according to patient size and/or use of iterative reconstruction technique. COMPARISON:  04/05/2021 FINDINGS: Cardiovascular: No acute findings. Aortic atherosclerotic calcification incidentally noted. Mediastinum/Nodes: No masses or pathologically enlarged lymph nodes identified on this unenhanced exam. Shotty mediastinal lymph nodes remain stable. Lungs/Pleura: No suspicious nodules or masses identified. Chronic interstitial lung disease is again seen in the subpleural lung zones, without significant change. No evidence of acute infiltrate or pleural effusion. Upper Abdomen: Large low-attenuation right hepatic lobe mass is again seen, which is much better visualized on recent abdomen CT. Musculoskeletal:  No suspicious bone lesions. IMPRESSION: No evidence of metastatic disease or other within the thorax. Stable chronic interstitial lung disease. Aortic Atherosclerosis (ICD10-I70.0). Electronically Signed   By: Danae Orleans M.D.   On: 09/10/2022 10:17    PERFORMANCE STATUS (ECOG) : {CHL ONC ECOG ZO:[id number]}  Review of Systems Unless otherwise noted, a complete review of systems is negative.  Physical Exam General: NAD Cardiovascular: regular rate and rhythm Pulmonary: clear ant fields Abdomen: soft, nontender, + bowel sounds Extremities: no edema, no joint deformities Skin: no rashes Neurological:  IMPRESSION: *** I introduced myself, Maygan RN, and Palliative's role in collaboration with the oncology team. Concept of Palliative Care was introduced as specialized medical care for people and their families living with serious illness.  It focuses on providing relief from  the symptoms and stress of a serious illness.  The goal is to improve quality of life for both the patient and the family. Values and goals of care important to patient and family were attempted to be elicited.    We discussed *** current illness and what it means in the larger context of *** on-going co-morbidities. Natural disease trajectory and expectations were discussed.  I discussed the importance of continued conversation with family and their medical providers regarding overall plan of care and treatment options, ensuring decisions are within the context of the patients values and GOCs.  PLAN: Established therapeutic relationship. Education provided on palliative's role in collaboration with their Oncology/Radiation team. I will plan to see patient back in 2-4 weeks in collaboration to other oncology appointments.    Patient expressed understanding and was in agreement with this plan. He also understands that He can call the clinic at any time with any questions, concerns, or complaints.   Thank you for your referral and allowing Palliative to assist in Mathew [name]'s care.  Number and complexity of problems addressed: ***HIGH - 1 or more chronic illnesses with SEVERE exacerbation, progression, or side effects of treatment - advanced cancer, pain. Any controlled substances utilized were prescribed in the context of palliative care.   Visit consisted of counseling and education dealing with the complex and emotionally intense issues of symptom management and palliative care in the setting of serious and potentially life-threatening illness.Greater than 50%  of this time was spent counseling and coordinating care related to the above assessment and plan.  Signed by: Willette Alma, AGPCNP-BC Palliative Medicine Team/[facility]   *Please note that this is a verbal dictation therefore any spelling or grammatical errors are due to the "[employer] One"  system interpretation.

## 2022-09-26 ENCOUNTER — Inpatient Hospital Stay (HOSPITAL_BASED_OUTPATIENT_CLINIC_OR_DEPARTMENT_OTHER): Payer: BC Managed Care – PPO | Admitting: Nurse Practitioner

## 2022-09-26 ENCOUNTER — Encounter: Payer: Self-pay | Admitting: Nurse Practitioner

## 2022-09-26 ENCOUNTER — Other Ambulatory Visit: Payer: Self-pay

## 2022-09-26 VITALS — BP 150/106 | HR 101 | Temp 98.4°F | Resp 17 | Wt 194.1 lb

## 2022-09-26 DIAGNOSIS — R530 Neoplastic (malignant) related fatigue: Secondary | ICD-10-CM

## 2022-09-26 DIAGNOSIS — R110 Nausea: Secondary | ICD-10-CM

## 2022-09-26 DIAGNOSIS — G4709 Other insomnia: Secondary | ICD-10-CM

## 2022-09-26 DIAGNOSIS — Z515 Encounter for palliative care: Secondary | ICD-10-CM | POA: Diagnosis not present

## 2022-09-26 DIAGNOSIS — C220 Liver cell carcinoma: Secondary | ICD-10-CM

## 2022-09-26 DIAGNOSIS — K117 Disturbances of salivary secretion: Secondary | ICD-10-CM

## 2022-09-26 DIAGNOSIS — Z7189 Other specified counseling: Secondary | ICD-10-CM

## 2022-09-26 MED ORDER — METOCLOPRAMIDE HCL 10 MG PO TABS
10.0000 mg | ORAL_TABLET | Freq: Three times a day (TID) | ORAL | 1 refills | Status: DC
Start: 2022-09-26 — End: 2022-10-29

## 2022-09-26 MED ORDER — SCOPOLAMINE 1 MG/3DAYS TD PT72SCOPOLAMINE 1 MG/3DAYS
1.0000 | MEDICATED_PATCH | TRANSDERMAL | 12 refills | Status: DC
Start: 2022-09-26 — End: 2022-11-29

## 2022-10-01 ENCOUNTER — Encounter: Payer: Self-pay | Admitting: Physician Assistant

## 2022-10-02 ENCOUNTER — Inpatient Hospital Stay: Payer: BC Managed Care – PPO

## 2022-10-02 ENCOUNTER — Inpatient Hospital Stay: Payer: BC Managed Care – PPO | Admitting: Hematology

## 2022-10-02 ENCOUNTER — Inpatient Hospital Stay (HOSPITAL_BASED_OUTPATIENT_CLINIC_OR_DEPARTMENT_OTHER): Payer: BC Managed Care – PPO | Admitting: Nurse Practitioner

## 2022-10-02 ENCOUNTER — Encounter: Payer: Self-pay | Admitting: Nurse Practitioner

## 2022-10-02 VITALS — BP 144/99 | HR 91

## 2022-10-02 DIAGNOSIS — C220 Liver cell carcinoma: Secondary | ICD-10-CM | POA: Diagnosis not present

## 2022-10-02 DIAGNOSIS — K117 Disturbances of salivary secretion: Secondary | ICD-10-CM

## 2022-10-02 DIAGNOSIS — R630 Anorexia: Secondary | ICD-10-CM | POA: Diagnosis not present

## 2022-10-02 DIAGNOSIS — Z515 Encounter for palliative care: Secondary | ICD-10-CM

## 2022-10-02 DIAGNOSIS — R634 Abnormal weight loss: Secondary | ICD-10-CM | POA: Diagnosis not present

## 2022-10-02 DIAGNOSIS — R530 Neoplastic (malignant) related fatigue: Secondary | ICD-10-CM

## 2022-10-02 DIAGNOSIS — R110 Nausea: Secondary | ICD-10-CM

## 2022-10-02 DIAGNOSIS — Z5112 Encounter for antineoplastic immunotherapy: Secondary | ICD-10-CM | POA: Diagnosis not present

## 2022-10-02 LAB — CMP (CANCER CENTER ONLY)
ALT: 18 U/L (ref 0–44)
AST: 64 U/L — ABNORMAL HIGH (ref 15–41)
Albumin: 3.2 g/dL — ABNORMAL LOW (ref 3.5–5.0)
Alkaline Phosphatase: 167 U/L — ABNORMAL HIGH (ref 38–126)
Anion gap: 8 (ref 5–15)
BUN: 16 mg/dL (ref 8–23)
CO2: 25 mmol/L (ref 22–32)
Calcium: 9.5 mg/dL (ref 8.9–10.3)
Chloride: 103 mmol/L (ref 98–111)
Creatinine: 0.95 mg/dL (ref 0.61–1.24)
GFR, Estimated: >60 mL/min (ref >60)
Glucose, Bld: 76 mg/dL (ref 70–99)
Potassium: 3.4 mmol/L — ABNORMAL LOW (ref 3.5–5.1)
Sodium: 136 mmol/L (ref 135–145)
Total Bilirubin: 0.8 mg/dL (ref 0.3–1.2)
Total Protein: 7.9 g/dL (ref 6.5–8.1)

## 2022-10-02 LAB — CBC WITH DIFFERENTIAL (CANCER CENTER ONLY)
Abs Immature Granulocytes: 0.06 10*3/uL (ref 0.00–0.07)
Basophils Absolute: 0.2 10*3/uL — ABNORMAL HIGH (ref 0.0–0.1)
Basophils Relative: 2 %
Eosinophils Absolute: 0.3 10*3/uL (ref 0.0–0.5)
Eosinophils Relative: 3 %
HCT: 52.2 % — ABNORMAL HIGH (ref 39.0–52.0)
Hemoglobin: 18 g/dL — ABNORMAL HIGH (ref 13.0–17.0)
Immature Granulocytes: 1 %
Lymphocytes Relative: 36 %
Lymphs Abs: 3.3 10*3/uL (ref 0.7–4.0)
MCH: 28.6 pg (ref 26.0–34.0)
MCHC: 34.5 g/dL (ref 30.0–36.0)
MCV: 82.9 fL (ref 80.0–100.0)
Monocytes Absolute: 1.3 10*3/uL — ABNORMAL HIGH (ref 0.1–1.0)
Monocytes Relative: 15 %
Neutro Abs: 4 10*3/uL (ref 1.7–7.7)
Neutrophils Relative %: 43 %
Platelet Count: 285 10*3/uL (ref 150–400)
RBC: 6.3 MIL/uL — ABNORMAL HIGH (ref 4.22–5.81)
RDW: 18.1 % — ABNORMAL HIGH (ref 11.5–15.5)
Smear Review: NORMAL
WBC Count: 9.2 10*3/uL (ref 4.0–10.5)
nRBC: 0 % (ref 0.0–0.2)

## 2022-10-02 MED ORDER — SODIUM CHLORIDE 0.9 % IV SOLN
15.0000 mg/kg | Freq: Once | INTRAVENOUS | Status: AC
  Administered 2022-10-02: 1400 mg via INTRAVENOUS
  Filled 2022-10-02: qty 8

## 2022-10-02 MED ORDER — SODIUM CHLORIDE 0.9 % IV SOLN
1200.0000 mg | Freq: Once | INTRAVENOUS | Status: AC
  Administered 2022-10-02: 1200 mg via INTRAVENOUS
  Filled 2022-10-02: qty 20

## 2022-10-02 MED ORDER — SODIUM CHLORIDE 0.9% FLUSH: 10.0000 mL | INTRAVENOUS | Status: DC | PRN

## 2022-10-02 MED ORDER — HEPARIN SOD (PORK) LOCK FLUSH 100 UNIT/ML IV SOLN: 500.0000 [IU] | Freq: Once | INTRAVENOUS | Status: DC | PRN

## 2022-10-02 MED ORDER — SODIUM CHLORIDE 0.9 % IV SOLN: Freq: Once | INTRAVENOUS | Status: AC

## 2022-10-02 NOTE — Patient Instructions (Signed)
[facility]  Discharge Instructions: Thank you for choosing [facility] to provide your oncology and hematology care.   If you have a lab appointment with the Cancer Center, please go directly to the Cancer Center and check in at the registration area.   Wear comfortable clothing and clothing appropriate for easy access to any Portacath or PICC line.   We strive to give you quality time with your provider. You may need to reschedule your appointment if you arrive late (15 or more minutes).  Arriving late affects you and other patients whose appointments are after yours.  Also, if you miss three or more appointments without notifying the office, you may be dismissed from the clinic at the provider's discretion.      For prescription refill requests, have your pharmacy contact our office and allow 72 hours for refills to be completed.    Today you received the following chemotherapy and/or immunotherapy agents: Tecentriq, Mvasi.       To help prevent nausea and vomiting after your treatment, we encourage you to take your nausea medication as directed.  BELOW ARE SYMPTOMS THAT SHOULD BE REPORTED IMMEDIATELY: *FEVER GREATER THAN 100.4 F (38 C) OR HIGHER *CHILLS OR SWEATING *NAUSEA AND VOMITING THAT IS NOT CONTROLLED WITH YOUR NAUSEA MEDICATION *UNUSUAL SHORTNESS OF BREATH *UNUSUAL BRUISING OR BLEEDING *URINARY PROBLEMS (pain or burning when urinating, or frequent urination) *BOWEL PROBLEMS (unusual diarrhea, constipation, pain near the anus) TENDERNESS IN MOUTH AND THROAT WITH OR WITHOUT PRESENCE OF ULCERS (sore throat, sores in mouth, or a toothache) UNUSUAL RASH, SWELLING OR PAIN  UNUSUAL VAGINAL DISCHARGE OR ITCHING   Items with * indicate a potential emergency and should be followed up as soon as possible or go to the Emergency Department if any problems should occur.  Please show the CHEMOTHERAPY ALERT CARD or IMMUNOTHERAPY ALERT CARD  at check-in to the Emergency Department and triage nurse.  Should you have questions after your visit or need to cancel or reschedule your appointment, please contact [facility]  Dept: [phone]  and follow the prompts.  Office hours are 8:00 a.m. to 4:30 p.m. Monday - Friday. Please note that voicemails left after 4:00 p.m. may not be returned until the following business day.  We are closed weekends and major holidays. You have access to a nurse at all times for urgent questions. Please call the main number to the clinic Dept: [phone] and follow the prompts.   For any non-urgent questions, you may also contact your provider using MyChart. We now offer e-Visits for anyone 18 and older to request care online for non-urgent symptoms. For details visit [email or web address].   Also download the MyChart app! Go to the app store, search "MyChart", open the app, select [facility], and log in with your MyChart username and password.   

## 2022-10-02 NOTE — Progress Notes (Unsigned)
[facility]   Telephone:(336) [phone] Fax:(336) [phone]   Clinic Follow up Note   Patient Care Team: Patient, No Pcp Per as PCP - General (General Practice) Mathew Mood, MD as Consulting Physician (Oncology)  Date of Service:  10/03/2022  CHIEF COMPLAINT: f/u of Hepatocellular carcinoma (HCC)   CURRENT THERAPY:  First line Atezolizumab+ Bevacizumab  q21d  ASSESSMENT:  Mathew [name] is a 72 y.o. male with    Hepatocellular carcinoma (HCC) -cT3N1M0 stage IVA -presented with abdominal bloating, nausea and diarrhea satiety.  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  He is not a candidate for liver transplant due to the large size of his tumor.  I doubt he is a candidate for upfront surgery -I recommend systemic treatment given the high tumor burden and high AFP -I recommend first-line therapy with Atezo and bevacizumab. Plan to start today -His case was discussed in GI tumor board.  Although his liver cancer is technically resectable, due to the extremely high AFP, his risk of recurrence is extremely high.  We recommend systemic therapy, and possible bland embolization, to see how he responds.   -I reviewed his recent liver MRI scan images, and discussed the findings with patient.  Showed stable large tumor in the right lobe, no additional lesions in the liver.   -I will send a message to pancreatobiliary surgeon Dr. Donell [name] and Dr. Freida [name].  Due to patient's significant symptoms from his large tumor burden, I recommend Y90 in addition to systemic treatment to better control his disease, before surgery can be considered. -He is tolerating treatment  moderately well, does complain taste change and fatigue, his weight is stable, will continue current therapy.  Discussed other alternatives, such as Leventinib if he is not able to tolerate current treatment.   IPF (idiopathic pulmonary fibrosis) (HCC) -f/u with pulmonary      PLAN: -lab reviewed - discuss Liver MR- stable -Discuss Y90 with pt as an option if the treatment is to hard. Oral Chemo pill - reach out to Dr. Freida [name] and Dr. Donell [name] -Continue  C2 Mathew [name] and Beva  today -lab/flush and treatment 6/19  SUMMARY OF ONCOLOGIC HISTORY: Oncology History Overview Note   Cancer Staging  Hepatocellular carcinoma [facility]) Staging form: Liver, AJCC 8th Edition - Clinical stage from 08/30/2022: Stage IVA (cT3, cN1, cM0) - Signed by Mathew Mood, MD on 09/03/2022 Stage prefix: Initial diagnosis     Hepatocellular carcinoma (HCC)  08/30/2022 Cancer Staging   Staging form: Liver, AJCC 8th Edition - Clinical stage from 08/30/2022: Stage IVA (cT3, cN1, cM0) - Signed by Mathew Mood, MD on 09/03/2022 Stage prefix: Initial diagnosis   09/03/2022 Initial Diagnosis   Hepatocellular carcinoma (HCC)   09/10/2022 Imaging    IMPRESSION: No evidence of metastatic disease or other within the thorax.   Stable chronic interstitial lung disease.   Aortic Atherosclerosis (ICD10-I70.0).   09/12/2022 -  Chemotherapy   Patient is on Treatment Plan : LUNG Atezolizumab + Bevacizumab Maintenance q21d        INTERVAL HISTORY:  Mathew [name] is here for a follow up of Hepatocellular carcinoma (HCC). He was last seen by  me on 09/12/2022. He  presents to the clinic accompanied by wife. Pt stated that he smell and taste metallic and it started after the treatment. Pt state that he has not been eating well since the tate change. Pt states that  he only drinks 1 bottle of Ensure, it doesn't make him nauseous. Pt  reports of having SOB when he climb stairs. Pt state that he has a lot of saliva build in his mouth which causes him  to spit. Pt nausea has improved.    All other systems were reviewed with the patient and are negative.  MEDICAL HISTORY:  Past Medical History:  Diagnosis Date   Hepatitis C    s/p treatment   Hypertension    Obstructive sleep apnea    unable to tolerate CPAP   Premature ventricular contraction    Prostate cancer (HCC) 2006    SURGICAL HISTORY: Past Surgical History:  Procedure Laterality Date   LOOP RECORDER INSERTION N/A 07/04/2016   Procedure: Loop Recorder Insertion;  Surgeon: Mathew Range, MD;  Location: [facility];  Service: Cardiovascular;  Laterality: N/A;   prostectomy     SPLENECTOMY, TOTAL      I have reviewed the social history and family history with the patient and they are unchanged from previous note.  ALLERGIES:  has No Known Allergies.  MEDICATIONS:  Current Outpatient Medications  Medication Sig Dispense Refill   amLODipine (NORVASC) 5 MG tablet Take 1 tablet (5 mg total) by mouth daily. Please make overdue appt with Dr. Johney [name] before anymore refills. 3rd and Final Attempt 15 tablet 0   APIXABAN (ELIQUIS) VTE STARTER PACK (10MG  AND 5MG ) Take as directed on package: start with two-5mg  tablets twice daily for 7 days. On day 8, switch to one-5mg  tablet twice daily. 1 each 0   escitalopram (LEXAPRO) 10 MG tablet Take 10 mg by mouth daily.     losartan (COZAAR) 100 MG tablet Take 1 tablet (100 mg total) by mouth daily. Please make overdue appt with Dr. Johney [name] before anymore refills. 3rd and Final Attempt 15 tablet 0   metoCLOPramide (REGLAN) 10 MG tablet Take 1 tablet (10 mg total) by mouth 4 (four) times daily -  before meals and at bedtime. 60 tablet 1   metoprolol succinate (TOPROL-XL) 100 MG 24 hr tablet Take 1 tablet (100 mg total) by mouth daily. NEED TO SCHEDULE APPOINTMENT 20 tablet 0   Multiple Vitamin (MULTIVITAMIN WITH MINERALS) TABS tablet Take 1 tablet by mouth daily.     ondansetron (ZOFRAN-ODT) 4 MG disintegrating tablet Take 1 tablet (4 mg  total) by mouth every 8 (eight) hours as needed for nausea or vomiting. 8 tablet 0   pantoprazole (PROTONIX) 40 MG tablet Take 1 tablet (40 mg total) by mouth daily. 30 tablet 2   pravastatin (PRAVACHOL) 20 MG tablet Take 20 mg by mouth daily.     prochlorperazine (COMPAZINE) 10 MG tablet Take 1 tablet (10 mg total) by mouth every 6 (six) hours as needed for nausea or vomiting. 30 tablet 0   promethazine (PHENERGAN) 25 MG tablet Take 1 tablet (25 mg total) by mouth every 8 (eight) hours as needed for nausea. 8 tablet 0   scopolamine (TRANSDERM-SCOP) 1 MG/3DAYS Place 1 patch (1.5 mg total) onto the skin every 3 (three) days. 10 patch 12   valACYclovir (VALTREX) 500 MG tablet Take 1 tablet by mouth daily as needed. For cold sore flare ups     No current facility-administered medications for this visit.  PHYSICAL EXAMINATION: ECOG PERFORMANCE STATUS: 2 - Symptomatic, <50% confined to bed  Vitals:   10/02/22 1455  BP: (!) 143/107  Pulse: (!) 50  Resp: 16  Temp: 98.6 F (37 C)  SpO2: 100%   Wt Readings from Last 3 Encounters:  10/02/22 192 lb 12.8 oz (87.5 kg)  09/26/22 194 lb 1.6 oz (88 kg)  09/18/22 195 lb 6 oz (88.6 kg)     GENERAL:alert, no distress and comfortable SKIN: skin color normal, no rashes or significant lesions EYES: normal, Conjunctiva are pink and non-injected, sclera clear  NEURO: alert & oriented x 3 with fluent speech LUNGS:(-) clear to auscultation and percussion with normal breathing effort HEART: (-) regular rate & rhythm and no murmurs and no lower extremity edema ABDOMEN:(-)abdomen soft, (+)tender  in the right upper quadrant and normal bowel sounds  LABORATORY DATA:  I have reviewed the data as listed    Latest Ref Rng & Units 10/02/2022    2:20 PM 09/18/2022   10:19 AM 09/12/2022   12:32 PM  CBC  WBC 4.0 - 10.5 K/uL 9.2  12.3  6.7   Hemoglobin 13.0 - 17.0 g/dL 09.8  11.9  14.7   Hematocrit 39.0 - 52.0 % 52.2  52.7  51.6   Platelets 150 - 400 K/uL  285  165  170         Latest Ref Rng & Units 10/02/2022    2:20 PM 09/18/2022   10:19 AM 09/12/2022   12:32 PM  CMP  Glucose 70 - 99 mg/dL 76  829  97   BUN 8 - 23 mg/dL 16  21  16    Creatinine 0.61 - 1.24 mg/dL 5.62  1.30  8.65   Sodium 135 - 145 mmol/L 136  134  137   Potassium 3.5 - 5.1 mmol/L 3.4  3.5  3.8   Chloride 98 - 111 mmol/L 103  101  103   CO2 22 - 32 mmol/L 25  24  26    Calcium 8.9 - 10.3 mg/dL 9.5  9.4  9.6   Total Protein 6.5 - 8.1 g/dL 7.9  8.1  8.1   Total Bilirubin 0.3 - 1.2 mg/dL 0.8  1.5  0.7   Alkaline Phos 38 - 126 U/L 167  117  117   AST 15 - 41 U/L 64  60  73   ALT 0 - 44 U/L 18  12  17        RADIOGRAPHIC STUDIES: I have personally reviewed the radiological images as listed and agreed with the findings in the report. No results found.    No orders of the defined types were placed in this encounter.  All questions were answered. The patient knows to call the clinic with any problems, questions or concerns. No barriers to learning was detected. The total time spent in the appointment was 30 minutes.     Mathew Mood, MD 10/03/2022   Carolin Coy, CMA, am acting as scribe for Mathew Mood, MD.   I have reviewed the above documentation for accuracy and completeness, and I agree with the above.

## 2022-10-02 NOTE — Progress Notes (Signed)
Palliative Medicine [facility]  [phone]) [phone] [phone]) [phone]   Name: Mathew [name] Date: 10/02/2022 MRN: [id number]  DOB: 1951/02/29  Patient Care Team: Patient, No Pcp Per as PCP - General (General Practice) Mathew Mood, MD as Consulting Physician (Oncology)    INTERVAL HISTORY: Mathew [name] is a 72 y.o. male with  oncologic medical history including hepatocellular carcinoma (08/2022) as well as idiopathic pulmonary fibrosis. Palliative ask to see for pain and symptom management and goals of care.   SOCIAL HISTORY:     reports that he quit smoking about 8 weeks ago. His smoking use included cigarettes. He has a 40.00 pack-year smoking history. He has been exposed to tobacco smoke. He has never used smokeless tobacco. He reports current alcohol use of about 2.0 standard drinks of alcohol per week. He reports that he does not use drugs.  ADVANCE DIRECTIVES:  None on file  CODE STATUS: Full code  PAST MEDICAL HISTORY: Past Medical History:  Diagnosis Date   Hepatitis C    s/p treatment   Hypertension    Obstructive sleep apnea    unable to tolerate CPAP   Premature ventricular contraction    Prostate cancer (HCC) 2006    ALLERGIES:  has No Known Allergies.  MEDICATIONS:  Current Outpatient Medications  Medication Sig Dispense Refill   amLODipine (NORVASC) 5 MG tablet Take 1 tablet (5 mg total) by mouth daily. Please make overdue appt with Dr. Johney Frame before anymore refills. 3rd and Final Attempt 15 tablet 0   APIXABAN (ELIQUIS) VTE STARTER PACK (10MG  AND 5MG ) Take as directed on package: start with two-5mg  tablets twice daily for 7 days. On day 8, switch to one-5mg  tablet twice daily. 1 each 0   escitalopram (LEXAPRO) 10 MG tablet Take 10 mg by mouth daily.     losartan (COZAAR) 100 MG tablet Take 1 tablet (100 mg total) by mouth daily. Please make overdue appt with Dr. Johney Frame before anymore refills. 3rd and Final Attempt 15 tablet 0    metoCLOPramide (REGLAN) 10 MG tablet Take 1 tablet (10 mg total) by mouth 4 (four) times daily -  before meals and at bedtime. 60 tablet 1   metoprolol succinate (TOPROL-XL) 100 MG 24 hr tablet Take 1 tablet (100 mg total) by mouth daily. NEED TO SCHEDULE APPOINTMENT 20 tablet 0   Multiple Vitamin (MULTIVITAMIN WITH MINERALS) TABS tablet Take 1 tablet by mouth daily.     ondansetron (ZOFRAN-ODT) 4 MG disintegrating tablet Take 1 tablet (4 mg total) by mouth every 8 (eight) hours as needed for nausea or vomiting. 8 tablet 0   pantoprazole (PROTONIX) 40 MG tablet Take 1 tablet (40 mg total) by mouth daily. 30 tablet 2   pravastatin (PRAVACHOL) 20 MG tablet Take 20 mg by mouth daily.     prochlorperazine (COMPAZINE) 10 MG tablet Take 1 tablet (10 mg total) by mouth every 6 (six) hours as needed for nausea or vomiting. 30 tablet 0   promethazine (PHENERGAN) 25 MG tablet Take 1 tablet (25 mg total) by mouth every 8 (eight) hours as needed for nausea. 8 tablet 0   scopolamine (TRANSDERM-SCOP) 1 MG/3DAYS Place 1 patch (1.5 mg total) onto the skin every 3 (three) days. 10 patch 12   valACYclovir (VALTREX) 500 MG tablet Take 1 tablet by mouth daily as needed. For cold sore flare ups     No current facility-administered medications for this visit.    VITAL SIGNS: There were no vitals taken  for this visit. There were no vitals filed for this visit.  Estimated body mass index is 24.26 kg/m as calculated from the following:   Height as of 09/12/22: 6\' 3"  (1.905 m).   Weight as of 09/26/22: 194 lb 1.6 oz (88 kg).   PERFORMANCE STATUS (ECOG) : 1 - Symptomatic but completely ambulatory   Physical Exam General: NAD Cardiovascular: regular rate and rhythm Pulmonary: normal breathing pattern  Abdomen: soft, nontender, + bowel sounds Extremities: no edema, no joint deformities Skin: no rashes Neurological: AAO x4   IMPRESSION: I saw Mr. Hatter during his infusion. Wife is present. Tolerating treatment.  Continues to endorse significant fatigue and metallic taste. He is not very active due to his ongoing fatigue.   He is trying to focus on his nutrition however appetite remains minimal due to taste changes. Continues to have saliva buildup however he was only recently able to get medications. He is now wearing scopolamine patch and taking reglan for support. No noticeable difference as of yet. He is concerned about how he will tolerate this second treatment and any potential side effects. Shared possible Y90 as discussed with Dr. Mosetta Putt if he does not tolerate. We will continue to closely monitor and support for symptom management and goals of care needs.   Denies nausea, vomiting, constipation, or diarrhea.   I discussed the importance of continued conversation with family and their medical providers regarding overall plan of care and treatment options, ensuring decisions are within the context of the patients values and GOCs.  PLAN: Reglan before meals and at bedtime Scopolamine patch every 72 hours Ongoing symptom management support Continue antiemetics as needed I will plan to see patient back in 1-2 weeks in collaboration to other oncology appointments.    Patient expressed understanding and was in agreement with this plan. He also understands that He can call the clinic at any time with any questions, concerns, or complaints.   Any controlled substances utilized were prescribed in the context of palliative care. PDMP has been reviewed.    Visit consisted of counseling and education dealing with the complex and emotionally intense issues of symptom management and palliative care in the setting of serious and potentially life-threatening illness.Greater than 50%  of this time was spent counseling and coordinating care related to the above assessment and plan.  Mathew [name], AGPCNP-BC  Palliative Medicine Team/[facility]  *Please note that this is a verbal  dictation therefore any spelling or grammatical errors are due to the "[employer]agon [employer] One" system interpretation.

## 2022-10-03 ENCOUNTER — Encounter: Payer: Self-pay | Admitting: Hematology

## 2022-10-10 LAB — AFP TUMOR MARKER

## 2022-10-11 NOTE — Progress Notes (Signed)
Completed Critical Illness form mailed to Patient. Unable to reach patient by phone or leave a message. Fax transmission confirmation received from [employer].

## 2022-10-23 ENCOUNTER — Encounter: Payer: Self-pay | Admitting: Hematology

## 2022-10-23 ENCOUNTER — Inpatient Hospital Stay: Payer: BC Managed Care – PPO | Attending: Hematology

## 2022-10-23 ENCOUNTER — Inpatient Hospital Stay: Payer: BC Managed Care – PPO

## 2022-10-23 ENCOUNTER — Other Ambulatory Visit: Payer: Self-pay

## 2022-10-23 ENCOUNTER — Telehealth: Payer: Self-pay

## 2022-10-23 ENCOUNTER — Other Ambulatory Visit: Payer: Self-pay | Admitting: Hematology

## 2022-10-23 ENCOUNTER — Inpatient Hospital Stay (HOSPITAL_BASED_OUTPATIENT_CLINIC_OR_DEPARTMENT_OTHER): Payer: BC Managed Care – PPO | Admitting: Hematology

## 2022-10-23 VITALS — BP 137/103 | HR 108 | Temp 98.5°F | Resp 18 | Ht 75.0 in | Wt 185.7 lb

## 2022-10-23 VITALS — BP 150/106 | HR 88 | Resp 18

## 2022-10-23 DIAGNOSIS — G4733 Obstructive sleep apnea (adult) (pediatric): Secondary | ICD-10-CM | POA: Diagnosis present

## 2022-10-23 DIAGNOSIS — Z66 Do not resuscitate: Secondary | ICD-10-CM | POA: Diagnosis present

## 2022-10-23 DIAGNOSIS — Z800 Family history of malignant neoplasm of digestive organs: Secondary | ICD-10-CM

## 2022-10-23 DIAGNOSIS — I10 Essential (primary) hypertension: Secondary | ICD-10-CM | POA: Diagnosis present

## 2022-10-23 DIAGNOSIS — J84112 Idiopathic pulmonary fibrosis: Secondary | ICD-10-CM | POA: Insufficient documentation

## 2022-10-23 DIAGNOSIS — C7801 Secondary malignant neoplasm of right lung: Secondary | ICD-10-CM | POA: Diagnosis present

## 2022-10-23 DIAGNOSIS — Z5112 Encounter for antineoplastic immunotherapy: Secondary | ICD-10-CM | POA: Insufficient documentation

## 2022-10-23 DIAGNOSIS — I700 Atherosclerosis of aorta: Secondary | ICD-10-CM | POA: Insufficient documentation

## 2022-10-23 DIAGNOSIS — Z7901 Long term (current) use of anticoagulants: Secondary | ICD-10-CM

## 2022-10-23 DIAGNOSIS — I71012 Dissection of descending thoracic aorta: Principal | ICD-10-CM | POA: Diagnosis present

## 2022-10-23 DIAGNOSIS — Z515 Encounter for palliative care: Secondary | ICD-10-CM

## 2022-10-23 DIAGNOSIS — C220 Liver cell carcinoma: Secondary | ICD-10-CM

## 2022-10-23 DIAGNOSIS — E860 Dehydration: Secondary | ICD-10-CM | POA: Diagnosis present

## 2022-10-23 DIAGNOSIS — Z79899 Other long term (current) drug therapy: Secondary | ICD-10-CM

## 2022-10-23 DIAGNOSIS — F17210 Nicotine dependence, cigarettes, uncomplicated: Secondary | ICD-10-CM | POA: Diagnosis present

## 2022-10-23 DIAGNOSIS — Z79620 Long term (current) use of immunosuppressive biologic: Secondary | ICD-10-CM | POA: Insufficient documentation

## 2022-10-23 DIAGNOSIS — Z8249 Family history of ischemic heart disease and other diseases of the circulatory system: Secondary | ICD-10-CM

## 2022-10-23 DIAGNOSIS — N179 Acute kidney failure, unspecified: Secondary | ICD-10-CM | POA: Diagnosis present

## 2022-10-23 DIAGNOSIS — Z9079 Acquired absence of other genital organ(s): Secondary | ICD-10-CM

## 2022-10-23 DIAGNOSIS — Z8546 Personal history of malignant neoplasm of prostate: Secondary | ICD-10-CM

## 2022-10-23 DIAGNOSIS — J849 Interstitial pulmonary disease, unspecified: Secondary | ICD-10-CM | POA: Diagnosis present

## 2022-10-23 DIAGNOSIS — Z85528 Personal history of other malignant neoplasm of kidney: Secondary | ICD-10-CM

## 2022-10-23 DIAGNOSIS — I480 Paroxysmal atrial fibrillation: Secondary | ICD-10-CM | POA: Diagnosis present

## 2022-10-23 DIAGNOSIS — E871 Hypo-osmolality and hyponatremia: Secondary | ICD-10-CM | POA: Diagnosis present

## 2022-10-23 DIAGNOSIS — I81 Portal vein thrombosis: Secondary | ICD-10-CM | POA: Diagnosis present

## 2022-10-23 DIAGNOSIS — B1920 Unspecified viral hepatitis C without hepatic coma: Secondary | ICD-10-CM | POA: Diagnosis present

## 2022-10-23 DIAGNOSIS — Z8042 Family history of malignant neoplasm of prostate: Secondary | ICD-10-CM

## 2022-10-23 DIAGNOSIS — C7802 Secondary malignant neoplasm of left lung: Secondary | ICD-10-CM | POA: Diagnosis present

## 2022-10-23 DIAGNOSIS — Z9081 Acquired absence of spleen: Secondary | ICD-10-CM

## 2022-10-23 LAB — CBC WITH DIFFERENTIAL (CANCER CENTER ONLY)
Abs Immature Granulocytes: 0.02 10*3/uL (ref 0.00–0.07)
Basophils Absolute: 0.2 10*3/uL — ABNORMAL HIGH (ref 0.0–0.1)
Basophils Relative: 3 %
Eosinophils Absolute: 0.1 10*3/uL (ref 0.0–0.5)
Eosinophils Relative: 2 %
HCT: 55.2 % — ABNORMAL HIGH (ref 39.0–52.0)
Hemoglobin: 18.4 g/dL — ABNORMAL HIGH (ref 13.0–17.0)
Immature Granulocytes: 0 %
Lymphocytes Relative: 40 %
Lymphs Abs: 3 10*3/uL (ref 0.7–4.0)
MCH: 27 pg (ref 26.0–34.0)
MCHC: 33.3 g/dL (ref 30.0–36.0)
MCV: 81.1 fL (ref 80.0–100.0)
Monocytes Absolute: 0.7 10*3/uL (ref 0.1–1.0)
Monocytes Relative: 10 %
Neutro Abs: 3.5 10*3/uL (ref 1.7–7.7)
Neutrophils Relative %: 45 %
Platelet Count: 238 10*3/uL (ref 150–400)
RBC: 6.81 MIL/uL — ABNORMAL HIGH (ref 4.22–5.81)
RDW: 19.8 % — ABNORMAL HIGH (ref 11.5–15.5)
WBC Count: 7.7 10*3/uL (ref 4.0–10.5)
nRBC: 0 % (ref 0.0–0.2)

## 2022-10-23 LAB — CMP (CANCER CENTER ONLY)
ALT: 17 U/L (ref 0–44)
AST: 91 U/L — ABNORMAL HIGH (ref 15–41)
Albumin: 3.2 g/dL — ABNORMAL LOW (ref 3.5–5.0)
Alkaline Phosphatase: 143 U/L — ABNORMAL HIGH (ref 38–126)
Anion gap: 12 (ref 5–15)
BUN: 20 mg/dL (ref 8–23)
CO2: 24 mmol/L (ref 22–32)
Calcium: 9.8 mg/dL (ref 8.9–10.3)
Chloride: 97 mmol/L — ABNORMAL LOW (ref 98–111)
Creatinine: 1.25 mg/dL — ABNORMAL HIGH (ref 0.61–1.24)
GFR, Estimated: >60 mL/min (ref >60)
Glucose, Bld: 137 mg/dL — ABNORMAL HIGH (ref 70–99)
Potassium: 3 mmol/L — ABNORMAL LOW (ref 3.5–5.1)
Sodium: 133 mmol/L — ABNORMAL LOW (ref 135–145)
Total Bilirubin: 1.1 mg/dL (ref 0.3–1.2)
Total Protein: 8.1 g/dL (ref 6.5–8.1)

## 2022-10-23 LAB — TOTAL PROTEIN, URINE DIPSTICK: Protein, ur: 30 mg/dL — AB

## 2022-10-23 LAB — TSH: TSH: 1.68 u[IU]/mL (ref 0.350–4.500)

## 2022-10-23 MED ORDER — SODIUM CHLORIDE 0.9 % IV SOLN
1200.0000 mg | Freq: Once | INTRAVENOUS | Status: AC
  Administered 2022-10-23: 1200 mg via INTRAVENOUS
  Filled 2022-10-23: qty 20

## 2022-10-23 MED ORDER — METOPROLOL SUCCINATE ER 100 MG PO TB24: 100.0000 mg | ORAL_TABLET | Freq: Every day | ORAL | 0 refills | Status: DC

## 2022-10-23 MED ORDER — SODIUM CHLORIDE 0.9 % IV SOLN: Freq: Once | INTRAVENOUS | Status: AC

## 2022-10-23 MED ORDER — DRONABINOL 5 MG PO CAPS: 5.0000 mg | ORAL_CAPSULE | Freq: Two times a day (BID) | ORAL | 0 refills | Status: DC

## 2022-10-23 MED ORDER — SODIUM CHLORIDE 0.9 % IV SOLN
16.0000 mg | Freq: Once | INTRAVENOUS | Status: AC
  Administered 2022-10-23: 16 mg via INTRAVENOUS
  Filled 2022-10-23: qty 8

## 2022-10-23 MED ORDER — SODIUM CHLORIDE 0.9 % IV SOLN
15.0000 mg/kg | Freq: Once | INTRAVENOUS | Status: AC
  Administered 2022-10-23: 1400 mg via INTRAVENOUS
  Filled 2022-10-23: qty 8

## 2022-10-23 NOTE — Progress Notes (Signed)
Pt's AST is 91. Notified Dr. Mosetta Putt and received orders to proceed w/ treatment today. She is also aware of pt's VS. Received new order for pt's nausea.

## 2022-10-23 NOTE — Progress Notes (Signed)
[facility]   [phone] [phone]   Clinic Follow up Note   Patient Care Team: Patient, No Pcp Per as PCP - General (General Practice) Malachy Mood, MD as Consulting Physician (Oncology)  Date of Service: 10/23/2022  CHIEF COMPLAINT: f/u of Hepatocellular carcinoma (HCC)   CURRENT THERAPY:  First line Atezolizumab+ Bevacizumab  q21d  ASSESSMENT:  Mathew [name] is a 72 y.o. male with    Hepatocellular carcinoma (HCC) -cT3N1M0 stage IVA -presented with abdominal bloating, nausea and diarrhea satiety.  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  He is not a candidate for liver transplant due to the large size of his tumor.  I doubt he is a candidate for upfront surgery -I recommend systemic treatment given the high tumor burden and high AFP -I recommend first-line therapy with Atezo and bevacizumab. Plan to start today -His case was discussed in GI tumor board.  Although his liver cancer is technically resectable, due to the extremely high AFP, his risk of recurrence is extremely high.  We recommend systemic therapy, and possible bland embolization, to see how he responds.   -I reviewed his recent liver MRI scan images, and discussed the findings with patient.  Showed stable large tumor in the right lobe, no additional lesions in the liver.   -I will send a message to pancreatobiliary surgeon Dr. Donell Beers and Dr. Freida Busman.  Due to patient's significant symptoms from his large tumor burden, I recommend Y90 in addition to systemic treatment to better control his disease, before surgery can be considered. -He is tolerating treatment  moderately well, does complain taste change and fatigue, and persistent nausea. -He does not have appointment with surgeon, I will check with Dr. Donell Beers and Dr. Cleon Gustin -Will also refer him to interventional radiology for liver directed therapy, such as Y90 open embolization -His tumor marker was about 1,000,001 months before his first treatment, increased further to 1.50millon 3 weeks ago, will continue monitoring   Nausea, low appetite and weight loss -Secondary to his underlying HCC -He has tried Zofran, Compazine, Reglan and scopolamine patch, did not help -He also has low appetite and weight loss -I called in Marinol to his pharmacy today, we discussed the benefit and side effects -If Marinol does not help, will let him try Megace. -He has met palliative care nurse practitioner Lowella Bandy a few weeks ago, will schedule his follow-up with her.   IPF (idiopathic pulmonary fibrosis) (HCC) -f/u with pulmonary      PLAN: - referral to Surgeon - referral to IR for liver directed therapy  -I called in 2 week supply of miranol to help with appetite and nausea - reviewed medication for refills - f/u in 3 weeks - f/u with palliative care NP Lowella Bandy next week     SUMMARY OF ONCOLOGIC HISTORY: Oncology History Overview Note   Cancer Staging  Hepatocellular carcinoma [facility]) Staging form: Liver, AJCC 8th Edition - Clinical stage from 08/30/2022: Stage IVA (cT3, cN1, cM0) - Signed by Malachy Mood, MD on 09/03/2022 Stage prefix: Initial diagnosis     Hepatocellular carcinoma (HCC)  08/30/2022 Cancer Staging   Staging form: Liver, AJCC 8th Edition - Clinical  stage from 08/30/2022: Stage IVA (cT3, cN1, cM0) - Signed by Malachy Mood, MD on 09/03/2022 Stage prefix: Initial diagnosis   09/03/2022 Initial Diagnosis   Hepatocellular carcinoma (HCC)   09/10/2022 Imaging    IMPRESSION: No evidence of metastatic disease or other within the thorax.   Stable chronic interstitial lung disease.   Aortic  Atherosclerosis (ICD10-I70.0).   09/12/2022 -  Chemotherapy   Patient is on Treatment Plan : LUNG Atezolizumab + Bevacizumab Maintenance q21d        INTERVAL HISTORY:  Mathew [name] is here for a follow up of Hepatocellular carcinoma (HCC). He was last seen by  me on 10/02/2022. He presents to the clinic accompanied by wife. Patient states he is losing weight. He is not eating a lot. He can take a few bites of food then he has to go to the bathroom for a bowel movement. The medicine is not working for the nausea.   All other systems were reviewed with the patient and are negative.  MEDICAL HISTORY:  Past Medical History:  Diagnosis Date   Hepatitis C    s/p treatment   Hypertension    Obstructive sleep apnea    unable to tolerate CPAP   Premature ventricular contraction    Prostate cancer (HCC) 2006    SURGICAL HISTORY: Past Surgical History:  Procedure Laterality Date   LOOP RECORDER INSERTION N/A 07/04/2016   Procedure: Loop Recorder Insertion;  Surgeon: Hillis Range, MD;  Location: [facility] INVASIVE CV LAB;  Service: Cardiovascular;  Laterality: N/A;   prostectomy     SPLENECTOMY, TOTAL      I have reviewed the social history and family history with the patient and they are unchanged from previous note.  ALLERGIES:  has No Known Allergies.  MEDICATIONS:  Current Outpatient Medications  Medication Sig Dispense Refill   dronabinol (MARINOL) 5 MG capsule Take 1 capsule (5 mg total) by mouth 2 (two) times daily before a meal. 30 capsule 0   amLODipine (NORVASC) 5 MG tablet Take 1 tablet (5 mg total) by mouth daily. Please make overdue appt with Dr. Johney Frame before anymore refills. 3rd and Final Attempt 15 tablet 0   APIXABAN (ELIQUIS) VTE STARTER PACK (10MG  AND 5MG ) Take as directed on package: start with two-5mg  tablets twice daily for 7 days. On day 8, switch to one-5mg  tablet twice daily. 1 each 0   escitalopram (LEXAPRO) 10 MG tablet Take 10 mg by mouth daily.     losartan (COZAAR)  100 MG tablet Take 1 tablet (100 mg total) by mouth daily. Please make overdue appt with Dr. Johney Frame before anymore refills. 3rd and Final Attempt 15 tablet 0   metoCLOPramide (REGLAN) 10 MG tablet Take 1 tablet (10 mg total) by mouth 4 (four) times daily -  before meals and at bedtime. 60 tablet 1   metoprolol succinate (TOPROL-XL) 100 MG 24 hr tablet Take 1 tablet (100 mg total) by mouth daily. NEED TO SCHEDULE APPOINTMENT 30 tablet 0   Multiple Vitamin (MULTIVITAMIN WITH MINERALS) TABS tablet Take 1 tablet by mouth daily.     ondansetron (ZOFRAN-ODT) 4 MG disintegrating tablet Take 1 tablet (4 mg total) by mouth every 8 (eight) hours as needed for nausea or vomiting. 8 tablet 0   pantoprazole (PROTONIX) 40 MG tablet Take 1 tablet (40 mg total) by mouth daily. 30 tablet 2   pravastatin (PRAVACHOL) 20 MG tablet Take 20 mg by mouth daily.     prochlorperazine (COMPAZINE) 10 MG tablet Take 1  tablet (10 mg total) by mouth every 6 (six) hours as needed for nausea or vomiting. 30 tablet 0   promethazine (PHENERGAN) 25 MG tablet Take 1 tablet (25 mg total) by mouth every 8 (eight) hours as needed for nausea. 8 tablet 0   scopolamine (TRANSDERM-SCOP) 1 MG/3DAYS Place 1 patch (1.5 mg total) onto the skin every 3 (three) days. 10 patch 12   valACYclovir (VALTREX) 500 MG tablet Take 1 tablet by mouth daily as needed. For cold sore flare ups     No current facility-administered medications for this visit.   Facility-Administered Medications Ordered in Other Visits  Medication Dose Route Frequency Provider Last Rate Last Admin   atezolizumab (TECENTRIQ) 1,200 mg in sodium chloride 0.9 % 250 mL chemo infusion  1,200 mg Intravenous Once Malachy Mood, MD 540 mL/hr at 10/23/22 1632 1,200 mg at 10/23/22 1632   bevacizumab-awwb (MVASI) 1,400 mg in sodium chloride 0.9 % 100 mL chemo infusion  15 mg/kg (Treatment Plan Recorded) Intravenous Once Malachy Mood, MD        PHYSICAL EXAMINATION: ECOG PERFORMANCE STATUS: 2 -  Symptomatic, <50% confined to bed  Vitals:   10/23/22 1440  BP: (!) 137/103  Pulse: (!) 108  Resp: 18  Temp: 98.5 F (36.9 C)  SpO2: 96%   Wt Readings from Last 3 Encounters:  10/23/22 185 lb 11.2 oz (84.2 kg)  10/02/22 192 lb 12.8 oz (87.5 kg)  09/26/22 194 lb 1.6 oz (88 kg)     GENERAL:alert, no distress and comfortable SKIN: skin color normal, no rashes or significant lesions EYES: normal, Conjunctiva are pink and non-injected, sclera clear  NEURO: alert & oriented x 3 with fluent speech LUNGS:(-) clear to auscultation and percussion with normal breathing effort HEART: (-) regular rate & rhythm and no murmurs and no lower extremity edema ABDOMEN:(-)abdomen soft, (+)tender  in the right upper quadrant and normal bowel sounds  LABORATORY DATA:  I have reviewed the data as listed    Latest Ref Rng & Units 10/23/2022    2:17 PM 10/02/2022    2:20 PM 09/18/2022   10:19 AM  CBC  WBC 4.0 - 10.5 K/uL 7.7  9.2  12.3   Hemoglobin 13.0 - 17.0 g/dL 16.1  09.6  04.5   Hematocrit 39.0 - 52.0 % 55.2  52.2  52.7   Platelets 150 - 400 K/uL 238  285  165         Latest Ref Rng & Units 10/23/2022    2:17 PM 10/02/2022    2:20 PM 09/18/2022   10:19 AM  CMP  Glucose 70 - 99 mg/dL 409  76  811   BUN 8 - 23 mg/dL 20  16  21    Creatinine 0.61 - 1.24 mg/dL 9.14  7.82  9.56   Sodium 135 - 145 mmol/L 133  136  134   Potassium 3.5 - 5.1 mmol/L 3.0  3.4  3.5   Chloride 98 - 111 mmol/L 97  103  101   CO2 22 - 32 mmol/L 24  25  24    Calcium 8.9 - 10.3 mg/dL 9.8  9.5  9.4   Total Protein 6.5 - 8.1 g/dL 8.1  7.9  8.1   Total Bilirubin 0.3 - 1.2 mg/dL 1.1  0.8  1.5   Alkaline Phos 38 - 126 U/L 143  167  117   AST 15 - 41 U/L 91  64  60   ALT 0 - 44 U/L 17  18  12       RADIOGRAPHIC STUDIES: I have personally reviewed the radiological images as listed and agreed with the findings in the report. No results found.    Orders Placed This Encounter  Procedures   IR Radiologist Eval & Mgmt     Standing Status:   Future    Standing Expiration Date:   10/23/2023    Order Specific Question:   Reason for Exam (SYMPTOM  OR DIAGNOSIS REQUIRED)    Answer:   liver targeted therapy for [facility]    Order Specific Question:   Preferred Imaging Location?    Answer:   [facility]   CBC with Differential (Cancer Center Only)    Standing Status:   Future    Standing Expiration Date:   11/13/2023   CMP (Cancer Center only)    Standing Status:   Future    Standing Expiration Date:   11/13/2023   CBC with Differential (Cancer Center Only)    Standing Status:   Future    Standing Expiration Date:   12/04/2023   CMP (Cancer Center only)    Standing Status:   Future    Standing Expiration Date:   12/04/2023   All questions were answered. The patient knows to call the clinic with any problems, questions or concerns. No barriers to learning was detected. The total time spent in the appointment was 30 minutes.     Malachy Mood, MD 10/23/2022  I Sharlette Dense, CMA, am acting as scribe for Malachy Mood, MD.   I have reviewed the above documentation for accuracy and completeness, and I agree with the above.

## 2022-10-23 NOTE — Telephone Encounter (Signed)
Notified Patient's Spouse of prior authorization approval for Dronabinol 2.5 mg Capsules. Medication is approved through 04/24/2023. No other needs or concerns voiced at this time.

## 2022-10-23 NOTE — Patient Instructions (Signed)
[facility]  Discharge Instructions: Thank you for choosing [facility] to provide your oncology and hematology care.   If you have a lab appointment with the Cancer Center, please go directly to the Cancer Center and check in at the registration area.   Wear comfortable clothing and clothing appropriate for easy access to any Portacath or PICC line.   We strive to give you quality time with your provider. You may need to reschedule your appointment if you arrive late (15 or more minutes).  Arriving late affects you and other patients whose appointments are after yours.  Also, if you miss three or more appointments without notifying the office, you may be dismissed from the clinic at the provider's discretion.      For prescription refill requests, have your pharmacy contact our office and allow 72 hours for refills to be completed.    Today you received the following chemotherapy and/or immunotherapy agents: Bevacizumab, Tecentriq.       To help prevent nausea and vomiting after your treatment, we encourage you to take your nausea medication as directed.  BELOW ARE SYMPTOMS THAT SHOULD BE REPORTED IMMEDIATELY: *FEVER GREATER THAN 100.4 F (38 C) OR HIGHER *CHILLS OR SWEATING *NAUSEA AND VOMITING THAT IS NOT CONTROLLED WITH YOUR NAUSEA MEDICATION *UNUSUAL SHORTNESS OF BREATH *UNUSUAL BRUISING OR BLEEDING *URINARY PROBLEMS (pain or burning when urinating, or frequent urination) *BOWEL PROBLEMS (unusual diarrhea, constipation, pain near the anus) TENDERNESS IN MOUTH AND THROAT WITH OR WITHOUT PRESENCE OF ULCERS (sore throat, sores in mouth, or a toothache) UNUSUAL RASH, SWELLING OR PAIN  UNUSUAL VAGINAL DISCHARGE OR ITCHING   Items with * indicate a potential emergency and should be followed up as soon as possible or go to the Emergency Department if any problems should occur.  Please show the CHEMOTHERAPY ALERT CARD or IMMUNOTHERAPY  ALERT CARD at check-in to the Emergency Department and triage nurse.  Should you have questions after your visit or need to cancel or reschedule your appointment, please contact [facility]  Dept: [phone]  and follow the prompts.  Office hours are 8:00 a.m. to 4:30 p.m. Monday - Friday. Please note that voicemails left after 4:00 p.m. may not be returned until the following business day.  We are closed weekends and major holidays. You have access to a nurse at all times for urgent questions. Please call the main number to the clinic Dept: [phone] and follow the prompts.   For any non-urgent questions, you may also contact your provider using MyChart. We now offer e-Visits for anyone 18 and older to request care online for non-urgent symptoms. For details visit [email or web address].   Also download the MyChart app! Go to the app store, search "MyChart", open the app, select [facility], and log in with your MyChart username and password.   

## 2022-10-25 ENCOUNTER — Inpatient Hospital Stay (HOSPITAL_COMMUNITY)
Admission: EM | Admit: 2022-10-25 | Discharge: 2022-10-29 | DRG: 219 | Disposition: A | Discharge status: Rehabilitation Facility | Payer: BC Managed Care – PPO | Attending: Internal Medicine | Admitting: Internal Medicine

## 2022-10-25 ENCOUNTER — Other Ambulatory Visit: Payer: Self-pay

## 2022-10-25 ENCOUNTER — Emergency Department (HOSPITAL_COMMUNITY): Payer: BC Managed Care – PPO

## 2022-10-25 ENCOUNTER — Encounter (HOSPITAL_COMMUNITY): Payer: Self-pay

## 2022-10-25 DIAGNOSIS — I71012 Dissection of descending thoracic aorta: Secondary | ICD-10-CM | POA: Diagnosis present

## 2022-10-25 DIAGNOSIS — Z9081 Acquired absence of spleen: Secondary | ICD-10-CM

## 2022-10-25 DIAGNOSIS — F4321 Adjustment disorder with depressed mood: Secondary | ICD-10-CM | POA: Diagnosis present

## 2022-10-25 DIAGNOSIS — I81 Portal vein thrombosis: Secondary | ICD-10-CM | POA: Diagnosis present

## 2022-10-25 DIAGNOSIS — F17210 Nicotine dependence, cigarettes, uncomplicated: Secondary | ICD-10-CM | POA: Diagnosis present

## 2022-10-25 DIAGNOSIS — R601 Generalized edema: Secondary | ICD-10-CM | POA: Diagnosis not present

## 2022-10-25 DIAGNOSIS — D72829 Elevated white blood cell count, unspecified: Secondary | ICD-10-CM | POA: Diagnosis present

## 2022-10-25 DIAGNOSIS — B1920 Unspecified viral hepatitis C without hepatic coma: Secondary | ICD-10-CM | POA: Diagnosis present

## 2022-10-25 DIAGNOSIS — E782 Mixed hyperlipidemia: Secondary | ICD-10-CM | POA: Diagnosis not present

## 2022-10-25 DIAGNOSIS — C799 Secondary malignant neoplasm of unspecified site: Secondary | ICD-10-CM | POA: Diagnosis not present

## 2022-10-25 DIAGNOSIS — Z79620 Long term (current) use of immunosuppressive biologic: Secondary | ICD-10-CM | POA: Diagnosis not present

## 2022-10-25 DIAGNOSIS — G9519 Other vascular myelopathies: Secondary | ICD-10-CM | POA: Diagnosis not present

## 2022-10-25 DIAGNOSIS — G4733 Obstructive sleep apnea (adult) (pediatric): Secondary | ICD-10-CM | POA: Diagnosis present

## 2022-10-25 DIAGNOSIS — G9511 Acute infarction of spinal cord (embolic) (nonembolic): Secondary | ICD-10-CM | POA: Diagnosis not present

## 2022-10-25 DIAGNOSIS — J9811 Atelectasis: Secondary | ICD-10-CM | POA: Diagnosis not present

## 2022-10-25 DIAGNOSIS — C220 Liver cell carcinoma: Secondary | ICD-10-CM | POA: Diagnosis present

## 2022-10-25 DIAGNOSIS — N179 Acute kidney failure, unspecified: Secondary | ICD-10-CM | POA: Diagnosis present

## 2022-10-25 DIAGNOSIS — Z79899 Other long term (current) drug therapy: Secondary | ICD-10-CM | POA: Diagnosis not present

## 2022-10-25 DIAGNOSIS — G8314 Monoplegia of lower limb affecting left nondominant side: Secondary | ICD-10-CM | POA: Diagnosis not present

## 2022-10-25 DIAGNOSIS — E785 Hyperlipidemia, unspecified: Secondary | ICD-10-CM | POA: Diagnosis present

## 2022-10-25 DIAGNOSIS — Z8546 Personal history of malignant neoplasm of prostate: Secondary | ICD-10-CM

## 2022-10-25 DIAGNOSIS — I10 Essential (primary) hypertension: Secondary | ICD-10-CM | POA: Diagnosis present

## 2022-10-25 DIAGNOSIS — E872 Acidosis, unspecified: Secondary | ICD-10-CM | POA: Diagnosis not present

## 2022-10-25 DIAGNOSIS — I480 Paroxysmal atrial fibrillation: Secondary | ICD-10-CM | POA: Diagnosis present

## 2022-10-25 DIAGNOSIS — R079 Chest pain, unspecified: Secondary | ICD-10-CM

## 2022-10-25 DIAGNOSIS — M542 Cervicalgia: Secondary | ICD-10-CM | POA: Diagnosis not present

## 2022-10-25 DIAGNOSIS — I959 Hypotension, unspecified: Secondary | ICD-10-CM | POA: Diagnosis not present

## 2022-10-25 DIAGNOSIS — D688 Other specified coagulation defects: Secondary | ICD-10-CM | POA: Diagnosis present

## 2022-10-25 DIAGNOSIS — J849 Interstitial pulmonary disease, unspecified: Secondary | ICD-10-CM | POA: Diagnosis present

## 2022-10-25 DIAGNOSIS — C7802 Secondary malignant neoplasm of left lung: Secondary | ICD-10-CM | POA: Diagnosis present

## 2022-10-25 DIAGNOSIS — C3490 Malignant neoplasm of unspecified part of unspecified bronchus or lung: Secondary | ICD-10-CM | POA: Diagnosis not present

## 2022-10-25 DIAGNOSIS — C228 Malignant neoplasm of liver, primary, unspecified as to type: Secondary | ICD-10-CM | POA: Diagnosis not present

## 2022-10-25 DIAGNOSIS — Z95820 Peripheral vascular angioplasty status with implants and grafts: Secondary | ICD-10-CM | POA: Diagnosis not present

## 2022-10-25 DIAGNOSIS — C7801 Secondary malignant neoplasm of right lung: Secondary | ICD-10-CM | POA: Diagnosis present

## 2022-10-25 DIAGNOSIS — I7121 Aneurysm of the ascending aorta, without rupture: Secondary | ICD-10-CM | POA: Diagnosis not present

## 2022-10-25 DIAGNOSIS — I7102 Dissection of abdominal aorta: Secondary | ICD-10-CM | POA: Diagnosis not present

## 2022-10-25 DIAGNOSIS — Z85528 Personal history of other malignant neoplasm of kidney: Secondary | ICD-10-CM | POA: Diagnosis not present

## 2022-10-25 DIAGNOSIS — E860 Dehydration: Secondary | ICD-10-CM | POA: Diagnosis present

## 2022-10-25 DIAGNOSIS — I719 Aortic aneurysm of unspecified site, without rupture: Secondary | ICD-10-CM | POA: Diagnosis not present

## 2022-10-25 DIAGNOSIS — I639 Cerebral infarction, unspecified: Secondary | ICD-10-CM | POA: Diagnosis not present

## 2022-10-25 DIAGNOSIS — E440 Moderate protein-calorie malnutrition: Secondary | ICD-10-CM | POA: Diagnosis present

## 2022-10-25 DIAGNOSIS — R059 Cough, unspecified: Secondary | ICD-10-CM | POA: Diagnosis not present

## 2022-10-25 DIAGNOSIS — I249 Acute ischemic heart disease, unspecified: Secondary | ICD-10-CM

## 2022-10-25 DIAGNOSIS — R29898 Other symptoms and signs involving the musculoskeletal system: Secondary | ICD-10-CM | POA: Diagnosis not present

## 2022-10-25 DIAGNOSIS — E871 Hypo-osmolality and hyponatremia: Secondary | ICD-10-CM | POA: Diagnosis present

## 2022-10-25 DIAGNOSIS — Z7189 Other specified counseling: Secondary | ICD-10-CM | POA: Diagnosis not present

## 2022-10-25 DIAGNOSIS — I6200 Nontraumatic subdural hemorrhage, unspecified: Secondary | ICD-10-CM | POA: Diagnosis not present

## 2022-10-25 DIAGNOSIS — Z800 Family history of malignant neoplasm of digestive organs: Secondary | ICD-10-CM | POA: Diagnosis not present

## 2022-10-25 DIAGNOSIS — R053 Chronic cough: Secondary | ICD-10-CM | POA: Diagnosis not present

## 2022-10-25 DIAGNOSIS — I71019 Dissection of thoracic aorta, unspecified: Secondary | ICD-10-CM | POA: Diagnosis not present

## 2022-10-25 DIAGNOSIS — I614 Nontraumatic intracerebral hemorrhage in cerebellum: Secondary | ICD-10-CM | POA: Diagnosis not present

## 2022-10-25 DIAGNOSIS — Z8042 Family history of malignant neoplasm of prostate: Secondary | ICD-10-CM

## 2022-10-25 DIAGNOSIS — Z7901 Long term (current) use of anticoagulants: Secondary | ICD-10-CM | POA: Diagnosis not present

## 2022-10-25 DIAGNOSIS — Z9079 Acquired absence of other genital organ(s): Secondary | ICD-10-CM | POA: Diagnosis not present

## 2022-10-25 DIAGNOSIS — I7113 Aneurysm of the descending thoracic aorta, ruptured: Secondary | ICD-10-CM | POA: Diagnosis not present

## 2022-10-25 DIAGNOSIS — I6310 Cerebral infarction due to embolism of unspecified precerebral artery: Secondary | ICD-10-CM | POA: Diagnosis not present

## 2022-10-25 DIAGNOSIS — G9341 Metabolic encephalopathy: Secondary | ICD-10-CM | POA: Diagnosis not present

## 2022-10-25 DIAGNOSIS — R5383 Other fatigue: Secondary | ICD-10-CM | POA: Diagnosis not present

## 2022-10-25 DIAGNOSIS — Z5112 Encounter for antineoplastic immunotherapy: Secondary | ICD-10-CM | POA: Diagnosis present

## 2022-10-25 DIAGNOSIS — Z515 Encounter for palliative care: Secondary | ICD-10-CM | POA: Diagnosis not present

## 2022-10-25 DIAGNOSIS — J84112 Idiopathic pulmonary fibrosis: Secondary | ICD-10-CM | POA: Diagnosis not present

## 2022-10-25 DIAGNOSIS — R5381 Other malaise: Secondary | ICD-10-CM | POA: Diagnosis not present

## 2022-10-25 DIAGNOSIS — I6930 Unspecified sequelae of cerebral infarction: Secondary | ICD-10-CM | POA: Diagnosis not present

## 2022-10-25 DIAGNOSIS — I700 Atherosclerosis of aorta: Secondary | ICD-10-CM | POA: Diagnosis not present

## 2022-10-25 DIAGNOSIS — I69354 Hemiplegia and hemiparesis following cerebral infarction affecting left non-dominant side: Secondary | ICD-10-CM | POA: Diagnosis present

## 2022-10-25 DIAGNOSIS — Z66 Do not resuscitate: Secondary | ICD-10-CM | POA: Diagnosis present

## 2022-10-25 DIAGNOSIS — G936 Cerebral edema: Secondary | ICD-10-CM | POA: Diagnosis not present

## 2022-10-25 DIAGNOSIS — R000 Tachycardia, unspecified: Secondary | ICD-10-CM | POA: Diagnosis not present

## 2022-10-25 DIAGNOSIS — R110 Nausea: Secondary | ICD-10-CM | POA: Diagnosis not present

## 2022-10-25 DIAGNOSIS — R630 Anorexia: Secondary | ICD-10-CM | POA: Diagnosis not present

## 2022-10-25 DIAGNOSIS — Z720 Tobacco use: Secondary | ICD-10-CM | POA: Diagnosis not present

## 2022-10-25 DIAGNOSIS — E8809 Other disorders of plasma-protein metabolism, not elsewhere classified: Secondary | ICD-10-CM | POA: Diagnosis not present

## 2022-10-25 DIAGNOSIS — G4700 Insomnia, unspecified: Secondary | ICD-10-CM | POA: Diagnosis present

## 2022-10-25 LAB — COMPREHENSIVE METABOLIC PANEL
ALT: 23 U/L (ref 0–44)
AST: 95 U/L — ABNORMAL HIGH (ref 15–41)
Albumin: 2.7 g/dL — ABNORMAL LOW (ref 3.5–5.0)
Alkaline Phosphatase: 129 U/L — ABNORMAL HIGH (ref 38–126)
Anion gap: 10 (ref 5–15)
BUN: 16 mg/dL (ref 8–23)
CO2: 23 mmol/L (ref 22–32)
Calcium: 9.1 mg/dL (ref 8.9–10.3)
Chloride: 100 mmol/L (ref 98–111)
Creatinine, Ser: 1.27 mg/dL — ABNORMAL HIGH (ref 0.61–1.24)
GFR, Estimated: >60 mL/min (ref >60)
Glucose, Bld: 97 mg/dL (ref 70–99)
Potassium: 3.7 mmol/L (ref 3.5–5.1)
Sodium: 133 mmol/L — ABNORMAL LOW (ref 135–145)
Total Bilirubin: 1 mg/dL (ref 0.3–1.2)
Total Protein: 8 g/dL (ref 6.5–8.1)

## 2022-10-25 LAB — CBC
HCT: 54.1 % — ABNORMAL HIGH (ref 39.0–52.0)
Hemoglobin: 17.7 g/dL — ABNORMAL HIGH (ref 13.0–17.0)
MCH: 27.3 pg (ref 26.0–34.0)
MCHC: 32.7 g/dL (ref 30.0–36.0)
MCV: 83.5 fL (ref 80.0–100.0)
Platelets: 206 10*3/uL (ref 150–400)
RBC: 6.48 MIL/uL — ABNORMAL HIGH (ref 4.22–5.81)
RDW: 20.7 % — ABNORMAL HIGH (ref 11.5–15.5)
WBC: 6.8 10*3/uL (ref 4.0–10.5)
nRBC: 0 % (ref 0.0–0.2)

## 2022-10-25 LAB — HEMOGLOBIN A1C
Hgb A1c MFr Bld: 5.7 % — ABNORMAL HIGH (ref 4.8–5.6)
Mean Plasma Glucose: 116.89 mg/dL

## 2022-10-25 LAB — LIPID PANEL
Cholesterol: 316 mg/dL — ABNORMAL HIGH (ref 0–200)
HDL: 38 mg/dL — ABNORMAL LOW (ref >40)
LDL Cholesterol: 263 mg/dL — ABNORMAL HIGH (ref 0–99)
Total CHOL/HDL Ratio: 8.3 RATIO
Triglycerides: 77 mg/dL (ref <150)
VLDL: 15 mg/dL (ref 0–40)

## 2022-10-25 LAB — T4: T4, Total: 10 ug/dL (ref 4.5–12.0)

## 2022-10-25 LAB — TROPONIN I (HIGH SENSITIVITY)
Troponin I (High Sensitivity): 15 ng/L (ref <18)
Troponin I (High Sensitivity): 14 ng/L (ref <18)

## 2022-10-25 LAB — ECHOCARDIOGRAM COMPLETE
Area-P 1/2: 4.12 cm2
Est EF: 55
Height: 75 in
S' Lateral: 2.7 cm
Weight: 2960 oz

## 2022-10-25 LAB — MRSA NEXT GEN BY PCR, NASAL: MRSA by PCR Next Gen: NOT DETECTED

## 2022-10-25 LAB — PROTIME-INR
INR: 1.3 — ABNORMAL HIGH (ref 0.8–1.2)
Prothrombin Time: 16 seconds — ABNORMAL HIGH (ref 11.4–15.2)

## 2022-10-25 LAB — APTT: aPTT: 40 seconds — ABNORMAL HIGH (ref 24–36)

## 2022-10-25 LAB — LIPASE, BLOOD: Lipase: 25 U/L (ref 11–51)

## 2022-10-25 MED ORDER — CHLORHEXIDINE GLUCONATE CLOTH 2 % EX PADS
6.0000 | MEDICATED_PAD | Freq: Every day | CUTANEOUS | Status: DC
  Administered 2022-10-25 – 2022-10-29 (×3): 6 via TOPICAL

## 2022-10-25 MED ORDER — PERFLUTREN LIPID MICROSPHERE
1.0000 mL | INTRAVENOUS | Status: AC | PRN
  Administered 2022-10-25: 5 mL via INTRAVENOUS

## 2022-10-25 MED ORDER — ESMOLOL HCL-SODIUM CHLORIDE 2000 MG/100ML IV SOLN: 25.0000 ug/kg/min | INTRAVENOUS | Status: DC

## 2022-10-25 MED ORDER — ESMOLOL HCL-SODIUM CHLORIDE 2000 MG/100ML IV SOLN
0.0000 ug/kg/min | INTRAVENOUS | Status: DC
  Administered 2022-10-25: 25 ug/kg/min via INTRAVENOUS
  Administered 2022-10-26 (×3): 100 ug/kg/min via INTRAVENOUS
  Administered 2022-10-26: 130 ug/kg/min via INTRAVENOUS
  Administered 2022-10-26: 125 ug/kg/min via INTRAVENOUS
  Administered 2022-10-26: 100 ug/kg/min via INTRAVENOUS
  Administered 2022-10-27 (×2): 125 ug/kg/min via INTRAVENOUS
  Administered 2022-10-27: 150 ug/kg/min via INTRAVENOUS
  Administered 2022-10-27: 125 ug/kg/min via INTRAVENOUS
  Administered 2022-10-27: 150 ug/kg/min via INTRAVENOUS
  Administered 2022-10-27: 100 ug/kg/min via INTRAVENOUS
  Administered 2022-10-28: 80 ug/kg/min via INTRAVENOUS
  Filled 2022-10-25 (×11): qty 100
  Filled 2022-10-25: qty 200
  Filled 2022-10-25: qty 100

## 2022-10-25 MED ORDER — POLYETHYLENE GLYCOL 3350 17 G PO PACK: 17.0000 g | PACK | Freq: Every day | ORAL | Status: DC | PRN

## 2022-10-25 MED ORDER — NITROGLYCERIN IN D5W 200-5 MCG/ML-% IV SOLN: 0.0000 ug/min | INTRAVENOUS | Status: DC

## 2022-10-25 MED ORDER — HEPARIN SODIUM (PORCINE) 5000 UNIT/ML IJ SOLN
4000.0000 [IU] | Freq: Once | INTRAMUSCULAR | Status: AC
  Administered 2022-10-25: 4000 [IU] via INTRAVENOUS
  Filled 2022-10-25: qty 1

## 2022-10-25 MED ORDER — MORPHINE SULFATE (PF) 4 MG/ML IV SOLN
4.0000 mg | Freq: Once | INTRAVENOUS | Status: AC
  Administered 2022-10-25: 4 mg via INTRAVENOUS
  Filled 2022-10-25: qty 1

## 2022-10-25 MED ORDER — MORPHINE SULFATE (PF) 2 MG/ML IV SOLN
2.0000 mg | INTRAVENOUS | Status: DC | PRN
  Administered 2022-10-25 – 2022-10-29 (×3): 2 mg via INTRAVENOUS
  Filled 2022-10-25 (×3): qty 1

## 2022-10-25 MED ORDER — CLEVIDIPINE BUTYRATE 0.5 MG/ML IV EMUL
0.0000 mg/h | INTRAVENOUS | Status: DC
  Administered 2022-10-25: 21 mg/h via INTRAVENOUS
  Administered 2022-10-25: 2 mg/h via INTRAVENOUS
  Administered 2022-10-26 (×2): 21 mg/h via INTRAVENOUS
  Administered 2022-10-26: 17 mg/h via INTRAVENOUS
  Administered 2022-10-26: 21 mg/h via INTRAVENOUS
  Administered 2022-10-26: 19 mg/h via INTRAVENOUS
  Administered 2022-10-26 (×2): 21 mg/h via INTRAVENOUS
  Administered 2022-10-26: 19 mg/h via INTRAVENOUS
  Administered 2022-10-26: 17 mg/h via INTRAVENOUS
  Administered 2022-10-27: 13 mg/h via INTRAVENOUS
  Administered 2022-10-27: 15 mg/h via INTRAVENOUS
  Administered 2022-10-27: 9 mg/h via INTRAVENOUS
  Administered 2022-10-27 (×2): 13 mg/h via INTRAVENOUS
  Filled 2022-10-25 (×16): qty 100

## 2022-10-25 MED ORDER — IOHEXOL 350 MG/ML SOLN
75.0000 mL | Freq: Once | INTRAVENOUS | Status: AC | PRN
  Administered 2022-10-25: 75 mL via INTRAVENOUS

## 2022-10-25 MED ORDER — MORPHINE SULFATE (PF) 2 MG/ML IV SOLN
2.0000 mg | Freq: Once | INTRAVENOUS | Status: AC
  Administered 2022-10-25: 2 mg via INTRAVENOUS
  Filled 2022-10-25: qty 1

## 2022-10-25 MED ORDER — ORAL CARE MOUTH RINSE: 15.0000 mL | OROMUCOSAL | Status: DC | PRN

## 2022-10-25 MED ORDER — BOOST / RESOURCE BREEZE PO LIQD CUSTOM
1.0000 | Freq: Three times a day (TID) | ORAL | Status: DC
  Administered 2022-10-26 – 2022-10-29 (×9): 1 via ORAL

## 2022-10-25 MED ORDER — NITROGLYCERIN IN D5W 200-5 MCG/ML-% IV SOLN
0.0000 ug/min | INTRAVENOUS | Status: DC
  Administered 2022-10-25: 5 ug/min via INTRAVENOUS
  Filled 2022-10-25: qty 250

## 2022-10-25 MED ORDER — SODIUM CHLORIDE 0.9 % IV SOLN: INTRAVENOUS | Status: DC

## 2022-10-25 MED ORDER — LACTATED RINGERS IV SOLN: INTRAVENOUS | Status: DC

## 2022-10-25 MED ORDER — DOCUSATE SODIUM 100 MG PO CAPS: 100.0000 mg | ORAL_CAPSULE | Freq: Two times a day (BID) | ORAL | Status: DC | PRN

## 2022-10-25 NOTE — ED Provider Notes (Signed)
[place] EMERGENCY DEPARTMENT AT [facility] Provider Note   CSN: [id number] Arrival date & time: 10/25/22  6387     History  Chief Complaint  Patient presents with   Chest Pain    Mathew [name] is a 72 y.o. male.  HPI 72 year old male history of A-fib, recently diagnosed liver cancer, presents today complaining of chest pain.  He states 45 minutes prior to evaluation he began having pain in his right shoulder then went to his substernal area.  He describes it as 8 out of 10 and pressure-like in nature.  He has not had similar symptoms in the past.  He got somewhat sweaty when it first started.  He was given nitroglycerin and aspirin prehospital.  He states that the nitroglycerin relieved the pain for short period of time but it is back at 8 out of 10.  He does not think he has not had a previous catheterization.  He reports that he has had 3 treatments for his liver cancer but is unable to tell me much more about the cancer.  He denies fever, chills, cough, recent injury to his chest area or abdomen.     Home Medications Prior to Admission medications   Medication Sig Start Date End Date Taking? Authorizing Provider  amLODipine (NORVASC) 5 MG tablet Take 1 tablet (5 mg total) by mouth daily. Please make overdue appt with Dr. Johney Frame before anymore refills. 3rd and Final Attempt 01/15/19   Allred, Fayrene Fearing, MD  APIXABAN Everlene Balls) VTE STARTER PACK (10MG  AND 5MG ) Take as directed on package: start with two-5mg  tablets twice daily for 7 days. On day 8, switch to one-5mg  tablet twice daily. 08/20/22   Terald Sleeper, MD  dronabinol (MARINOL) 5 MG capsule Take 1 capsule (5 mg total) by mouth 2 (two) times daily before a meal. 10/23/22   Malachy Mood, MD  escitalopram (LEXAPRO) 10 MG tablet Take 10 mg by mouth daily. 05/27/21   [provider]  losartan (COZAAR) 100 MG tablet Take 1 tablet (100 mg total) by mouth daily. Please make overdue appt with Dr. Johney Frame before anymore  refills. 3rd and Final Attempt 01/15/19   Hillis Range, MD  metoCLOPramide (REGLAN) 10 MG tablet Take 1 tablet (10 mg total) by mouth 4 (four) times daily -  before meals and at bedtime. 09/26/22   Pickenpack-Cousar, Arty Baumgartner, NP  metoprolol succinate (TOPROL-XL) 100 MG 24 hr tablet Take 1 tablet (100 mg total) by mouth daily. NEED TO SCHEDULE APPOINTMENT 10/23/22   Malachy Mood, MD  Multiple Vitamin (MULTIVITAMIN WITH MINERALS) TABS tablet Take 1 tablet by mouth daily.    [provider]  ondansetron (ZOFRAN-ODT) 4 MG disintegrating tablet Take 1 tablet (4 mg total) by mouth every 8 (eight) hours as needed for nausea or vomiting. 09/02/22   Benjiman Core, MD  pantoprazole (PROTONIX) 40 MG tablet Take 1 tablet (40 mg total) by mouth daily. 09/04/22   Malachy Mood, MD  pravastatin (PRAVACHOL) 20 MG tablet Take 20 mg by mouth daily. 05/02/21   [provider]  prochlorperazine (COMPAZINE) 10 MG tablet Take 1 tablet (10 mg total) by mouth every 6 (six) hours as needed for nausea or vomiting. 09/04/22   Malachy Mood, MD  promethazine (PHENERGAN) 25 MG tablet Take 1 tablet (25 mg total) by mouth every 8 (eight) hours as needed for nausea. 09/02/22   Benjiman Core, MD  scopolamine (TRANSDERM-SCOP) 1 MG/3DAYS Place 1 patch (1.5 mg total) onto the skin every 3 (three)  days. 09/26/22   Pickenpack-Cousar, Arty Baumgartner, NP  valACYclovir (VALTREX) 500 MG tablet Take 1 tablet by mouth daily as needed. For cold sore flare ups 03/09/16   [provider]      Allergies    Patient has no known allergies.    Review of Systems   Review of Systems  Physical Exam Updated Vital Signs BP (!) 131/93 (BP Location: Right Arm)   Pulse 66   Temp (!) 97.5 F (36.4 C) (Oral)   Resp 20   Ht 1.905 m (6\' 3" )   Wt 83.9 kg   SpO2 96%   BMI 23.12 kg/m  Physical Exam Vitals and nursing note reviewed.  Constitutional:      General: He is not in acute distress.    Appearance: He is well-developed.  HENT:      Head: Normocephalic.  Eyes:     Pupils: Pupils are equal, round, and reactive to light.  Cardiovascular:     Rate and Rhythm: Normal rate and regular rhythm.     Heart sounds: Normal heart sounds.  Pulmonary:     Effort: Pulmonary effort is normal.     Breath sounds: Normal breath sounds.  Abdominal:     General: Bowel sounds are normal.     Palpations: Abdomen is soft.     Tenderness: There is abdominal tenderness.     Comments: On tenderness to right upper quadrant  Musculoskeletal:     Cervical back: Normal range of motion.     Right lower leg: No tenderness. No edema.     Left lower leg: No tenderness. No edema.  Skin:    General: Skin is warm and dry.     Capillary Refill: Capillary refill takes less than 2 seconds.  Neurological:     General: No focal deficit present.     Mental Status: He is alert.     ED Results / Procedures / Treatments   Labs (all labs ordered are listed, but only abnormal results are displayed) Labs Reviewed  CBC - Abnormal; Notable for the following components:      Result Value   RBC 6.48 (*)    Hemoglobin 17.7 (*)    HCT 54.1 (*)    RDW 20.7 (*)    All other components within normal limits  HEMOGLOBIN A1C - Abnormal; Notable for the following components:   Hgb A1c MFr Bld 5.7 (*)    All other components within normal limits  COMPREHENSIVE METABOLIC PANEL - Abnormal; Notable for the following components:   Sodium 133 (*)    Creatinine, Ser 1.27 (*)    Albumin 2.7 (*)    AST 95 (*)    Alkaline Phosphatase 129 (*)    All other components within normal limits  LIPID PANEL - Abnormal; Notable for the following components:   Cholesterol 316 (*)    HDL 38 (*)    LDL Cholesterol 263 (*)    All other components within normal limits  PROTIME-INR - Abnormal; Notable for the following components:   Prothrombin Time 16.0 (*)    INR 1.3 (*)    All other components within normal limits  APTT - Abnormal; Notable for the following components:    aPTT 40 (*)    All other components within normal limits  LIPASE, BLOOD  CBG MONITORING, ED  I-STAT CG4 LACTIC ACID, ED  TROPONIN I (HIGH SENSITIVITY)  TROPONIN I (HIGH SENSITIVITY)    EKG EKG Interpretation  Date/Time:  Friday October 25 2022 09:39:08 EDT Ventricular Rate:  53 PR Interval:  188 QRS Duration: 82 QT Interval:  458 QTC Calculation: 430 R Axis:   56 Text Interpretation: Sinus rhythm Probable left atrial enlargement Anteroseptal infarct, age indeterminate ST elevation, consider inferior injury Confirmed by Margarita Grizzle [phone]) on 10/25/2022 9:43:26 AM  Radiology CT Angio Chest/Abd/Pel for Dissection W and/or Wo Contrast  Result Date: 10/25/2022 CLINICAL DATA:  Worsening severe substernal chest pain radiating from right shoulder to back, history of hepatocellular carcinoma EXAM: CT ANGIOGRAPHY CHEST, ABDOMEN AND PELVIS TECHNIQUE: Non-contrast CT of the chest was initially obtained. Multidetector CT imaging through the chest, abdomen and pelvis was performed using the standard protocol during bolus administration of intravenous contrast. Multiplanar reconstructed images and MIPs were obtained and reviewed to evaluate the vascular anatomy. RADIATION DOSE REDUCTION: This exam was performed according to the departmental dose-optimization program which includes automated exposure control, adjustment of the mA and/or kV according to patient size and/or use of iterative reconstruction technique. CONTRAST:  75mL OMNIPAQUE IOHEXOL 350 MG/ML SOLN COMPARISON:  09/10/2022, 09/20/2022, 08/19/2022 FINDINGS: CTA CHEST FINDINGS Cardiovascular: There is either intramural hematoma or thrombosed dissection of the descending thoracic aorta, extending from the distal arch just beyond the left subclavian artery origin to the level of the diaphragmatic hiatus. Maximal thickness of the intramural hematoma or thrombosed dissection measures up to 1.3 cm at the level of the proximal descending thoracic  aorta reference image 43/6. This has developed since the prior CT scans performed 08/19/2022 and 09/10/2022. There is no periaortic fat stranding to suggest leak or rupture. No significant stenosis within the true lumen of the aorta. Minimal calcified atheromatous plaque within the distal aortic arch and descending thoracic aorta. No evidence of thoracic aortic aneurysm. The heart is unremarkable without pericardial effusion. Timing of the contrast bolus is suboptimal for evaluation of the pulmonary vasculature. Mediastinum/Nodes: No enlarged mediastinal, hilar, or axillary lymph nodes. Thyroid gland, trachea, and esophagus demonstrate no significant findings. Lungs/Pleura: No acute airspace disease, effusion, or pneumothorax. Minimal bibasilar hypoventilatory changes. Mild emphysema. There are multiple small pulmonary nodules that have developed in the interim, consistent with metastatic disease. Index nodules are as follows: Left lower lobe, image 74/11, 7 mm. Left upper lobe, image 30/11, 4 mm. Right upper lobe, image 61/11, 6 mm. Central airways are patent. Musculoskeletal: No acute or destructive bony abnormalities. Reconstructed images demonstrate no additional findings. Review of the MIP images confirms the above findings. CTA ABDOMEN AND PELVIS FINDINGS VASCULAR Aorta: Normal caliber aorta without aneurysm, dissection, vasculitis or significant stenosis. Celiac: Patent without evidence of aneurysm, dissection, vasculitis or significant stenosis. SMA: Patent without evidence of aneurysm, dissection, vasculitis or significant stenosis. Renals: Both renal arteries are patent without evidence of aneurysm, dissection, vasculitis, fibromuscular dysplasia or significant stenosis. IMA: Patent without evidence of aneurysm, dissection, vasculitis or significant stenosis. Inflow: Patent without evidence of aneurysm, dissection, vasculitis or significant stenosis. Veins: No obvious venous abnormality within the  limitations of this arterial phase study. Review of the MIP images confirms the above findings. NON-VASCULAR Hepatobiliary: Large necrotic mass within the right lobe liver measuring up to 14.8 x 13.5 cm compatible with the patient's known hepatocellular carcinoma. Exact comparison to prior imaging is difficult due to differences in phase of contrast enhancement, though subjectively the mass appears larger than the previous abdominal CT. Gallbladder is unremarkable. No biliary duct dilation. Pancreas: Unremarkable. No pancreatic ductal dilatation or surrounding inflammatory changes. Spleen: Small residual splenules in the left upper quadrant are unchanged. Adrenals/Urinary Tract:  The adrenals are stable. 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 No urinary tract calculi or obstructive uropathy within either kidney. Bladder is unremarkable. Stomach/Bowel: No bowel obstruction or ileus. No bowel wall thickening or inflammatory change. Lymphatic: No pathologic adenopathy within the abdomen or pelvis. Reproductive: Prostate is unremarkable. Other: No free fluid or free intraperitoneal gas. Small fat containing umbilical hernia. No bowel herniation. Musculoskeletal: No acute or destructive bony abnormalities. Reconstructed images demonstrate no additional findings. Review of the MIP images confirms the above findings. IMPRESSION: Vascular: 1. 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 Maximal thickness of the thrombosed false lumen of the dissection measures 1.3 cm. No evidence of true luminal narrowing or aneurysm. 2. Unremarkable abdominal aorta. 3.  Aortic Atherosclerosis (ICD10-I70.0). Nonvascular: 1. Enlarging necrotic right lobe  liver mass consistent with progressive hepatocellular carcinoma. 2. Interval development of numerous bilateral pulmonary nodules consistent with pulmonary metastatic disease. 3. 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 4.  Emphysema (ICD10-J43.9). Critical Value/emergent results were called by telephone at the time of interpretation on 10/25/2022 at 4:47 pm to provider [facility] Glyndon Tursi , who verbally acknowledged these results. Electronically Signed   By: Sharlet Salina M.D.   On: 10/25/2022 16:58   DG Chest Portable 1 View  Result Date: 10/25/2022 CLINICAL DATA:  Chest pain EXAM: PORTABLE CHEST 1 VIEW COMPARISON:  Chest radiograph 09/02/2022 FINDINGS: The cardiomediastinal silhouette is normal. A loop recorder is again noted. There is asymmetric elevation of the right hemidiaphragm. There is no acute osseous abnormality. Coarsened interstitial markings primarily in the lower lobes correspond to changes of chronic interstitial lung disease as seen on recent CT chest. There is no superimposed acute consolidation. There is no pulmonary edema. There is no pleural effusion or pneumothorax. IMPRESSION: No radiographic evidence of acute cardiopulmonary process. Electronically Signed   By: Lesia Hausen M.D.   On: 10/25/2022 11:48   ECHOCARDIOGRAM COMPLETE  Result Date: 10/25/2022    ECHOCARDIOGRAM REPORT   Patient Name:   JAHAD OLD Date of Exam: 10/25/2022 Medical Rec #:  [id number]    Height:       75.0 in Accession #:    [id number]   Weight:       185.0 lb Date of Birth:  01-15-1951   BSA:          2.123 m Patient Age:    71 years     BP:           148/99 mmHg Patient Gender: M            HR:           54 bpm. Exam Location:  Inpatient Procedure: 2D Echo, Color Doppler and Cardiac Doppler STAT ECHO Indications:    R07.9* Chest pain, unspecified  History:        Patient has prior history of Echocardiogram examinations, most                 recent 03/26/2018. Risk Factors:Hypertension and Sleep  Apnea.  Sonographer:    Irving Burton Senior RDCS Referring Phys: [phone] MICHAEL COOPER IMPRESSIONS  1. Left ventricular ejection fraction, by estimation, is 55%. The left ventricle has normal function. The left ventricle  has no regional wall motion abnormalities. There is moderate asymmetric left ventricular hypertrophy of the septal segment. Left ventricular diastolic parameters are consistent with Grade I diastolic dysfunction (impaired relaxation).  2. Right ventricular systolic function is normal. The right ventricular size is normal. There is normal pulmonary artery systolic pressure.  3. The mitral valve is normal in structure. Trivial mitral valve regurgitation. No evidence of mitral stenosis.  4. The aortic valve is tricuspid. Aortic valve regurgitation is not visualized. No aortic stenosis is present.  5. The inferior vena cava is dilated in size with <50% respiratory variability, suggesting right atrial pressure of 15 mmHg. FINDINGS  Left Ventricle: Left ventricular ejection fraction, by estimation, is 55%. The left ventricle has normal function. The left ventricle has no regional wall motion abnormalities. The left ventricular internal cavity size was normal in size. There is moderate asymmetric left ventricular hypertrophy of the septal segment. Left ventricular diastolic parameters are consistent with Grade I diastolic dysfunction (impaired relaxation). Right Ventricle: The right ventricular size is normal. No increase in right ventricular wall thickness. Right ventricular systolic function is normal. There is normal pulmonary artery systolic pressure. The tricuspid regurgitant velocity is 1.63 m/s, and  with an assumed right atrial pressure of 15 mmHg, the estimated right ventricular systolic pressure is 25.6 mmHg. Left Atrium: Left atrial size was normal in size. Right Atrium: Right atrial size was normal in size. Pericardium: Trivial pericardial effusion is present. Mitral Valve: The mitral valve is normal in  structure. Trivial mitral valve regurgitation. No evidence of mitral valve stenosis. Tricuspid Valve: The tricuspid valve is normal in structure. Tricuspid valve regurgitation is mild . No evidence of tricuspid stenosis. Aortic Valve: The aortic valve is tricuspid. Aortic valve regurgitation is not visualized. No aortic stenosis is present. Pulmonic Valve: The pulmonic valve was normal in structure. Pulmonic valve regurgitation is mild. No evidence of pulmonic stenosis. Aorta: The aortic root and ascending aorta are structurally normal, with no evidence of dilitation. Ascending aorta measurements are within normal limits for age when indexed to body surface area. Venous: The inferior vena cava is dilated in size with less than 50% respiratory variability, suggesting right atrial pressure of 15 mmHg. IAS/Shunts: No atrial level shunt detected by color flow Doppler.  LEFT VENTRICLE PLAX 2D LVIDd:         3.80 cm   Diastology LVIDs:         2.70 cm   LV e' medial:    6.74 cm/s LV PW:         1.10 cm   LV E/e' medial:  8.1 LV IVS:        1.40 cm   LV e' lateral:   6.53 cm/s LVOT diam:     2.30 cm   LV E/e' lateral: 8.4 LV SV:         90 LV SV Index:   42 LVOT Area:     4.15 cm  RIGHT VENTRICLE RV S prime:     11.50 cm/s TAPSE (M-mode): 1.7 cm LEFT ATRIUM             Index        RIGHT ATRIUM           Index LA diam:        2.80 cm 1.32 cm/m   RA Area:     15.30 cm LA Vol (A2C):   48.8 ml 22.99 ml/m  RA Volume:   39.10 ml  18.42 ml/m LA Vol (  A4C):   35.9 ml 16.91 ml/m LA Biplane Vol: 42.8 ml 20.16 ml/m  AORTIC VALVE LVOT Vmax:   97.40 cm/s LVOT Vmean:  75.300 cm/s LVOT VTI:    0.217 m  AORTA Ao Root diam: 3.70 cm Ao Asc diam:  3.80 cm MITRAL VALVE               TRICUSPID VALVE MV Area (PHT): 4.12 cm    TR Peak grad:   10.6 mmHg MV Decel Time: 184 msec    TR Vmax:        163.00 cm/s MV E velocity: 54.80 cm/s MV A velocity: 62.20 cm/s  SHUNTS MV E/A ratio:  0.88        Systemic VTI:  0.22 m                             Systemic Diam: 2.30 cm Weston Brass MD Electronically signed by Weston Brass MD Signature Date/Time: 10/25/2022/10:57:13 AM    Final     Procedures .Critical Care  Performed by: Margarita Grizzle, MD Authorized by: Margarita Grizzle, MD   Critical care provider statement:    Critical care time (minutes):  75   Critical care was time spent personally by me on the following activities:  Development of treatment plan with patient or surrogate, discussions with consultants, evaluation of patient's response to treatment, examination of patient, ordering and review of laboratory studies, ordering and review of radiographic studies, ordering and performing treatments and interventions, pulse oximetry, re-evaluation of patient's condition and review of old charts     Medications Ordered in ED Medications  nitroGLYCERIN 50 mg in dextrose 5 % 250 mL (0.2 mg/mL) infusion (25 mcg/min Intravenous Rate/Dose Change 10/25/22 1330)  0.9 %  sodium chloride infusion ( Intravenous New Bag/Given 10/25/22 1118)  perflutren lipid microspheres (DEFINITY) IV suspension (5 mLs Intravenous Given 10/25/22 1044)  morphine (PF) 4 MG/ML injection 4 mg (has no administration in time range)  heparin injection 4,000 Units (4,000 Units Intravenous Given 10/25/22 1004)  morphine (PF) 2 MG/ML injection 2 mg (2 mg Intravenous Given 10/25/22 1334)  iohexol (OMNIPAQUE) 350 MG/ML injection 75 mL (75 mLs Intravenous Contrast Given 10/25/22 1633)    ED Course/ Medical Decision Making/ A&P Clinical Course as of 10/25/22 1702  Fri Oct 25, 2022  1250 Complete metabolic panel is reviewed and interpreted significant for mild hyponatremia with sodium 133, creatinine elevated at 1.27 and alk phos elevated at 129.  These are stable from 2 days ago at cancer center [DR]  1250 First troponin is reviewed and interpreted and is 15 awaiting repeat [DR]  1450 Troponin and repeat troponin are stable Lipase is pending CT is pending [DR]    Clinical  Course User Index [DR] Margarita Grizzle, MD                             Medical Decision Making Amount and/or Complexity of Data Reviewed Labs: ordered. Radiology: ordered.  Risk Prescription drug management.   72 year old male presents today with chest pain.  Patient with recent diagnosis of liver cancer EKG obtained and T wave inversion noted in V1 and V2 with mild ST elevation in 2 3 aVF Care discussed with Dr. Excell Seltzer.  Dr. Excell Seltzer is at bedside and has evaluated patient.  He has reviewed EKG and feels that it is suspicious for coronary artery disease but is not clearly ST  elevation MI.  Given that the patient has known liver mets and has been having from pain from this it also could represent other multiple other conditions.  Current plan is to give heparin in a single dose. Patient is receiving a dose of nitro Dr. Excell Seltzer is ordering bedside echo Continue to monitor pain and other symptoms. Awaiting return of troponin Troponin and repeat troponin 15 and 14 respectively.  Doubt cardiac etiology Patient has been on nitro drip and pain has continues currently still 7 out of 10.  I am discontinuing drip and will give more morphine. Patient has had recently diagnosed hepatocellular carcinoma.  He is not candidate for liver transplant and is unlikely to be candidate for merry surgery.  He is being treated with at [facility] and bevacizumab.  1 chest pain  does not appear to be cardiac in etiology.  Suspect that with the patient's recent diagnosis of hepatocellular carcinoma this may be related.  Lipase and CT scan are pending.  Patient has required pain management that includes IV narcotic pain medicine.  Patient on nitro drip with blood pressure well-controlled with systolic blood pressure 130 CT dissection study with verbal report of dissection from left subclavian to diaphragm with thrombosis.  Mural opening 2.8 cm Current blood pressure is 130/93 2 patient with hepatocellular carcinoma.  He  started chemotherapy yesterday.  Radiologist notes mets to lungs on study.  AST is elevated at 95 ALT normal alk phos elevated at 129 Patient is being treated for hepatocellular carcinoma.  He is not a transplant candidate.  He is undergoing chemotherapeutic treatments.  He has had ongoing fatigue, nausea, and some pain but pain today appears to be much more severe and exacerbated and not similar to his prior pain. He has been seen by palliative care for symptom management. Received call from radiologist patient has dissection from left subclavian to diaphragm.  Appears to be thrombosed.  He has flow through the aorta. Discussed with Dr. Milinda Antis who advises medical management with blood pressure control.  Patient's blood pressure is currently 130/90 Page placed to oncology-Discussed with Dr. Corky Sing on-call for oncology, she will see tomorrow Paged to medicine for admission= Dr. Isidoro Donning who requested a consult critical care. Discussed with critical care.  They will start Cleviprex.  They will see for admission       Final Clinical Impression(s) / ED Diagnoses Final diagnoses:  Dissection of descending thoracic aorta [facility])  Metastatic malignant neoplasm, unspecified site [facility])    Rx / DC Orders ED Discharge Orders     None         Margarita Grizzle, MD 10/25/22 1717

## 2022-10-25 NOTE — ED Triage Notes (Signed)
Patient arrives by EMS from home with c/o chest pressure that started in right shoulder and radiates into chest.   Pain started about 40 mins PTA while patient was sitting watching TV.   Patient received 324 mg ASA and 1 SL nitroglycerin.  On arrival patient reports pressure 7/10.

## 2022-10-25 NOTE — ED Notes (Signed)
ECHO tech at bedside.

## 2022-10-25 NOTE — Progress Notes (Signed)
eLink Physician-Brief Progress Note Patient Name: Mathew [name] DOB: 1950-07-06 MRN: [id number]   Date of Service  10/25/2022  HPI/Events of Note  Patient admitted with aortic dissection secondary to uncontrolled hypertension. He has a history of Hepatocellular carcinoma for which he is receiving chemotherapy.  eICU Interventions  New Patient Evaluation.        Migdalia Dk 10/25/2022, 8:56 PM

## 2022-10-25 NOTE — Progress Notes (Signed)
Echocardiogram 2D Echocardiogram has been performed.  Dquan Lacy Oak Dorey RDCS 10/25/2022, 10:44 AM

## 2022-10-25 NOTE — H&P (Signed)
NAME:  Mathew [name], MRN:  [id number], DOB:  [birth year], LOS: 0 ADMISSION DATE:  10/25/2022, CONSULTATION DATE:  10/25/2022 REFERRING MD:  Dr. Rosalia Hammers - EDP, CHIEF COMPLAINT:  Type B aortic dissection    History of Present Illness:  Mathew [name] is a 72 y.o. with a past medical history significant for recently diagnosed hepatocellular carcinoma, paroxysmal atrial fibrillation ,HTN, OSA, prostate cancer 2006, and previously treated hepatitis C who presented to the ED via EMS with complaints of sudden onset chest pain that began in his right shoulder and radiated into his chest.  Also associated with some diaphoresis.  He received nitroglycerin and aspirin prehospital.  On ED arrival patient was seen mildly bradycardic and hypertensive.  EKG concerning for possible ST elevation therefore cardiology was consulted and did not see evidence consistent with acute STEMI.  Decision made to administer IV heparin bolus and obtain echo to look for WMA.  Patient underwent CT angio chest abdomen and pelvis to rule out dissection and revealed a thrombosed type B thoracic aortic dissection extending from distal aortic arch just beyond the left subclavian artery to the level of the diaphragm.  Given need for tight hemodynamic control PCCM consulted for further management and admission  Pertinent  Medical History  hepatocellular carcinoma, paroxysmal atrial fibrillation ,HTN, OSA, prostate cancer 2006, and previously treated hepatitis C   Significant Hospital Events: Including procedures, antibiotic start and stop dates in addition to other pertinent events   6/21 presented for sudden onset chest pain workup revealed thrombosed type B thoracic aortic dissection  Interim History / Subjective:  As above  Objective   Blood pressure (!) 131/93, pulse 66, temperature (!) 97.5 F (36.4 C), temperature source Oral, resp. rate 20, height 6\' 3"  (1.905 m), weight 83.9 kg, SpO2 96 %.       No intake or output data in the 24  hours ending 10/25/22 1719 Filed Weights   10/25/22 0917  Weight: 83.9 kg    Examination: General: Acute on chronic ill-appearing thin elderly male lying in bed in no acute distress HEENT: [place]/AT, MM pink/moist, PERRL,  Neuro: Alert and oriented x 3, nonfocal CV: s1s2 regular rate and rhythm, no murmur, rubs, or gallops,  PULM: Clear to auscultation bilaterally, no increased work of breathing, no added breath sounds GI: soft, bowel sounds active in all 4 quadrants, non-tender, non-distended Extremities: warm/dry, no edema  Skin: no rashes or lesion  Resolved Hospital Problem list     Assessment & Plan:  Type B thoracic aortic dissection, thrombosed -CTA chest abdomen and pelvis obtained on admission positive for thrombosed type B thoracic aortic dissection extending from distal aortic arch just beyond the left subclavian artery to the level of the diaphragm.   P- Cardiothoracic surgery consult, appreciate assistance Start and maintain Cleviprex drip for SBP less than 140 Continuous telemetry Ensure adequate pain control If patient becomes tachycardic can consider esmolol drip Clear liquid diet Resume oral home antihypertensives  Mild hyponatremia -Possibly reactive secondary to underlying cancer P: Trend IV hydration  Mild acute kidney injury -Baseline creatinine fluctuates between 0.7-1.1, creatinine on admission 1.27 P: Follow renal function  Monitor urine output Trend Bmet Avoid nephrotoxins Ensure adequate renal perfusion  IV hydration  Metastatic hepatocellular carcinoma -Liver biopsy 08/30/2022 confirmed hepatocellular carcinoma, patient began chemotherapy 3 weeks prior to admission -CTA with newly developed numerous pulmonary nodules likely representing metastatic disease P: Supportive care Follow-up with outpatient oncology   Best Practice (right click and "Reselect all SmartList Selections" daily)  Diet/type: clear liquids DVT prophylaxis: SCD GI  prophylaxis: PPI Lines: N/A Foley:  N/A Code Status:  full code Last date of multidisciplinary goals of care discussion: Pending   Labs   CBC: Recent Labs  Lab 10/23/22 1417 10/25/22 0958  WBC 7.7 6.8  NEUTROABS 3.5  --   HGB 18.4* 17.7*  HCT 55.2* 54.1*  MCV 81.1 83.5  PLT 238 206    Basic Metabolic Panel: Recent Labs  Lab 10/23/22 1417 10/25/22 0945  NA 133* 133*  K 3.0* 3.7  CL 97* 100  CO2 24 23  GLUCOSE 137* 97  BUN 20 16  CREATININE 1.25* 1.27*  CALCIUM 9.8 9.1   GFR: Estimated Creatinine Clearance: 64.2 mL/min (A) (by C-G formula based on SCr of 1.27 mg/dL (H)). Recent Labs  Lab 10/23/22 1417 10/25/22 0958  WBC 7.7 6.8    Liver Function Tests: Recent Labs  Lab 10/23/22 1417 10/25/22 0945  AST 91* 95*  ALT 17 23  ALKPHOS 143* 129*  BILITOT 1.1 1.0  PROT 8.1 8.0  ALBUMIN 3.2* 2.7*   Recent Labs  Lab 10/25/22 1332  LIPASE 25   No results for input(s): "AMMONIA" in the last 168 hours.  ABG    Component Value Date/Time   TCO2 24 11/29/2011 0002     Coagulation Profile: Recent Labs  Lab 10/25/22 1100  INR 1.3*    Cardiac Enzymes: No results for input(s): "CKTOTAL", "CKMB", "CKMBINDEX", "TROPONINI" in the last 168 hours.  HbA1C: Hgb A1c MFr Bld  Date/Time Value Ref Range Status  10/25/2022 09:58 AM 5.7 (H) 4.8 - 5.6 % Final    Comment:    (NOTE) Pre diabetes:          5.7%-6.4%  Diabetes:              >6.4%  Glycemic control for   <7.0% adults with diabetes     CBG: No results for input(s): "GLUCAP" in the last 168 hours.  Review of Systems:   Please see the history of present illness. All other systems reviewed and are negative    Past Medical History:  He,  has a past medical history of Hepatitis C, Hypertension, Obstructive sleep apnea, Premature ventricular contraction, and Prostate cancer (HCC) (2006).   Surgical History:   Past Surgical History:  Procedure Laterality Date   LOOP RECORDER INSERTION N/A  07/04/2016   Procedure: Loop Recorder Insertion;  Surgeon: Hillis Range, MD;  Location: [facility];  Service: Cardiovascular;  Laterality: N/A;   prostectomy     SPLENECTOMY, TOTAL       Social History:   reports that he quit smoking about 2 months ago. His smoking use included cigarettes. He has a 40.00 pack-year smoking history. He has been exposed to tobacco smoke. He has never used smokeless tobacco. He reports current alcohol use of about 2.0 standard drinks of alcohol per week. He reports that he does not use drugs.   Family History:  His family history includes Cancer in his brother. There is no history of Heart attack.   Allergies No Known Allergies   Home Medications  Prior to Admission medications   Medication Sig Start Date End Date Taking? Authorizing Provider  amLODipine (NORVASC) 5 MG tablet Take 1 tablet (5 mg total) by mouth daily. Please make overdue appt with Dr. Johney Frame before anymore refills. 3rd and Final Attempt 01/15/19   Hillis Range, MD  APIXABAN Everlene Balls) VTE STARTER PACK (10MG  AND 5MG ) Take as directed on  package: start with two-5mg  tablets twice daily for 7 days. On day 8, switch to one-5mg  tablet twice daily. 08/20/22   Terald Sleeper, MD  dronabinol (MARINOL) 5 MG capsule Take 1 capsule (5 mg total) by mouth 2 (two) times daily before a meal. 10/23/22   Malachy Mood, MD  escitalopram (LEXAPRO) 10 MG tablet Take 10 mg by mouth daily. 05/27/21   [provider]  losartan (COZAAR) 100 MG tablet Take 1 tablet (100 mg total) by mouth daily. Please make overdue appt with Dr. Johney Frame before anymore refills. 3rd and Final Attempt 01/15/19   Hillis Range, MD  metoCLOPramide (REGLAN) 10 MG tablet Take 1 tablet (10 mg total) by mouth 4 (four) times daily -  before meals and at bedtime. 09/26/22   Pickenpack-Cousar, Arty Baumgartner, NP  metoprolol succinate (TOPROL-XL) 100 MG 24 hr tablet Take 1 tablet (100 mg total) by mouth daily. NEED TO SCHEDULE APPOINTMENT 10/23/22    Malachy Mood, MD  Multiple Vitamin (MULTIVITAMIN WITH MINERALS) TABS tablet Take 1 tablet by mouth daily.    [provider]  ondansetron (ZOFRAN-ODT) 4 MG disintegrating tablet Take 1 tablet (4 mg total) by mouth every 8 (eight) hours as needed for nausea or vomiting. 09/02/22   Benjiman Core, MD  pantoprazole (PROTONIX) 40 MG tablet Take 1 tablet (40 mg total) by mouth daily. 09/04/22   Malachy Mood, MD  pravastatin (PRAVACHOL) 20 MG tablet Take 20 mg by mouth daily. 05/02/21   [provider]  prochlorperazine (COMPAZINE) 10 MG tablet Take 1 tablet (10 mg total) by mouth every 6 (six) hours as needed for nausea or vomiting. 09/04/22   Malachy Mood, MD  promethazine (PHENERGAN) 25 MG tablet Take 1 tablet (25 mg total) by mouth every 8 (eight) hours as needed for nausea. 09/02/22   Benjiman Core, MD  scopolamine (TRANSDERM-SCOP) 1 MG/3DAYS Place 1 patch (1.5 mg total) onto the skin every 3 (three) days. 09/26/22   Pickenpack-Cousar, Arty Baumgartner, NP  valACYclovir (VALTREX) 500 MG tablet Take 1 tablet by mouth daily as needed. For cold sore flare ups 03/09/16   [provider]     Critical care time:    Performed by: Macie Baum D. Harris  Total critical care time: 42 minutes  Critical care time was exclusive of separately billable procedures and treating other patients.  Critical care was necessary to treat or prevent imminent or life-threatening deterioration.  Critical care was time spent personally by me on the following activities: development of treatment plan with patient and/or surrogate as well as nursing, discussions with consultants, evaluation of patient's response to treatment, examination of patient, obtaining history from patient or surrogate, ordering and performing treatments and interventions, ordering and review of laboratory studies, ordering and review of radiographic studies, pulse oximetry and re-evaluation of patient's condition.  Paighton Godette D. Harris,  NP-C [facility] Personal contact information can be found on Amion  If no contact or response made please call [phone] 10/25/2022, 6:25 PM

## 2022-10-25 NOTE — Progress Notes (Signed)
Patient with sub-optimal blood pressure control in the context of aortic dissectioneLink Physician-Brief Progress Note Patient Name: Mathew [name] DOB: 08-18-1950 MRN: [id number]   Date of Service  10/25/2022  HPI/Events of Note  Patient with sub-optimal blood pressure control in the context of aortic dissection.  eICU Interventions  PRN IV Esmolol added to keep SBP < 120 mmHg.        Thomasene Lot Elloise Roark 10/25/2022, 11:17 PM

## 2022-10-25 NOTE — Consult Note (Addendum)
Cardiology Consultation   Patient ID: Mathew [name] MRN: [id number]; DOB: Sep 29, 1950  Admit date: 10/25/2022 Date of Consult: 10/25/2022  PCP:  Patient, No Pcp Per   [facility] Providers Cardiologist:  new to Dr. Excell Seltzer   Patient Profile:   Mathew [name] is a 72 y.o. male with a hx of PAF no longer on OAC, remote prostate cancer s/p prostatectomy, treated Hep C, OSA not on CPAP, HTN, idiopathic pulmonary fibrosis,  who is being seen 10/25/2022 for the evaluation of chest pain and abnormal EKG at the request of Dr. Rosalia Hammers.  History of Present Illness:   [name] previously followed with Dr. Sharyn Lull. He has no history of ischemic heart diease or prior PCI.   Nuclear stress test 2015 nonischemic Heart monitor 2017 with SR, ST, PVCs Nuclear stress test 2018 nonischemic POET 02/2018 was nonischemic with good exercise tolerance.  Echo 03/2018 showed LVEF 60-65%, no RWMA, grade 2 DD, elevated PASP 34 mmHg.   He has a  remote history of difficult to control Afib associated with palpitations and SOB. Heart monitor as above was unrevealing and did not capture Afib. Therefore, he was referred to Dr. Johney Frame and underwent ILR placement 07/2016. IN review of interrogations, no Afib was captured. Given propensity to bleed following hx of prostatectomy, patient preferred to avoid OAC.   CT abdomen for new abdominal bloating, nausea and early satiety showed large 12.6 cm heterogenous liver mass concerning for neoplasm.   Seen in the ER 08/19/22 for abdominal complaints, found to have protal vein thrombosis - started on eliquis treatment does pack.  IR biopsy 08/30/22 showed HCC, not a candidate for liver transplant. Felt not a resection candidate due to high AFP conferring a high risk of recurrence. He was started on eculizumab and bevacizumab. He continued to have significant symptoms and Dr. Mosetta Putt planned to discussed with pancreatobiliary surgeon. He is also following with palliative care. He  has recent poor PO intake, continued nausea and abdominal bloating.   He [facility] to [facility] 10/25/22 with right shoulder pain that radiated to his central chest and felt like a significant pressure. He states this CP is different that his recent abdominal bloating and discomfort. CP started approximately 40 min prior to EMS arrival. He was treated with 324 mg ASA and nitroglycerin x 1. On arrival, pt continued to report CP 7/10. EKG obtained and showed   EKG does not meet STEMI criteria, but did show ST upsloping inferolateral leads, with known TWI V1/V2.   He reports no blood thinner therapy.    Past Medical History:  Diagnosis Date   Hepatitis C    s/p treatment   Hypertension    Obstructive sleep apnea    unable to tolerate CPAP   Premature ventricular contraction    Prostate cancer (HCC) 2006    Past Surgical History:  Procedure Laterality Date   LOOP RECORDER INSERTION N/A 07/04/2016   Procedure: Loop Recorder Insertion;  Surgeon: Hillis Range, MD;  Location: [facility];  Service: Cardiovascular;  Laterality: N/A;   prostectomy     SPLENECTOMY, TOTAL       Home Medications:  Prior to Admission medications   Medication Sig Start Date End Date Taking? Authorizing Provider  amLODipine (NORVASC) 5 MG tablet Take 1 tablet (5 mg total) by mouth daily. Please make overdue appt with Dr. Johney Frame before anymore refills. 3rd and Final Attempt 01/15/19   Hillis Range, MD  APIXABAN Everlene Balls) VTE STARTER PACK (10MG  AND 5MG ) Take  as directed on package: start with two-5mg  tablets twice daily for 7 days. On day 8, switch to one-5mg  tablet twice daily. 08/20/22   Terald Sleeper, MD  dronabinol (MARINOL) 5 MG capsule Take 1 capsule (5 mg total) by mouth 2 (two) times daily before a meal. 10/23/22   Malachy Mood, MD  escitalopram (LEXAPRO) 10 MG tablet Take 10 mg by mouth daily. 05/27/21   [provider]  losartan (COZAAR) 100 MG tablet Take 1 tablet (100 mg total) by mouth daily.  Please make overdue appt with Dr. Johney Frame before anymore refills. 3rd and Final Attempt 01/15/19   Hillis Range, MD  metoCLOPramide (REGLAN) 10 MG tablet Take 1 tablet (10 mg total) by mouth 4 (four) times daily -  before meals and at bedtime. 09/26/22   Pickenpack-Cousar, Arty Baumgartner, NP  metoprolol succinate (TOPROL-XL) 100 MG 24 hr tablet Take 1 tablet (100 mg total) by mouth daily. NEED TO SCHEDULE APPOINTMENT 10/23/22   Malachy Mood, MD  Multiple Vitamin (MULTIVITAMIN WITH MINERALS) TABS tablet Take 1 tablet by mouth daily.    [provider]  ondansetron (ZOFRAN-ODT) 4 MG disintegrating tablet Take 1 tablet (4 mg total) by mouth every 8 (eight) hours as needed for nausea or vomiting. 09/02/22   Benjiman Core, MD  pantoprazole (PROTONIX) 40 MG tablet Take 1 tablet (40 mg total) by mouth daily. 09/04/22   Malachy Mood, MD  pravastatin (PRAVACHOL) 20 MG tablet Take 20 mg by mouth daily. 05/02/21   [provider]  prochlorperazine (COMPAZINE) 10 MG tablet Take 1 tablet (10 mg total) by mouth every 6 (six) hours as needed for nausea or vomiting. 09/04/22   Malachy Mood, MD  promethazine (PHENERGAN) 25 MG tablet Take 1 tablet (25 mg total) by mouth every 8 (eight) hours as needed for nausea. 09/02/22   Benjiman Core, MD  scopolamine (TRANSDERM-SCOP) 1 MG/3DAYS Place 1 patch (1.5 mg total) onto the skin every 3 (three) days. 09/26/22   Pickenpack-Cousar, Arty Baumgartner, NP  valACYclovir (VALTREX) 500 MG tablet Take 1 tablet by mouth daily as needed. For cold sore flare ups 03/09/16   [provider]    Inpatient Medications: Scheduled Meds:  Continuous Infusions:  sodium chloride     nitroGLYCERIN     PRN Meds:   Allergies:   No Known Allergies  Social History:   Social History   Socioeconomic History   Marital status: Married    Spouse name: Not on file   Number of children: 3   Years of education: Not on file   Highest education level: Not on file  Occupational History    Not on file  Tobacco Use   Smoking status: Former    Packs/day: 1.00    Years: 40.00    Additional pack years: 0.00    Total pack years: 40.00    Types: Cigarettes    Quit date: 08/05/2022    Years since quitting: 0.2    Passive exposure: Past   Smokeless tobacco: Never   Tobacco comments:    Smokes 1ppd. May not smoke them all, may throw some of them out.  Never smokes a whole one.  Vaping Use   Vaping Use: Never used  Substance and Sexual Activity   Alcohol use: Yes    Alcohol/week: 2.0 standard drinks of alcohol    Types: 2 Cans of beer per week    Comment: weekends   Drug use: No   Sexual activity: Not on file  Other Topics  Concern   Not on file  Social History Narrative   Pt lives in [place] with spouse.  3 children.  Works as a [employer].   Social Determinants of Health   Financial Resource Strain: Not on file  Food Insecurity: Not on file  Transportation Needs: Not on file  Physical Activity: Not on file  Stress: Not on file  Social Connections: Not on file  Intimate Partner Violence: Not on file    Family History:    Family History  Problem Relation Age of Onset   Cancer Brother        colon cancer and prostate cancer   Heart attack Neg Hx      ROS:  Please see the history of present illness.   All other ROS reviewed and negative.     Physical Exam/Data:   Vitals:   10/25/22 0916 10/25/22 0917  BP: (!) 132/92   Pulse: 60   Resp: 20   Temp: (!) 97.5 F (36.4 C)   TempSrc: Oral   SpO2: 98%   Weight:  83.9 kg  Height:  6\' 3"  (1.905 m)   No intake or output data in the 24 hours ending 10/25/22 1006    10/25/2022    9:17 AM 10/23/2022    2:40 PM 10/02/2022    2:55 PM  Last 3 Weights  Weight (lbs) 185 lb 185 lb 11.2 oz 192 lb 12.8 oz  Weight (kg) 83.915 kg 84.233 kg 87.454 kg     Body mass index is 23.12 kg/m.  General:  Well nourished, well developed, in no acute distress HEENT: normal Neck: no JVD Vascular: No carotid bruits;  Distal pulses 2+ bilaterally Cardiac:  normal S1, S2; RRR; no murmur  Lungs:  clear to auscultation bilaterally, no wheezing, rhonchi or rales  Abd: soft, nontender, no hepatomegaly  Ext: no edema Musculoskeletal:  No deformities, BUE and BLE strength normal and equal Skin: warm and dry  Neuro:  CNs 2-12 intact, no focal abnormalities noted Psych:  Normal affect   EKG:  The EKG was personally reviewed and demonstrates:  sinus bradycardia with HR 53, concerning for ST elevation with upsloping in inferior and lateral leads, TWI v1/2  Telemetry:  Telemetry was personally reviewed and demonstrates:  sinus bradycardia in the 50s  Relevant CV Studies:  Stat echo pending  Laboratory Data:  High Sensitivity Troponin:  No results for input(s): "TROPONINIHS" in the last 720 hours.   Chemistry Recent Labs  Lab 10/23/22 1417  NA 133*  K 3.0*  CL 97*  CO2 24  GLUCOSE 137*  BUN 20  CREATININE 1.25*  CALCIUM 9.8  GFRNONAA >60  ANIONGAP 12    Recent Labs  Lab 10/23/22 1417  PROT 8.1  ALBUMIN 3.2*  AST 91*  ALT 17  ALKPHOS 143*  BILITOT 1.1   Lipids No results for input(s): "CHOL", "TRIG", "HDL", "LABVLDL", "LDLCALC", "CHOLHDL" in the last 168 hours.  Hematology Recent Labs  Lab 10/23/22 1417  WBC 7.7  RBC 6.81*  HGB 18.4*  HCT 55.2*  MCV 81.1  MCH 27.0  MCHC 33.3  RDW 19.8*  PLT 238   Thyroid  Recent Labs  Lab 10/23/22 1417  TSH 1.680    BNPNo results for input(s): "BNP", "PROBNP" in the last 168 hours.  DDimer No results for input(s): "DDIMER" in the last 168 hours.   Radiology/Studies:  No results found.   Assessment and Plan:   Chest pain - pt presented with CP concerning for  unstable angina - no prior ischemic heart disease/PCI - EKG concerning for mild ST elevation with upsloping ST segment - given recent liver cancer diagnosis with large mass, will obtain stat echo and trend CE.  - if troponin return positive with abnormal echo, will then need to  discuss invasive evaluation and possible need for DAPT   PAF No recent recurrence ILR no longer transmitting Sinus brady on telemetry   Hypertension PTA: 100 mg losartan, 5 mg amlodipine    Portal vein thrombosis Placed on treatment pack eliquis 08/20/22 Unclear if patient is taking this   ILD OSA Unable to tolerate CPAP   Prostate cancer s/p prostatectomy - 2006 Liver mass consistent with [facility] - 2024 Hx of Hep C s/p treatment Per primary    Risk Assessment/Risk Scores:       For questions or updates, please contact [facility] Please consult [email or web address] for contact info under    Signed, Marcelino Duster, PA  10/25/2022 10:06 AM  Patient seen, examined. Available data reviewed. Agree with findings, assessment, and plan as outlined by Bettina Gavia, PA-C.  The patient was personally interviewed and examined.  He is in mild discomfort at present.  He complains of discomfort in the abdomen and the central chest.  His pain started in the right shoulder.  Describes this as a pressure-like sensation.  On exam, he is alert and oriented in no acute distress.  Lungs are clear, JVP is normal, there are no carotid bruits, HEENT is normal, heart is regular rate and rhythm without murmur or gallop, abdomen is soft, mildly distended, with mild diffuse tenderness but no rebound or guarding, bowel sounds are present.  Extremities have no edema.  Skin is warm and dry with no rash.  EKG shows normal sinus rhythm with inferolateral upsloping ST elevation, borderline approximately 1 mm.  Cardiac biomarkers are pending.  The patient's history is complicated with recent portal vein thrombosis and diagnosis of hepatocellular cancer with a large liver mass.  I am not convinced that he is having an acute ST segment elevation MI.  His EKG is borderline but not diagnostic of STEMI.  He has received a single bolus of IV heparin.  Recommend stat 2D echocardiogram, cycle cardiac biomarkers, and  further plans pending his echo and biomarker results.  If no regional wall motion abnormalities and no elevation of cardiac enzymes, I would not recommend invasive cardiac evaluation in the context of his recently diagnosed hepatocellular cancer.  Tonny Bollman, M.D. 10/25/2022 11:03 AM

## 2022-10-25 NOTE — ED Notes (Signed)
Pt requested a hot pack for back. I gave pt a hot pack for back.

## 2022-10-26 ENCOUNTER — Other Ambulatory Visit: Payer: Self-pay | Admitting: Hematology and Oncology

## 2022-10-26 DIAGNOSIS — C799 Secondary malignant neoplasm of unspecified site: Secondary | ICD-10-CM | POA: Diagnosis not present

## 2022-10-26 DIAGNOSIS — I719 Aortic aneurysm of unspecified site, without rupture: Secondary | ICD-10-CM

## 2022-10-26 DIAGNOSIS — I81 Portal vein thrombosis: Secondary | ICD-10-CM | POA: Diagnosis not present

## 2022-10-26 DIAGNOSIS — C220 Liver cell carcinoma: Secondary | ICD-10-CM | POA: Diagnosis not present

## 2022-10-26 DIAGNOSIS — Z515 Encounter for palliative care: Secondary | ICD-10-CM | POA: Diagnosis not present

## 2022-10-26 DIAGNOSIS — I71012 Dissection of descending thoracic aorta: Secondary | ICD-10-CM

## 2022-10-26 DIAGNOSIS — Z66 Do not resuscitate: Secondary | ICD-10-CM | POA: Diagnosis not present

## 2022-10-26 DIAGNOSIS — I71019 Dissection of thoracic aorta, unspecified: Secondary | ICD-10-CM | POA: Diagnosis not present

## 2022-10-26 DIAGNOSIS — E782 Mixed hyperlipidemia: Secondary | ICD-10-CM

## 2022-10-26 LAB — CBC
HCT: 55.8 % — ABNORMAL HIGH (ref 39.0–52.0)
Hemoglobin: 18.5 g/dL — ABNORMAL HIGH (ref 13.0–17.0)
MCH: 27 pg (ref 26.0–34.0)
MCHC: 33.2 g/dL (ref 30.0–36.0)
MCV: 81.6 fL (ref 80.0–100.0)
Platelets: 232 10*3/uL (ref 150–400)
RBC: 6.84 MIL/uL — ABNORMAL HIGH (ref 4.22–5.81)
RDW: 20.2 % — ABNORMAL HIGH (ref 11.5–15.5)
WBC: 9.1 10*3/uL (ref 4.0–10.5)
nRBC: 0.2 % (ref 0.0–0.2)

## 2022-10-26 LAB — HIV ANTIBODY (ROUTINE TESTING W REFLEX): HIV Screen 4th Generation wRfx: NONREACTIVE

## 2022-10-26 MED ORDER — MELATONIN 3 MG PO TABS
3.0000 mg | ORAL_TABLET | Freq: Every evening | ORAL | Status: DC | PRN
  Administered 2022-10-26: 3 mg via ORAL
  Filled 2022-10-26: qty 1

## 2022-10-26 MED ORDER — DRONABINOL 5 MG PO CAPS: 5.0000 mg | ORAL_CAPSULE | Freq: Two times a day (BID) | ORAL | 0 refills | Status: DC

## 2022-10-26 MED ORDER — AMLODIPINE BESYLATE 10 MG PO TABS
10.0000 mg | ORAL_TABLET | Freq: Every day | ORAL | Status: DC
  Administered 2022-10-26 – 2022-10-29 (×4): 10 mg via ORAL
  Filled 2022-10-26 (×4): qty 1

## 2022-10-26 MED ORDER — LOSARTAN POTASSIUM 50 MG PO TABS
100.0000 mg | ORAL_TABLET | Freq: Every day | ORAL | Status: DC
  Administered 2022-10-26 – 2022-10-29 (×4): 100 mg via ORAL
  Filled 2022-10-26 (×4): qty 2

## 2022-10-26 MED ORDER — ESCITALOPRAM OXALATE 10 MG PO TABS
10.0000 mg | ORAL_TABLET | Freq: Every day | ORAL | Status: DC
  Administered 2022-10-26 – 2022-10-29 (×4): 10 mg via ORAL
  Filled 2022-10-26 (×4): qty 1

## 2022-10-26 MED ORDER — ATORVASTATIN CALCIUM 80 MG PO TABS
80.0000 mg | ORAL_TABLET | Freq: Every day | ORAL | Status: DC
  Administered 2022-10-26 – 2022-10-28 (×3): 80 mg via ORAL
  Filled 2022-10-26 (×3): qty 1

## 2022-10-26 MED ORDER — METOPROLOL SUCCINATE ER 50 MG PO TB24
100.0000 mg | ORAL_TABLET | Freq: Every day | ORAL | Status: DC
  Administered 2022-10-26: 100 mg via ORAL
  Filled 2022-10-26: qty 2

## 2022-10-26 NOTE — Progress Notes (Signed)
NAME:  Mathew [name], MRN:  [id number], DOB:  December 12, 1950, LOS: 1 ADMISSION DATE:  10/25/2022, CONSULTATION DATE:  10/25/2022 REFERRING MD:  Dr. Rosalia Hammers - EDP, CHIEF COMPLAINT:  Type B aortic dissection    History of Present Illness:  Mathew [name] is a 72 y.o. with a past medical history significant for recently diagnosed hepatocellular carcinoma, paroxysmal atrial fibrillation ,HTN, OSA, prostate cancer 2006, and previously treated hepatitis C who presented to the ED via EMS with complaints of sudden onset chest pain that began in his right shoulder and radiated into his chest.  Also associated with some diaphoresis.  He received nitroglycerin and aspirin prehospital.  On ED arrival patient was seen mildly bradycardic and hypertensive.  EKG concerning for possible ST elevation therefore cardiology was consulted and did not see evidence consistent with acute STEMI.  Decision made to administer IV heparin bolus and obtain echo to look for WMA.  Patient underwent CT angio chest abdomen and pelvis to rule out dissection and revealed a thrombosed type B thoracic aortic dissection extending from distal aortic arch just beyond the left subclavian artery to the level of the diaphragm.  Given need for tight hemodynamic control PCCM consulted for further management and admission  Pertinent  Medical History  hepatocellular carcinoma, paroxysmal atrial fibrillation ,HTN, OSA, prostate cancer 2006, and previously treated hepatitis C   Significant Hospital Events: Including procedures, antibiotic start and stop dates in addition to other pertinent events   6/21 presented for sudden onset chest pain workup revealed thrombosed type B thoracic aortic dissection  Interim History / Subjective:  Patient continued to complain of chest pressure, stated abdominal pain has resolved Remain on Cleviprex and esmolol infusion  Objective   Blood pressure 116/80, pulse 61, temperature 97.7 F (36.5 C), temperature source Oral,  resp. rate 16, height 6\' 3"  (1.905 m), weight 85.6 kg, SpO2 91 %.        Intake/Output Summary (Last 24 hours) at 10/26/2022 1055 Last data filed at 10/26/2022 0800 Gross per 24 hour  Intake 2274.1 ml  Output 450 ml  Net 1824.1 ml   Filed Weights   10/25/22 0917 10/26/22 0500  Weight: 83.9 kg 85.6 kg    Examination: Physical exam: General: Elderly male, lying on the bed HEENT: [place]/AT, eyes anicteric.  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 and prostate cancer Liver biopsy 08/30/2022 confirmed hepatocellular carcinoma, patient began chemotherapy 3 weeks prior to admission CTA with newly developed numerous pulmonary nodules likely representing metastatic disease Outpatient follow-up with oncology  Best Practice (right click and "Reselect all SmartList Selections"  daily)   Diet/type: Advance diet DVT prophylaxis: SCD GI prophylaxis: N/A Lines: N/A Foley:  N/A Code Status:  full code Last date of multidisciplinary goals of care discussion: 6/22: Patient was updated at bedside, decision was to continue full scope of care  Labs   CBC: Recent Labs  Lab 10/23/22 1417 10/25/22 0958 10/26/22 0417  WBC 7.7 6.8 9.1  NEUTROABS 3.5  --   --   HGB 18.4* 17.7* 18.5*  HCT 55.2* 54.1* 55.8*  MCV 81.1 83.5 81.6  PLT 238 206 232    Basic Metabolic Panel: Recent Labs  Lab 10/23/22 1417 10/25/22 0945  NA 133* 133*  K 3.0* 3.7  CL 97* 100  CO2 24 23  GLUCOSE 137* 97  BUN 20 16  CREATININE 1.25* 1.27*  CALCIUM 9.8 9.1   GFR: Estimated Creatinine Clearance: 64.7 mL/min (A) (by C-G formula based on SCr of 1.27 mg/dL (H)). Recent Labs  Lab 10/23/22 1417 10/25/22 0958 10/26/22 0417  WBC 7.7 6.8 9.1    Liver Function Tests: Recent Labs  Lab 10/23/22 1417 10/25/22 0945  AST 91* 95*  ALT 17 23  ALKPHOS 143* 129*  BILITOT 1.1 1.0  PROT 8.1 8.0  ALBUMIN 3.2* 2.7*   Recent Labs  Lab 10/25/22 1332  LIPASE 25   No results for input(s): "AMMONIA" in the last 168 hours.  ABG    Component Value Date/Time   TCO2 24 11/29/2011 0002     Coagulation Profile: Recent Labs  Lab 10/25/22 1100  INR 1.3*    Cardiac Enzymes: No results for input(s): "CKTOTAL", "CKMB", "CKMBINDEX", "TROPONINI" in the last 168 hours.  HbA1C: Hgb A1c MFr Bld  Date/Time Value Ref Range Status  10/25/2022 09:58 AM 5.7 (H) 4.8 - 5.6 % Final    Comment:    (NOTE) Pre diabetes:          5.7%-6.4%  Diabetes:              >6.4%  Glycemic control for   <7.0% adults with diabetes     CBG: No results for input(s): "GLUCAP" in the last 168 hours.   The patient is critically ill due to type B aortic dissection, uncontrolled hypertension, AKI.  Critical care was necessary to treat or prevent imminent or life-threatening deterioration.  Critical care was  time spent personally by me on the following activities: development of treatment plan with patient and/or surrogate as well as nursing, discussions with consultants, evaluation of patient's response to treatment, examination of patient, obtaining history from patient or surrogate, ordering and performing treatments and interventions, ordering and review of laboratory studies, ordering and review of radiographic studies, pulse oximetry, re-evaluation of patient's condition and participation in multidisciplinary rounds.   During this encounter critical care time was devoted to patient care services described in this note for 33 minutes.     Cheri Fowler, MD [facility] See Amion for pager If no response to pager, please call [phone] until 7pm After 7pm, Please call E-link [phone]

## 2022-10-26 NOTE — Consult Note (Signed)
ASSESSMENT & PLAN   INTRAMURAL HEMATOMA THORACIC AORTA: This patient presented with chest and abdominal pain and was found to have a significant intramural hematoma versus a thrombosed type B aortic dissection.  There is no compromise of his renals or mesenteric vessels or lower extremities.  His pain has resolved now that his blood pressure is under control.  Will continue to manage his blood pressure carefully and we will plan on a follow-up CT angio early next week.  No plans for surgical intervention at this point.  REASON FOR CONSULT:    Type B aortic dissection.  The consult is requested by Dr. Merrily Pew.   HPI:   Mathew [name] is a 72 y.o. male who was admitted on 10/25/2022 with chest pain and abdominal pain.  His workup included a CT angio of the chest and abdomen which shows evidence of a type B aortic dissection potentially.  For this reason vascular surgery was consulted.  The patient has a known hepatocellular carcinoma.  He has been getting chemotherapy and has had some nausea and abdominal pain potentially related to his therapy.  He noted the onset of right shoulder pain chest pain and abdominal pain prior to admission.  When he was admitted his blood pressure was elevated.  His blood pressure now been better controlled and his pain has essentially resolved.  He denies any history of claudication or rest pain.  He is currently not having significant chest pain or abdominal pain.  He is a smoker.  Past Medical History:  Diagnosis Date   Hepatitis C    s/p treatment   Hypertension    Obstructive sleep apnea    unable to tolerate CPAP   Premature ventricular contraction    Prostate cancer (HCC) 2006    Family History  Problem Relation Age of Onset   Cancer Brother        colon cancer and prostate cancer   Heart attack Neg Hx     SOCIAL HISTORY: Social History   Tobacco Use   Smoking status: Former    Packs/day: 1.00    Years: 40.00    Additional pack years: 0.00     Total pack years: 40.00    Types: Cigarettes    Quit date: 08/05/2022    Years since quitting: 0.2    Passive exposure: Past   Smokeless tobacco: Never   Tobacco comments:    Smokes 1ppd. May not smoke them all, may throw some of them out.  Never smokes a whole one.  Substance Use Topics   Alcohol use: Yes    Alcohol/week: 2.0 standard drinks of alcohol    Types: 2 Cans of beer per week    Comment: weekends    No Known Allergies  Current Facility-Administered Medications  Medication Dose Route Frequency Provider Last Rate Last Admin   0.9 %  sodium chloride infusion   Intravenous Continuous Margarita Grizzle, MD   Stopped at 10/25/22 2000   amLODipine (NORVASC) tablet 10 mg  10 mg Oral Daily Cheri Fowler, MD   10 mg at 10/26/22 1120   atorvastatin (LIPITOR) tablet 80 mg  80 mg Oral QHS Hilty, Lisette Abu, MD       Chlorhexidine Gluconate Cloth 2 % PADS 6 each  6 each Topical Daily Cheri Fowler, MD   6 each at 10/25/22 2015   clevidipine (CLEVIPREX) infusion 0.5 mg/mL  0-21 mg/hr Intravenous Continuous Janeann Forehand D, NP 42 mL/hr at 10/26/22 1100 21 mg/hr at 10/26/22 1100  docusate sodium (COLACE) capsule 100 mg  100 mg Oral BID PRN Cheri Fowler, MD       escitalopram (LEXAPRO) tablet 10 mg  10 mg Oral Daily Cheri Fowler, MD   10 mg at 10/26/22 1120   esmolol (BREVIBLOC) 2000 mg / 100 mL (20 mg/mL) infusion  0-300 mcg/kg/min Intravenous Titrated Migdalia Dk, MD 25.2 mL/hr at 10/26/22 1123 100 mcg/kg/min at 10/26/22 1123   feeding supplement (BOOST / RESOURCE BREEZE) liquid 1 Container  1 Container Oral TID BM Cheri Fowler, MD   1 Container at 10/26/22 1038   losartan (COZAAR) tablet 100 mg  100 mg Oral Daily Cheri Fowler, MD   100 mg at 10/26/22 1119   metoprolol succinate (TOPROL-XL) 24 hr tablet 100 mg  100 mg Oral Daily Cheri Fowler, MD   100 mg at 10/26/22 1119   morphine (PF) 2 MG/ML injection 2 mg  2 mg Intravenous Q3H PRN Cheri Fowler, MD   2 mg at 10/26/22 0033    Oral care mouth rinse  15 mL Mouth Rinse PRN Cheri Fowler, MD       polyethylene glycol (MIRALAX / GLYCOLAX) packet 17 g  17 g Oral Daily PRN Cheri Fowler, MD        REVIEW OF SYSTEMS:  [X]  denotes positive finding, [ ]  denotes negative finding Cardiac  Comments:  Chest pain or chest pressure:    Shortness of breath upon exertion: x   Short of breath when lying flat:    Irregular heart rhythm:        Vascular    Pain in calf, thigh, or hip brought on by ambulation:    Pain in feet at night that wakes you up from your sleep:     Blood clot in your veins:    Leg swelling:         Pulmonary    Oxygen at home:    Productive cough:     Wheezing:         Neurologic    Sudden weakness in arms or legs:     Sudden numbness in arms or legs:     Sudden onset of difficulty speaking or slurred speech:    Temporary loss of vision in one eye:     Problems with dizziness:         Gastrointestinal    Blood in stool:     Vomited blood:         Genitourinary    Burning when urinating:     Blood in urine:        Psychiatric    Major depression:         Hematologic    Bleeding problems:    Problems with blood clotting too easily:        Skin    Rashes or ulcers:        Constitutional    Fever or chills:    -  PHYSICAL EXAM:   Vitals:   10/26/22 1100 10/26/22 1115 10/26/22 1119 10/26/22 1130  BP: 119/81 114/79 114/79 117/78  Pulse: 62 62  64  Resp: 17 17  18   Temp:      TempSrc:      SpO2: 92% 90%  91%  Weight:      Height:       Body mass index is 23.59 kg/m. GENERAL: The patient is a well-nourished male, in no acute distress. The vital signs are documented above. CARDIAC: There is a regular rate  and rhythm.  VASCULAR: I do not detect carotid bruits. He has palpable radial pulses bilaterally. He has palpable femoral, dorsalis pedis, and posterior tibial pulses bilaterally. PULMONARY: There is good air exchange bilaterally without wheezing or rales. ABDOMEN: Soft  and non-tender with normal pitched bowel sounds.  MUSCULOSKELETAL: There are no major deformities. NEUROLOGIC: No focal weakness or paresthesias are detected. SKIN: There are no ulcers or rashes noted. PSYCHIATRIC: The patient has a normal affect.  DATA:    CT CHEST ABDOMEN PELVIS: This patient has an abnormality of the thoracic aorta which begins just distal to the takeoff of the left subclavian artery and ends at the diaphragmatic hiatus.  This could represent intramural hematoma or a thrombosed type B dissection.  I do not see any obvious penetrating ulcers or other complicating features.  Other findings include a large mass in the right lobe of the liver compatible with the patient's known hepatocellular carcinoma.  There are multiple small pulmonary nodules that are new consistent with metastatic disease.  LABS: I reviewed his labs from yesterday.  His creatinine was 1.27.  GFR was greater than 60.  Waverly Ferrari [facility]

## 2022-10-26 NOTE — Progress Notes (Signed)
DAILY PROGRESS NOTE   Patient Name: Mathew [name] Date of Encounter: 10/26/2022 Cardiologist: None  Chief Complaint   Mild upper abdominal pain  Patient Profile   Gabreal Worton is a 72 y.o. male with a hx of PAF no longer on OAC, remote prostate cancer s/p prostatectomy, treated Hep C, OSA not on CPAP, HTN, idiopathic pulmonary fibrosis,  who is being seen 10/25/2022 for the evaluation of chest pain and abnormal EKG at the request of Dr. Rosalia Hammers.   Subjective   Hypertensive overnight -per PCCM notes. 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 Troponins were negative x 3. Echo yesterday personally reviewed, demonstrates normal LVEF 55%, grade 1 DD, high RA pressure, no significant valve disease and normal wall motion.  CT angio demonstrated dissection from the distal aortic arch beyond the left subclavian artery origin to the level of the diaphragmatic hiatus. False lumen diameter of 1.3 cm. There was again note of an enlarging necrotic right lobe liver mass consistent with known hepatocellular carcinoma.  Objective   Vitals:   10/26/22 0730 10/26/22 0745 10/26/22 0800 10/26/22 0819  BP: 122/80 122/78 116/80   Pulse: 63 64 61   Resp: 18 19 16    Temp:    97.7 F (36.5 C)  TempSrc:    Oral  SpO2: 92% 94% 91%   Weight:      Height:        Intake/Output Summary (Last 24 hours) at 10/26/2022 6962 Last data filed at 10/26/2022 0800 Gross per 24 hour  Intake 2274.1 ml  Output 450 ml  Net 1824.1 ml   Filed Weights   10/25/22 0917 10/26/22 0500  Weight: 83.9 kg 85.6 kg    Physical Exam   General appearance: alert and no distress Neck: no carotid bruit, no JVD, and thyroid not enlarged, symmetric, no tenderness/mass/nodules Lungs: clear to auscultation bilaterally Heart: regular rate and rhythm, S1, S2 normal, no murmur, click, rub or gallop Abdomen: soft, non-tender; bowel sounds normal; no masses,  no organomegaly Extremities: extremities normal, atraumatic, no  cyanosis or edema Pulses: 2+ and symmetric Skin: Skin color, texture, turgor normal. No rashes or lesions Neurologic: Grossly normal Psych: Pleasant  Inpatient Medications    Scheduled Meds:  Chlorhexidine Gluconate Cloth  6 each Topical Daily   feeding supplement  1 Container Oral TID BM    Continuous Infusions:  sodium chloride Stopped (10/25/22 2000)   clevidipine 21 mg/hr (10/26/22 0800)   esmolol 125 mcg/kg/min (10/26/22 0800)   lactated ringers 75 mL/hr at 10/26/22 0800   nitroGLYCERIN Stopped (10/25/22 1754)    PRN Meds: docusate sodium, morphine injection, mouth rinse, polyethylene glycol   Labs   Results for orders placed or performed during the hospital encounter of 10/25/22 (from the past 48 hour(s))  Comprehensive metabolic panel     Status: Abnormal   Collection Time: 10/25/22  9:45 AM  Result Value Ref Range   Sodium 133 (L) 135 - 145 mmol/L   Potassium 3.7 3.5 - 5.1 mmol/L   Chloride 100 98 - 111 mmol/L   CO2 23 22 - 32 mmol/L   Glucose, Bld 97 70 - 99 mg/dL    Comment: Glucose reference range applies only to samples taken after fasting for at least 8 hours.   BUN 16 8 - 23 mg/dL   Creatinine, Ser 9.52 (H) 0.61 - 1.24 mg/dL   Calcium 9.1 8.9 - 84.1 mg/dL   Total Protein 8.0 6.5 - 8.1 g/dL   Albumin 2.7 (  L) 3.5 - 5.0 g/dL   AST 95 (H) 15 - 41 U/L   ALT 23 0 - 44 U/L   Alkaline Phosphatase 129 (H) 38 - 126 U/L   Total Bilirubin 1.0 0.3 - 1.2 mg/dL   GFR, Estimated >69 >62 mL/min    Comment: (NOTE) Calculated using the CKD-EPI Creatinine Equation (2021)    Anion gap 10 5 - 15    Comment: Performed at [facility], [address] [place], [place] [address]  CBC     Status: Abnormal   Collection Time: 10/25/22  9:58 AM  Result Value Ref Range   WBC 6.8 4.0 - 10.5 K/uL   RBC 6.48 (H) 4.22 - 5.81 MIL/uL   Hemoglobin 17.7 (H) 13.0 - 17.0 g/dL   HCT 13.2 (H) 44.0 - 10.2 %   MCV 83.5 80.0 - 100.0 fL   MCH 27.3 26.0 - 34.0 pg   MCHC 32.7 30.0 -  36.0 g/dL   RDW 72.5 (H) 36.6 - 44.0 %   Platelets 206 150 - 400 K/uL    Comment: REPEATED TO VERIFY   nRBC 0.0 0.0 - 0.2 %    Comment: Performed at [facility], [address] [place], [place] [address]  Hemoglobin A1c     Status: Abnormal   Collection Time: 10/25/22  9:58 AM  Result Value Ref Range   Hgb A1c MFr Bld 5.7 (H) 4.8 - 5.6 %    Comment: (NOTE) Pre diabetes:          5.7%-6.4%  Diabetes:              >6.4%  Glycemic control for   <7.0% adults with diabetes    Mean Plasma Glucose 116.89 mg/dL    Comment: Performed at [facility], [address] [place], [place] [address]  Lipid panel     Status: Abnormal   Collection Time: 10/25/22 11:00 AM  Result Value Ref Range   Cholesterol 316 (H) 0 - 200 mg/dL   Triglycerides 77 <875 mg/dL   HDL 38 (L) >64 mg/dL   Total CHOL/HDL Ratio 8.3 RATIO   VLDL 15 0 - 40 mg/dL   LDL Cholesterol 332 (H) 0 - 99 mg/dL    Comment:        Total Cholesterol/HDL:CHD Risk Coronary Heart Disease Risk Table                     Men   Women  1/2 Average Risk   3.4   3.3  Average Risk       5.0   4.4  2 X Average Risk   9.6   7.1  3 X Average Risk  23.4   11.0        Use the calculated Patient Ratio above and the CHD Risk Table to determine the patient's CHD Risk.        ATP III CLASSIFICATION (LDL):  <100     mg/dL   Optimal  951-884  mg/dL   Near or Above                    Optimal  130-159  mg/dL   Borderline  166-063  mg/dL   High  >016     mg/dL   Very High Performed at [facility], [address] [place], [place] [address]   Protime-INR     Status: Abnormal   Collection Time: 10/25/22 11:00  AM  Result Value Ref Range   Prothrombin Time 16.0 (H) 11.4 - 15.2 seconds   INR 1.3 (H) 0.8 - 1.2    Comment: (NOTE) INR goal varies based on device and disease states. Performed at [facility], [address] [place], [place] [address]   APTT     Status: Abnormal   Collection Time: 10/25/22 11:00 AM   Result Value Ref Range   aPTT 40 (H) 24 - 36 seconds    Comment:        IF BASELINE aPTT IS ELEVATED, SUGGEST PATIENT RISK ASSESSMENT BE USED TO DETERMINE APPROPRIATE ANTICOAGULANT THERAPY. Performed at [facility], [address], [place], [place] [address]   Troponin I (High Sensitivity)     Status: None   Collection Time: 10/25/22 11:00 AM  Result Value Ref Range   Troponin I (High Sensitivity) 15 <18 ng/L    Comment: (NOTE) Elevated high sensitivity troponin I (hsTnI) values and significant  changes across serial measurements may suggest ACS but many other  chronic and acute conditions are known to elevate hsTnI results.  Refer to the "Links" section for chest pain algorithms and additional  guidance. Performed at [facility], [address], [place], [place] [address]   Troponin I (High Sensitivity)     Status: None   Collection Time: 10/25/22  1:32 PM  Result Value Ref Range   Troponin I (High Sensitivity) 14 <18 ng/L    Comment: (NOTE) Elevated high sensitivity troponin I (hsTnI) values and significant  changes across serial measurements may suggest ACS but many other  chronic and acute conditions are known to elevate hsTnI results.  Refer to the "Links" section for chest pain algorithms and additional  guidance. Performed at [facility], [address] [place], [place] [address]   Lipase, blood     Status: None   Collection Time: 10/25/22  1:32 PM  Result Value Ref Range   Lipase 25 11 - 51 U/L    Comment: Performed at [facility], [address], [place], [place] [address]  MRSA Next Gen by PCR, Nasal     Status: None   Collection Time: 10/25/22  8:16 PM   Specimen: Nasal Mucosa; Nasal Swab  Result Value Ref Range   MRSA by PCR Next Gen NOT DETECTED NOT DETECTED    Comment: (NOTE) The GeneXpert MRSA Assay (FDA approved for NASAL specimens only), is one component of a comprehensive MRSA colonization surveillance program. It is not  intended to diagnose MRSA infection nor to guide or monitor treatment for MRSA infections. Test performance is not FDA approved in patients less than 58 years old. Performed at [facility], [address] [place], [place] [address]   CBC     Status: Abnormal   Collection Time: 10/26/22  4:17 AM  Result Value Ref Range   WBC 9.1 4.0 - 10.5 K/uL   RBC 6.84 (H) 4.22 - 5.81 MIL/uL   Hemoglobin 18.5 (H) 13.0 - 17.0 g/dL   HCT 51.0 (H) 25.8 - 52.7 %   MCV 81.6 80.0 - 100.0 fL   MCH 27.0 26.0 - 34.0 pg   MCHC 33.2 30.0 - 36.0 g/dL   RDW 78.2 (H) 42.3 - 53.6 %   Platelets 232 150 - 400 K/uL   nRBC 0.2 0.0 - 0.2 %    Comment: Performed at [facility], [address], [place], [place] [address]  ECG   N/A  Telemetry   Sinus rhythm (no ST elevation) - Personally Reviewed  Radiology    CT Angio Chest/Abd/Pel for Dissection W and/or Wo Contrast  Result Date: 10/25/2022 CLINICAL DATA:  Worsening severe substernal chest pain radiating from right shoulder to back, history of hepatocellular carcinoma EXAM: CT ANGIOGRAPHY CHEST, ABDOMEN AND PELVIS TECHNIQUE: Non-contrast CT of the chest was initially obtained. Multidetector CT imaging through the chest, abdomen and pelvis was performed using the standard protocol during bolus administration of intravenous contrast. Multiplanar reconstructed images and MIPs were obtained and reviewed to evaluate the vascular anatomy. RADIATION DOSE REDUCTION: This exam was performed according to the departmental dose-optimization program which includes automated exposure control, adjustment of the mA and/or kV according to patient size and/or use of iterative reconstruction technique. CONTRAST:  75mL OMNIPAQUE IOHEXOL 350 MG/ML SOLN COMPARISON:  09/10/2022, 09/20/2022, 08/19/2022 FINDINGS: CTA CHEST FINDINGS Cardiovascular: There is either intramural hematoma or thrombosed dissection of the descending thoracic aorta, extending from the distal arch  just beyond the left subclavian artery origin to the level of the diaphragmatic hiatus. Maximal thickness of the intramural hematoma or thrombosed dissection measures up to 1.3 cm at the level of the proximal descending thoracic aorta reference image 43/6. This has developed since the prior CT scans performed 08/19/2022 and 09/10/2022. There is no periaortic fat stranding to suggest leak or rupture. No significant stenosis within the true lumen of the aorta. Minimal calcified atheromatous plaque within the distal aortic arch and descending thoracic aorta. No evidence of thoracic aortic aneurysm. The heart is unremarkable without pericardial effusion. Timing of the contrast bolus is suboptimal for evaluation of the pulmonary vasculature. Mediastinum/Nodes: No enlarged mediastinal, hilar, or axillary lymph nodes. Thyroid gland, trachea, and esophagus demonstrate no significant findings. Lungs/Pleura: No acute airspace disease, effusion, or pneumothorax. Minimal bibasilar hypoventilatory changes. Mild emphysema. There are multiple small pulmonary nodules that have developed in the interim, consistent with metastatic disease. Index nodules are as follows: Left lower lobe, image 74/11, 7 mm. Left upper lobe, image 30/11, 4 mm. Right upper lobe, image 61/11, 6 mm. Central airways are patent. Musculoskeletal: No acute or destructive bony abnormalities. Reconstructed images demonstrate no additional findings. Review of the MIP images confirms the above findings. CTA ABDOMEN AND PELVIS FINDINGS VASCULAR Aorta: Normal caliber aorta without aneurysm, dissection, vasculitis or significant stenosis. Celiac: Patent without evidence of aneurysm, dissection, vasculitis or significant stenosis. SMA: Patent without evidence of aneurysm, dissection, vasculitis or significant stenosis. Renals: Both renal arteries are patent without evidence of aneurysm, dissection, vasculitis, fibromuscular dysplasia or significant stenosis. IMA:  Patent without evidence of aneurysm, dissection, vasculitis or significant stenosis. Inflow: Patent without evidence of aneurysm, dissection, vasculitis or significant stenosis. Veins: No obvious venous abnormality within the limitations of this arterial phase study. Review of the MIP images confirms the above findings. NON-VASCULAR Hepatobiliary: Large necrotic mass within the right lobe liver measuring up to 14.8 x 13.5 cm compatible with the patient's known hepatocellular carcinoma. Exact comparison to prior imaging is difficult due to differences in phase of contrast enhancement, though subjectively the mass appears larger than the previous abdominal CT. Gallbladder is unremarkable. No biliary duct dilation. Pancreas: Unremarkable. No pancreatic ductal dilatation or surrounding inflammatory changes. Spleen: Small residual splenules in the left upper quadrant are unchanged. Adrenals/Urinary Tract: The adrenals are stable. The presumed bilateral renal cell carcinoma seen on recent MRI are again noted, measuring up to 2.6 x 2.1 cm on the right reference image 132/6 and 1.0 x  0.7 cm on the left reference image 127/6. No urinary tract calculi or obstructive uropathy within either kidney. Bladder is unremarkable. Stomach/Bowel: No bowel obstruction or ileus. No bowel wall thickening or inflammatory change. Lymphatic: No pathologic adenopathy within the abdomen or pelvis. Reproductive: Prostate is unremarkable. Other: No free fluid or free intraperitoneal gas. Small fat containing umbilical hernia. No bowel herniation. Musculoskeletal: No acute or destructive bony abnormalities. Reconstructed images demonstrate no additional findings. Review of the MIP images confirms the above findings. IMPRESSION: Vascular: 1. 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 Maximal thickness of the  thrombosed false lumen of the dissection measures 1.3 cm. No evidence of true luminal narrowing or aneurysm. 2. Unremarkable abdominal aorta. 3.  Aortic Atherosclerosis (ICD10-I70.0). Nonvascular: 1. Enlarging necrotic right lobe liver mass consistent with progressive hepatocellular carcinoma. 2. Interval development of numerous bilateral pulmonary nodules consistent with pulmonary metastatic disease. 3. 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 4.  Emphysema (ICD10-J43.9). Critical Value/emergent results were called by telephone at the time of interpretation on 10/25/2022 at 4:47 pm to provider [facility] RAY , who verbally acknowledged these results. Electronically Signed   By: Sharlet Salina M.D.   On: 10/25/2022 16:58   DG Chest Portable 1 View  Result Date: 10/25/2022 CLINICAL DATA:  Chest pain EXAM: PORTABLE CHEST 1 VIEW COMPARISON:  Chest radiograph 09/02/2022 FINDINGS: The cardiomediastinal silhouette is normal. A loop recorder is again noted. There is asymmetric elevation of the right hemidiaphragm. There is no acute osseous abnormality. Coarsened interstitial markings primarily in the lower lobes correspond to changes of chronic interstitial lung disease as seen on recent CT chest. There is no superimposed acute consolidation. There is no pulmonary edema. There is no pleural effusion or pneumothorax. IMPRESSION: No radiographic evidence of acute cardiopulmonary process. Electronically Signed   By: Lesia Hausen M.D.   On: 10/25/2022 11:48   ECHOCARDIOGRAM COMPLETE  Result Date: 10/25/2022    ECHOCARDIOGRAM REPORT   Patient Name:   Mathew [name] Date of Exam: 10/25/2022 Medical Rec #:  [id number]    Height:       75.0 in Accession #:    [id number]   Weight:       185.0 lb Date of Birth:  May 11, 1950   BSA:          2.123 m Patient Age:    71 years     BP:           148/99 mmHg Patient Gender: M            HR:           54 bpm. Exam Location:  Inpatient Procedure: 2D Echo, Color Doppler  and Cardiac Doppler STAT ECHO Indications:    R07.9* Chest pain, unspecified  History:        Patient has prior history of Echocardiogram examinations, most                 recent 03/26/2018. Risk Factors:Hypertension and Sleep Apnea.  Sonographer:    Irving Burton Senior RDCS Referring Phys: [phone] MICHAEL COOPER IMPRESSIONS  1. Left ventricular ejection fraction, by estimation, is 55%. The left ventricle has normal function. The left ventricle has no regional wall motion abnormalities. There is moderate asymmetric left ventricular hypertrophy of the septal segment. Left ventricular diastolic parameters are consistent with Grade I diastolic dysfunction (impaired relaxation).  2. Right  ventricular systolic function is normal. The right ventricular size is normal. There is normal pulmonary artery systolic pressure.  3. The mitral valve is normal in structure. Trivial mitral valve regurgitation. No evidence of mitral stenosis.  4. The aortic valve is tricuspid. Aortic valve regurgitation is not visualized. No aortic stenosis is present.  5. The inferior vena cava is dilated in size with <50% respiratory variability, suggesting right atrial pressure of 15 mmHg. FINDINGS  Left Ventricle: Left ventricular ejection fraction, by estimation, is 55%. The left ventricle has normal function. The left ventricle has no regional wall motion abnormalities. The left ventricular internal cavity size was normal in size. There is moderate asymmetric left ventricular hypertrophy of the septal segment. Left ventricular diastolic parameters are consistent with Grade I diastolic dysfunction (impaired relaxation). Right Ventricle: The right ventricular size is normal. No increase in right ventricular wall thickness. Right ventricular systolic function is normal. There is normal pulmonary artery systolic pressure. The tricuspid regurgitant velocity is 1.63 m/s, and  with an assumed right atrial pressure of 15 mmHg, the estimated right ventricular  systolic pressure is 25.6 mmHg. Left Atrium: Left atrial size was normal in size. Right Atrium: Right atrial size was normal in size. Pericardium: Trivial pericardial effusion is present. Mitral Valve: The mitral valve is normal in structure. Trivial mitral valve regurgitation. No evidence of mitral valve stenosis. Tricuspid Valve: The tricuspid valve is normal in structure. Tricuspid valve regurgitation is mild . No evidence of tricuspid stenosis. Aortic Valve: The aortic valve is tricuspid. Aortic valve regurgitation is not visualized. No aortic stenosis is present. Pulmonic Valve: The pulmonic valve was normal in structure. Pulmonic valve regurgitation is mild. No evidence of pulmonic stenosis. Aorta: The aortic root and ascending aorta are structurally normal, with no evidence of dilitation. Ascending aorta measurements are within normal limits for age when indexed to body surface area. Venous: The inferior vena cava is dilated in size with less than 50% respiratory variability, suggesting right atrial pressure of 15 mmHg. IAS/Shunts: No atrial level shunt detected by color flow Doppler.  LEFT VENTRICLE PLAX 2D LVIDd:         3.80 cm   Diastology LVIDs:         2.70 cm   LV e' medial:    6.74 cm/s LV PW:         1.10 cm   LV E/e' medial:  8.1 LV IVS:        1.40 cm   LV e' lateral:   6.53 cm/s LVOT diam:     2.30 cm   LV E/e' lateral: 8.4 LV SV:         90 LV SV Index:   42 LVOT Area:     4.15 cm  RIGHT VENTRICLE RV S prime:     11.50 cm/s TAPSE (M-mode): 1.7 cm LEFT ATRIUM             Index        RIGHT ATRIUM           Index LA diam:        2.80 cm 1.32 cm/m   RA Area:     15.30 cm LA Vol (A2C):   48.8 ml 22.99 ml/m  RA Volume:   39.10 ml  18.42 ml/m LA Vol (A4C):   35.9 ml 16.91 ml/m LA Biplane Vol: 42.8 ml 20.16 ml/m  AORTIC VALVE LVOT Vmax:   97.40 cm/s LVOT Vmean:  75.300 cm/s LVOT VTI:  0.217 m  AORTA Ao Root diam: 3.70 cm Ao Asc diam:  3.80 cm MITRAL VALVE               TRICUSPID VALVE MV Area  (PHT): 4.12 cm    TR Peak grad:   10.6 mmHg MV Decel Time: 184 msec    TR Vmax:        163.00 cm/s MV E velocity: 54.80 cm/s MV A velocity: 62.20 cm/s  SHUNTS MV E/A ratio:  0.88        Systemic VTI:  0.22 m                            Systemic Diam: 2.30 cm Weston Brass MD Electronically signed by Weston Brass MD Signature Date/Time: 10/25/2022/10:57:13 AM    Final     Cardiac Studies   See echo above  Assessment   Principal Problem:   Aortic dissection distal to left subclavian [facility]) Active Problems:   Hepatocellular carcinoma (HCC)   Plan   Stanford Type B aortic dissection - extends from the distal aortic arch after the left subclavian to the diaphragmatic hiatus - no abdominal involvement. The false lumen was reported at 1.3 cm. Based on these findings, medical management would be recommended, especially in light of his progressive HCC. He would not likely be an operative candidate. Agree with more intensive blood pressure control - currently blood pressure appears optimized. Would transition from IV to oral beta-blockers and restart home meds per PCCM when able to keep HR in the 50-60's and BP 120/80 or less. Cholesterol was noted to be very high, TC 316, TG 77, HDL 38 and LDL 263. Was previously on pravastatin 20 mg daily. Start atorvastatin 80 mg at bedtime. Will check LP(a) as well. Overall prognosis, however, given expanding HCC is poor.  Time Spent Directly with Patient:  I have spent a total of 45 minutes with the patient reviewing hospital notes, telemetry, EKGs, labs and examining the patient as well as establishing an assessment and plan that was discussed personally with the patient.  > 50% of time was spent in direct patient care.  Length of Stay:  LOS: 1 day   Chrystie Nose, MD, [facility], FACP  [place]  [facility]  Medical Director of the [facility] Lipid Disorders &  Cardiovascular Risk Reduction Clinic Diplomate of the American Board of Clinical  Lipidology Attending Cardiologist  Direct Dial: [phone]  Fax: [phone]  Website:  [email or web address]  Lisette Abu Flemon Kelty 10/26/2022, 8:22 AM

## 2022-10-26 NOTE — Progress Notes (Signed)
Mathew [name]   DOB:17-Jul-1950   NF#:[id number]    ASSESSMENT & PLAN:  Hepatocellular carcinoma Recent imaging study from admission show evidence of metastatic disease to the lungs This would not change her outpatient management Continue supportive care  Dissection of descending thoracic aorta He is undergoing evaluation by primary service Continue aggressive blood pressure management I will defer to vascular surgery for management  Poor appetite and recent weight loss The patient requested transfer her prescription to another pharmacy I will help him with that but I would not recommend starting him on Marinol right now Recommend frequent small meals  Elevated hemoglobin Could be due to secondary erythrocytosis from emphysema Monitor closely  Discharge planning Will defer to primary service Please call if questions arise  All questions were answered. The patient knows to call the clinic with any problems, questions or concerns.   The total time spent in the appointment was 40 minutes encounter with patients including review of chart and various tests results, discussions about plan of care and coordination of care plan  Artis Delay, MD 10/26/2022 2:57 PM  Subjective:  I am covering for Dr. Mosetta Putt This patient had received recent chemotherapy for diagnosis of hepatocellular carcinoma He presented yesterday with severe chest pain and was noted to have aortic dissection He is currently receiving aggressive blood pressure management and further evaluation by vascular team Incidentally, his CT angiogram revealed evidence of bilateral pulmonary metastasis These findings discussed with the patient and his wife today He tolerated recent treatment well  Objective:  Vitals:   10/26/22 1400 10/26/22 1415  BP: 120/76 127/84  Pulse: 70 68  Resp: 19 19  Temp:    SpO2: 91% 95%     Intake/Output Summary (Last 24 hours) at 10/26/2022 1457 Last data filed at 10/26/2022 1100 Gross per 24  hour  Intake 2715.39 ml  Output 450 ml  Net 2265.39 ml    GENERAL:alert, no distress and comfortable  NEURO: alert & oriented x 3 with fluent speech, no focal motor/sensory deficits   Labs:  Recent Labs    10/02/22 1420 10/23/22 1417 10/25/22 0945  NA 136 133* 133*  K 3.4* 3.0* 3.7  CL 103 97* 100  CO2 25 24 23   GLUCOSE 76 137* 97  BUN 16 20 16   CREATININE 0.95 1.25* 1.27*  CALCIUM 9.5 9.8 9.1  GFRNONAA >60 >60 >60  PROT 7.9 8.1 8.0  ALBUMIN 3.2* 3.2* 2.7*  AST 64* 91* 95*  ALT 18 17 23   ALKPHOS 167* 143* 129*  BILITOT 0.8 1.1 1.0    Studies: I have personally reviewed his imaging studies CT Angio Chest/Abd/Pel for Dissection W and/or Wo Contrast  Result Date: 10/25/2022 CLINICAL DATA:  Worsening severe substernal chest pain radiating from right shoulder to back, history of hepatocellular carcinoma EXAM: CT ANGIOGRAPHY CHEST, ABDOMEN AND PELVIS TECHNIQUE: Non-contrast CT of the chest was initially obtained. Multidetector CT imaging through the chest, abdomen and pelvis was performed using the standard protocol during bolus administration of intravenous contrast. Multiplanar reconstructed images and MIPs were obtained and reviewed to evaluate the vascular anatomy. RADIATION DOSE REDUCTION: This exam was performed according to the departmental dose-optimization program which includes automated exposure control, adjustment of the mA and/or kV according to patient size and/or use of iterative reconstruction technique. CONTRAST:  75mL OMNIPAQUE IOHEXOL 350 MG/ML SOLN COMPARISON:  09/10/2022, 09/20/2022, 08/19/2022 FINDINGS: CTA CHEST FINDINGS Cardiovascular: There is either intramural hematoma or thrombosed dissection of the descending thoracic aorta, extending from the distal arch  just beyond the left subclavian artery origin to the level of the diaphragmatic hiatus. Maximal thickness of the intramural hematoma or thrombosed dissection measures up to 1.3 cm at the level of the  proximal descending thoracic aorta reference image 43/6. This has developed since the prior CT scans performed 08/19/2022 and 09/10/2022. There is no periaortic fat stranding to suggest leak or rupture. No significant stenosis within the true lumen of the aorta. Minimal calcified atheromatous plaque within the distal aortic arch and descending thoracic aorta. No evidence of thoracic aortic aneurysm. The heart is unremarkable without pericardial effusion. Timing of the contrast bolus is suboptimal for evaluation of the pulmonary vasculature. Mediastinum/Nodes: No enlarged mediastinal, hilar, or axillary lymph nodes. Thyroid gland, trachea, and esophagus demonstrate no significant findings. Lungs/Pleura: No acute airspace disease, effusion, or pneumothorax. Minimal bibasilar hypoventilatory changes. Mild emphysema. There are multiple small pulmonary nodules that have developed in the interim, consistent with metastatic disease. Index nodules are as follows: Left lower lobe, image 74/11, 7 mm. Left upper lobe, image 30/11, 4 mm. Right upper lobe, image 61/11, 6 mm. Central airways are patent. Musculoskeletal: No acute or destructive bony abnormalities. Reconstructed images demonstrate no additional findings. Review of the MIP images confirms the above findings. CTA ABDOMEN AND PELVIS FINDINGS VASCULAR Aorta: Normal caliber aorta without aneurysm, dissection, vasculitis or significant stenosis. Celiac: Patent without evidence of aneurysm, dissection, vasculitis or significant stenosis. SMA: Patent without evidence of aneurysm, dissection, vasculitis or significant stenosis. Renals: Both renal arteries are patent without evidence of aneurysm, dissection, vasculitis, fibromuscular dysplasia or significant stenosis. IMA: Patent without evidence of aneurysm, dissection, vasculitis or significant stenosis. Inflow: Patent without evidence of aneurysm, dissection, vasculitis or significant stenosis. Veins: No obvious venous  abnormality within the limitations of this arterial phase study. Review of the MIP images confirms the above findings. NON-VASCULAR Hepatobiliary: Large necrotic mass within the right lobe liver measuring up to 14.8 x 13.5 cm compatible with the patient's known hepatocellular carcinoma. Exact comparison to prior imaging is difficult due to differences in phase of contrast enhancement, though subjectively the mass appears larger than the previous abdominal CT. Gallbladder is unremarkable. No biliary duct dilation. Pancreas: Unremarkable. No pancreatic ductal dilatation or surrounding inflammatory changes. Spleen: Small residual splenules in the left upper quadrant are unchanged. Adrenals/Urinary Tract: The adrenals are stable. 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 No urinary tract calculi or obstructive uropathy within either kidney. Bladder is unremarkable. Stomach/Bowel: No bowel obstruction or ileus. No bowel wall thickening or inflammatory change. Lymphatic: No pathologic adenopathy within the abdomen or pelvis. Reproductive: Prostate is unremarkable. Other: No free fluid or free intraperitoneal gas. Small fat containing umbilical hernia. No bowel herniation. Musculoskeletal: No acute or destructive bony abnormalities. Reconstructed images demonstrate no additional findings. Review of the MIP images confirms the above findings. IMPRESSION: Vascular: 1. 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 Maximal thickness of the thrombosed false lumen of the dissection measures 1.3 cm. No evidence of true luminal narrowing or aneurysm. 2. Unremarkable abdominal aorta. 3.  Aortic Atherosclerosis (ICD10-I70.0). Nonvascular: 1. Enlarging  necrotic right lobe liver mass consistent with progressive hepatocellular carcinoma. 2. Interval development of numerous bilateral pulmonary nodules consistent with pulmonary metastatic disease. 3.  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 4.  Emphysema (ICD10-J43.9). Critical Value/emergent results were called by telephone at the time of interpretation on 10/25/2022 at 4:47 pm to provider [facility] RAY , who verbally acknowledged these results. Electronically Signed   By: Sharlet Salina M.D.   On: 10/25/2022 16:58   DG Chest Portable 1 View  Result Date: 10/25/2022 CLINICAL DATA:  Chest pain EXAM: PORTABLE CHEST 1 VIEW COMPARISON:  Chest radiograph 09/02/2022 FINDINGS: The cardiomediastinal silhouette is normal. A loop recorder is again noted. There is asymmetric elevation of the right hemidiaphragm. There is no acute osseous abnormality. Coarsened interstitial markings primarily in the lower lobes correspond to changes of chronic interstitial lung disease as seen on recent CT chest. There is no superimposed acute consolidation. There is no pulmonary edema. There is no pleural effusion or pneumothorax. IMPRESSION: No radiographic evidence of acute cardiopulmonary process. Electronically Signed   By: Lesia Hausen M.D.   On: 10/25/2022 11:48   ECHOCARDIOGRAM COMPLETE  Result Date: 10/25/2022    ECHOCARDIOGRAM REPORT   Patient Name:   Mathew [name] Date of Exam: 10/25/2022 Medical Rec #:  [id number]    Height:       75.0 in Accession #:    [id number]   Weight:       185.0 lb Date of Birth:  1950-10-25   BSA:          2.123 m Patient Age:    71 years     BP:           148/99 mmHg Patient Gender: M            HR:           54 bpm. Exam Location:  Inpatient Procedure: 2D Echo, Color Doppler and Cardiac Doppler STAT ECHO Indications:    R07.9* Chest pain, unspecified  History:        Patient has prior history of Echocardiogram examinations, most                 recent 03/26/2018. Risk  Factors:Hypertension and Sleep Apnea.  Sonographer:    Irving Burton Senior RDCS Referring Phys: [phone] MICHAEL COOPER IMPRESSIONS  1. Left ventricular ejection fraction, by estimation, is 55%. The left ventricle has normal function. The left ventricle has no regional wall motion abnormalities. There is moderate asymmetric left ventricular hypertrophy of the septal segment. Left ventricular diastolic parameters are consistent with Grade I diastolic dysfunction (impaired relaxation).  2. Right ventricular systolic function is normal. The right ventricular size is normal. There is normal pulmonary artery systolic pressure.  3. The mitral valve is normal in structure. Trivial mitral valve regurgitation. No evidence of mitral stenosis.  4. The aortic valve is tricuspid. Aortic valve regurgitation is not visualized. No aortic stenosis is present.  5. The inferior vena cava is dilated in size with <50% respiratory variability, suggesting right atrial pressure of 15 mmHg. FINDINGS  Left Ventricle: Left ventricular ejection fraction, by estimation, is 55%. The left ventricle has normal function. The left ventricle has no regional wall motion abnormalities. The left ventricular internal cavity size was normal in size. There is moderate asymmetric left ventricular hypertrophy of the septal segment. Left ventricular diastolic parameters are consistent with Grade I diastolic dysfunction (impaired relaxation). Right Ventricle: The right ventricular size is normal. No increase in right ventricular wall thickness. Right ventricular systolic function is normal. There is normal pulmonary artery systolic pressure. The tricuspid regurgitant velocity is 1.63 m/s, and  with an assumed right atrial pressure of 15 mmHg, the estimated right ventricular systolic pressure is 25.6 mmHg. Left Atrium: Left atrial size was normal in size. Right Atrium: Right atrial size was normal in size. Pericardium: Trivial pericardial effusion is present. Mitral  Valve: The mitral valve is normal in structure. Trivial mitral valve regurgitation. No evidence of mitral valve stenosis. Tricuspid Valve: The tricuspid valve is normal in structure. Tricuspid valve regurgitation is mild . No evidence of tricuspid stenosis. Aortic Valve: The aortic valve is tricuspid. Aortic valve regurgitation is not visualized. No aortic stenosis is present. Pulmonic Valve: The pulmonic valve was normal in structure. Pulmonic valve regurgitation is mild. No evidence of pulmonic stenosis. Aorta: The aortic root and ascending aorta are structurally normal, with no evidence of dilitation. Ascending aorta measurements are within normal limits for age when indexed to body surface area. Venous: The inferior vena cava is dilated in size with less than 50% respiratory variability, suggesting right atrial pressure of 15 mmHg. IAS/Shunts: No atrial level shunt detected by color flow Doppler.  LEFT VENTRICLE PLAX 2D LVIDd:         3.80 cm   Diastology LVIDs:         2.70 cm   LV e' medial:    6.74 cm/s LV PW:         1.10 cm   LV E/e' medial:  8.1 LV IVS:        1.40 cm   LV e' lateral:   6.53 cm/s LVOT diam:     2.30 cm   LV E/e' lateral: 8.4 LV SV:         90 LV SV Index:   42 LVOT Area:     4.15 cm  RIGHT VENTRICLE RV S prime:     11.50 cm/s TAPSE (M-mode): 1.7 cm LEFT ATRIUM             Index        RIGHT ATRIUM           Index LA diam:        2.80 cm 1.32 cm/m   RA Area:     15.30 cm LA Vol (A2C):   48.8 ml 22.99 ml/m  RA Volume:   39.10 ml  18.42 ml/m LA Vol (A4C):   35.9 ml 16.91 ml/m LA Biplane Vol: 42.8 ml 20.16 ml/m  AORTIC VALVE LVOT Vmax:   97.40 cm/s LVOT Vmean:  75.300 cm/s LVOT VTI:    0.217 m  AORTA Ao Root diam: 3.70 cm Ao Asc diam:  3.80 cm MITRAL VALVE               TRICUSPID VALVE MV Area (PHT): 4.12 cm    TR Peak grad:   10.6 mmHg MV Decel Time: 184 msec    TR Vmax:        163.00 cm/s MV E velocity: 54.80 cm/s MV A velocity: 62.20 cm/s  SHUNTS MV E/A ratio:  0.88        Systemic  VTI:  0.22 m                            Systemic Diam: 2.30 cm Weston Brass MD Electronically signed by Weston Brass MD Signature Date/Time: 10/25/2022/10:57:13 AM    Final

## 2022-10-26 NOTE — Progress Notes (Signed)
eLink Physician-Brief Progress Note Patient Name: Missael Ferrari DOB: [birth year] MRN: [id number]   Date of Service  10/26/2022  HPI/Events of Note  Notified of request for sleep aide.   eICU Interventions  Melatonin ordered at bedtime PRN.      Intervention Category Minor Interventions: Routine modifications to care plan (e.g. PRN medications for pain, fever)  Larinda Buttery 10/26/2022, 11:35 PM  1:49 AM Notified of patient complaint of nausea.   Qtc 460  Plan> Compazine ordered IV PRN.

## 2022-10-27 DIAGNOSIS — N179 Acute kidney failure, unspecified: Secondary | ICD-10-CM | POA: Diagnosis not present

## 2022-10-27 DIAGNOSIS — I71019 Dissection of thoracic aorta, unspecified: Secondary | ICD-10-CM | POA: Diagnosis not present

## 2022-10-27 DIAGNOSIS — C220 Liver cell carcinoma: Secondary | ICD-10-CM | POA: Diagnosis not present

## 2022-10-27 DIAGNOSIS — I719 Aortic aneurysm of unspecified site, without rupture: Secondary | ICD-10-CM | POA: Diagnosis not present

## 2022-10-27 DIAGNOSIS — C799 Secondary malignant neoplasm of unspecified site: Secondary | ICD-10-CM | POA: Diagnosis not present

## 2022-10-27 DIAGNOSIS — I71012 Dissection of descending thoracic aorta: Secondary | ICD-10-CM | POA: Diagnosis not present

## 2022-10-27 LAB — COMPREHENSIVE METABOLIC PANEL
ALT: 19 U/L (ref 0–44)
AST: 99 U/L — ABNORMAL HIGH (ref 15–41)
Albumin: 2.5 g/dL — ABNORMAL LOW (ref 3.5–5.0)
Alkaline Phosphatase: 109 U/L (ref 38–126)
Anion gap: 12 (ref 5–15)
BUN: 16 mg/dL (ref 8–23)
CO2: 19 mmol/L — ABNORMAL LOW (ref 22–32)
Calcium: 8.4 mg/dL — ABNORMAL LOW (ref 8.9–10.3)
Chloride: 97 mmol/L — ABNORMAL LOW (ref 98–111)
Creatinine, Ser: 1.06 mg/dL (ref 0.61–1.24)
GFR, Estimated: >60 mL/min (ref >60)
Glucose, Bld: 105 mg/dL — ABNORMAL HIGH (ref 70–99)
Potassium: 4 mmol/L (ref 3.5–5.1)
Sodium: 128 mmol/L — ABNORMAL LOW (ref 135–145)
Total Bilirubin: 1.5 mg/dL — ABNORMAL HIGH (ref 0.3–1.2)
Total Protein: 7.1 g/dL (ref 6.5–8.1)

## 2022-10-27 LAB — MAGNESIUM: Magnesium: 1.3 mg/dL — ABNORMAL LOW (ref 1.7–2.4)

## 2022-10-27 LAB — PHOSPHORUS: Phosphorus: 3.7 mg/dL (ref 2.5–4.6)

## 2022-10-27 MED ORDER — CARVEDILOL 25 MG PO TABS
25.0000 mg | ORAL_TABLET | Freq: Two times a day (BID) | ORAL | Status: DC
  Administered 2022-10-27 – 2022-10-29 (×5): 25 mg via ORAL
  Filled 2022-10-27 (×5): qty 1

## 2022-10-27 MED ORDER — SPIRONOLACTONE 25 MG PO TABS
25.0000 mg | ORAL_TABLET | Freq: Every day | ORAL | Status: DC
  Administered 2022-10-27 – 2022-10-28 (×2): 25 mg via ORAL
  Filled 2022-10-27 (×2): qty 1

## 2022-10-27 MED ORDER — ENOXAPARIN SODIUM 40 MG/0.4ML IJ SOSY
40.0000 mg | PREFILLED_SYRINGE | INTRAMUSCULAR | Status: DC
  Administered 2022-10-27 – 2022-10-29 (×3): 40 mg via SUBCUTANEOUS
  Filled 2022-10-27 (×3): qty 0.4

## 2022-10-27 MED ORDER — MELATONIN 5 MG PO TABS: 5.0000 mg | ORAL_TABLET | Freq: Every evening | ORAL | Status: DC | PRN

## 2022-10-27 MED ORDER — ONDANSETRON HCL 4 MG/2ML IJ SOLN: 4.0000 mg | Freq: Three times a day (TID) | INTRAMUSCULAR | Status: DC | PRN

## 2022-10-27 MED ORDER — PROCHLORPERAZINE EDISYLATE 10 MG/2ML IJ SOLN
10.0000 mg | Freq: Four times a day (QID) | INTRAMUSCULAR | Status: DC | PRN
  Administered 2022-10-27 (×2): 10 mg via INTRAVENOUS
  Filled 2022-10-27 (×3): qty 2

## 2022-10-27 MED ORDER — ISOSORBIDE MONONITRATE ER 30 MG PO TB24
60.0000 mg | ORAL_TABLET | Freq: Every day | ORAL | Status: DC
  Administered 2022-10-27 – 2022-10-29 (×3): 60 mg via ORAL
  Filled 2022-10-27 (×3): qty 2

## 2022-10-27 MED ORDER — ALUM & MAG HYDROXIDE-SIMETH 200-200-20 MG/5ML PO SUSP: 15.0000 mL | Freq: Four times a day (QID) | ORAL | Status: DC | PRN

## 2022-10-27 MED ORDER — MAGNESIUM SULFATE 4 GM/100ML IV SOLN
4.0000 g | Freq: Once | INTRAVENOUS | Status: AC
  Administered 2022-10-27: 4 g via INTRAVENOUS
  Filled 2022-10-27: qty 100

## 2022-10-27 NOTE — Progress Notes (Signed)
DAILY PROGRESS NOTE   Patient Name: Mathew [name] Date of Encounter: 10/27/2022 Cardiologist: None  Chief Complaint   No complaints  Patient Profile   Mathew [name] is a 72 y.o. male with a hx of PAF no longer on OAC, remote prostate cancer s/p prostatectomy, treated Hep C, OSA not on CPAP, HTN, idiopathic pulmonary fibrosis,  who is being seen 10/25/2022 for the evaluation of chest pain and abnormal EKG at the request of Dr. Rosalia Hammers.   Subjective   BP remains well-controlled. Seen by vascular surgery -  they plan a follow-up CT angio aorta on Monday. Transitioning over to oral antihypertensives. No further chest/abdominal or back pain.  Objective   Vitals:   10/27/22 0600 10/27/22 0615 10/27/22 0630 10/27/22 0730  BP: 98/69 111/83 114/83   Pulse: 70 69 71   Resp: (!) 23 16 16    Temp:    97.8 F (36.6 C)  TempSrc:    Oral  SpO2: 93% 92% 93%   Weight:      Height:        Intake/Output Summary (Last 24 hours) at 10/27/2022 3244 Last data filed at 10/27/2022 0730 Gross per 24 hour  Intake 2088.83 ml  Output 525 ml  Net 1563.83 ml   Filed Weights   10/25/22 0917 10/26/22 0500 10/27/22 0433  Weight: 83.9 kg 85.6 kg 85.2 kg    Physical Exam   General appearance: alert and no distress Neck: no carotid bruit, no JVD, and thyroid not enlarged, symmetric, no tenderness/mass/nodules Lungs: clear to auscultation bilaterally Heart: regular rate and rhythm, S1, S2 normal, no murmur, click, rub or gallop Abdomen: soft, non-tender; bowel sounds normal; no masses,  no organomegaly Extremities: extremities normal, atraumatic, no cyanosis or edema Pulses: 2+ and symmetric Skin: Skin color, texture, turgor normal. No rashes or lesions Neurologic: Grossly normal Psych: Pleasant  Inpatient Medications    Scheduled Meds:  amLODipine  10 mg Oral Daily   atorvastatin  80 mg Oral QHS   Chlorhexidine Gluconate Cloth  6 each Topical Daily   escitalopram  10 mg Oral Daily   feeding  supplement  1 Container Oral TID BM   losartan  100 mg Oral Daily   metoprolol succinate  100 mg Oral Daily    Continuous Infusions:  sodium chloride Stopped (10/25/22 2000)   clevidipine 13 mg/hr (10/27/22 0600)   esmolol 125 mcg/kg/min (10/27/22 0745)    PRN Meds: docusate sodium, melatonin, morphine injection, mouth rinse, polyethylene glycol, prochlorperazine   Labs   Results for orders placed or performed during the hospital encounter of 10/25/22 (from the past 48 hour(s))  Comprehensive metabolic panel     Status: Abnormal   Collection Time: 10/25/22  9:45 AM  Result Value Ref Range   Sodium 133 (L) 135 - 145 mmol/L   Potassium 3.7 3.5 - 5.1 mmol/L   Chloride 100 98 - 111 mmol/L   CO2 23 22 - 32 mmol/L   Glucose, Bld 97 70 - 99 mg/dL    Comment: Glucose reference range applies only to samples taken after fasting for at least 8 hours.   BUN 16 8 - 23 mg/dL   Creatinine, Ser 0.10 (H) 0.61 - 1.24 mg/dL   Calcium 9.1 8.9 - 27.2 mg/dL   Total Protein 8.0 6.5 - 8.1 g/dL   Albumin 2.7 (L) 3.5 - 5.0 g/dL   AST 95 (H) 15 - 41 U/L   ALT 23 0 - 44 U/L   Alkaline Phosphatase 129 (  H) 38 - 126 U/L   Total Bilirubin 1.0 0.3 - 1.2 mg/dL   GFR, Estimated >07 >37 mL/min    Comment: (NOTE) Calculated using the CKD-EPI Creatinine Equation (2021)    Anion gap 10 5 - 15    Comment: Performed at [facility], [address], [place], [place] [address]  CBC     Status: Abnormal   Collection Time: 10/25/22  9:58 AM  Result Value Ref Range   WBC 6.8 4.0 - 10.5 K/uL   RBC 6.48 (H) 4.22 - 5.81 MIL/uL   Hemoglobin 17.7 (H) 13.0 - 17.0 g/dL   HCT 94.8 (H) 54.6 - 27.0 %   MCV 83.5 80.0 - 100.0 fL   MCH 27.3 26.0 - 34.0 pg   MCHC 32.7 30.0 - 36.0 g/dL   RDW 35.0 (H) 09.3 - 81.8 %   Platelets 206 150 - 400 K/uL    Comment: REPEATED TO VERIFY   nRBC 0.0 0.0 - 0.2 %    Comment: Performed at [facility], [address], [place], [place] [address]  Hemoglobin A1c     Status:  Abnormal   Collection Time: 10/25/22  9:58 AM  Result Value Ref Range   Hgb A1c MFr Bld 5.7 (H) 4.8 - 5.6 %    Comment: (NOTE) Pre diabetes:          5.7%-6.4%  Diabetes:              >6.4%  Glycemic control for   <7.0% adults with diabetes    Mean Plasma Glucose 116.89 mg/dL    Comment: Performed at [facility], [address], [place], [place] [address]  Lipid panel     Status: Abnormal   Collection Time: 10/25/22 11:00 AM  Result Value Ref Range   Cholesterol 316 (H) 0 - 200 mg/dL   Triglycerides 77 <893 mg/dL   HDL 38 (L) >81 mg/dL   Total CHOL/HDL Ratio 8.3 RATIO   VLDL 15 0 - 40 mg/dL   LDL Cholesterol 017 (H) 0 - 99 mg/dL    Comment:        Total Cholesterol/HDL:CHD Risk Coronary Heart Disease Risk Table                     Men   Women  1/2 Average Risk   3.4   3.3  Average Risk       5.0   4.4  2 X Average Risk   9.6   7.1  3 X Average Risk  23.4   11.0        Use the calculated Patient Ratio above and the CHD Risk Table to determine the patient's CHD Risk.        ATP III CLASSIFICATION (LDL):  <100     mg/dL   Optimal  510-258  mg/dL   Near or Above                    Optimal  130-159  mg/dL   Borderline  527-782  mg/dL   High  >423     mg/dL   Very High Performed at [facility], [address] [place], [place] [address]   Protime-INR     Status: Abnormal   Collection Time: 10/25/22 11:00 AM  Result Value Ref Range   Prothrombin Time 16.0 (H) 11.4 - 15.2 seconds   INR 1.3 (H) 0.8 - 1.2  Comment: (NOTE) INR goal varies based on device and disease states. Performed at [facility], [address] [place], [place] [address]   APTT     Status: Abnormal   Collection Time: 10/25/22 11:00 AM  Result Value Ref Range   aPTT 40 (H) 24 - 36 seconds    Comment:        IF BASELINE aPTT IS ELEVATED, SUGGEST PATIENT RISK ASSESSMENT BE USED TO DETERMINE APPROPRIATE ANTICOAGULANT THERAPY. Performed at [facility], [address], [place], [place] [address]   Troponin I (High Sensitivity)     Status: None   Collection Time: 10/25/22 11:00 AM  Result Value Ref Range   Troponin I (High Sensitivity) 15 <18 ng/L    Comment: (NOTE) Elevated high sensitivity troponin I (hsTnI) values and significant  changes across serial measurements may suggest ACS but many other  chronic and acute conditions are known to elevate hsTnI results.  Refer to the "Links" section for chest pain algorithms and additional  guidance. Performed at [facility], [address], [place], [place] [address]   Troponin I (High Sensitivity)     Status: None   Collection Time: 10/25/22  1:32 PM  Result Value Ref Range   Troponin I (High Sensitivity) 14 <18 ng/L    Comment: (NOTE) Elevated high sensitivity troponin I (hsTnI) values and significant  changes across serial measurements may suggest ACS but many other  chronic and acute conditions are known to elevate hsTnI results.  Refer to the "Links" section for chest pain algorithms and additional  guidance. Performed at [facility], [address], [place], [place] [address]   Lipase, blood     Status: None   Collection Time: 10/25/22  1:32 PM  Result Value Ref Range   Lipase 25 11 - 51 U/L    Comment: Performed at [facility], [address], [place], [place] [address]  MRSA Next Gen by PCR, Nasal     Status: None   Collection Time: 10/25/22  8:16 PM   Specimen: Nasal Mucosa; Nasal Swab  Result Value Ref Range   MRSA by PCR Next Gen NOT DETECTED NOT DETECTED    Comment: (NOTE) The GeneXpert MRSA Assay (FDA approved for NASAL specimens only), is one component of a comprehensive MRSA colonization surveillance program. It is not intended to diagnose MRSA infection nor to guide or monitor treatment for MRSA infections. Test performance is not FDA approved in patients less than 34 years old. Performed at [facility], [address] [place], [place] [address]    CBC     Status: Abnormal   Collection Time: 10/26/22  4:17 AM  Result Value Ref Range   WBC 9.1 4.0 - 10.5 K/uL   RBC 6.84 (H) 4.22 - 5.81 MIL/uL   Hemoglobin 18.5 (H) 13.0 - 17.0 g/dL   HCT 39.7 (H) 67.3 - 41.9 %   MCV 81.6 80.0 - 100.0 fL   MCH 27.0 26.0 - 34.0 pg   MCHC 33.2 30.0 - 36.0 g/dL   RDW 37.9 (H) 02.4 - 09.7 %   Platelets 232 150 - 400 K/uL   nRBC 0.2 0.0 - 0.2 %    Comment: Performed at [facility], [address] [place], [place] [address]  HIV Antibody (routine testing w rflx)     Status: None   Collection Time: 10/26/22 10:57 AM  Result Value Ref Range   HIV  Screen 4th Generation wRfx Non Reactive Non Reactive    Comment: Performed at [facility], [address], [place], [place] [address]    ECG   Normal sinus rhythm at 65, no ST elevation - personally reviewed  Telemetry   Sinus rhythm, short 5 beat NSVT run - Personally Reviewed  Radiology    CT Angio Chest/Abd/Pel for Dissection W and/or Wo Contrast  Result Date: 10/25/2022 CLINICAL DATA:  Worsening severe substernal chest pain radiating from right shoulder to back, history of hepatocellular carcinoma EXAM: CT ANGIOGRAPHY CHEST, ABDOMEN AND PELVIS TECHNIQUE: Non-contrast CT of the chest was initially obtained. Multidetector CT imaging through the chest, abdomen and pelvis was performed using the standard protocol during bolus administration of intravenous contrast. Multiplanar reconstructed images and MIPs were obtained and reviewed to evaluate the vascular anatomy. RADIATION DOSE REDUCTION: This exam was performed according to the departmental dose-optimization program which includes automated exposure control, adjustment of the mA and/or kV according to patient size and/or use of iterative reconstruction technique. CONTRAST:  75mL OMNIPAQUE IOHEXOL 350 MG/ML SOLN COMPARISON:  09/10/2022, 09/20/2022, 08/19/2022 FINDINGS: CTA CHEST FINDINGS Cardiovascular: There is either intramural hematoma or  thrombosed dissection of the descending thoracic aorta, extending from the distal arch just beyond the left subclavian artery origin to the level of the diaphragmatic hiatus. Maximal thickness of the intramural hematoma or thrombosed dissection measures up to 1.3 cm at the level of the proximal descending thoracic aorta reference image 43/6. This has developed since the prior CT scans performed 08/19/2022 and 09/10/2022. There is no periaortic fat stranding to suggest leak or rupture. No significant stenosis within the true lumen of the aorta. Minimal calcified atheromatous plaque within the distal aortic arch and descending thoracic aorta. No evidence of thoracic aortic aneurysm. The heart is unremarkable without pericardial effusion. Timing of the contrast bolus is suboptimal for evaluation of the pulmonary vasculature. Mediastinum/Nodes: No enlarged mediastinal, hilar, or axillary lymph nodes. Thyroid gland, trachea, and esophagus demonstrate no significant findings. Lungs/Pleura: No acute airspace disease, effusion, or pneumothorax. Minimal bibasilar hypoventilatory changes. Mild emphysema. There are multiple small pulmonary nodules that have developed in the interim, consistent with metastatic disease. Index nodules are as follows: Left lower lobe, image 74/11, 7 mm. Left upper lobe, image 30/11, 4 mm. Right upper lobe, image 61/11, 6 mm. Central airways are patent. Musculoskeletal: No acute or destructive bony abnormalities. Reconstructed images demonstrate no additional findings. Review of the MIP images confirms the above findings. CTA ABDOMEN AND PELVIS FINDINGS VASCULAR Aorta: Normal caliber aorta without aneurysm, dissection, vasculitis or significant stenosis. Celiac: Patent without evidence of aneurysm, dissection, vasculitis or significant stenosis. SMA: Patent without evidence of aneurysm, dissection, vasculitis or significant stenosis. Renals: Both renal arteries are patent without evidence of  aneurysm, dissection, vasculitis, fibromuscular dysplasia or significant stenosis. IMA: Patent without evidence of aneurysm, dissection, vasculitis or significant stenosis. Inflow: Patent without evidence of aneurysm, dissection, vasculitis or significant stenosis. Veins: No obvious venous abnormality within the limitations of this arterial phase study. Review of the MIP images confirms the above findings. NON-VASCULAR Hepatobiliary: Large necrotic mass within the right lobe liver measuring up to 14.8 x 13.5 cm compatible with the patient's known hepatocellular carcinoma. Exact comparison to prior imaging is difficult due to differences in phase of contrast enhancement, though subjectively the mass appears larger than the previous abdominal CT. Gallbladder is unremarkable. No biliary duct dilation. Pancreas: Unremarkable. No pancreatic ductal dilatation or surrounding inflammatory changes. Spleen: Small residual splenules in the  left upper quadrant are unchanged. Adrenals/Urinary Tract: The adrenals are stable. 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 No urinary tract calculi or obstructive uropathy within either kidney. Bladder is unremarkable. Stomach/Bowel: No bowel obstruction or ileus. No bowel wall thickening or inflammatory change. Lymphatic: No pathologic adenopathy within the abdomen or pelvis. Reproductive: Prostate is unremarkable. Other: No free fluid or free intraperitoneal gas. Small fat containing umbilical hernia. No bowel herniation. Musculoskeletal: No acute or destructive bony abnormalities. Reconstructed images demonstrate no additional findings. Review of the MIP images confirms the above findings. IMPRESSION: Vascular: 1. 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 Maximal thickness of the thrombosed false lumen of the dissection measures 1.3 cm. No evidence of true luminal narrowing or aneurysm. 2. Unremarkable abdominal aorta. 3.  Aortic Atherosclerosis (ICD10-I70.0). Nonvascular: 1. Enlarging necrotic right lobe liver mass consistent with progressive hepatocellular carcinoma. 2. Interval development of numerous bilateral pulmonary nodules consistent with pulmonary metastatic disease. 3. 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 4.  Emphysema (ICD10-J43.9). Critical Value/emergent results were called by telephone at the time of interpretation on 10/25/2022 at 4:47 pm to provider [facility] RAY , who verbally acknowledged these results. Electronically Signed   By: Sharlet Salina M.D.   On: 10/25/2022 16:58   DG Chest Portable 1 View  Result Date: 10/25/2022 CLINICAL DATA:  Chest pain EXAM: PORTABLE CHEST 1 VIEW COMPARISON:  Chest radiograph 09/02/2022 FINDINGS: The cardiomediastinal silhouette is normal. A loop recorder is again noted. There is asymmetric elevation of the right hemidiaphragm. There is no acute osseous abnormality. Coarsened interstitial markings primarily in the lower lobes correspond to changes of chronic interstitial lung disease as seen on recent CT chest. There is no superimposed acute consolidation. There is no pulmonary edema. There is no pleural effusion or pneumothorax. IMPRESSION: No radiographic evidence of acute cardiopulmonary process. Electronically Signed   By: Lesia Hausen M.D.   On: 10/25/2022 11:48   ECHOCARDIOGRAM COMPLETE  Result Date: 10/25/2022    ECHOCARDIOGRAM REPORT   Patient Name:   Mathew [name] Date of Exam: 10/25/2022 Medical Rec #:  [id number]    Height:       75.0 in Accession #:    [id number]   Weight:       185.0 lb Date of Birth:  03/08/1951   BSA:          2.123 m Patient Age:    71 years     BP:           148/99 mmHg Patient Gender: M             HR:           54 bpm. Exam Location:  Inpatient Procedure: 2D Echo, Color Doppler and Cardiac Doppler STAT ECHO Indications:    R07.9* Chest pain, unspecified  History:        Patient has prior history of Echocardiogram examinations, most                 recent 03/26/2018. Risk Factors:Hypertension and Sleep Apnea.  Sonographer:    Irving Burton Senior RDCS Referring Phys: [phone] MICHAEL COOPER IMPRESSIONS  1. Left ventricular ejection fraction, by estimation, is 55%. The left  ventricle has normal function. The left ventricle has no regional wall motion abnormalities. There is moderate asymmetric left ventricular hypertrophy of the septal segment. Left ventricular diastolic parameters are consistent with Grade I diastolic dysfunction (impaired relaxation).  2. Right ventricular systolic function is normal. The right ventricular size is normal. There is normal pulmonary artery systolic pressure.  3. The mitral valve is normal in structure. Trivial mitral valve regurgitation. No evidence of mitral stenosis.  4. The aortic valve is tricuspid. Aortic valve regurgitation is not visualized. No aortic stenosis is present.  5. The inferior vena cava is dilated in size with <50% respiratory variability, suggesting right atrial pressure of 15 mmHg. FINDINGS  Left Ventricle: Left ventricular ejection fraction, by estimation, is 55%. The left ventricle has normal function. The left ventricle has no regional wall motion abnormalities. The left ventricular internal cavity size was normal in size. There is moderate asymmetric left ventricular hypertrophy of the septal segment. Left ventricular diastolic parameters are consistent with Grade I diastolic dysfunction (impaired relaxation). Right Ventricle: The right ventricular size is normal. No increase in right ventricular wall thickness. Right ventricular systolic function is normal. There is normal pulmonary artery systolic pressure. The tricuspid regurgitant velocity is 1.63 m/s, and   with an assumed right atrial pressure of 15 mmHg, the estimated right ventricular systolic pressure is 25.6 mmHg. Left Atrium: Left atrial size was normal in size. Right Atrium: Right atrial size was normal in size. Pericardium: Trivial pericardial effusion is present. Mitral Valve: The mitral valve is normal in structure. Trivial mitral valve regurgitation. No evidence of mitral valve stenosis. Tricuspid Valve: The tricuspid valve is normal in structure. Tricuspid valve regurgitation is mild . No evidence of tricuspid stenosis. Aortic Valve: The aortic valve is tricuspid. Aortic valve regurgitation is not visualized. No aortic stenosis is present. Pulmonic Valve: The pulmonic valve was normal in structure. Pulmonic valve regurgitation is mild. No evidence of pulmonic stenosis. Aorta: The aortic root and ascending aorta are structurally normal, with no evidence of dilitation. Ascending aorta measurements are within normal limits for age when indexed to body surface area. Venous: The inferior vena cava is dilated in size with less than 50% respiratory variability, suggesting right atrial pressure of 15 mmHg. IAS/Shunts: No atrial level shunt detected by color flow Doppler.  LEFT VENTRICLE PLAX 2D LVIDd:         3.80 cm   Diastology LVIDs:         2.70 cm   LV e' medial:    6.74 cm/s LV PW:         1.10 cm   LV E/e' medial:  8.1 LV IVS:        1.40 cm   LV e' lateral:   6.53 cm/s LVOT diam:     2.30 cm   LV E/e' lateral: 8.4 LV SV:         90 LV SV Index:   42 LVOT Area:     4.15 cm  RIGHT VENTRICLE RV S prime:     11.50 cm/s TAPSE (M-mode): 1.7 cm LEFT ATRIUM             Index        RIGHT ATRIUM           Index LA diam:        2.80 cm 1.32 cm/m   RA Area:     15.30 cm LA Vol (A2C):   48.8 ml 22.99 ml/m  RA Volume:  39.10 ml  18.42 ml/m LA Vol (A4C):   35.9 ml 16.91 ml/m LA Biplane Vol: 42.8 ml 20.16 ml/m  AORTIC VALVE LVOT Vmax:   97.40 cm/s LVOT Vmean:  75.300 cm/s LVOT VTI:    0.217 m  AORTA Ao Root diam:  3.70 cm Ao Asc diam:  3.80 cm MITRAL VALVE               TRICUSPID VALVE MV Area (PHT): 4.12 cm    TR Peak grad:   10.6 mmHg MV Decel Time: 184 msec    TR Vmax:        163.00 cm/s MV E velocity: 54.80 cm/s MV A velocity: 62.20 cm/s  SHUNTS MV E/A ratio:  0.88        Systemic VTI:  0.22 m                            Systemic Diam: 2.30 cm Weston Brass MD Electronically signed by Weston Brass MD Signature Date/Time: 10/25/2022/10:57:13 AM    Final     Cardiac Studies   See echo above  Assessment   Principal Problem:   Aortic dissection distal to left subclavian [facility]) Active Problems:   Hepatocellular carcinoma (HCC)   Metastatic malignant neoplasm (HCC)   Plan   Stanford Type B aortic dissection - pain has improved. Plan for repeat imaging per vascular surgery on Monday. Cholesterol was noted to be very high, TC 316, TG 77, HDL 38 and LDL 263. Now on atorvastatin 80 mg at bedtime. LP(a) pending. Remains on cleviprex and esmolol - agree with plan to wean off these today with amlodipine, losartan and Toprol XL to replace them.   Time Spent Directly with Patient:  I have spent a total of 35 minutes with the patient reviewing hospital notes, telemetry, EKGs, labs and examining the patient as well as establishing an assessment and plan that was discussed personally with the patient.  > 50% of time was spent in direct patient care.  Length of Stay:  LOS: 2 days   Chrystie Nose, MD, [facility], FACP  [place]  [facility]  Medical Director of the [facility] Lipid Disorders &  Cardiovascular Risk Reduction Clinic Diplomate of the American Board of Clinical Lipidology Attending Cardiologist  Direct Dial: [phone]  Fax: [phone]  Website:  [email or web address] 10/27/2022, 7:52 AM

## 2022-10-27 NOTE — Progress Notes (Signed)
  VASCULAR SURGERY ASSESSMENT & PLAN:   INTRAMURAL HEMATOMA THORACIC AORTA: He remains pain-free.  His blood pressure is under good control.  I will order a follow-up CT angio on Monday.  SUBJECTIVE:   Resting comfortably.  No chest pain or back pain overnight  PHYSICAL EXAM:   Vitals:   10/27/22 0545 10/27/22 0600 10/27/22 0615 10/27/22 0630  BP: 114/82 98/69 111/83 114/83  Pulse: 70 70 69 71  Resp: 16 (!) 23 16 16   Temp:      TempSrc:      SpO2: 92% 93% 92% 93%  Weight:      Height:       Lungs clear  LABS:   Lab Results  Component Value Date   WBC 9.1 10/26/2022   HGB 18.5 (H) 10/26/2022   HCT 55.8 (H) 10/26/2022   MCV 81.6 10/26/2022   PLT 232 10/26/2022   Lab Results  Component Value Date   CREATININE 1.27 (H) 10/25/2022   Lab Results  Component Value Date   INR 1.3 (H) 10/25/2022   PROBLEM LIST:    Principal Problem:   Aortic dissection distal to left subclavian [facility]) Active Problems:   Hepatocellular carcinoma (HCC)   Metastatic malignant neoplasm (HCC)  CURRENT MEDS:    amLODipine  10 mg Oral Daily   atorvastatin  80 mg Oral QHS   Chlorhexidine Gluconate Cloth  6 each Topical Daily   escitalopram  10 mg Oral Daily   feeding supplement  1 Container Oral TID BM   losartan  100 mg Oral Daily   metoprolol succinate  100 mg Oral Daily   Waverly Ferrari Office: [phone] 10/27/2022

## 2022-10-27 NOTE — Progress Notes (Signed)
NAME:  Mathew [name], MRN:  [id number], DOB:  05/13/1950, LOS: 2 ADMISSION DATE:  10/25/2022, CONSULTATION DATE:  10/25/2022 REFERRING MD:  Dr. Rosalia Hammers - EDP, CHIEF COMPLAINT:  Type B aortic dissection    History of Present Illness:  Mathew [name] is a 72 y.o. with a past medical history significant for recently diagnosed hepatocellular carcinoma, paroxysmal atrial fibrillation ,HTN, OSA, prostate cancer 2006, and previously treated hepatitis C who presented to the ED via EMS with complaints of sudden onset chest pain that began in his right shoulder and radiated into his chest.  Also associated with some diaphoresis.  He received nitroglycerin and aspirin prehospital.  On ED arrival patient was seen mildly bradycardic and hypertensive.  EKG concerning for possible ST elevation therefore cardiology was consulted and did not see evidence consistent with acute STEMI.  Decision made to administer IV heparin bolus and obtain echo to look for WMA.  Patient underwent CT angio chest abdomen and pelvis to rule out dissection and revealed a thrombosed type B thoracic aortic dissection extending from distal aortic arch just beyond the left subclavian artery to the level of the diaphragm.  Given need for tight hemodynamic control PCCM consulted for further management and admission  Pertinent  Medical History  hepatocellular carcinoma, paroxysmal atrial fibrillation ,HTN, OSA, prostate cancer 2006, and previously treated hepatitis C   Significant Hospital Events: Including procedures, antibiotic start and stop dates in addition to other pertinent events   6/21 presented for sudden onset chest pain workup revealed thrombosed type B thoracic aortic dissection  Interim History / Subjective:  Patient stated chest and abdominal pain has resolved Stated could not sleep at night Complaining of poor appetite Still on clevidipine and esmolol infusions  Objective   Blood pressure 114/83, pulse 71, temperature 97.8 F  (36.6 C), temperature source Oral, resp. rate 16, height 6\' 3"  (1.905 m), weight 85.2 kg, SpO2 93 %.        Intake/Output Summary (Last 24 hours) at 10/27/2022 0845 Last data filed at 10/27/2022 0730 Gross per 24 hour  Intake 1827.01 ml  Output 525 ml  Net 1302.01 ml   Filed Weights   10/25/22 0917 10/26/22 0500 10/27/22 0433  Weight: 83.9 kg 85.6 kg 85.2 kg    Examination: General: Elderly male, lying on the bed HEENT: [place]/AT, eyes anicteric.  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   Vascular surgery is following, recommend repeating CT angiogram of chest and abdomen with dissection protocol by tomorrow Continue to require Cleviprex and esmolol infusion with SBP goal <120 and heart rate <80 Continue IV morphine for pain control On oral antihypertensives including amlodipine, losartan Switch Toprol to Coreg for better blood pressure and heart rate control Started on Imdur  Will try to taper off antihypertensive infusions  Hyponatremia Hypomagnesemia Serum sodium remains low at 128 Continue aggressive electrolyte replacement  Acute kidney injury Baseline creatinine fluctuates between 0.7-1.1, creatinine on admission 1.27 Serum creatinine has trended down Monitor intake and  output Avoid nephrotoxic agent  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 and prostate  cancer Liver biopsy 08/30/2022 confirmed hepatocellular carcinoma, patient began chemotherapy 3 weeks prior to admission CTA with newly developed numerous pulmonary nodules likely representing metastatic disease Seen by oncology recommended outpatient follow-up  Best Practice (right click and "Reselect all SmartList Selections" daily)   Diet/type: Advance diet DVT prophylaxis: SQ Lovenox GI prophylaxis: N/A Lines: N/A Foley:  N/A Code Status:  full code Last date of multidisciplinary goals of care discussion: 6/22: Patient was updated at bedside, decision was to continue full scope of care  Labs   CBC: Recent Labs  Lab 10/23/22 1417 10/25/22 0958 10/26/22 0417  WBC 7.7 6.8 9.1  NEUTROABS 3.5  --   --   HGB 18.4* 17.7* 18.5*  HCT 55.2* 54.1* 55.8*  MCV 81.1 83.5 81.6  PLT 238 206 232    Basic Metabolic Panel: Recent Labs  Lab 10/23/22 1417 10/25/22 0945  NA 133* 133*  K 3.0* 3.7  CL 97* 100  CO2 24 23  GLUCOSE 137* 97  BUN 20 16  CREATININE 1.25* 1.27*  CALCIUM 9.8 9.1   GFR: Estimated Creatinine Clearance: 64.7 mL/min (A) (by C-G formula based on SCr of 1.27 mg/dL (H)). Recent Labs  Lab 10/23/22 1417 10/25/22 0958 10/26/22 0417  WBC 7.7 6.8 9.1    Liver Function Tests: Recent Labs  Lab 10/23/22 1417 10/25/22 0945  AST 91* 95*  ALT 17 23  ALKPHOS 143* 129*  BILITOT 1.1 1.0  PROT 8.1 8.0  ALBUMIN 3.2* 2.7*   Recent Labs  Lab 10/25/22 1332  LIPASE 25   No results for input(s): "AMMONIA" in the last 168 hours.  ABG    Component Value Date/Time   TCO2 24 11/29/2011 0002     Coagulation Profile: Recent Labs  Lab 10/25/22 1100  INR 1.3*    Cardiac Enzymes: No results for input(s): "CKTOTAL", "CKMB", "CKMBINDEX", "TROPONINI" in the last 168 hours.  HbA1C: Hgb A1c MFr Bld  Date/Time Value Ref Range Status  10/25/2022 09:58 AM 5.7 (H) 4.8 - 5.6 % Final    Comment:    (NOTE) Pre diabetes:          5.7%-6.4%  Diabetes:               >6.4%  Glycemic control for   <7.0% adults with diabetes     CBG: No results for input(s): "GLUCAP" in the last 168 hours.   The patient is critically ill due to type B aortic dissection, uncontrolled hypertension, AKI.  Critical care was necessary to treat or prevent imminent or life-threatening deterioration.  Critical care was time spent personally by me on the following activities: development of treatment plan with patient and/or surrogate as well as nursing, discussions with consultants, evaluation of patient's response to treatment, examination of patient, obtaining history from patient or surrogate, ordering and performing treatments and interventions, ordering and review of laboratory studies, ordering and review of radiographic studies, pulse oximetry, re-evaluation of patient's condition and participation in multidisciplinary rounds.   During this encounter critical care time was devoted to patient care services described in this note for 33 minutes.     Cheri Fowler, MD [facility] See Amion for pager If no response to pager, please call [phone] until 7pm After 7pm, Please call E-link [phone]

## 2022-10-28 ENCOUNTER — Inpatient Hospital Stay (HOSPITAL_COMMUNITY): Payer: BC Managed Care – PPO

## 2022-10-28 DIAGNOSIS — C799 Secondary malignant neoplasm of unspecified site: Secondary | ICD-10-CM | POA: Diagnosis not present

## 2022-10-28 DIAGNOSIS — N179 Acute kidney failure, unspecified: Secondary | ICD-10-CM | POA: Diagnosis not present

## 2022-10-28 DIAGNOSIS — I10 Essential (primary) hypertension: Secondary | ICD-10-CM | POA: Diagnosis not present

## 2022-10-28 DIAGNOSIS — I719 Aortic aneurysm of unspecified site, without rupture: Secondary | ICD-10-CM | POA: Diagnosis not present

## 2022-10-28 DIAGNOSIS — I71012 Dissection of descending thoracic aorta: Secondary | ICD-10-CM | POA: Diagnosis not present

## 2022-10-28 LAB — MAGNESIUM: Magnesium: 2.3 mg/dL (ref 1.7–2.4)

## 2022-10-28 MED ORDER — MAGNESIUM SULFATE 2 GM/50ML IV SOLN
2.0000 g | Freq: Once | INTRAVENOUS | Status: AC
  Administered 2022-10-28: 2 g via INTRAVENOUS
  Filled 2022-10-28: qty 50

## 2022-10-28 MED ORDER — MAGNESIUM SULFATE 4 GM/100ML IV SOLN
4.0000 g | Freq: Once | INTRAVENOUS | Status: AC
  Administered 2022-10-28: 4 g via INTRAVENOUS
  Filled 2022-10-28: qty 100

## 2022-10-28 MED ORDER — IOHEXOL 350 MG/ML SOLN
100.0000 mL | Freq: Once | INTRAVENOUS | Status: AC | PRN
  Administered 2022-10-28: 100 mL via INTRAVENOUS

## 2022-10-28 NOTE — Progress Notes (Signed)
NAME:  Mathew [name], MRN:  [id number], DOB:  1950/09/08, LOS: 3 ADMISSION DATE:  10/25/2022, CONSULTATION DATE:  10/25/2022 REFERRING MD:  Dr. Rosalia Hammers - EDP, CHIEF COMPLAINT:  Type B aortic dissection    History of Present Illness:  Mathew [name] is a 72 y.o. with a past medical history significant for recently diagnosed hepatocellular carcinoma, paroxysmal atrial fibrillation ,HTN, OSA, prostate cancer 2006, and previously treated hepatitis C who presented to the ED via EMS with complaints of sudden onset chest pain that began in his right shoulder and radiated into his chest.  Also associated with some diaphoresis.  He received nitroglycerin and aspirin prehospital.  On ED arrival patient was seen mildly bradycardic and hypertensive.  EKG concerning for possible ST elevation therefore cardiology was consulted and did not see evidence consistent with acute STEMI.  Decision made to administer IV heparin bolus and obtain echo to look for WMA.  Patient underwent CT angio chest abdomen and pelvis to rule out dissection and revealed a thrombosed type B thoracic aortic dissection extending from distal aortic arch just beyond the left subclavian artery to the level of the diaphragm.  Given need for tight hemodynamic control PCCM consulted for further management and admission  Pertinent  Medical History  hepatocellular carcinoma, paroxysmal atrial fibrillation ,HTN, OSA, prostate cancer 2006, and previously treated hepatitis C   Significant Hospital Events: Including procedures, antibiotic start and stop dates in addition to other pertinent events   6/21 presented for sudden onset chest pain workup revealed thrombosed type B thoracic aortic dissection  Interim History / Subjective:  No overnight issues Clevidipine infusion was titrated off, still remain on esmolol infusion Pending repeat CTA chest abdomen pelvis today  Objective   Blood pressure 127/87, pulse 74, temperature 98.1 F (36.7 C), temperature  source Oral, resp. rate 20, height 6\' 3"  (1.905 m), weight 85 kg, SpO2 96 %.        Intake/Output Summary (Last 24 hours) at 10/28/2022 2536 Last data filed at 10/28/2022 0800 Gross per 24 hour  Intake 868.83 ml  Output 1075 ml  Net -206.17 ml   Filed Weights   10/26/22 0500 10/27/22 0433 10/28/22 0500  Weight: 85.6 kg 85.2 kg 85 kg    Examination: Physical exam: General: Elderly African-American male, lying on the bed HEENT: [place]/AT, eyes anicteric.  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   Vascular surgery is following, recommend repeating CT angiogram of chest and abdomen with dissection protocol today Was titrated off, still remains on esmolol infusion If CTA remained stable, will liberalize blood pressure to SBP of 150 Continue IV morphine for pain control On oral antihypertensives including amlodipine, losartan, Coreg, Imdur and spironolactone  Hyponatremia Hypomagnesemia Serum sodium remains low at 128 Received magnesium supplement  Acute kidney injury Baseline creatinine fluctuates between 0.7-1.1, creatinine on admission 1.27 Serum creatinine has trended down Monitor intake and output Avoid nephrotoxic agent  Metastatic hepatocellular  carcinoma Prior bilateral renal cell carcinoma and prostate cancer Liver biopsy 08/30/2022  confirmed hepatocellular carcinoma, patient began chemotherapy 3 weeks prior to admission CTA with newly developed numerous pulmonary nodules likely representing metastatic disease On immunotherapy Seen by oncology recommended outpatient follow-up  Best Practice (right click and "Reselect all SmartList Selections" daily)   Diet/type: Advance diet DVT prophylaxis: SQ Lovenox GI prophylaxis: N/A Lines: N/A Foley:  N/A Code Status:  full code Last date of multidisciplinary goals of care discussion: 6/22: Patient was updated at bedside, decision was to continue full scope of care  Labs   CBC: Recent Labs  Lab 10/23/22 1417 10/25/22 0958 10/26/22 0417  WBC 7.7 6.8 9.1  NEUTROABS 3.5  --   --   HGB 18.4* 17.7* 18.5*  HCT 55.2* 54.1* 55.8*  MCV 81.1 83.5 81.6  PLT 238 206 232    Basic Metabolic Panel: Recent Labs  Lab 10/23/22 1417 10/25/22 0945 10/27/22 0842  NA 133* 133* 128*  K 3.0* 3.7 4.0  CL 97* 100 97*  CO2 24 23 19*  GLUCOSE 137* 97 105*  BUN 20 16 16   CREATININE 1.25* 1.27* 1.06  CALCIUM 9.8 9.1 8.4*  MG  --   --  1.3*  PHOS  --   --  3.7   GFR: Estimated Creatinine Clearance: 77.5 mL/min (by C-G formula based on SCr of 1.06 mg/dL). Recent Labs  Lab 10/23/22 1417 10/25/22 0958 10/26/22 0417  WBC 7.7 6.8 9.1    Liver Function Tests: Recent Labs  Lab 10/23/22 1417 10/25/22 0945 10/27/22 0842  AST 91* 95* 99*  ALT 17 23 19   ALKPHOS 143* 129* 109  BILITOT 1.1 1.0 1.5*  PROT 8.1 8.0 7.1  ALBUMIN 3.2* 2.7* 2.5*   Recent Labs  Lab 10/25/22 1332  LIPASE 25   No results for input(s): "AMMONIA" in the last 168 hours.  ABG    Component Value Date/Time   TCO2 24 11/29/2011 0002     Coagulation Profile: Recent Labs  Lab 10/25/22 1100  INR 1.3*    Cardiac Enzymes: No results for input(s): "CKTOTAL", "CKMB", "CKMBINDEX", "TROPONINI" in the last 168 hours.  HbA1C: Hgb A1c MFr Bld  Date/Time Value Ref Range Status  10/25/2022 09:58 AM  5.7 (H) 4.8 - 5.6 % Final    Comment:    (NOTE) Pre diabetes:          5.7%-6.4%  Diabetes:              >6.4%  Glycemic control for   <7.0% adults with diabetes     CBG: No results for input(s): "GLUCAP" in the last 168 hours.   The patient is critically ill due to type B aortic dissection, poorly hypertension, AKI.  Critical care was necessary to treat or prevent imminent or life-threatening deterioration.  Critical care was time spent personally by me on the following activities: development of treatment plan with patient and/or surrogate as well as nursing, discussions with consultants, evaluation of patient's response to treatment, examination of patient, obtaining history from patient or surrogate, ordering and performing treatments and interventions, ordering and review of laboratory studies, ordering and review of radiographic studies, pulse oximetry, re-evaluation of patient's condition and participation in multidisciplinary rounds.   During this encounter critical care time was devoted to patient care services described in this note for 31 minutes.    Cheri Fowler, MD [facility] See Amion for pager If no response to pager, please call [phone] until 7pm  After 7pm, Please call E-link [phone]

## 2022-10-28 NOTE — Progress Notes (Signed)
[facility] ADULT ICU REPLACEMENT PROTOCOL   The patient does apply for the [facility] Adult ICU Electrolyte Replacment Protocol based on the criteria listed below:   1.Exclusion criteria: TCTS, ECMO, Dialysis, and Myasthenia Gravis patients 2. Is GFR >/= 30 ml/min? Yes.    Patient's GFR today is >60 3. Is SCr </= 2? Yes.   Patient's SCr is 1.06 mg/dL 4. Did SCr increase >/= 0.5 in 24 hours? No. 5.Pt's weight >40kg  Yes.   6. Abnormal electrolyte(s): Mag 1.3  7. Electrolytes replaced per protocol 8.  Call MD STAT for K+ </= 2.5, Phos </= 1, or Mag </= 1 Physician:  Dr. Vladimir Faster  Lolita Lenz 10/28/2022 4:28 AM

## 2022-10-28 NOTE — Progress Notes (Signed)
Rounding Note    Patient Name: Mathew [name] Date of Encounter: 10/28/2022  [facility] Cardiologist: None   Subjective   Patient looks good this morning.  No chest pain or shortness of breath.  No abdominal pain.  Family at bedside.  Remains on an esmolol drip with blood pressure goal less than 120/80.  Inpatient Medications    Scheduled Meds:  amLODipine  10 mg Oral Daily   atorvastatin  80 mg Oral QHS   carvedilol  25 mg Oral BID WC   Chlorhexidine Gluconate Cloth  6 each Topical Daily   enoxaparin (LOVENOX) injection  40 mg Subcutaneous Q24H   escitalopram  10 mg Oral Daily   feeding supplement  1 Container Oral TID BM   isosorbide mononitrate  60 mg Oral Daily   losartan  100 mg Oral Daily   spironolactone  25 mg Oral Daily   Continuous Infusions:  sodium chloride Stopped (10/28/22 0703)   clevidipine Stopped (10/27/22 2308)   esmolol 40 mcg/kg/min (10/28/22 0800)   magnesium sulfate bolus IVPB 50 mL/hr at 10/28/22 0800   PRN Meds: alum & mag hydroxide-simeth, docusate sodium, melatonin, morphine injection, ondansetron (ZOFRAN) IV, mouth rinse, polyethylene glycol, prochlorperazine   Vital Signs    Vitals:   10/28/22 0615 10/28/22 0630 10/28/22 0700 10/28/22 0800  BP: 115/81 113/80 135/86   Pulse: 78 77 75 74  Resp: 16 16 13 20   Temp:      TempSrc:      SpO2: 94% 94% 94% 96%  Weight:      Height:        Intake/Output Summary (Last 24 hours) at 10/28/2022 0802 Last data filed at 10/28/2022 0800 Gross per 24 hour  Intake 926.22 ml  Output 1075 ml  Net -148.78 ml      10/28/2022    5:00 AM 10/27/2022    4:33 AM 10/26/2022    5:00 AM  Last 3 Weights  Weight (lbs) 187 lb 6.3 oz 187 lb 13.3 oz 188 lb 11.4 oz  Weight (kg) 85 kg 85.2 kg 85.6 kg      Telemetry    Personally reviewed.  Sinus rhythm with single PVCs, no significant arrhythmia - Personally Reviewed   Physical Exam  Alert, oriented, in no distress.  Appears comfortable. GEN: No  acute distress.   Neck: No JVD Cardiac: RRR, no murmurs, rubs, or gallops.  Respiratory: Clear to auscultation bilaterally. GI: Soft, nontender, non-distended  MS: No edema; No deformity. Neuro:  Nonfocal  Psych: Normal affect   Labs    High Sensitivity Troponin:   Recent Labs  Lab 10/25/22 1100 10/25/22 1332  TROPONINIHS 15 14     Chemistry Recent Labs  Lab 10/23/22 1417 10/25/22 0945 10/27/22 0842  NA 133* 133* 128*  K 3.0* 3.7 4.0  CL 97* 100 97*  CO2 24 23 19*  GLUCOSE 137* 97 105*  BUN 20 16 16   CREATININE 1.25* 1.27* 1.06  CALCIUM 9.8 9.1 8.4*  MG  --   --  1.3*  PROT 8.1 8.0 7.1  ALBUMIN 3.2* 2.7* 2.5*  AST 91* 95* 99*  ALT 17 23 19   ALKPHOS 143* 129* 109  BILITOT 1.1 1.0 1.5*  GFRNONAA >60 >60 >60  ANIONGAP 12 10 12     Lipids  Recent Labs  Lab 10/25/22 1100  CHOL 316*  TRIG 77  HDL 38*  LDLCALC 263*  CHOLHDL 8.3    Hematology Recent Labs  Lab 10/23/22 1417 10/25/22 1324  10/26/22 0417  WBC 7.7 6.8 9.1  RBC 6.81* 6.48* 6.84*  HGB 18.4* 17.7* 18.5*  HCT 55.2* 54.1* 55.8*  MCV 81.1 83.5 81.6  MCH 27.0 27.3 27.0  MCHC 33.3 32.7 33.2  RDW 19.8* 20.7* 20.2*  PLT 238 206 232   Thyroid  Recent Labs  Lab 10/23/22 1417  TSH 1.680    BNPNo results for input(s): "BNP", "PROBNP" in the last 168 hours.  DDimer No results for input(s): "DDIMER" in the last 168 hours.   Radiology    No results found.  Cardiac Studies   Cardiac Studies & Procedures     STRESS TESTS  EXERCISE TOLERANCE TEST (ETT) 03/02/2018  Narrative  Blood pressure demonstrated a hypertensive response to exercise.  There was no ST segment deviation noted during stress.  No ischemia. Good exercise capacity. Hypertensive response to stress.   ECHOCARDIOGRAM  ECHOCARDIOGRAM COMPLETE 10/25/2022  Narrative ECHOCARDIOGRAM REPORT    Patient Name:   Mathew [name] Date of Exam: 10/25/2022 Medical Rec #:  [id number]    Height:       75.0 in Accession #:     [id number]   Weight:       185.0 lb Date of Birth:  1950/12/02   BSA:          2.123 m Patient Age:    72 years     BP:           148/99 mmHg Patient Gender: M            HR:           54 bpm. Exam Location:  Inpatient  Procedure: 2D Echo, Color Doppler and Cardiac Doppler  STAT ECHO  Indications:    R07.9* Chest pain, unspecified  History:        Patient has prior history of Echocardiogram examinations, most recent 03/26/2018. Risk Factors:Hypertension and Sleep Apnea.  Sonographer:    Irving Burton Senior RDCS Referring Phys: [phone] Other [place]  IMPRESSIONS   1. Left ventricular ejection fraction, by estimation, is 55%. The left ventricle has normal function. The left ventricle has no regional wall motion abnormalities. There is moderate asymmetric left ventricular hypertrophy of the septal segment. Left ventricular diastolic parameters are consistent with Grade I diastolic dysfunction (impaired relaxation). 2. Right ventricular systolic function is normal. The right ventricular size is normal. There is normal pulmonary artery systolic pressure. 3. The mitral valve is normal in structure. Trivial mitral valve regurgitation. No evidence of mitral stenosis. 4. The aortic valve is tricuspid. Aortic valve regurgitation is not visualized. No aortic stenosis is present. 5. The inferior vena cava is dilated in size with <50% respiratory variability, suggesting right atrial pressure of 15 mmHg.  FINDINGS Left Ventricle: Left ventricular ejection fraction, by estimation, is 55%. The left ventricle has normal function. The left ventricle has no regional wall motion abnormalities. The left ventricular internal cavity size was normal in size. There is moderate asymmetric left ventricular hypertrophy of the septal segment. Left ventricular diastolic parameters are consistent with Grade I diastolic dysfunction (impaired relaxation).  Right Ventricle: The right ventricular size is normal. No increase in  right ventricular wall thickness. Right ventricular systolic function is normal. There is normal pulmonary artery systolic pressure. The tricuspid regurgitant velocity is 1.63 m/s, and with an assumed right atrial pressure of 15 mmHg, the estimated right ventricular systolic pressure is 25.6 mmHg.  Left Atrium: Left atrial size was normal in size.  Right Atrium: Right atrial size was normal in  size.  Pericardium: Trivial pericardial effusion is present.  Mitral Valve: The mitral valve is normal in structure. Trivial mitral valve regurgitation. No evidence of mitral valve stenosis.  Tricuspid Valve: The tricuspid valve is normal in structure. Tricuspid valve regurgitation is mild . No evidence of tricuspid stenosis.  Aortic Valve: The aortic valve is tricuspid. Aortic valve regurgitation is not visualized. No aortic stenosis is present.  Pulmonic Valve: The pulmonic valve was normal in structure. Pulmonic valve regurgitation is mild. No evidence of pulmonic stenosis.  Aorta: The aortic root and ascending aorta are structurally normal, with no evidence of dilitation. Ascending aorta measurements are within normal limits for age when indexed to body surface area.  Venous: The inferior vena cava is dilated in size with less than 50% respiratory variability, suggesting right atrial pressure of 15 mmHg.  IAS/Shunts: No atrial level shunt detected by color flow Doppler.   LEFT VENTRICLE PLAX 2D LVIDd:         3.80 cm   Diastology LVIDs:         2.70 cm   LV e' medial:    6.74 cm/s LV PW:         1.10 cm   LV E/e' medial:  8.1 LV IVS:        1.40 cm   LV e' lateral:   6.53 cm/s LVOT diam:     2.30 cm   LV E/e' lateral: 8.4 LV SV:         90 LV SV Index:   42 LVOT Area:     4.15 cm   RIGHT VENTRICLE RV S prime:     11.50 cm/s TAPSE (M-mode): 1.7 cm  LEFT ATRIUM             Index        RIGHT ATRIUM           Index LA diam:        2.80 cm 1.32 cm/m   RA Area:     15.30 cm LA Vol  (A2C):   48.8 ml 22.99 ml/m  RA Volume:   39.10 ml  18.42 ml/m LA Vol (A4C):   35.9 ml 16.91 ml/m LA Biplane Vol: 42.8 ml 20.16 ml/m AORTIC VALVE LVOT Vmax:   97.40 cm/s LVOT Vmean:  75.300 cm/s LVOT VTI:    0.217 m  AORTA Ao Root diam: 3.70 cm Ao Asc diam:  3.80 cm  MITRAL VALVE               TRICUSPID VALVE MV Area (PHT): 4.12 cm    TR Peak grad:   10.6 mmHg MV Decel Time: 184 msec    TR Vmax:        163.00 cm/s MV E velocity: 54.80 cm/s MV A velocity: 62.20 cm/s  SHUNTS MV E/A ratio:  0.88        Systemic VTI:  0.22 m Systemic Diam: 2.30 cm  Weston Brass MD Electronically signed by Weston Brass MD Signature Date/Time: 10/25/2022/10:57:13 AM    Final    MONITORS  CARDIAC EVENT MONITOR 02/26/2016            Patient Profile     72 y.o. male with hepatocellular cancer who presents with chest and abdominal pain, found to have type B aortic dissection  Assessment & Plan    1.  Aortic dissection distal to the left subclavian (type B) 2.  Hepatocellular carcinoma 3.  Hypertension  Notes reviewed.  Vascular surgery with plans  for repeat CTA imaging today.  Patient with adequate blood pressure control but currently on esmolol.  Patient currently on carvedilol 25 mg twice daily, amlodipine 10 mg daily, isosorbide 60 mg daily, losartan 100 mg daily, and spironolactone 25 mg daily.  Will await his 10 AM medications to assess blood pressure response.  Would like to get him off of esmolol today.  If necessary, will add hydralazine 25 mg 3 times daily.  Otherwise continue current therapy.    For questions or updates, please contact [facility] Please consult [email or web address] for contact info under        Signed, Tonny Bollman, MD  10/28/2022, 8:02 AM

## 2022-10-28 NOTE — Progress Notes (Addendum)
  Progress Note  VASCULAR SURGERY ASSESSMENT & PLAN:   INTRAMURAL HEMATOMA THORACIC AORTA: He remains pain-free.  His blood pressure is under good control.  Follow-up CT ordered for today.  Cari Caraway, MD 10:45 AM   10/28/2022 7:53 AM * No surgery found *  Subjective:  denies any back, chest, or abdominal pain    Vitals:   10/28/22 0615 10/28/22 0630  BP: 115/81 113/80  Pulse: 78 77  Resp: 16 16  Temp:    SpO2: 94% 94%    Physical Exam: General:  resting comfortably Cardiac:  regular, HR in 80s. SBP 110s-120s Lungs:  nonlabored Abdomen:  soft, NT, ND  CBC    Component Value Date/Time   WBC 9.1 10/26/2022 0417   RBC 6.84 (H) 10/26/2022 0417   HGB 18.5 (H) 10/26/2022 0417   HGB 18.4 (H) 10/23/2022 1417   HCT 55.8 (H) 10/26/2022 0417   PLT 232 10/26/2022 0417   PLT 238 10/23/2022 1417   MCV 81.6 10/26/2022 0417   MCH 27.0 10/26/2022 0417   MCHC 33.2 10/26/2022 0417   RDW 20.2 (H) 10/26/2022 0417   LYMPHSABS 3.0 10/23/2022 1417   MONOABS 0.7 10/23/2022 1417   EOSABS 0.1 10/23/2022 1417   BASOSABS 0.2 (H) 10/23/2022 1417    BMET    Component Value Date/Time   NA 128 (L) 10/27/2022 0842   K 4.0 10/27/2022 0842   CL 97 (L) 10/27/2022 0842   CO2 19 (L) 10/27/2022 0842   GLUCOSE 105 (H) 10/27/2022 0842   BUN 16 10/27/2022 0842   CREATININE 1.06 10/27/2022 0842   CREATININE 1.25 (H) 10/23/2022 1417   CALCIUM 8.4 (L) 10/27/2022 0842   GFRNONAA >60 10/27/2022 0842   GFRNONAA >60 10/23/2022 1417   GFRAA >60 06/12/2018 0815    INR    Component Value Date/Time   INR 1.3 (H) 10/25/2022 1100     Intake/Output Summary (Last 24 hours) at 10/28/2022 0753 Last data filed at 10/28/2022 0600 Gross per 24 hour  Intake 880.1 ml  Output 875 ml  Net 5.1 ml      Assessment/Plan:  72 y.o. 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 SBP in 110s-120. Cleviprex stopped last night, still requiring  esmolol -Will plan for repeat CTA today to assess for any interval dissection changes   Loel Dubonnet, PA-C [facility] [phone] 10/28/2022 7:53 AM

## 2022-10-29 ENCOUNTER — Other Ambulatory Visit (HOSPITAL_COMMUNITY): Payer: Self-pay

## 2022-10-29 DIAGNOSIS — I719 Aortic aneurysm of unspecified site, without rupture: Secondary | ICD-10-CM | POA: Diagnosis not present

## 2022-10-29 DIAGNOSIS — I71012 Dissection of descending thoracic aorta: Secondary | ICD-10-CM | POA: Diagnosis not present

## 2022-10-29 DIAGNOSIS — N179 Acute kidney failure, unspecified: Secondary | ICD-10-CM | POA: Diagnosis not present

## 2022-10-29 DIAGNOSIS — C220 Liver cell carcinoma: Secondary | ICD-10-CM | POA: Diagnosis not present

## 2022-10-29 LAB — LIPOPROTEIN A (LPA): Lipoprotein (a): 91.8 nmol/L — ABNORMAL HIGH (ref <75.0)

## 2022-10-29 MED ORDER — AMLODIPINE BESYLATE 10 MG PO TABS
10.0000 mg | ORAL_TABLET | Freq: Every day | ORAL | 2 refills | Status: DC
  Filled 2022-10-29: qty 30, 30d supply, fill #0

## 2022-10-29 MED ORDER — LOSARTAN POTASSIUM 100 MG PO TABS: 100.0000 mg | ORAL_TABLET | Freq: Every day | ORAL | 2 refills | Status: DC

## 2022-10-29 MED ORDER — ISOSORBIDE MONONITRATE ER 60 MG PO TB24: 60.0000 mg | ORAL_TABLET | Freq: Every day | ORAL | 2 refills | Status: DC

## 2022-10-29 MED ORDER — ACETAMINOPHEN 325 MG PO TABS
650.0000 mg | ORAL_TABLET | Freq: Four times a day (QID) | ORAL | Status: DC | PRN
  Administered 2022-10-29: 650 mg via ORAL
  Filled 2022-10-29: qty 2

## 2022-10-29 MED ORDER — CARVEDILOL 25 MG PO TABS: 25.0000 mg | ORAL_TABLET | Freq: Two times a day (BID) | ORAL | 2 refills | Status: DC

## 2022-10-29 MED ORDER — AMLODIPINE BESYLATE 10 MG PO TABS: 10.0000 mg | ORAL_TABLET | Freq: Every day | ORAL | 2 refills | Status: DC

## 2022-10-29 MED ORDER — SPIRONOLACTONE 50 MG PO TABS: 50.0000 mg | ORAL_TABLET | Freq: Every day | ORAL | 2 refills | Status: DC

## 2022-10-29 MED ORDER — SPIRONOLACTONE 25 MG PO TABS
50.0000 mg | ORAL_TABLET | Freq: Every day | ORAL | Status: DC
  Administered 2022-10-29: 50 mg via ORAL
  Filled 2022-10-29: qty 2

## 2022-10-29 MED ORDER — LOSARTAN POTASSIUM 100 MG PO TABS
100.0000 mg | ORAL_TABLET | Freq: Every day | ORAL | 2 refills | Status: DC
  Filled 2022-10-29: qty 30, 30d supply, fill #0

## 2022-10-29 MED ORDER — ISOSORBIDE MONONITRATE ER 60 MG PO TB24
60.0000 mg | ORAL_TABLET | Freq: Every day | ORAL | 2 refills | Status: DC
  Filled 2022-10-29: qty 30, 30d supply, fill #0

## 2022-10-29 MED ORDER — ATORVASTATIN CALCIUM 80 MG PO TABS: 80.0000 mg | ORAL_TABLET | Freq: Every day | ORAL | 2 refills | Status: DC

## 2022-10-29 MED ORDER — ATORVASTATIN CALCIUM 80 MG PO TABS
80.0000 mg | ORAL_TABLET | Freq: Every day | ORAL | 2 refills | Status: DC
  Filled 2022-10-29: qty 30, 30d supply, fill #0

## 2022-10-29 MED ORDER — SPIRONOLACTONE 50 MG PO TABS
50.0000 mg | ORAL_TABLET | Freq: Every day | ORAL | 2 refills | Status: DC
  Filled 2022-10-29: qty 30, 30d supply, fill #0

## 2022-10-29 MED ORDER — CARVEDILOL 25 MG PO TABS
25.0000 mg | ORAL_TABLET | Freq: Two times a day (BID) | ORAL | 2 refills | Status: DC
  Filled 2022-10-29: qty 60, 30d supply, fill #0

## 2022-10-29 NOTE — Progress Notes (Signed)
  VASCULAR SURGERY ASSESSMENT & PLAN:   INTRAMURAL HEMATOMA THORACIC AORTA: Follow-up CT scan yesterday shows no change.  His blood pressure is a little high this morning.  I will arrange a follow-up CT as an outpatient in 6 weeks.  SUBJECTIVE:   No specific complaints this morning.  PHYSICAL EXAM:   Vitals:   10/29/22 0430 10/29/22 0500 10/29/22 0530 10/29/22 0600  BP:   (!) 147/96 (!) 150/93  Pulse: 78 79 78 78  Resp: 18 (!) 21 20 (!) 22  Temp:      TempSrc:      SpO2: 96% 96% 98% 95%  Weight:      Height:       Lungs clear.  LABS:   Lab Results  Component Value Date   WBC 9.1 10/26/2022   HGB 18.5 (H) 10/26/2022   HCT 55.8 (H) 10/26/2022   MCV 81.6 10/26/2022   PLT 232 10/26/2022   Lab Results  Component Value Date   CREATININE 1.06 10/27/2022   Lab Results  Component Value Date   INR 1.3 (H) 10/25/2022   PROBLEM LIST:    Principal Problem:   Dissection of descending thoracic aorta (HCC) Active Problems:   Hepatocellular carcinoma (HCC)   Metastatic malignant neoplasm (HCC)   AKI (acute kidney injury) (HCC)   Hypomagnesemia   Malignant hypertension   CURRENT MEDS:    amLODipine  10 mg Oral Daily   atorvastatin  80 mg Oral QHS   carvedilol  25 mg Oral BID WC   Chlorhexidine Gluconate Cloth  6 each Topical Daily   enoxaparin (LOVENOX) injection  40 mg Subcutaneous Q24H   escitalopram  10 mg Oral Daily   feeding supplement  1 Container Oral TID BM   isosorbide mononitrate  60 mg Oral Daily   losartan  100 mg Oral Daily   spironolactone  25 mg Oral Daily    Waverly Ferrari Office: [phone] 10/29/2022

## 2022-10-29 NOTE — Progress Notes (Signed)
Attempted to assist discharging the patient to the discharge lounge to await his TOC meds. According to [facility] the patient's RN she was informed that the patient needed to remain on the unit until his TOC meds were ready despite being discharge lounge appropriate. Educated her on the purpose of the lounge. Completed the medication list on the AVS. Left the discharge for the patient's RN.

## 2022-10-29 NOTE — Discharge Summary (Signed)
Physician Discharge Summary      Patient ID: Mathew [name] MRN: [id number] DOB/AGE: 1950/12/12 72 y.o.  Admission Date: 10/25/2022 Discharge Date: 10/29/2022  Discharge Diagnoses:  Principal Problem:   Dissection of descending thoracic aorta [facility]) Active Problems:   Hepatocellular carcinoma (HCC)   Metastatic malignant neoplasm (HCC)   AKI (acute kidney injury) (HCC)   Hypomagnesemia   Malignant hypertension  History of Present Illness: 72 year old man with PMHx significant for recently diagnosed hepatocellular carcinoma, paroxysmal atrial fibrillation, HTN, OSA, prostate cancer (2006), and previously treated hepatitis C who presented to [facility] ED 6/21 via EMS with complaints of sudden onset chest pain that began in his right shoulder and radiated into his chest and associated diaphoresis.  He received nitroglycerin and aspirin pre-hospital.    Hospital Course:  On ED arrival, patient was mildly bradycardic and hypertensive. EKG concerning for possible ST elevation, therefore Cardiology was consulted. Fortunately, no findings considering acute STEMI.  Decision made to administer IV heparin bolus and obtain echo to look for WMA.  Patient underwent CT angio chest abdomen and pelvis to rule out dissection and revealed a thrombosed type B thoracic aortic dissection extending from distal aortic arch just beyond the left subclavian artery to the level of the diaphragm.  Given need for tight hemodynamic control PCCM consulted for further management and admission. VVS and Cardiology consulted. Initially required esmolol gtt, weaned off with addition of multiple PO medications. Patient's BP was improved with adequate control on new medications and patient was deemed medically stable for discharge 6/25.  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  Repeat CTA Chest/A/P stable. - VVS and Cardiology followed while in-house, appreciate assistance -  Recommend repeat CT in 6 weeks (outpatient) - Maintain tight BP control (multiple medications), goal SBP < 160 - Continue amlodipine, carvedilol, losartan, isosorbide, spironolactone at discharge - Repeat BMP/Mg in 1-2 weeks   AKI likely in the setting of severe HTN, improved Hyponatremia, improving Hypomagnesemia, resolved Baseline creatinine fluctuates between 0.7-1.1, creatinine on admission 1.27 - F/u with PCP for hospital follow up and labs within 2 weeks of discharge, specifically needs BMP + Mg for monitoring of lytes and renal function - Cr 1.06 on discharge   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 CTA with newly developed numerous pulmonary nodules likely representing metastatic disease. - Follow-up with Oncology as outpatient - Immunotherapy as ordered  Significant Hospital Tests/Studies:  Consults: PCCM, VVS, Cardiology, Oncology  Discharge Exam: BP 127/80   Pulse 72   Temp 98.1 F (36.7 C) (Oral)   Resp (!) 21   Ht 6\' 3"  (1.905 m)   Wt 85 kg   SpO2 95%   BMI 23.42 kg/m   General: Overall well-appearing older man in NAD. HEENT: [place]/AT, anicteric sclera, PERRL, moist mucous membranes. Neuro: Awake, oriented x 4. Responds to verbal stimuli. Following commands consistently. Moves all 4 extremities spontaneously. No neurologic deficits. CV: RRR, no m/g/r. PULM: Breathing even and unlabored on RA. Lung fields CTAB. GI: Soft, nontender, nondistended. Normoactive bowel sounds. Extremities: No LE edema noted. Skin: Warm/dry, no rashes.  Labs at Discharge: Lab Results  Component Value Date    CREATININE  1.06 10/27/2022   BUN 16 10/27/2022   NA 128 (L) 10/27/2022   K 4.0 10/27/2022   CL 97 (L) 10/27/2022   CO2 19 (L) 10/27/2022   Lab Results  Component Value Date   WBC 9.1 10/26/2022   HGB 18.5 (H) 10/26/2022   HCT 55.8 (H) 10/26/2022   MCV 81.6 10/26/2022   PLT 232 10/26/2022   Lab Results  Component Value Date   ALT 19 10/27/2022   AST 99 (H) 10/27/2022   ALKPHOS 109 10/27/2022   BILITOT 1.5 (H) 10/27/2022   Lab Results  Component Value Date   INR 1.3 (H) 10/25/2022   INR 1.0 09/02/2022   INR 1.0 08/30/2022   Current Radiology Studies: CT Angio Chest/Abd/Pel for Dissection W and/or W/WO  Result Date: 10/28/2022 CLINICAL DATA:  Follow-up dissection EXAM: CT ANGIOGRAPHY CHEST, ABDOMEN AND PELVIS TECHNIQUE: Non-contrast CT of the chest was initially obtained. Multidetector CT imaging through the chest, abdomen and pelvis was performed using the standard protocol during bolus administration of intravenous contrast. Multiplanar reconstructed images and MIPs were obtained and reviewed to evaluate the vascular anatomy. RADIATION DOSE REDUCTION: This exam was performed according to the departmental dose-optimization program which includes automated exposure control, adjustment of the mA and/or kV according to patient size and/or use of iterative reconstruction technique. CONTRAST:  OMNIPAQUE IOHEXOL 350 MG/ML SOLN COMPARISON:  Chest CT dated Sep 10, 2022 FINDINGS: CTA CHEST FINDINGS Cardiovascular: Normal heart size. No pericardial effusion. No coronary artery calcifications. No suspicious filling defects of the central pulmonary arteries. Mediastinum/Nodes: Esophagus and thyroid are unremarkable. No enlarged lymph nodes seen in the chest. Lungs/Pleura: Central airways are patent. Moderate centrilobular emphysema. Bibasilar atelectasis. Stable mild lower lung predominant fibrosis. Redemonstrated numerous bilateral solid pulmonary nodules. No pleural effusion or  pneumothorax. Musculoskeletal: No chest wall abnormality. No acute or significant osseous findings. Review of the MIP images confirms the above findings. CTA ABDOMEN AND PELVIS FINDINGS VASCULAR Aorta: Stable type B thrombosed dissection versus intramural hematoma extending from the origin of the left subclavian artery to just above the celiac artery. Mild atherosclerotic disease. Celiac: Patent without evidence of aneurysm, dissection, vasculitis or significant stenosis. SMA: Patent without evidence of aneurysm, dissection, vasculitis or significant stenosis. Renals: Both renal arteries are patent without evidence of aneurysm, dissection, vasculitis, fibromuscular dysplasia or significant stenosis. IMA: Patent without evidence of aneurysm, dissection, vasculitis or significant stenosis. Inflow: Patent without evidence of aneurysm, dissection, vasculitis or significant stenosis. Veins: No obvious venous abnormality within the limitations of this arterial phase study. Review of the MIP images confirms the above findings. NON-VASCULAR Hepatobiliary: Large mass of the right lobe of the liver, unchanged. Normal appearance of the gallbladder. No biliary ductal dilation. Pancreas: Unremarkable. No pancreatic ductal dilatation or surrounding inflammatory changes. Spleen: Left upper quadrant splenules. Adrenals/Urinary Tract: Bilateral adrenal glands are unremarkable. No hydronephrosis nephrolithiasis. 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 Bladder is unremarkable. Stomach/Bowel: Stomach is within normal limits. Appendix appears normal. No evidence of bowel wall thickening, distention, or inflammatory changes. Lymphatic: No enlarged lymph nodes seen in the abdomen or pelvis Reproductive: Unremarkable. Other: No abdominal wall hernia or abnormality. No abdominopelvic ascites. Musculoskeletal: No acute or significant osseous  findings. Review of the MIP images confirms the above findings. IMPRESSION: 1. Stable type B thrombosed dissection/intramural hematoma extending from the origin of the left subclavian artery to just above the celiac artery. 2. Redemonstrated numerous  bilateral solid pulmonary nodules, concerning for metastatic disease. 3. Large mass of the right lobe of the liver, consistent with history of hepatocellular carcinoma. 4. 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 5. Aortic Atherosclerosis (ICD10-I70.0) and Emphysema (ICD10-J43.9). Electronically Signed   By: Allegra Lai M.D.   On: 10/28/2022 11:15    Disposition:  Discharge disposition: 01-Home or Self Care      Discharge Instructions     Call MD for:   Complete by: As directed    SBP > 160, DBP > 90, chest pain, back pain, dizziness/lightheadedness, severe headache   Call MD for:  difficulty breathing, headache or visual disturbances   Complete by: As directed    Call MD for:  persistant dizziness or light-headedness   Complete by: As directed    Call MD for:  persistant nausea and vomiting   Complete by: As directed    Call MD for:  severe uncontrolled pain   Complete by: As directed    Call MD for:  temperature >100.4   Complete by: As directed    Diet - low sodium heart healthy   Complete by: As directed    Discharge instructions   Complete by: As directed    You were seen in the hospital for an aortic dissection, which is a complication of high blood pressure. Please review your medication list carefully, as your medications and/or medication dosages have changed. Check your blood pressure 2-3 times/day (morning/night, or morning/lunch time/night) and keep a log of these numbers. Notify your primary care doctor or cardiologist if your systolic blood pressure (SBP, top number) is greater than 160 or if your diastolic  blood pressure (DBP, bottom number) is greater than 90. Please follow up with your primary care provider for labs (specifically CBC, BMP) and hospital follow-up within 2 weeks. Vascular surgery will arrange for repeat CT scan and follow up in 6 weeks.   Increase activity slowly   Complete by: As directed       Allergies as of 10/29/2022   No Known Allergies      Medication List     STOP taking these medications    Apixaban Starter Pack (10mg  and 5mg ) Commonly known as: ELIQUIS STARTER PACK   dronabinol 5 MG capsule Commonly known as: MARINOL   metoCLOPramide 10 MG tablet Commonly known as: REGLAN   metoprolol succinate 100 MG 24 hr tablet Commonly known as: TOPROL-XL   ondansetron 4 MG disintegrating tablet Commonly known as: ZOFRAN-ODT   pantoprazole 40 MG tablet Commonly known as: Protonix   pravastatin 20 MG tablet Commonly known as: PRAVACHOL   prochlorperazine 10 MG tablet Commonly known as: COMPAZINE   promethazine 25 MG tablet Commonly known as: PHENERGAN       TAKE these medications    amLODipine 10 MG tablet Commonly known as: NORVASC Take 1 tablet (10 mg total) by mouth daily. Start taking on: October 30, 2022 What changed:  medication strength how much to take additional instructions   atorvastatin 80 MG tablet Commonly known as: LIPITOR Take 1 tablet (80 mg total) by mouth at bedtime.   carvedilol 25 MG tablet Commonly known as: COREG Take 1 tablet (25 mg total) by mouth 2 (two) times daily with a meal.   escitalopram 10 MG tablet Commonly known as: LEXAPRO Take 10 mg by mouth daily.   isosorbide mononitrate 60 MG 24 hr tablet  Commonly known as: IMDUR Take 1 tablet (60 mg total) by mouth daily. Start taking on: October 30, 2022   losartan 100 MG tablet Commonly known as: COZAAR Take 1 tablet (100 mg total) by mouth daily. Start taking on: October 30, 2022 What changed: additional instructions   scopolamine 1 MG/3DAYS Commonly known  as: TRANSDERM-SCOP Place 1 patch (1.5 mg total) onto the skin every 3 (three) days.   spironolactone 50 MG tablet Commonly known as: ALDACTONE Take 1 tablet (50 mg total) by mouth daily. Start taking on: October 30, 2022   valACYclovir 500 MG tablet Commonly known as: VALTREX Take 1 tablet by mouth daily as needed. For cold sore flare ups       Discharged Condition: stable  41 minutes of time have been dedicated to discharge assessment, planning and discharge instructions.   Signed:  Faythe Ghee [facility] 10/29/22 10:50 AM  Please see [email or web address] for pager details.  From 7A-7P if no response, please call [phone] After hours, please call ELink [phone]

## 2022-10-29 NOTE — Progress Notes (Signed)
Rounding Note    Patient Name: Mathew [name] Date of Encounter: 10/29/2022  [facility] Cardiologist: None   Subjective   Feeling ok this am. Had mild chest discomfort overnight, asymptomatic at present. No dyspnea.   Inpatient Medications    Scheduled Meds:  amLODipine  10 mg Oral Daily   atorvastatin  80 mg Oral QHS   carvedilol  25 mg Oral BID WC   Chlorhexidine Gluconate Cloth  6 each Topical Daily   enoxaparin (LOVENOX) injection  40 mg Subcutaneous Q24H   escitalopram  10 mg Oral Daily   feeding supplement  1 Container Oral TID BM   isosorbide mononitrate  60 mg Oral Daily   losartan  100 mg Oral Daily   spironolactone  25 mg Oral Daily   Continuous Infusions:  sodium chloride Stopped (10/28/22 0703)   clevidipine Stopped (10/27/22 2308)   esmolol Stopped (10/28/22 1340)   PRN Meds: alum & mag hydroxide-simeth, docusate sodium, melatonin, morphine injection, ondansetron (ZOFRAN) IV, mouth rinse, polyethylene glycol, prochlorperazine   Vital Signs    Vitals:   10/29/22 0430 10/29/22 0500 10/29/22 0530 10/29/22 0600  BP:   (!) 147/96 (!) 150/93  Pulse: 78 79 78 78  Resp: 18 (!) 21 20 (!) 22  Temp:      TempSrc:      SpO2: 96% 96% 98% 95%  Weight:      Height:        Intake/Output Summary (Last 24 hours) at 10/29/2022 0606 Last data filed at 10/29/2022 0200 Gross per 24 hour  Intake 188.76 ml  Output 1350 ml  Net -1161.24 ml      10/28/2022    5:00 AM 10/27/2022    4:33 AM 10/26/2022    5:00 AM  Last 3 Weights  Weight (lbs) 187 lb 6.3 oz 187 lb 13.3 oz 188 lb 11.4 oz  Weight (kg) 85 kg 85.2 kg 85.6 kg      Telemetry    Sinus rhythm with occasional PVCs - Personally Reviewed   Physical Exam  Alert, oriented, NAD GEN: No acute distress.   Neck: No JVD Cardiac: RRR, no murmurs, rubs, or gallops.  Respiratory: Clear to auscultation bilaterally. GI: Soft, nontender, non-distended  MS: No edema; No deformity. Neuro:  Nonfocal  Psych:  Normal affect   Labs    High Sensitivity Troponin:   Recent Labs  Lab 10/25/22 1100 10/25/22 1332  TROPONINIHS 15 14     Chemistry Recent Labs  Lab 10/23/22 1417 10/25/22 0945 10/27/22 0842 10/28/22 2113  NA 133* 133* 128*  --   K 3.0* 3.7 4.0  --   CL 97* 100 97*  --   CO2 24 23 19*  --   GLUCOSE 137* 97 105*  --   BUN 20 16 16   --   CREATININE 1.25* 1.27* 1.06  --   CALCIUM 9.8 9.1 8.4*  --   MG  --   --  1.3* 2.3  PROT 8.1 8.0 7.1  --   ALBUMIN 3.2* 2.7* 2.5*  --   AST 91* 95* 99*  --   ALT 17 23 19   --   ALKPHOS 143* 129* 109  --   BILITOT 1.1 1.0 1.5*  --   GFRNONAA >60 >60 >60  --   ANIONGAP 12 10 12   --     Lipids  Recent Labs  Lab 10/25/22 1100  CHOL 316*  TRIG 77  HDL 38*  LDLCALC 263*  CHOLHDL  8.3    Hematology Recent Labs  Lab 10/23/22 1417 10/25/22 0958 10/26/22 0417  WBC 7.7 6.8 9.1  RBC 6.81* 6.48* 6.84*  HGB 18.4* 17.7* 18.5*  HCT 55.2* 54.1* 55.8*  MCV 81.1 83.5 81.6  MCH 27.0 27.3 27.0  MCHC 33.3 32.7 33.2  RDW 19.8* 20.7* 20.2*  PLT 238 206 232   Thyroid  Recent Labs  Lab 10/23/22 1417  TSH 1.680    BNPNo results for input(s): "BNP", "PROBNP" in the last 168 hours.  DDimer No results for input(s): "DDIMER" in the last 168 hours.   Radiology    CT Angio Chest/Abd/Pel for Dissection W and/or W/WO  Result Date: 10/28/2022 CLINICAL DATA:  Follow-up dissection EXAM: CT ANGIOGRAPHY CHEST, ABDOMEN AND PELVIS TECHNIQUE: Non-contrast CT of the chest was initially obtained. Multidetector CT imaging through the chest, abdomen and pelvis was performed using the standard protocol during bolus administration of intravenous contrast. Multiplanar reconstructed images and MIPs were obtained and reviewed to evaluate the vascular anatomy. RADIATION DOSE REDUCTION: This exam was performed according to the departmental dose-optimization program which includes automated exposure control, adjustment of the mA and/or kV according to patient size  and/or use of iterative reconstruction technique. CONTRAST:  OMNIPAQUE IOHEXOL 350 MG/ML SOLN COMPARISON:  Chest CT dated Sep 10, 2022 FINDINGS: CTA CHEST FINDINGS Cardiovascular: Normal heart size. No pericardial effusion. No coronary artery calcifications. No suspicious filling defects of the central pulmonary arteries. Mediastinum/Nodes: Esophagus and thyroid are unremarkable. No enlarged lymph nodes seen in the chest. Lungs/Pleura: Central airways are patent. Moderate centrilobular emphysema. Bibasilar atelectasis. Stable mild lower lung predominant fibrosis. Redemonstrated numerous bilateral solid pulmonary nodules. No pleural effusion or pneumothorax. Musculoskeletal: No chest wall abnormality. No acute or significant osseous findings. Review of the MIP images confirms the above findings. CTA ABDOMEN AND PELVIS FINDINGS VASCULAR Aorta: Stable type B thrombosed dissection versus intramural hematoma extending from the origin of the left subclavian artery to just above the celiac artery. Mild atherosclerotic disease. Celiac: Patent without evidence of aneurysm, dissection, vasculitis or significant stenosis. SMA: Patent without evidence of aneurysm, dissection, vasculitis or significant stenosis. Renals: Both renal arteries are patent without evidence of aneurysm, dissection, vasculitis, fibromuscular dysplasia or significant stenosis. IMA: Patent without evidence of aneurysm, dissection, vasculitis or significant stenosis. Inflow: Patent without evidence of aneurysm, dissection, vasculitis or significant stenosis. Veins: No obvious venous abnormality within the limitations of this arterial phase study. Review of the MIP images confirms the above findings. NON-VASCULAR Hepatobiliary: Large mass of the right lobe of the liver, unchanged. Normal appearance of the gallbladder. No biliary ductal dilation. Pancreas: Unremarkable. No pancreatic ductal dilatation or surrounding inflammatory changes. Spleen: Left  upper quadrant splenules. Adrenals/Urinary Tract: Bilateral adrenal glands are unremarkable. No hydronephrosis nephrolithiasis. 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 Bladder is unremarkable. Stomach/Bowel: Stomach is within normal limits. Appendix appears normal. No evidence of bowel wall thickening, distention, or inflammatory changes. Lymphatic: No enlarged lymph nodes seen in the abdomen or pelvis Reproductive: Unremarkable. Other: No abdominal wall hernia or abnormality. No abdominopelvic ascites. Musculoskeletal: No acute or significant osseous findings. Review of the MIP images confirms the above findings. IMPRESSION: 1. Stable type B thrombosed dissection/intramural hematoma extending from the origin of the left subclavian artery to just above the celiac artery. 2. Redemonstrated numerous bilateral solid pulmonary nodules, concerning for metastatic disease. 3. Large mass of the right lobe of the liver, consistent  with history of hepatocellular carcinoma. 4. 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 5. Aortic Atherosclerosis (ICD10-I70.0) and Emphysema (ICD10-J43.9). Electronically Signed   By: Allegra Lai M.D.   On: 10/28/2022 11:15     Patient Profile     72 y.o. 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  Symptoms have resolved with no residual chest or abdominal pain.  Vascular surgery following. 2.  Hepatocellular carcinoma, advanced 3.  Hypertension: Patient with longstanding hypertension, remains hypertensive on multidrug therapy with goal doses of carvedilol, isosorbide,  amlodipine, spironolactone, and losartan.  Blood pressure control is adequate.  Would continue current medications.  Should have a follow-up metabolic panel within a few weeks.  Okay to follow-up with primary care as he has multiple providers including oncology and vascular surgery.  Plan discussed with patient and wife. Happy to see him back as needed in the outpatient setting.   [facility] will sign off.   Medication Recommendations: continue current medications Other recommendations (labs, testing, etc): None Follow up as an outpatient: Primary care  For questions or updates, please contact [facility] Please consult [email or web address] for contact info under        Signed, Tonny Bollman, MD  10/29/2022, 6:06 AM

## 2022-10-29 NOTE — Progress Notes (Signed)
This RN provided discharge instructions to patient. All questions answered. PIV removed. Patient taken to main entrance via wheelchair my Lisa,NT

## 2022-10-30 ENCOUNTER — Emergency Department (HOSPITAL_COMMUNITY): Payer: BC Managed Care – PPO

## 2022-10-30 ENCOUNTER — Other Ambulatory Visit: Payer: Self-pay

## 2022-10-30 ENCOUNTER — Inpatient Hospital Stay (HOSPITAL_COMMUNITY): Payer: BC Managed Care – PPO

## 2022-10-30 ENCOUNTER — Emergency Department (HOSPITAL_COMMUNITY): Payer: BC Managed Care – PPO | Admitting: Certified Registered"

## 2022-10-30 ENCOUNTER — Inpatient Hospital Stay (HOSPITAL_COMMUNITY)
Admission: EM | Admit: 2022-10-30 | Discharge: 2022-11-19 | Disposition: A | Discharge status: Rehabilitation Facility | Payer: BC Managed Care – PPO | Source: Home / Self Care | Attending: Internal Medicine | Admitting: Internal Medicine

## 2022-10-30 ENCOUNTER — Encounter (HOSPITAL_COMMUNITY)
Admission: EM | Disposition: A | Discharge status: Rehabilitation Facility | Payer: Self-pay | Source: Home / Self Care | Attending: Internal Medicine

## 2022-10-30 ENCOUNTER — Encounter (HOSPITAL_COMMUNITY): Payer: Self-pay | Admitting: Emergency Medicine

## 2022-10-30 DIAGNOSIS — Z8042 Family history of malignant neoplasm of prostate: Secondary | ICD-10-CM

## 2022-10-30 DIAGNOSIS — G9511 Acute infarction of spinal cord (embolic) (nonembolic): Secondary | ICD-10-CM | POA: Diagnosis not present

## 2022-10-30 DIAGNOSIS — I513 Intracardiac thrombosis, not elsewhere classified: Secondary | ICD-10-CM | POA: Diagnosis present

## 2022-10-30 DIAGNOSIS — Z1152 Encounter for screening for COVID-19: Secondary | ICD-10-CM

## 2022-10-30 DIAGNOSIS — G4733 Obstructive sleep apnea (adult) (pediatric): Secondary | ICD-10-CM | POA: Diagnosis present

## 2022-10-30 DIAGNOSIS — N170 Acute kidney failure with tubular necrosis: Secondary | ICD-10-CM | POA: Diagnosis not present

## 2022-10-30 DIAGNOSIS — J441 Chronic obstructive pulmonary disease with (acute) exacerbation: Secondary | ICD-10-CM | POA: Diagnosis not present

## 2022-10-30 DIAGNOSIS — D688 Other specified coagulation defects: Secondary | ICD-10-CM | POA: Diagnosis present

## 2022-10-30 DIAGNOSIS — C220 Liver cell carcinoma: Secondary | ICD-10-CM | POA: Diagnosis present

## 2022-10-30 DIAGNOSIS — R110 Nausea: Secondary | ICD-10-CM | POA: Diagnosis not present

## 2022-10-30 DIAGNOSIS — Z800 Family history of malignant neoplasm of digestive organs: Secondary | ICD-10-CM

## 2022-10-30 DIAGNOSIS — C7802 Secondary malignant neoplasm of left lung: Secondary | ICD-10-CM | POA: Diagnosis present

## 2022-10-30 DIAGNOSIS — J9601 Acute respiratory failure with hypoxia: Secondary | ICD-10-CM | POA: Diagnosis not present

## 2022-10-30 DIAGNOSIS — F17210 Nicotine dependence, cigarettes, uncomplicated: Secondary | ICD-10-CM | POA: Diagnosis present

## 2022-10-30 DIAGNOSIS — Z716 Tobacco abuse counseling: Secondary | ICD-10-CM

## 2022-10-30 DIAGNOSIS — I480 Paroxysmal atrial fibrillation: Secondary | ICD-10-CM | POA: Diagnosis present

## 2022-10-30 DIAGNOSIS — C7800 Secondary malignant neoplasm of unspecified lung: Secondary | ICD-10-CM | POA: Diagnosis present

## 2022-10-30 DIAGNOSIS — I71019 Dissection of thoracic aorta, unspecified: Secondary | ICD-10-CM | POA: Diagnosis not present

## 2022-10-30 DIAGNOSIS — Z515 Encounter for palliative care: Secondary | ICD-10-CM

## 2022-10-30 DIAGNOSIS — E8721 Acute metabolic acidosis: Secondary | ICD-10-CM | POA: Diagnosis present

## 2022-10-30 DIAGNOSIS — Z5112 Encounter for antineoplastic immunotherapy: Secondary | ICD-10-CM | POA: Insufficient documentation

## 2022-10-30 DIAGNOSIS — I10 Essential (primary) hypertension: Secondary | ICD-10-CM | POA: Diagnosis present

## 2022-10-30 DIAGNOSIS — R5383 Other fatigue: Secondary | ICD-10-CM | POA: Diagnosis not present

## 2022-10-30 DIAGNOSIS — C3490 Malignant neoplasm of unspecified part of unspecified bronchus or lung: Secondary | ICD-10-CM | POA: Diagnosis not present

## 2022-10-30 DIAGNOSIS — Z7982 Long term (current) use of aspirin: Secondary | ICD-10-CM

## 2022-10-30 DIAGNOSIS — E871 Hypo-osmolality and hyponatremia: Secondary | ICD-10-CM | POA: Diagnosis present

## 2022-10-30 DIAGNOSIS — E872 Acidosis, unspecified: Secondary | ICD-10-CM | POA: Diagnosis not present

## 2022-10-30 DIAGNOSIS — I71011 Dissection of aortic arch: Principal | ICD-10-CM

## 2022-10-30 DIAGNOSIS — I700 Atherosclerosis of aorta: Secondary | ICD-10-CM | POA: Diagnosis not present

## 2022-10-30 DIAGNOSIS — Z8546 Personal history of malignant neoplasm of prostate: Secondary | ICD-10-CM

## 2022-10-30 DIAGNOSIS — I7121 Aneurysm of the ascending aorta, without rupture: Secondary | ICD-10-CM | POA: Diagnosis not present

## 2022-10-30 DIAGNOSIS — R601 Generalized edema: Secondary | ICD-10-CM | POA: Diagnosis not present

## 2022-10-30 DIAGNOSIS — E785 Hyperlipidemia, unspecified: Secondary | ICD-10-CM | POA: Diagnosis present

## 2022-10-30 DIAGNOSIS — I7113 Aneurysm of the descending thoracic aorta, ruptured: Secondary | ICD-10-CM | POA: Diagnosis not present

## 2022-10-30 DIAGNOSIS — G9782 Other postprocedural complications and disorders of nervous system: Secondary | ICD-10-CM | POA: Diagnosis not present

## 2022-10-30 DIAGNOSIS — E860 Dehydration: Secondary | ICD-10-CM | POA: Diagnosis not present

## 2022-10-30 DIAGNOSIS — I629 Nontraumatic intracranial hemorrhage, unspecified: Secondary | ICD-10-CM | POA: Diagnosis not present

## 2022-10-30 DIAGNOSIS — E440 Moderate protein-calorie malnutrition: Secondary | ICD-10-CM | POA: Diagnosis present

## 2022-10-30 DIAGNOSIS — Y838 Other surgical procedures as the cause of abnormal reaction of the patient, or of later complication, without mention of misadventure at the time of the procedure: Secondary | ICD-10-CM | POA: Diagnosis not present

## 2022-10-30 DIAGNOSIS — Z95820 Peripheral vascular angioplasty status with implants and grafts: Secondary | ICD-10-CM | POA: Diagnosis not present

## 2022-10-30 DIAGNOSIS — K567 Ileus, unspecified: Secondary | ICD-10-CM | POA: Diagnosis not present

## 2022-10-30 DIAGNOSIS — Z79620 Long term (current) use of immunosuppressive biologic: Secondary | ICD-10-CM

## 2022-10-30 DIAGNOSIS — G9341 Metabolic encephalopathy: Secondary | ICD-10-CM | POA: Diagnosis not present

## 2022-10-30 DIAGNOSIS — Z6824 Body mass index (BMI) 24.0-24.9, adult: Secondary | ICD-10-CM

## 2022-10-30 DIAGNOSIS — I6930 Unspecified sequelae of cerebral infarction: Secondary | ICD-10-CM | POA: Diagnosis not present

## 2022-10-30 DIAGNOSIS — I6310 Cerebral infarction due to embolism of unspecified precerebral artery: Secondary | ICD-10-CM | POA: Diagnosis not present

## 2022-10-30 DIAGNOSIS — R6510 Systemic inflammatory response syndrome (SIRS) of non-infectious origin without acute organ dysfunction: Secondary | ICD-10-CM | POA: Diagnosis not present

## 2022-10-30 DIAGNOSIS — Z8619 Personal history of other infectious and parasitic diseases: Secondary | ICD-10-CM

## 2022-10-30 DIAGNOSIS — I7102 Dissection of abdominal aorta: Secondary | ICD-10-CM

## 2022-10-30 DIAGNOSIS — Z79899 Other long term (current) drug therapy: Secondary | ICD-10-CM

## 2022-10-30 DIAGNOSIS — G9519 Other vascular myelopathies: Secondary | ICD-10-CM | POA: Diagnosis present

## 2022-10-30 DIAGNOSIS — I97820 Postprocedural cerebrovascular infarction during cardiac surgery: Secondary | ICD-10-CM | POA: Diagnosis not present

## 2022-10-30 DIAGNOSIS — Z9081 Acquired absence of spleen: Secondary | ICD-10-CM

## 2022-10-30 DIAGNOSIS — Z66 Do not resuscitate: Secondary | ICD-10-CM | POA: Diagnosis not present

## 2022-10-30 DIAGNOSIS — Z9079 Acquired absence of other genital organ(s): Secondary | ICD-10-CM

## 2022-10-30 DIAGNOSIS — I614 Nontraumatic intracerebral hemorrhage in cerebellum: Secondary | ICD-10-CM | POA: Diagnosis not present

## 2022-10-30 DIAGNOSIS — N179 Acute kidney failure, unspecified: Secondary | ICD-10-CM | POA: Diagnosis present

## 2022-10-30 DIAGNOSIS — I71012 Dissection of descending thoracic aorta: Secondary | ICD-10-CM | POA: Diagnosis present

## 2022-10-30 DIAGNOSIS — E162 Hypoglycemia, unspecified: Secondary | ICD-10-CM | POA: Diagnosis not present

## 2022-10-30 DIAGNOSIS — R29898 Other symptoms and signs involving the musculoskeletal system: Secondary | ICD-10-CM | POA: Diagnosis not present

## 2022-10-30 DIAGNOSIS — J439 Emphysema, unspecified: Secondary | ICD-10-CM | POA: Diagnosis present

## 2022-10-30 DIAGNOSIS — G4700 Insomnia, unspecified: Secondary | ICD-10-CM | POA: Diagnosis present

## 2022-10-30 DIAGNOSIS — D62 Acute posthemorrhagic anemia: Secondary | ICD-10-CM | POA: Diagnosis not present

## 2022-10-30 DIAGNOSIS — F4321 Adjustment disorder with depressed mood: Secondary | ICD-10-CM | POA: Diagnosis present

## 2022-10-30 DIAGNOSIS — I639 Cerebral infarction, unspecified: Secondary | ICD-10-CM | POA: Diagnosis not present

## 2022-10-30 DIAGNOSIS — I7111 Aneurysm of the ascending aorta, ruptured: Secondary | ICD-10-CM | POA: Diagnosis present

## 2022-10-30 DIAGNOSIS — M542 Cervicalgia: Secondary | ICD-10-CM | POA: Diagnosis not present

## 2022-10-30 DIAGNOSIS — I63441 Cerebral infarction due to embolism of right cerebellar artery: Secondary | ICD-10-CM | POA: Diagnosis not present

## 2022-10-30 DIAGNOSIS — G936 Cerebral edema: Secondary | ICD-10-CM | POA: Diagnosis not present

## 2022-10-30 DIAGNOSIS — R7303 Prediabetes: Secondary | ICD-10-CM | POA: Diagnosis present

## 2022-10-30 DIAGNOSIS — C7801 Secondary malignant neoplasm of right lung: Secondary | ICD-10-CM | POA: Diagnosis present

## 2022-10-30 DIAGNOSIS — E876 Hypokalemia: Secondary | ICD-10-CM | POA: Diagnosis present

## 2022-10-30 DIAGNOSIS — Z7189 Other specified counseling: Secondary | ICD-10-CM | POA: Diagnosis not present

## 2022-10-30 DIAGNOSIS — J84112 Idiopathic pulmonary fibrosis: Secondary | ICD-10-CM | POA: Diagnosis not present

## 2022-10-30 DIAGNOSIS — R000 Tachycardia, unspecified: Secondary | ICD-10-CM | POA: Diagnosis not present

## 2022-10-30 DIAGNOSIS — J9811 Atelectasis: Secondary | ICD-10-CM | POA: Diagnosis not present

## 2022-10-30 DIAGNOSIS — G8314 Monoplegia of lower limb affecting left nondominant side: Secondary | ICD-10-CM | POA: Diagnosis not present

## 2022-10-30 DIAGNOSIS — R053 Chronic cough: Secondary | ICD-10-CM | POA: Diagnosis not present

## 2022-10-30 DIAGNOSIS — I69354 Hemiplegia and hemiparesis following cerebral infarction affecting left non-dominant side: Secondary | ICD-10-CM | POA: Diagnosis present

## 2022-10-30 DIAGNOSIS — T451X5A Adverse effect of antineoplastic and immunosuppressive drugs, initial encounter: Secondary | ICD-10-CM | POA: Diagnosis present

## 2022-10-30 DIAGNOSIS — Z720 Tobacco use: Secondary | ICD-10-CM | POA: Diagnosis not present

## 2022-10-30 DIAGNOSIS — I959 Hypotension, unspecified: Secondary | ICD-10-CM | POA: Diagnosis not present

## 2022-10-30 DIAGNOSIS — I6200 Nontraumatic subdural hemorrhage, unspecified: Secondary | ICD-10-CM | POA: Diagnosis not present

## 2022-10-30 DIAGNOSIS — C228 Malignant neoplasm of liver, primary, unspecified as to type: Secondary | ICD-10-CM | POA: Diagnosis not present

## 2022-10-30 DIAGNOSIS — E8809 Other disorders of plasma-protein metabolism, not elsewhere classified: Secondary | ICD-10-CM | POA: Diagnosis not present

## 2022-10-30 DIAGNOSIS — D72829 Elevated white blood cell count, unspecified: Secondary | ICD-10-CM | POA: Diagnosis present

## 2022-10-30 DIAGNOSIS — G8220 Paraplegia, unspecified: Secondary | ICD-10-CM | POA: Diagnosis not present

## 2022-10-30 DIAGNOSIS — R059 Cough, unspecified: Secondary | ICD-10-CM | POA: Diagnosis not present

## 2022-10-30 DIAGNOSIS — R5381 Other malaise: Secondary | ICD-10-CM | POA: Diagnosis not present

## 2022-10-30 DIAGNOSIS — R630 Anorexia: Secondary | ICD-10-CM | POA: Diagnosis not present

## 2022-10-30 HISTORY — PX: TEE WITHOUT CARDIOVERSION: SHX5443

## 2022-10-30 HISTORY — PX: THORACIC AORTIC ENDOVASCULAR STENT GRAFT: SHX6112

## 2022-10-30 LAB — BASIC METABOLIC PANEL
Anion gap: 11 (ref 5–15)
BUN: 15 mg/dL (ref 8–23)
CO2: 18 mmol/L — ABNORMAL LOW (ref 22–32)
Calcium: 8.8 mg/dL — ABNORMAL LOW (ref 8.9–10.3)
Chloride: 101 mmol/L (ref 98–111)
Creatinine, Ser: 1 mg/dL (ref 0.61–1.24)
GFR, Estimated: >60 mL/min (ref >60)
Glucose, Bld: 138 mg/dL — ABNORMAL HIGH (ref 70–99)
Potassium: 3.5 mmol/L (ref 3.5–5.1)
Sodium: 130 mmol/L — ABNORMAL LOW (ref 135–145)

## 2022-10-30 LAB — SURGICAL PCR SCREEN
MRSA, PCR: NEGATIVE
Staphylococcus aureus: NEGATIVE

## 2022-10-30 LAB — CBC
HCT: 51.1 % (ref 39.0–52.0)
HCT: 46.1 % (ref 39.0–52.0)
Hemoglobin: 17.5 g/dL — ABNORMAL HIGH (ref 13.0–17.0)
Hemoglobin: 15.1 g/dL (ref 13.0–17.0)
MCH: 27.2 pg (ref 26.0–34.0)
MCH: 26.7 pg (ref 26.0–34.0)
MCHC: 34.2 g/dL (ref 30.0–36.0)
MCHC: 32.8 g/dL (ref 30.0–36.0)
MCV: 79.5 fL — ABNORMAL LOW (ref 80.0–100.0)
MCV: 81.6 fL (ref 80.0–100.0)
Platelets: 237 10*3/uL (ref 150–400)
Platelets: 199 10*3/uL (ref 150–400)
RBC: 6.43 MIL/uL — ABNORMAL HIGH (ref 4.22–5.81)
RBC: 5.65 MIL/uL (ref 4.22–5.81)
RDW: 19.7 % — ABNORMAL HIGH (ref 11.5–15.5)
RDW: 18.9 % — ABNORMAL HIGH (ref 11.5–15.5)
WBC: 9.1 10*3/uL (ref 4.0–10.5)
WBC: 12.9 10*3/uL — ABNORMAL HIGH (ref 4.0–10.5)
nRBC: 0.2 % (ref 0.0–0.2)
nRBC: 0.2 % (ref 0.0–0.2)

## 2022-10-30 LAB — ECHO INTRAOPERATIVE TEE
Height: 75 in
Weight: 2992 oz

## 2022-10-30 LAB — ABO/RH: ABO/RH(D): O POS

## 2022-10-30 LAB — TYPE AND SCREEN
ABO/RH(D): O POS
Antibody Screen: NEGATIVE

## 2022-10-30 LAB — TROPONIN I (HIGH SENSITIVITY): Troponin I (High Sensitivity): 11 ng/L (ref <18)

## 2022-10-30 LAB — GLUCOSE, CAPILLARY: Glucose-Capillary: 117 mg/dL — ABNORMAL HIGH (ref 70–99)

## 2022-10-30 LAB — CREATININE, SERUM
Creatinine, Ser: 1.05 mg/dL (ref 0.61–1.24)
GFR, Estimated: >60 mL/min (ref >60)

## 2022-10-30 SURGERY — INSERTION, ENDOVASCULAR STENT GRAFT, AORTA, THORACIC
Anesthesia: General | Site: Chest

## 2022-10-30 MED ORDER — CEFAZOLIN SODIUM-DEXTROSE 2-4 GM/100ML-% IV SOLN
2.0000 g | Freq: Three times a day (TID) | INTRAVENOUS | Status: AC
  Administered 2022-10-31: 2 g via INTRAVENOUS
  Filled 2022-10-30 (×2): qty 100

## 2022-10-30 MED ORDER — SODIUM CHLORIDE 0.9 % IV SOLN: INTRAVENOUS | Status: DC | PRN

## 2022-10-30 MED ORDER — IOHEXOL 350 MG/ML SOLN
75.0000 mL | Freq: Once | INTRAVENOUS | Status: AC | PRN
  Administered 2022-10-30: 75 mL via INTRAVENOUS

## 2022-10-30 MED ORDER — HYDROMORPHONE HCL 1 MG/ML IJ SOLN: 0.2500 mg | INTRAMUSCULAR | Status: DC | PRN

## 2022-10-30 MED ORDER — PHENYLEPHRINE HCL-NACL 20-0.9 MG/250ML-% IV SOLN
INTRAVENOUS | Status: DC | PRN
  Administered 2022-10-30: 40 ug/min via INTRAVENOUS

## 2022-10-30 MED ORDER — PROPOFOL 10 MG/ML IV BOLUS
INTRAVENOUS | Status: DC | PRN
  Administered 2022-10-30: 50 mg via INTRAVENOUS

## 2022-10-30 MED ORDER — DEXAMETHASONE SODIUM PHOSPHATE 10 MG/ML IJ SOLN
INTRAMUSCULAR | Status: DC | PRN
  Administered 2022-10-30: 10 mg via INTRAVENOUS

## 2022-10-30 MED ORDER — CHLORHEXIDINE GLUCONATE 0.12 % MT SOLN
OROMUCOSAL | Status: AC
  Filled 2022-10-30: qty 15

## 2022-10-30 MED ORDER — HEPARIN SODIUM (PORCINE) 1000 UNIT/ML IJ SOLN
INTRAMUSCULAR | Status: AC
  Filled 2022-10-30: qty 1

## 2022-10-30 MED ORDER — PHENYLEPHRINE 80 MCG/ML (10ML) SYRINGE FOR IV PUSH (FOR BLOOD PRESSURE SUPPORT)
PREFILLED_SYRINGE | INTRAVENOUS | Status: DC | PRN
  Administered 2022-10-30: 40 ug via INTRAVENOUS
  Administered 2022-10-30: 80 ug via INTRAVENOUS
  Administered 2022-10-30: 40 ug via INTRAVENOUS
  Administered 2022-10-30: 80 ug via INTRAVENOUS

## 2022-10-30 MED ORDER — CEFAZOLIN SODIUM 1 G IJ SOLR
INTRAMUSCULAR | Status: AC
  Filled 2022-10-30: qty 20

## 2022-10-30 MED ORDER — 0.9 % SODIUM CHLORIDE (POUR BTL) OPTIME
TOPICAL | Status: DC | PRN
  Administered 2022-10-30: 1000 mL

## 2022-10-30 MED ORDER — LACTATED RINGERS IV SOLN: INTRAVENOUS | Status: DC | PRN

## 2022-10-30 MED ORDER — FENTANYL CITRATE PF 50 MCG/ML IJ SOSY
75.0000 ug | PREFILLED_SYRINGE | Freq: Once | INTRAMUSCULAR | Status: AC
  Administered 2022-10-30: 75 ug via INTRAVENOUS
  Filled 2022-10-30: qty 2

## 2022-10-30 MED ORDER — MORPHINE SULFATE (PF) 4 MG/ML IV SOLN
4.0000 mg | Freq: Once | INTRAVENOUS | Status: AC
  Administered 2022-10-30: 4 mg via INTRAVENOUS
  Filled 2022-10-30: qty 1

## 2022-10-30 MED ORDER — EPHEDRINE 5 MG/ML INJ
INTRAVENOUS | Status: AC
  Filled 2022-10-30: qty 10

## 2022-10-30 MED ORDER — CEFAZOLIN SODIUM-DEXTROSE 2-4 GM/100ML-% IV SOLN
INTRAVENOUS | Status: AC
  Administered 2022-10-30: 2 g via INTRAVENOUS
  Filled 2022-10-30: qty 100

## 2022-10-30 MED ORDER — HEPARIN 6000 UNIT IRRIGATION SOLUTION
Status: AC
  Filled 2022-10-30: qty 500

## 2022-10-30 MED ORDER — MIDAZOLAM HCL 2 MG/2ML IJ SOLN
INTRAMUSCULAR | Status: DC | PRN
  Administered 2022-10-30: 2 mg via INTRAVENOUS

## 2022-10-30 MED ORDER — FAMOTIDINE 20 MG PO TABS
20.0000 mg | ORAL_TABLET | Freq: Two times a day (BID) | ORAL | Status: DC
  Administered 2022-10-30 – 2022-11-01 (×5): 20 mg via ORAL
  Filled 2022-10-30 (×5): qty 1

## 2022-10-30 MED ORDER — OXYCODONE-ACETAMINOPHEN 5-325 MG PO TABS
1.0000 | ORAL_TABLET | ORAL | Status: DC | PRN
  Administered 2022-10-30: 1 via ORAL
  Administered 2022-10-31 – 2022-11-01 (×2): 2 via ORAL
  Administered 2022-11-01 – 2022-11-07 (×2): 1 via ORAL
  Administered 2022-11-08 – 2022-11-15 (×4): 2 via ORAL
  Filled 2022-10-30 (×2): qty 1
  Filled 2022-10-30 (×2): qty 2
  Filled 2022-10-30: qty 1
  Filled 2022-10-30: qty 2
  Filled 2022-10-30: qty 1
  Filled 2022-10-30 (×2): qty 2
  Filled 2022-10-30: qty 1

## 2022-10-30 MED ORDER — IODIXANOL 320 MG/ML IV SOLN
INTRAVENOUS | Status: DC | PRN
  Administered 2022-10-30: 96 mL via INTRA_ARTERIAL

## 2022-10-30 MED ORDER — HYDROMORPHONE HCL 1 MG/ML IJ SOLN
0.5000 mg | INTRAMUSCULAR | Status: DC | PRN
  Administered 2022-10-30 – 2022-11-01 (×6): 0.5 mg via INTRAVENOUS
  Filled 2022-10-30 (×6): qty 0.5

## 2022-10-30 MED ORDER — ROCURONIUM BROMIDE 10 MG/ML (PF) SYRINGE
PREFILLED_SYRINGE | INTRAVENOUS | Status: AC
  Filled 2022-10-30: qty 20

## 2022-10-30 MED ORDER — SUCCINYLCHOLINE CHLORIDE 200 MG/10ML IV SOSY
PREFILLED_SYRINGE | INTRAVENOUS | Status: DC | PRN
  Administered 2022-10-30: 120 mg via INTRAVENOUS

## 2022-10-30 MED ORDER — PHENOL 1.4 % MT LIQD
1.0000 | OROMUCOSAL | Status: DC | PRN
  Administered 2022-10-31 – 2022-11-06 (×2): 1 via OROMUCOSAL
  Filled 2022-10-30 (×2): qty 177

## 2022-10-30 MED ORDER — CEFAZOLIN SODIUM-DEXTROSE 2-3 GM-%(50ML) IV SOLR
INTRAVENOUS | Status: DC | PRN
  Administered 2022-10-30: 2 g via INTRAVENOUS

## 2022-10-30 MED ORDER — FAMOTIDINE 20 MG PO TABS: 20.0000 mg | ORAL_TABLET | Freq: Two times a day (BID) | ORAL | Status: DC

## 2022-10-30 MED ORDER — FENTANYL CITRATE (PF) 250 MCG/5ML IJ SOLN
INTRAMUSCULAR | Status: DC | PRN
  Administered 2022-10-30: 100 ug via INTRAVENOUS
  Administered 2022-10-30 (×3): 50 ug via INTRAVENOUS

## 2022-10-30 MED ORDER — INSULIN ASPART 100 UNIT/ML IJ SOLN
0.0000 [IU] | INTRAMUSCULAR | Status: DC
  Administered 2022-10-31 – 2022-11-06 (×3): 1 [IU] via SUBCUTANEOUS

## 2022-10-30 MED ORDER — MIDAZOLAM HCL 2 MG/2ML IJ SOLN
INTRAMUSCULAR | Status: AC
  Filled 2022-10-30: qty 2

## 2022-10-30 MED ORDER — PHENYLEPHRINE 80 MCG/ML (10ML) SYRINGE FOR IV PUSH (FOR BLOOD PRESSURE SUPPORT)
PREFILLED_SYRINGE | INTRAVENOUS | Status: AC
  Filled 2022-10-30: qty 30

## 2022-10-30 MED ORDER — CLEVIDIPINE BUTYRATE 0.5 MG/ML IV EMUL
0.0000 mg/h | INTRAVENOUS | Status: DC
  Administered 2022-10-30: 2 mg/h via INTRAVENOUS
  Administered 2022-10-31: 20 mg/h via INTRAVENOUS
  Administered 2022-10-31: 16 mg/h via INTRAVENOUS
  Administered 2022-10-31: 21 mg/h via INTRAVENOUS
  Administered 2022-10-31: 16 mg/h via INTRAVENOUS
  Administered 2022-10-31: 13 mg/h via INTRAVENOUS
  Administered 2022-10-31: 15 mg/h via INTRAVENOUS
  Administered 2022-10-31 – 2022-11-01 (×3): 16 mg/h via INTRAVENOUS
  Filled 2022-10-30 (×10): qty 100

## 2022-10-30 MED ORDER — HEPARIN SODIUM (PORCINE) 1000 UNIT/ML IJ SOLN
INTRAMUSCULAR | Status: DC | PRN
  Administered 2022-10-30: 2000 [IU] via INTRAVENOUS
  Administered 2022-10-30: 5000 [IU] via INTRAVENOUS

## 2022-10-30 MED ORDER — ONDANSETRON HCL 4 MG/2ML IJ SOLN
4.0000 mg | Freq: Four times a day (QID) | INTRAMUSCULAR | Status: DC | PRN
  Administered 2022-10-31 – 2022-11-01 (×4): 4 mg via INTRAVENOUS
  Filled 2022-10-30 (×5): qty 2

## 2022-10-30 MED ORDER — POLYETHYLENE GLYCOL 3350 17 G PO PACK: 17.0000 g | PACK | Freq: Every day | ORAL | Status: DC | PRN

## 2022-10-30 MED ORDER — ROCURONIUM BROMIDE 10 MG/ML (PF) SYRINGE
PREFILLED_SYRINGE | INTRAVENOUS | Status: DC | PRN
  Administered 2022-10-30: 50 mg via INTRAVENOUS
  Administered 2022-10-30: 20 mg via INTRAVENOUS

## 2022-10-30 MED ORDER — ATORVASTATIN CALCIUM 80 MG PO TABS
80.0000 mg | ORAL_TABLET | Freq: Every day | ORAL | Status: DC
  Administered 2022-10-30 – 2022-11-18 (×19): 80 mg via ORAL
  Filled 2022-10-30 (×19): qty 1

## 2022-10-30 MED ORDER — PROPOFOL 10 MG/ML IV BOLUS
INTRAVENOUS | Status: AC
  Filled 2022-10-30: qty 20

## 2022-10-30 MED ORDER — HEPARIN 6000 UNIT IRRIGATION SOLUTION
Status: DC | PRN
  Administered 2022-10-30: 1

## 2022-10-30 MED ORDER — FENTANYL CITRATE (PF) 250 MCG/5ML IJ SOLN
INTRAMUSCULAR | Status: AC
  Filled 2022-10-30: qty 5

## 2022-10-30 MED ORDER — AMISULPRIDE (ANTIEMETIC) 5 MG/2ML IV SOLN: 10.0000 mg | Freq: Once | INTRAVENOUS | Status: DC | PRN

## 2022-10-30 MED ORDER — ONDANSETRON HCL 4 MG/2ML IJ SOLN
4.0000 mg | Freq: Once | INTRAMUSCULAR | Status: AC
  Administered 2022-10-30: 4 mg via INTRAVENOUS
  Filled 2022-10-30: qty 2

## 2022-10-30 MED ORDER — DOCUSATE SODIUM 100 MG PO CAPS
100.0000 mg | ORAL_CAPSULE | Freq: Two times a day (BID) | ORAL | Status: DC | PRN
  Administered 2022-11-12 – 2022-11-13 (×2): 100 mg via ORAL
  Filled 2022-10-30 (×4): qty 1

## 2022-10-30 MED ORDER — HEPARIN 6000 UNIT IRRIGATION SOLUTION
Status: AC
  Filled 2022-10-30: qty 1000

## 2022-10-30 MED ORDER — PANTOPRAZOLE SODIUM 40 MG PO TBEC
40.0000 mg | DELAYED_RELEASE_TABLET | Freq: Every day | ORAL | Status: DC
  Administered 2022-10-30 – 2022-11-01 (×3): 40 mg via ORAL
  Filled 2022-10-30 (×3): qty 1

## 2022-10-30 MED ORDER — HEPARIN SODIUM (PORCINE) 5000 UNIT/ML IJ SOLN
5000.0000 [IU] | Freq: Three times a day (TID) | INTRAMUSCULAR | Status: DC
  Administered 2022-10-31 – 2022-11-01 (×4): 5000 [IU] via SUBCUTANEOUS
  Filled 2022-10-30 (×5): qty 1

## 2022-10-30 MED ORDER — ONDANSETRON HCL 4 MG/2ML IJ SOLN: 4.0000 mg | Freq: Four times a day (QID) | INTRAMUSCULAR | Status: DC | PRN

## 2022-10-30 MED ORDER — ASPIRIN 81 MG PO TBEC
81.0000 mg | DELAYED_RELEASE_TABLET | Freq: Every day | ORAL | Status: DC
  Administered 2022-10-31 – 2022-11-02 (×3): 81 mg via ORAL
  Filled 2022-10-30 (×4): qty 1

## 2022-10-30 MED ORDER — PROTAMINE SULFATE 10 MG/ML IV SOLN
INTRAVENOUS | Status: DC | PRN
  Administered 2022-10-30: 50 mg via INTRAVENOUS

## 2022-10-30 MED ORDER — SUGAMMADEX SODIUM 200 MG/2ML IV SOLN
INTRAVENOUS | Status: DC | PRN
  Administered 2022-10-30: 200 mg via INTRAVENOUS

## 2022-10-30 MED ORDER — SODIUM CHLORIDE 0.9 % IV SOLN: 500.0000 mL | Freq: Once | INTRAVENOUS | Status: DC | PRN

## 2022-10-30 MED ORDER — MEPERIDINE HCL 25 MG/ML IJ SOLN: 6.2500 mg | INTRAMUSCULAR | Status: DC | PRN

## 2022-10-30 MED ORDER — ONDANSETRON HCL 4 MG/2ML IJ SOLN
INTRAMUSCULAR | Status: AC
  Filled 2022-10-30: qty 2

## 2022-10-30 SURGICAL SUPPLY — 74 items
ADH SKN CLS APL DERMABOND .7 (GAUZE/BANDAGES/DRESSINGS) ×6
BAG COUNTER SPONGE SURGICOUNT (BAG) ×2 IMPLANT
BAG SPNG CNTER NS LX DISP (BAG) ×2
BALLN CODA OCL 2-10-35-140-46 (BALLOONS) ×2
BALLN MUSTANG 4.0X20 75 (BALLOONS) ×2
BALLOON COD OCL 2-10-35-140-46 (BALLOONS) IMPLANT
BALLOON MUSTANG 4.0X20 75 (BALLOONS) IMPLANT
BNDG CMPR 5X4 KNIT ELC UNQ LF (GAUZE/BANDAGES/DRESSINGS) ×2
BNDG ELASTIC 4INX 5YD STR LF (GAUZE/BANDAGES/DRESSINGS) IMPLANT
BRUSH SCRUB EZ PLAIN DRY (MISCELLANEOUS) IMPLANT
CANISTER SUCT 3000ML PPV (MISCELLANEOUS) ×2 IMPLANT
CATH ACCU-VU SIZ PIG 5F 100CM (CATHETERS) ×2 IMPLANT
CATH AURYON ATHERECTOMY 2.35 (CATHETERS) IMPLANT
CATH VISIONS PV .035 IVUS (CATHETERS) IMPLANT
CLOSURE PERCLOSE PROSTYLE (VASCULAR PRODUCTS) IMPLANT
COVER PROBE W GEL 5X96 (DRAPES) ×2 IMPLANT
DERMABOND ADVANCED .7 DNX12 (GAUZE/BANDAGES/DRESSINGS) ×2 IMPLANT
DEVICE CLOSURE PERCLS PRGLD 6F (VASCULAR PRODUCTS) ×4 IMPLANT
DEVICE VASC CLSR CELT ART 7 (Vascular Products) IMPLANT
DRAPE BRACHIAL (DRAPES) IMPLANT
DRAPE EXTREMITY T 121X128X90 (DISPOSABLE) IMPLANT
DRSG TEGADERM 2-3/8X2-3/4 SM (GAUZE/BANDAGES/DRESSINGS) ×2 IMPLANT
ELECT CAUTERY BLADE 6.4 (BLADE) IMPLANT
ELECT REM PT RETURN 9FT ADLT (ELECTROSURGICAL) ×4
ELECTRODE REM PT RTRN 9FT ADLT (ELECTROSURGICAL) ×4 IMPLANT
GLOVE BIO SURGEON STRL SZ7 (GLOVE) IMPLANT
GLOVE BIOGEL PI IND STRL 8.5 (GLOVE) IMPLANT
GLOVE SURG SS PI 7.5 STRL IVOR (GLOVE) ×6 IMPLANT
GOWN STRL REUS W/ TWL LRG LVL3 (GOWN DISPOSABLE) ×4 IMPLANT
GOWN STRL REUS W/ TWL XL LVL3 (GOWN DISPOSABLE) ×2 IMPLANT
GOWN STRL REUS W/TWL LRG LVL3 (GOWN DISPOSABLE) ×4
GOWN STRL REUS W/TWL XL LVL3 (GOWN DISPOSABLE) ×2
GRAFT BALLN CATH 65CM (BALLOONS) IMPLANT
GRAFT ENDOVASC 34X154 20FR (Endovascular Graft) IMPLANT
GRAFT ENDOVASC 34X79 20FR (Endovascular Graft) ×2 IMPLANT
GRAFT ENDOVASC 79X34 20FR (Endovascular Graft) IMPLANT
KIT BASIN OR (CUSTOM PROCEDURE TRAY) ×2 IMPLANT
KIT ENCORE 26 ADVANTAGE (KITS) IMPLANT
KIT TURNOVER KIT B (KITS) ×2 IMPLANT
LINER CANISTER 1000CC FLEX (MISCELLANEOUS) IMPLANT
NS IRRIG 1000ML POUR BTL (IV SOLUTION) ×2 IMPLANT
PACK ENDOVASCULAR (PACKS) ×2 IMPLANT
PAD ARMBOARD 7.5X6 YLW CONV (MISCELLANEOUS) ×4 IMPLANT
PENCIL BUTTON HOLSTER BLD 10FT (ELECTRODE) IMPLANT
PERCLOSE PROGLIDE 6F (VASCULAR PRODUCTS) ×4
SET MICROPUNCTURE 5F STIFF (MISCELLANEOUS) ×2 IMPLANT
SHEATH DRYSEAL FLEX 20FR 33CM (SHEATH) IMPLANT
SHEATH GUIDING 7F 55X73X9MM TD (SHEATH) IMPLANT
SHEATH PINNACLE 5F 10CM (SHEATH) IMPLANT
SHEATH PINNACLE 6F 10CM (SHEATH) IMPLANT
SHEATH PINNACLE 7F 10CM (SHEATH) IMPLANT
SHEATH PINNACLE 8F 10CM (SHEATH) ×2 IMPLANT
SHIELD RADPAD SCOOP 12X17 (MISCELLANEOUS) IMPLANT
SLEEVE ISOL F/PACE RF HD COVER (MISCELLANEOUS) IMPLANT
STENT VIABAHN 29XCATH135 (Permanent Stent) IMPLANT
STENT VIABAHN 8X29 7FR 135 (Permanent Stent) ×2 IMPLANT
STOCKINETTE 6 STRL (DRAPES) IMPLANT
STOPCOCK MORSE 400PSI 3WAY (MISCELLANEOUS) ×2 IMPLANT
SUT MNCRL AB 4-0 PS2 18 (SUTURE) IMPLANT
SUT VIC AB 3-0 SH 27 (SUTURE)
SUT VIC AB 3-0 SH 27X BRD (SUTURE) IMPLANT
SUT VICRYL 4-0 PS2 18IN ABS (SUTURE) IMPLANT
SYR 50ML LL SCALE MARK (SYRINGE) IMPLANT
SYR MEDRAD MARK V 150ML (SYRINGE) IMPLANT
TOWEL GREEN STERILE (TOWEL DISPOSABLE) ×2 IMPLANT
TRAY FOLEY MTR SLVR 16FR STAT (SET/KITS/TRAYS/PACK) ×2 IMPLANT
TUBE CONNECTING 12X1/4 (SUCTIONS) IMPLANT
TUBING HIGH PRESSURE 120CM (CONNECTOR) ×2 IMPLANT
TUBING INJECTOR 48 (MISCELLANEOUS) IMPLANT
WIRE BENTSON .035X145CM (WIRE) ×2 IMPLANT
WIRE COONS/BENSON .038X145CM (WIRE) IMPLANT
WIRE HI TORQ VERSACORE J 260CM (WIRE) IMPLANT
WIRE SPARTACORE .014X300CM (WIRE) IMPLANT
WIRE STIFF LUNDERQUIST 260CM (WIRE) ×2 IMPLANT

## 2022-10-30 NOTE — Progress Notes (Signed)
IR appointment for Friday 11/01/22 has been cancelled per provider request.  Patient and IR have been made aware.  No further concerns at this time.

## 2022-10-30 NOTE — Transfer of Care (Signed)
Immediate Anesthesia Transfer of Care Note  Patient: Mathew [name]  Procedure(s) Performed: THORACIC AORTIC ENDOVASCULAR STENT GRAFT with Left Subclavian artery revascularization with laser. (Chest) TRANSESOPHAGEAL ECHOCARDIOGRAM  Patient Location: PACU  Anesthesia Type:General  Level of Consciousness: sedated  Airway & Oxygen Therapy: Patient Spontanous Breathing  Post-op Assessment: Report given to RN and Post -op Vital signs reviewed and stable  Post vital signs: Reviewed and stable  Last Vitals:  Vitals Value Taken Time  BP 124/85 10/30/22 2009  Temp    Pulse 66 10/30/22 2013  Resp 13 10/30/22 2013  SpO2 94 % 10/30/22 2013  Vitals shown include unvalidated device data.  Last Pain:  Vitals:   10/30/22 1640  TempSrc: Oral  PainSc:          Complications: No notable events documented.

## 2022-10-30 NOTE — Anesthesia Preprocedure Evaluation (Addendum)
Anesthesia Evaluation  Patient identified by MRN, date of birth, ID band Patient awake    Reviewed: Allergy & Precautions, NPO status , Patient's Chart, lab work & pertinent test results, reviewed documented beta blocker date and time   History of Anesthesia Complications Negative for: history of anesthetic complications  Airway Mallampati: II  TM Distance: >3 FB Neck ROM: Full    Dental  (+) Dental Advisory Given, Teeth Intact   Pulmonary sleep apnea , former smoker  Idiopathic pulm fibrosis    Pulmonary exam normal        Cardiovascular hypertension, Pt. on medications and Pt. on home beta blockers Normal cardiovascular exam+ dysrhythmias Atrial Fibrillation    Thoracic aortic dissection, now ruptured/contained  '24 TTE - EF 55%. There is moderate asymmetric left ventricular hypertrophy of the septal segment. Grade I diastolic dysfunction (impaired relaxation). Trivial mitral valve  regurgitation.     Neuro/Psych negative neurological ROS  negative psych ROS   GI/Hepatic ,GERD  Medicated and Controlled,,(+) Hepatitis -, C Metastatic HCC    Endo/Other   Na 130   Renal/GU  Hx b/l RCC     Prostate cancer     Musculoskeletal negative musculoskeletal ROS (+)    Abdominal   Peds  Hematology  S/p splenectomy    Anesthesia Other Findings CTA Chest - IMPRESSION: 1. Significant progression of aortic dissection with a focal area of new active extravasation just past the left subclavian artery with a gastric at dimensions pulmonary-matters vascular pulmonary nodules significant hematoma in the mediastinum and pericardium new since the prior study.  2. New small intramural bleed involving the abdominal aorta just below the right renal artery takeoff. 3. Small focal dissection in the celiac artery but no occlusion. 4. Stable large partially necrotic right hepatic lobe mass. 5. Small left pleural effusion and  bibasilar atelectasis.   Reproductive/Obstetrics                             Anesthesia Physical Anesthesia Plan  ASA: 5 and emergent  Anesthesia Plan: General   Post-op Pain Management:    Induction: Intravenous  PONV Risk Score and Plan: 2 and Treatment may vary due to age or medical condition, Ondansetron and Dexamethasone  Airway Management Planned: Oral ETT  Additional Equipment: Arterial line, CVP and Ultrasound Guidance Line Placement  Intra-op Plan:   Post-operative Plan: Possible Post-op intubation/ventilation  Informed Consent: I have reviewed the patients History and Physical, chart, labs and discussed the procedure including the risks, benefits and alternatives for the proposed anesthesia with the patient or authorized representative who has indicated his/her understanding and acceptance.     Dental advisory given  Plan Discussed with: CRNA and Anesthesiologist  Anesthesia Plan Comments:         Anesthesia Quick Evaluation

## 2022-10-30 NOTE — ED Triage Notes (Signed)
Patient arrives by POV c/o central chest pain since last week. Patient states he was discharged from hospital yesterday. Also having intermittent shortness of breath. States no change in symptoms since he was admitted.

## 2022-10-30 NOTE — Anesthesia Procedure Notes (Signed)
Central Venous Catheter Insertion Performed by: Beryle Lathe, MD, anesthesiologist Start/End6/26/2024 4:42 PM, 10/30/2022 4:50 PM Patient location: Pre-op. Preanesthetic checklist: patient identified, IV checked, risks and benefits discussed, surgical consent, monitors and equipment checked, pre-op evaluation, timeout performed and anesthesia consent Position: Trendelenburg Lidocaine 1% used for infiltration and patient sedated Hand hygiene performed , maximum sterile barriers used  and Seldinger technique used Catheter size: 8.5 Fr Central line was placed.MAC introducer Procedure performed using ultrasound guided technique. Ultrasound Notes:anatomy identified, needle tip was noted to be adjacent to the nerve/plexus identified, no ultrasound evidence of intravascular and/or intraneural injection and image(s) printed for medical record Attempts: 1 Following insertion, line sutured, dressing applied and Biopatch. Post procedure assessment: blood return through all ports, no air and free fluid flow  Patient tolerated the procedure well with no immediate complications.

## 2022-10-30 NOTE — ED Notes (Signed)
ED Provider at bedside. 

## 2022-10-30 NOTE — Anesthesia Procedure Notes (Signed)
Procedure Name: Intubation Date/Time: 10/30/2022 5:32 PM  Performed by: Cheree Ditto, CRNAPre-anesthesia Checklist: Patient identified, Emergency Drugs available, Suction available and Patient being monitored Patient Re-evaluated:Patient Re-evaluated prior to induction Oxygen Delivery Method: Circle system utilized Preoxygenation: Pre-oxygenation with 100% oxygen Induction Type: Cricoid Pressure applied, IV induction and Rapid sequence Laryngoscope Size: Mac and 4 Grade View: Grade I Tube type: Oral Number of attempts: 1 Airway Equipment and Method: Stylet and Oral airway Placement Confirmation: ETT inserted through vocal cords under direct vision, positive ETCO2 and breath sounds checked- equal and bilateral Secured at: 22 cm Tube secured with: Tape Dental Injury: Teeth and Oropharynx as per pre-operative assessment

## 2022-10-30 NOTE — Consult Note (Signed)
NAME:  Mathew [name], MRN:  [id number], DOB:  Sep 13, 1950, LOS: 0 ADMISSION DATE:  10/30/2022, CONSULTATION DATE:  6/26 REFERRING MD:  Dr. Myra Gianotti , CHIEF COMPLAINT:  Ruptured aortic aneurysm   History of Present Illness:  72 year old male with past medical history as below, which is significant for hepatocellular carcinoma on chemotherapy, paroxysmal atrial fibrillation, hypertension, and hepatitis C status post treatment.  He initially presented to [facility] emergency department on 6/21 with complaints of sudden onset chest pain and was diagnosed with thrombosed type B thoracic aortic dissection extending from the distal aortic arch to the level of the diaphragm.  He was admitted to the ICU for tight hemodynamic control and was ultimately discharged on 6/25 after having no expansion of the dissection.  He now presents again on 6/26 with the recurrence of chest pain.  CT angiogram in the emergency department demonstrated significant progression of the aortic dissection with focal area of active extravasation.  Vascular surgery was consulted and took the patient emergently to the operating room for treatment of aortic rupture with plans for endovascular repair.  PCCM was asked to admit to ICU postoperatively.  The L subclavian was covered with the stenting and surgery was able to provide outlet however states that the pressures in the L to R will still likely vary and the R extremity should be utilized for BP control.    Ccm has been asked to admit for medical management and BP control post operatively.   Pertinent  Medical History   has a past medical history of Hepatitis C, Hypertension, Obstructive sleep apnea, Premature ventricular contraction, and Prostate cancer (HCC) (2006).   Significant Hospital Events: Including procedures, antibiotic start and stop dates in addition to other pertinent events   Discharged with Type B Ao dissection with intramural thrombosis 6/25 Returned for admittance  6/26 with ascending Ao rupture Emergent OR for repair  Interim History / Subjective:  [id number]  Objective   Blood pressure 124/85, pulse 64, temperature (!) 97 F (36.1 C), resp. rate (!) 29, height 6\' 3"  (1.905 m), weight 84.8 kg, SpO2 94 %.        Intake/Output Summary (Last 24 hours) at 10/30/2022 2028 Last data filed at 10/30/2022 1952 Gross per 24 hour  Intake 1310 ml  Output 450 ml  Net 860 ml   Filed Weights   10/30/22 1154  Weight: 84.8 kg    Examination: General: Adult male resting comfortably in bed HENT: Coyle/At, PERRL, no JVD Lungs: Clear bilateral breath sounds, no distress Cardiovascular: RRR, no MRG Abdomen: Soft, NT, ND hyperactive Extremities: No acute deformity. Palpable pulses x4 Neuro: Alert, oriented, non-focal. Intact sensation in all extremities.   Resolved Hospital Problem list     Assessment & Plan:   Aortic dissection with rupture:-Status post emergent endovascular repair  6/26> L subclavian was covered during surgery. Outlet was able to be provided, but LUE BP expected to be lower than R and vascular surgery suggests we use the right arm for BP measurement.  -Admit to ICU for close hemodynamic monitoring -Use right arm for BP measurement. Currently has art line in place.  - Cleviprex infusion for SBP goal < 120 mmhg. -Management per vascular surgery  Hyponatremia NAG acidosis Hypokalemia borderline - repeat BMP  Hypertension HLD - Goal as above - Holding home meds for tonight in favor of IV control.  - resume atorvastatin  Hepatocellular carcinoma: cT3N1M0 stage IVA- Patient is on Treatment Plan : LUNG Atezolizumab + Bevacizumab Maintenance q21d  -  supportive care   Best Practice (right click and "Reselect all SmartList Selections" daily)   Diet/type: Regular consistency (see orders) > advance as tolerated DVT prophylaxis: prophylactic heparin  GI prophylaxis: N/A Lines: Central line and Arterial Line Foley:  N/A Code Status:   full code Last date of multidisciplinary goals of care discussion [ ]   Labs   CBC: Recent Labs  Lab 10/25/22 0958 10/26/22 0417 10/30/22 1203  WBC 6.8 9.1 9.1  HGB 17.7* 18.5* 17.5*  HCT 54.1* 55.8* 51.1  MCV 83.5 81.6 79.5*  PLT 206 232 237    Basic Metabolic Panel: Recent Labs  Lab 10/25/22 0945 10/27/22 0842 10/28/22 2113 10/30/22 1203  NA 133* 128*  --  130*  K 3.7 4.0  --  3.5  CL 100 97*  --  101  CO2 23 19*  --  18*  GLUCOSE 97 105*  --  138*  BUN 16 16  --  15  CREATININE 1.27* 1.06  --  1.00  CALCIUM 9.1 8.4*  --  8.8*  MG  --  1.3* 2.3  --   PHOS  --  3.7  --   --    GFR: Estimated Creatinine Clearance: 82.2 mL/min (by C-G formula based on SCr of 1 mg/dL). Recent Labs  Lab 10/25/22 0958 10/26/22 0417 10/30/22 1203  WBC 6.8 9.1 9.1    Liver Function Tests: Recent Labs  Lab 10/25/22 0945 10/27/22 0842  AST 95* 99*  ALT 23 19  ALKPHOS 129* 109  BILITOT 1.0 1.5*  PROT 8.0 7.1  ALBUMIN 2.7* 2.5*   Recent Labs  Lab 10/25/22 1332  LIPASE 25   No results for input(s): "AMMONIA" in the last 168 hours.  ABG    Component Value Date/Time   TCO2 24 11/29/2011 0002     Coagulation Profile: Recent Labs  Lab 10/25/22 1100  INR 1.3*    Cardiac Enzymes: No results for input(s): "CKTOTAL", "CKMB", "CKMBINDEX", "TROPONINI" in the last 168 hours.  HbA1C: Hgb A1c MFr Bld  Date/Time Value Ref Range Status  10/25/2022 09:58 AM 5.7 (H) 4.8 - 5.6 % Final    Comment:    (NOTE) Pre diabetes:          5.7%-6.4%  Diabetes:              >6.4%  Glycemic control for   <7.0% adults with diabetes     CBG: No results for input(s): "GLUCAP" in the last 168 hours.  Review of Systems:   Bolds are positive  Constitutional: weight loss, gain, night sweats, Fevers, chills, fatigue .  HEENT: headaches, Sore throat, sneezing, nasal congestion, post nasal drip, Difficulty swallowing, Tooth/dental problems, visual complaints visual changes, ear  ache CV:  chest pain, radiates:,Orthopnea, PND, swelling in lower extremities, dizziness, palpitations, syncope.  GI  heartburn, indigestion, abdominal pain, nausea, vomiting, diarrhea, change in bowel habits, loss of appetite, bloody stools.  Resp: cough, productive: , hemoptysis, dyspnea, chest pain, pleuritic.  Skin: rash or itching or icterus GU: dysuria, change in color of urine, urgency or frequency. flank pain, hematuria  MS: joint pain or swelling. decreased range of motion  Psych: change in mood or affect. depression or anxiety.  Neuro: difficulty with speech, weakness, numbness, ataxia    Past Medical History:  He,  has a past medical history of Hepatitis C, Hypertension, Obstructive sleep apnea, Premature ventricular contraction, and Prostate cancer (HCC) (2006).   Surgical History:   Past Surgical History:  Procedure Laterality Date  LOOP RECORDER INSERTION N/A 07/04/2016   Procedure: Loop Recorder Insertion;  Surgeon: Hillis Range, MD;  Location: [facility] INVASIVE CV LAB;  Service: Cardiovascular;  Laterality: N/A;   prostectomy     SPLENECTOMY, TOTAL       Social History:   reports that he quit smoking about 2 months ago. His smoking use included cigarettes. He has a 40.00 pack-year smoking history. He has been exposed to tobacco smoke. He has never used smokeless tobacco. He reports current alcohol use of about 2.0 standard drinks of alcohol per week. He reports that he does not use drugs.   Family History:  His family history includes Cancer in his brother. There is no history of Heart attack.   Allergies No Known Allergies   Home Medications  Prior to Admission medications   Medication Sig Start Date End Date Taking? Authorizing Provider  carvedilol (COREG) 25 MG tablet Take 1 tablet (25 mg total) by mouth 2 (two) times daily with a meal. 10/29/22  Yes Cloyd Stagers M, PA-C  amLODipine (NORVASC) 10 MG tablet Take 1 tablet (10 mg total) by mouth daily. 10/30/22   Tim Lair, PA-C  atorvastatin (LIPITOR) 80 MG tablet Take 1 tablet (80 mg total) by mouth at bedtime. 10/29/22   Tim Lair, PA-C  escitalopram (LEXAPRO) 10 MG tablet Take 10 mg by mouth daily. 05/27/21   [provider]  isosorbide mononitrate (IMDUR) 60 MG 24 hr tablet Take 1 tablet (60 mg total) by mouth daily. 10/30/22   Tim Lair, PA-C  losartan (COZAAR) 100 MG tablet Take 1 tablet (100 mg total) by mouth daily. 10/30/22   Tim Lair, PA-C  scopolamine (TRANSDERM-SCOP) 1 MG/3DAYS Place 1 patch (1.5 mg total) onto the skin every 3 (three) days. 09/26/22   Pickenpack-Cousar, Arty Baumgartner, NP  spironolactone (ALDACTONE) 50 MG tablet Take 1 tablet (50 mg total) by mouth daily. 10/30/22   Tim Lair, PA-C  valACYclovir (VALTREX) 500 MG tablet Take 1 tablet by mouth daily as needed. For cold sore flare ups 03/09/16   [provider]     Critical care time: 37 minutes     Joneen Roach, AGACNP-BC [facility]  See Amion for personal pager PCCM on call pager [phone] until 7pm. Please call [facility] 7p-7a. [phone]  10/30/2022 9:49 PM

## 2022-10-30 NOTE — ED Provider Notes (Signed)
[place] EMERGENCY DEPARTMENT AT [facility] Provider Note   CSN: [id number] Arrival date & time: 10/30/22  1150     History  Chief Complaint  Patient presents with   Chest Pain    Mathew [name] is a 72 y.o. male.   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  He states that he was treated medically did not undergo any surgery.  He states that he was completely pain-free for the last day of his admission and will went home yesterday without pain.  He states that this morning at 11 AM he began having chest pain that radiates to his back that was quite severe.  He states it feels exactly like it did when he last came to the emergency department.  Endorses nausea with 1 episode of emesis.  Does not feel particularly short of breath.  No hemoptysis.     Home Medications Prior to Admission medications   Medication Sig Start Date End Date Taking? Authorizing Provider  carvedilol (COREG) 25 MG tablet Take 1 tablet (25 mg total) by mouth 2 (two) times daily with a meal. 10/29/22  Yes Cloyd Stagers M, PA-C  amLODipine (NORVASC) 10 MG tablet Take 1 tablet (10 mg total) by mouth daily. 10/30/22   Tim Lair, PA-C  atorvastatin (LIPITOR) 80 MG tablet Take 1 tablet (80 mg total) by mouth at bedtime. 10/29/22   Tim Lair, PA-C  escitalopram (LEXAPRO) 10 MG tablet Take 10 mg by mouth daily. 05/27/21   [provider]  isosorbide mononitrate (IMDUR) 60 MG 24 hr tablet Take 1 tablet (60 mg total) by mouth daily. 10/30/22   Tim Lair, PA-C  losartan (COZAAR) 100 MG tablet Take 1 tablet (100  mg total) by mouth daily. 10/30/22   Tim Lair, PA-C  scopolamine (TRANSDERM-SCOP) 1 MG/3DAYS Place 1 patch (1.5 mg total) onto the skin every 3 (three) days. 09/26/22   Pickenpack-Cousar, Arty Baumgartner, NP  spironolactone (ALDACTONE) 50 MG tablet Take 1 tablet (50 mg total) by mouth daily. 10/30/22   Tim Lair, PA-C  valACYclovir (VALTREX) 500 MG tablet Take 1 tablet by mouth daily as needed. For cold sore flare ups 03/09/16   [provider]      Allergies    Patient has no known allergies.    Review of Systems   Review of Systems  Cardiovascular:  Positive for chest pain.    Physical Exam Updated Vital Signs BP 125/80 (BP Location: Left Arm)   Pulse 79   Temp 97.8 F (36.6 C) (Oral)   Resp (!) 22   Ht 6\' 3"  (1.905 m)   Wt 84.8 kg   SpO2 96%   BMI 23.37 kg/m  Physical Exam Vitals and nursing note reviewed.  Constitutional:      General: He is in acute distress.     Comments: Very uncomfortable 72 year old male able to answer questions appropriately follow commands  HENT:     Head: Normocephalic and atraumatic.     Nose: Nose normal.     Mouth/Throat:     Mouth: Mucous membranes are moist.  Eyes:     General: No scleral icterus. Cardiovascular:  Rate and Rhythm: Normal rate and regular rhythm.     Pulses: Normal pulses.     Heart sounds: Normal heart sounds.     Comments: DP PT pulses symmetric, radial artery pulses symmetric Pulmonary:     Effort: Pulmonary effort is normal. No respiratory distress.     Breath sounds: No wheezing.  Abdominal:     Palpations: Abdomen is soft.     Tenderness: There is no abdominal tenderness. There is no guarding or rebound.  Musculoskeletal:     Cervical back: Normal range of motion.     Right lower leg: No edema.     Left lower leg: No edema.  Skin:    General: Skin is warm and dry.     Capillary Refill: Capillary refill takes less than 2 seconds.  Neurological:     Mental Status: He is alert. Mental  status is at baseline.  Psychiatric:        Mood and Affect: Mood normal.        Behavior: Behavior normal.     ED Results / Procedures / Treatments   Labs (all labs ordered are listed, but only abnormal results are displayed) Labs Reviewed  BASIC METABOLIC PANEL - Abnormal; Notable for the following components:      Result Value   Sodium 130 (*)    CO2 18 (*)    Glucose, Bld 138 (*)    Calcium 8.8 (*)    All other components within normal limits  CBC - Abnormal; Notable for the following components:   RBC 6.43 (*)    Hemoglobin 17.5 (*)    MCV 79.5 (*)    RDW 19.7 (*)    All other components within normal limits  TYPE AND SCREEN  ABO/RH  TROPONIN I (HIGH SENSITIVITY)  TROPONIN I (HIGH SENSITIVITY)    EKG None  Radiology CT Angio Chest/Abd/Pel for Dissection W and/or W/WO  Result Date: 10/30/2022 CLINICAL DATA:  Chest pain. History of aortic dissection. New sudden chest pain radiating to back which started today. EXAM: CT ANGIOGRAPHY CHEST, ABDOMEN AND PELVIS TECHNIQUE: Non-contrast CT of the chest was initially obtained. Multidetector CT imaging through the chest, abdomen and pelvis was performed using the standard protocol during bolus administration of intravenous contrast. Multiplanar reconstructed images and MIPs were obtained and reviewed to evaluate the vascular anatomy. RADIATION DOSE REDUCTION: This exam was performed according to the departmental dose-optimization program which includes automated exposure control, adjustment of the mA and/or kV according to patient size and/or use of iterative reconstruction technique. CONTRAST:  75mL OMNIPAQUE IOHEXOL 350 MG/ML SOLN COMPARISON:  Prior CT scans from 10/25/2022 and 10/28/2022 FINDINGS: CTA CHEST FINDINGS Cardiovascular: Significant progression of aortic dissection with a focal area of active extravasation and significant hematoma in the mediastinum and pericardium new since the prior study. No involvement of the ascending  aorta is identified. The aortic branch vessels are patent. Progressive intramural hematoma compressing the descending thoracic aorta. Mediastinum/Nodes: Mediastinal hematoma. No obvious mass or adenopathy. The esophagus is grossly normal. Lungs/Pleura: Small left pleural effusion. Streaky bibasilar atelectasis. No pulmonary edema. Stable pulmonary nodules. Musculoskeletal: No significant bony findings. Review of the MIP images confirms the above findings. CTA ABDOMEN AND PELVIS FINDINGS VASCULAR Aorta: Intramural hematoma extends down into the abdominal aorta. There is a new small intramural bleed involving the abdominal aorta just below the right renal artery takeoff. Celiac: Small focal dissection noted in the celiac artery but no occlusion. SMA: Widely patent. Renals: The main right and  left renal arteries are normal. There is a small secondary left renal artery. IMA: Patent Inflow: No dissection or aneurysm. Moderate atherosclerotic calcifications. Veins: Patent Review of the MIP images confirms the above findings. NON-VASCULAR Hepatobiliary: Again demonstrated is a large partially necrotic right hepatic lobe mass. Pancreas: No significant findings. Spleen: Status post splenectomy. Adrenals/Urinary Tract: The adrenal glands and kidneys are unremarkable. The bladder is unremarkable. Stomach/Bowel: The stomach, duodenum small and colon are unremarkable. No acute inflammatory process or obstructive findings. Lymphatic: No lymphadenopathy. Reproductive: The prostate gland is normal. The seminal vesicles are normal. Other: No pelvic mass or adenopathy. No free pelvic fluid collections. No inguinal mass or adenopathy. No abdominal wall hernia or subcutaneous lesions. Musculoskeletal: No significant bony findings. Review of the MIP images confirms the above findings. IMPRESSION: 1. Significant progression of aortic dissection with a focal area of new active extravasation just past the left subclavian artery with a  gastric at dimensions pulmonary-matters vascular pulmonary nodules significant hematoma in the mediastinum and pericardium new since the prior study. 2. New small intramural bleed involving the abdominal aorta just below the right renal artery takeoff. 3. Small focal dissection in the celiac artery but no occlusion. 4. Stable large partially necrotic right hepatic lobe mass. 5. Small left pleural effusion and bibasilar atelectasis. These results were called by telephone at the time of interpretation on 10/30/2022 at 3:01 pm to provider Carmell Austria, MD , who verbally acknowledged these results. Electronically Signed   By: Rudie Meyer M.D.   On: 10/30/2022 15:02   DG Chest 2 View  Result Date: 10/30/2022 CLINICAL DATA:  Chest pain, shortness of breath EXAM: CHEST - 2 VIEW COMPARISON:  Previous studies including the CT done on October 29, 2022 and chest radiographs done on 10/25/2022 FINDINGS: Transverse diameter of heart is in the upper limits of normal. Thoracic aorta is ectatic. Increased interstitial markings are seen in both lower lung fields. There is linear density in right parahilar region. There is no pleural effusion or pneumothorax. No new focal pulmonary consolidation is seen. There is no pleural effusion or pneumothorax. IMPRESSION: Increased interstitial markings in both lower lung fields suggest possible scarring. There are no signs of alveolar pulmonary edema or new focal pulmonary consolidation. Electronically Signed   By: Ernie Avena M.D.   On: 10/30/2022 12:30    Procedures .Critical Care  Performed by: Gailen Shelter, PA Authorized by: Gailen Shelter, PA   Critical care provider statement:    Critical care time (minutes):  35   Critical care time was exclusive of:  Separately billable procedures and treating other patients and teaching time   Critical care was necessary to treat or prevent imminent or life-threatening deterioration of the following conditions: TAD.    Critical care was time spent personally by me on the following activities:  Development of treatment plan with patient or surrogate, review of old charts, re-evaluation of patient's condition, pulse oximetry, ordering and review of radiographic studies, ordering and review of laboratory studies, ordering and performing treatments and interventions, obtaining history from patient or surrogate, examination of patient and evaluation of patient's response to treatment   Care discussed with: admitting provider       Medications Ordered in ED Medications  chlorhexidine (PERIDEX) 0.12 % solution (has no administration in time range)  morphine (PF) 4 MG/ML injection 4 mg (4 mg Intravenous Given 10/30/22 1319)  ondansetron (ZOFRAN) injection 4 mg (4 mg Intravenous Given 10/30/22 1320)  iohexol (OMNIPAQUE) 350 MG/ML injection 75 mL (  75 mLs Intravenous Contrast Given 10/30/22 1422)  fentaNYL (SUBLIMAZE) injection 75 mcg (75 mcg Intravenous Given 10/30/22 1533)    ED Course/ Medical Decision Making/ A&P Clinical Course as of 10/30/22 1612  Wed Oct 30, 2022  1501 Discussed with Dr. Myra Gianotti who is now seeing patient currently.  [WF]    Clinical Course User Index [WF] Gailen Shelter, PA                             Medical Decision Making Amount and/or Complexity of Data Reviewed Labs: ordered. Radiology: ordered.  Risk Prescription drug management. Decision regarding hospitalization.   This patient presents to the ED for concern of CP, this involves a number of treatment options, and is a complaint that carries with it a high risk of complications and morbidity. A differential diagnosis was considered for the patient's symptoms which is discussed below:   The emergent causes of chest pain include: Acute coronary syndrome, tamponade, pericarditis/myocarditis, aortic dissection, pulmonary embolism, tension pneumothorax, pneumonia, and esophageal rupture.    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  He states that he was treated medically did not undergo any surgery.  He states that he was completely pain-free for the last day of his admission and will went home yesterday without pain.  He states that this morning at 11 AM he began having chest pain that radiates to his back that was quite severe.  He states it feels exactly like it did when he last came to the emergency department.  Endorses nausea with 1 episode of emesis.  Does not feel particularly short of breath.  No hemoptysis.    EMR reviewed including pt PMHx, past surgical history and past visits to ER.   See HPI for more details   Lab Tests:   I personally reviewed all laboratory work and imaging. Metabolic panel without any acute abnormality specifically kidney function within normal limits and no significant electrolyte abnormalities. CBC without leukocytosis or significant anemia.   Imaging Studies:  Abnormal findings. I personally reviewed all imaging studies. Imaging notable for  IMPRESSION:  1. Significant progression of aortic dissection with a focal area of  new active extravasation just past the left subclavian artery with a  gastric at dimensions pulmonary-matters vascular pulmonary nodules  significant hematoma in the mediastinum and pericardium new since  the prior study.  2. New small intramural bleed involving the abdominal aorta just  below the right renal artery takeoff.  3. Small focal dissection in the celiac artery but no occlusion.  4. Stable large partially necrotic right  hepatic lobe mass.  5. Small left pleural effusion and bibasilar atelectasis.    I personally viewed images from his 2 prior dissection studies.  I reviewed these images with Dr. Myra Gianotti.  It appears that there is new bleeding.  Cardiac Monitoring:  The patient was maintained on a cardiac monitor.  I personally viewed and interpreted the cardiac monitored which showed an underlying rhythm of: NSR EKG non-ischemic   Medicines ordered:  I ordered medication including fentanyl, Zofran, morphine for pain, nausea  Reevaluation of the patient after these medicines showed that the patient improved I have reviewed the patients home medicines and have made adjustments as needed   Critical Interventions:     Consults/Attending Physician   I requested consultation with Dr. Myra Gianotti of vascular surgery,  and discussed lab and imaging findings as well as pertinent plan - they recommend: Operating room.  I discussed this case with my attending physician who cosigned this note including patient's presenting symptoms, physical exam, and planned diagnostics and interventions. Attending physician stated agreement with plan or made changes to plan which were implemented.   Attending physician assessed patient at bedside.   Reevaluation:  After the interventions noted above I re-evaluated patient and found that they have :improved   Social Determinants of Health:      Problem List / ED Course:  Patient is a 72 year old gentleman with previous aortic dissection found today to have new intracranial bleed involving the abdominal aorta just below the renal artery takeoff as well as some extraluminal bleeding around the arch of the aorta.  Dr. Myra Gianotti of vascular surgery will take to operating room.   Dispostion:  After consideration of the diagnostic results and the patients response to treatment, I feel that the patent would benefit from admission   Final Clinical Impression(s) / ED  Diagnoses Final diagnoses:  Aortic arch dissection [facility])    Rx / DC Orders ED Discharge Orders     None         Gailen Shelter, [place] 10/30/22 1616    Benjiman Core, MD 10/30/22 [phone]

## 2022-10-30 NOTE — Anesthesia Procedure Notes (Signed)
Arterial Line Insertion Start/End6/26/2024 4:30 PM, 10/30/2022 4:40 AM Performed by: Mayer Camel, CRNA  Patient location: Pre-op. Preanesthetic checklist: patient identified, IV checked, site marked, risks and benefits discussed, surgical consent, monitors and equipment checked, pre-op evaluation, timeout performed and anesthesia consent Lidocaine 1% used for infiltration Right, radial was placed Catheter size: 20 G Hand hygiene performed , maximum sterile barriers used  and Seldinger technique used Allen's test indicative of satisfactory collateral circulation Attempts: 1 Procedure performed without using ultrasound guided technique. Following insertion, Biopatch and dressing applied. Post procedure assessment: normal  Patient tolerated the procedure well with no immediate complications.

## 2022-10-30 NOTE — H&P (Signed)
[facility]  Patient name: Mathew [name] MRN: [id number] DOB: 1950-11-01 Sex: male   REQUESTING PROVIDER:    ER   REASON FOR CONSULT:    Aortic dissection  HISTORY OF PRESENT ILLNESS:   Mathew [name] is a 72 y.o. male, who presented to the hospital on 10/25/2022 with chest and abdominal pain.  His workup required revealed a type B aortic dissection.  He did not have signs of malperfusion.  He was treated with blood pressure control and pain management.  His follow-up CT scan did not show any significant change.  The patient went home yesterday.  Around 11:00 this morning he stated his chest pain returned and so he came to the hospital.  The patient has a known history of hepatocellular carcinoma.  He has been getting chemotherapy for this.  He suffers from hypertension.  He has a history of prostate cancer.  He has been treated for hepatitis C  PAST MEDICAL HISTORY    Past Medical History:  Diagnosis Date   Hepatitis C    s/p treatment   Hypertension    Obstructive sleep apnea    unable to tolerate CPAP   Premature ventricular contraction    Prostate cancer (HCC) 2006     FAMILY HISTORY   Family History  Problem Relation Age of Onset   Cancer Brother        colon cancer and prostate cancer   Heart attack Neg Hx     SOCIAL HISTORY:   Social History   Socioeconomic History   Marital status: Married    Spouse name: Not on file   Number of children: 3   Years of education: Not on file   Highest education level: Not on file  Occupational History   Not on file  Tobacco Use   Smoking status: Former    Packs/day: 1.00    Years: 40.00    Additional pack years: 0.00    Total pack years: 40.00    Types: Cigarettes    Quit date: 08/05/2022    Years since quitting: 0.2    Passive exposure: Past   Smokeless tobacco: Never   Tobacco comments:    Smokes 1ppd. May not smoke them all, may throw some of them  out.  Never smokes a whole one.  Vaping Use   Vaping Use: Never used  Substance and Sexual Activity   Alcohol use: Yes    Alcohol/week: 2.0 standard drinks of alcohol    Types: 2 Cans of beer per week    Comment: weekends   Drug use: No   Sexual activity: Not on file  Other Topics Concern   Not on file  Social History Narrative   Pt lives in [place] with spouse.  3 children.  Works as a Naval architect.   Social Determinants of Health   Financial Resource Strain: Not on file  Food Insecurity: No Food Insecurity (10/25/2022)   Hunger Vital Sign    Worried About Running Out of Food in the Last Year: Never true    Ran Out of Food in the Last Year: Never true  Transportation Needs: No Transportation Needs (10/25/2022)   PRAPARE - Administrator, Civil Service (Medical): No    Lack of Transportation (Non-Medical): No  Physical Activity: Not on file  Stress: Not on file  Social Connections: Not on file  Intimate Partner Violence: Not At Risk (10/25/2022)   Humiliation, Afraid, Rape, and Kick questionnaire  Fear of Current or Ex-Partner: No    Emotionally Abused: No    Physically Abused: No    Sexually Abused: No    ALLERGIES:    No Known Allergies  CURRENT MEDICATIONS:    Current Facility-Administered Medications  Medication Dose Route Frequency Provider Last Rate Last Admin   chlorhexidine (PERIDEX) 0.12 % solution            midazolam (VERSED) 2 MG/2ML injection             REVIEW OF SYSTEMS:   [X]  denotes positive finding, [ ]  denotes negative finding Cardiac  Comments:  Chest pain or chest pressure: x   Shortness of breath upon exertion:    Short of breath when lying flat:    Irregular heart rhythm:        Vascular    Pain in calf, thigh, or hip brought on by ambulation:    Pain in feet at night that wakes you up from your sleep:     Blood clot in your veins:    Leg swelling:         Pulmonary    Oxygen at home:    Productive cough:      Wheezing:         Neurologic    Sudden weakness in arms or legs:     Sudden numbness in arms or legs:     Sudden onset of difficulty speaking or slurred speech:    Temporary loss of vision in one eye:     Problems with dizziness:         Gastrointestinal    Blood in stool:      Vomited blood:         Genitourinary    Burning when urinating:     Blood in urine:        Psychiatric    Major depression:         Hematologic    Bleeding problems:    Problems with blood clotting too easily:        Skin    Rashes or ulcers:        Constitutional    Fever or chills:     PHYSICAL EXAM:   Vitals:   10/30/22 1230 10/30/22 1315 10/30/22 1502 10/30/22 1640  BP: (!) 159/100  125/80   Pulse: 64 69 79   Resp: (!) 29 (!) 23 (!) 22   Temp:   97.8 F (36.6 C) 97.7 F (36.5 C)  TempSrc:   Oral Oral  SpO2: 94% 98% 96%   Weight:      Height:        GENERAL: The patient is a well-nourished male, in no acute distress. The vital signs are documented above. CARDIAC: There is a regular rate and rhythm.  VASCULAR: Palpable femoral and pedal pulses PULMONARY: Nonlabored respirations ABDOMEN: Soft and non-tender  MUSCULOSKELETAL: There are no major deformities or cyanosis. NEUROLOGIC: No focal weakness or paresthesias are detected. SKIN: There are no ulcers or rashes noted. PSYCHIATRIC: The patient has a normal affect.  STUDIES:   I have reviewed his CT scan with the following findings: 1. Significant progression of aortic dissection with a focal area of new active extravasation just past the left subclavian artery with a gastric at dimensions pulmonary-matters vascular pulmonary nodules significant hematoma in the mediastinum and pericardium new since the prior study. 2. New small intramural bleed involving the abdominal aorta just below the right renal artery takeoff. 3. Small focal  dissection in the celiac artery but no occlusion. 4. Stable large partially necrotic right  hepatic lobe mass. 5. Small left pleural effusion and bibasilar atelectasis.   These results were called by telephone at the time of interpretation on 10/30/2022 at 3:01 pm to provider Carmell Austria, MD , who verbally acknowledged these results.   ASSESSMENT and PLAN   Thoracic aortic dissection: The patient has had severe progression with active extravasation consistent with rupture.  I discussed with the patient and his family at the bedside that he needs to go emergently to the operating room for endovascular repair.  I discussed the gravity of the situation and potential outcomes.  They understand and want to proceed.   Charlena Cross, MD, FACS [facility] of [facility] [phone] Pager [phone]

## 2022-10-30 NOTE — Op Note (Addendum)
Patient name: Mathew [name] MRN: [id number] DOB: 1951/03/21 Sex: male  10/30/2022 Pre-operative Diagnosis: Ruptured type B thoracic aortic dissection Post-operative diagnosis:  Same Surgeon:  Durene Cal Co-Surgeon:  Clotilde Dieter, MD Procedure:   #1: Endovascular repair of ruptured thoracic aortic aneurysm with coverage of left subclavian artery   #2: Ultrasound-guided access, right femoral artery   #3: Ultrasound-guided access, left femoral artery   #4: Ultrasound-guided access, left brachial artery   #5: Stent, left subclavian artery   #6: Laser fenestration, left subclavian artery   #7: Proximal extension   #8: Intravascular ultrasound (IVUS of the ascending aorta, aortic arch, and descending thoracic aorta Anesthesia:  General Blood Loss:  minimal Specimens:  none  Findings: Complete exclusion.  I first deployed a thoracic graft which migrated distally after deployment necessitating a second device that landed at the level of the bovine trunk.  Laser fenestration was performed through both overlapping grafts and a subclavian stent was placed.  No further evidence of bleeding after completion  Devices used: Primary piece was a Cook 34 x 154.  Proximal extension was a Cook 34 x 79  Indications: This is a 72 year old gentleman who presented to the hospital several days ago with abdominal pain and chest pain.  He was found to have a type B aortic dissection/intramural hematoma.  He was managed medically with blood pressure control and pain management.  Follow-up CT scan showed no progression of his dissection so he was sent home.  Earlier today he developed recurrent chest pain and came to the emergency department.  CT scan of the chest showed acute aortic rupture.  I discussed going emergently to the operating room for repair.  Procedure:  The patient was identified in the holding area and taken to [facility] OR ROOM 16  The patient was then placed supine on the table. general anesthesia was  administered.  The patient was prepped and draped in the usual sterile fashion.  A time out was called and antibiotics were administered.  Due to the complexity of the case and emergent nature, a second physician was required.  Dr. Chestine Spore performed ultrasound-guided access of the left brachial artery as well as control of the laser catheter and wire exchanges.  Ultrasound was used to evaluate the right common femoral artery which was widely patent and easily compressible.  A #11 blade was used to make a skin nick.  The right common femoral artery was cannulated under ultrasound guidance with a micropuncture needle.  A [place] wire was inserted followed by placement of a micropuncture sheath.  A Bentson wire was then inserted.  The subcutaneous tract was dilated with an 8 [place] dilator.  Pro-glide devices were deployed at the 11:00 and 1 o'clock position for free closure.  Next, the left femoral artery was evaluated with ultrasound and found to be widely patent.  It was cannulated under ultrasound guidance with a micropuncture needle.  A Ob[place] wire was inserted followed by placement of micropuncture sheath.  A Bentson wire was then placed and a 5 French sheath was inserted.  I was very careful not to advance the wires into the chest to avoid disruption of the dissection.  Next, Dr. Chestine Spore evaluated the left brachial artery.  It was cannulated under ultrasound guidance with a micropuncture needle.  A 7 French sheath was then placed.  A skin nick was made on the skin and the 7 French sheath was then replaced with a 7 [place] Oscor sheath.  Because we now had  3 access sites, I elected to give the patient 4000 units of heparin.  Next, using the pigtail catheter, it was advanced into the acing aorta and a Lunderquist double curve wire was placed.  I then evaluated the ascending aorta and aortic arch and descending thoracic aorta with IVUS.  We took measurements of the artery and located the origin of the great vessels.   There was a fair amount of intramural hematoma visualized in the descending thoracic aorta and focal dissection in the visceral section.  The pigtail catheter was then advanced up the left groin into the ascending aorta.  The main body was prepared on the back table which was a Cook 34 x 154 device.  It was advanced into the descending thoracic aorta.  At this time the ACT measurement came back and was greater than 250 so I did not give any more heparin.  An aortic arch angiogram was performed that showed the origin of the bovine arch as well as the origin of the left subclavian artery.  There was visible extravasation just beyond the left subclavian artery.  The device was then advanced and then deployed at the level of the bovine arch.  Unfortunately after deployment, the device migrated distally.  I repeated the angiogram and this showed that there was still active extravasation and so I felt that an additional device was necessary.  We selected a Cook 34 x 79 device.  This was then deployed proximal to the original piece and landed exactly at the origin of the bovine trunk.  Follow-up imaging showed resolution of the extravasation.  Next we turned the attention towards the laser fenestration.  A 2.5 laser was advanced through the Oscor sheath up to the level of the stent graft in the thoracic aorta.  Once the sheath, laser an 014 [place] core wire were felt to be in good position, we checked this in several oblique projections and then lasered through both the stent grafts.  The [place] core wire was then advanced into the ascending aorta.  Next, a 4 x 2 Mustang balloon was advanced into the fenestration.  I again checked to make sure that it had gone through both layers of fabric and then I performed balloon angioplasty at this level.  At this time I removed the [place] core wire and inserted a versa core wire.  I then advanced a 8 x 29 VBX stent and then deployed the stent with several millimeters extending  into the aorta.  The balloon was taken to 15 atm.  Completion imaging was then performed which showed excellent perfusion of the bovine arch and its branches as well as the left subclavian artery and stent.  There was some narrowing at the origin of the stent which I felt was because there was 2 layers of fabric there.  I elected not to address this because at this point we had seal of the rupture.  Next, I removed the delivery system from the right groin and closed the arteriotomy by securing the previously placed Pro-glide devices.  I then remove the sheath in the left groin and closed this with a ProGlide.  The Oscor sheath in the left brachial artery was removed and exchanged for a 7 French sheath.  A Celt was used to close the brachial artery.  The patient had a palpable left radial pulse and palpable dorsalis pedis pulses.  50 mg of protamine was administered.  Cautery was used on the skin nick in the right groin and  Dermabond was placed on all access sites.  The patient was then successfully extubated taken recovery in stable condition   Disposition: To PACU stable   V. Durene Cal, M.D., [facility] [place] Office: [phone] Pager:  [phone]

## 2022-10-31 ENCOUNTER — Encounter: Payer: Self-pay | Admitting: Hematology

## 2022-10-31 DIAGNOSIS — I71019 Dissection of thoracic aorta, unspecified: Secondary | ICD-10-CM | POA: Diagnosis not present

## 2022-10-31 DIAGNOSIS — E872 Acidosis, unspecified: Secondary | ICD-10-CM | POA: Diagnosis not present

## 2022-10-31 DIAGNOSIS — I7113 Aneurysm of the descending thoracic aorta, ruptured: Secondary | ICD-10-CM | POA: Diagnosis not present

## 2022-10-31 LAB — POCT ACTIVATED CLOTTING TIME
Activated Clotting Time: 263 seconds
Activated Clotting Time: 201 seconds
Activated Clotting Time: 226 seconds

## 2022-10-31 LAB — BASIC METABOLIC PANEL
Anion gap: 9 (ref 5–15)
BUN: 20 mg/dL (ref 8–23)
CO2: 20 mmol/L — ABNORMAL LOW (ref 22–32)
Calcium: 8.6 mg/dL — ABNORMAL LOW (ref 8.9–10.3)
Chloride: 101 mmol/L (ref 98–111)
Creatinine, Ser: 0.93 mg/dL (ref 0.61–1.24)
GFR, Estimated: >60 mL/min (ref >60)
Glucose, Bld: 113 mg/dL — ABNORMAL HIGH (ref 70–99)
Potassium: 4.2 mmol/L (ref 3.5–5.1)
Sodium: 130 mmol/L — ABNORMAL LOW (ref 135–145)

## 2022-10-31 LAB — CBC
HCT: 45.2 % (ref 39.0–52.0)
Hemoglobin: 15.1 g/dL (ref 13.0–17.0)
MCH: 26.8 pg (ref 26.0–34.0)
MCHC: 33.4 g/dL (ref 30.0–36.0)
MCV: 80.3 fL (ref 80.0–100.0)
Platelets: 227 10*3/uL (ref 150–400)
RBC: 5.63 MIL/uL (ref 4.22–5.81)
RDW: 19.1 % — ABNORMAL HIGH (ref 11.5–15.5)
WBC: 14.9 10*3/uL — ABNORMAL HIGH (ref 4.0–10.5)
nRBC: 0 % (ref 0.0–0.2)

## 2022-10-31 LAB — GLUCOSE, CAPILLARY
Glucose-Capillary: 105 mg/dL — ABNORMAL HIGH (ref 70–99)
Glucose-Capillary: 109 mg/dL — ABNORMAL HIGH (ref 70–99)
Glucose-Capillary: 117 mg/dL — ABNORMAL HIGH (ref 70–99)
Glucose-Capillary: 126 mg/dL — ABNORMAL HIGH (ref 70–99)
Glucose-Capillary: 99 mg/dL (ref 70–99)

## 2022-10-31 LAB — MAGNESIUM: Magnesium: 1.9 mg/dL (ref 1.7–2.4)

## 2022-10-31 LAB — PHOSPHORUS: Phosphorus: 4.4 mg/dL (ref 2.5–4.6)

## 2022-10-31 MED ORDER — AMLODIPINE BESYLATE 10 MG PO TABS
10.0000 mg | ORAL_TABLET | Freq: Every day | ORAL | Status: DC
  Administered 2022-10-31: 10 mg via ORAL
  Filled 2022-10-31: qty 1

## 2022-10-31 MED ORDER — CHLORHEXIDINE GLUCONATE CLOTH 2 % EX PADS
6.0000 | MEDICATED_PAD | Freq: Every day | CUTANEOUS | Status: DC
  Administered 2022-10-31 – 2022-11-10 (×12): 6 via TOPICAL

## 2022-10-31 MED ORDER — ISOSORBIDE MONONITRATE ER 30 MG PO TB24
30.0000 mg | ORAL_TABLET | Freq: Every day | ORAL | Status: DC
  Administered 2022-10-31: 30 mg via ORAL
  Filled 2022-10-31: qty 1

## 2022-10-31 MED ORDER — ORAL CARE MOUTH RINSE: 15.0000 mL | OROMUCOSAL | Status: DC | PRN

## 2022-10-31 MED ORDER — LOSARTAN POTASSIUM 50 MG PO TABS
50.0000 mg | ORAL_TABLET | Freq: Every day | ORAL | Status: DC
  Administered 2022-10-31: 50 mg via ORAL
  Filled 2022-10-31: qty 1

## 2022-10-31 NOTE — Consult Note (Signed)
NAME:  Mathew [name], MRN:  [id number], DOB:  06-26-1950, LOS: 1 ADMISSION DATE:  10/30/2022, CONSULTATION DATE:  6/26 REFERRING MD:  Dr. Myra Gianotti , CHIEF COMPLAINT:  Ruptured aortic aneurysm   History of Present Illness:  72 year old male with past medical history as below, which is significant for hepatocellular carcinoma on chemotherapy, paroxysmal atrial fibrillation, hypertension, and hepatitis C status post treatment.  He initially presented to [facility] emergency department on 6/21 with complaints of sudden onset chest pain and was diagnosed with thrombosed type B thoracic aortic dissection extending from the distal aortic arch to the level of the diaphragm.  He was admitted to the ICU for tight hemodynamic control and was ultimately discharged on 6/25 after having no expansion of the dissection.  He now presents again on 6/26 with the recurrence of chest pain.  CT angiogram in the emergency department demonstrated significant progression of the aortic dissection with focal area of active extravasation.  Vascular surgery was consulted and took the patient emergently to the operating room for treatment of aortic rupture with plans for endovascular repair.  PCCM was asked to admit to ICU postoperatively.  The L subclavian was covered with the stenting and surgery was able to provide outlet however states that the pressures in the L to R will still likely vary and the R extremity should be utilized for BP control.    Ccm has been asked to admit for medical management and BP control post operatively.   Pertinent  Medical History   has a past medical history of Hepatitis C, Hypertension, Obstructive sleep apnea, Premature ventricular contraction, and Prostate cancer (HCC) (2006).   Significant Hospital Events: Including procedures, antibiotic start and stop dates in addition to other pertinent events   Discharged with Type B Ao dissection with intramural thrombosis 6/25 Returned for admittance  6/26 with ascending Ao rupture Emergent OR for repair  Interim History / Subjective:  Patient underwent endoscopic repair of ruptured thoracic aortic aneurysm with coverage of left subclavian artery Remain on clevidipine infusion at 20 mg/h Denies chest pain or abdominal pain  Objective   Blood pressure 125/72, pulse (!) 58, temperature 97.6 F (36.4 C), temperature source Oral, resp. rate 12, height 6\' 3"  (1.905 m), weight 91.1 kg, SpO2 94 %.        Intake/Output Summary (Last 24 hours) at 10/31/2022 0851 Last data filed at 10/31/2022 0847 Gross per 24 hour  Intake 1908.86 ml  Output 1025 ml  Net 883.86 ml   Filed Weights   10/30/22 1154 10/31/22 0500  Weight: 84.8 kg 91.1 kg    Examination: Physical exam: General: Elderly male, lying on the bed HEENT: [place]/AT, eyes anicteric.  moist mucus membranes Neuro: Alert, awake following commands Chest: Coarse breath sounds, no wheezes or rhonchi Heart: Regular rate and rhythm, no murmurs or gallops Abdomen: Soft, nontender, nondistended, bowel sounds present Skin: No rash  Labs and images were reviewed  Resolved Hospital Problem list     Assessment & Plan:  Ruptured type B aortic dissection s/p emergent endovascular repair  6/26> L subclavian was covered during surgery. Outlet was able to be provided, but LUE BP expected to be lower than R and vascular surgery suggests we use the right arm for BP measurement.  Use right arm for BP measurement. Currently has art line in place.  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- Patient is on Treatment Plan : LUNG Atezolizumab + Bevacizumab Maintenance q21d  Outpatient follow-up with  oncology   Best Practice (right click and "Reselect all SmartList Selections" daily)   Diet/type: Regular consistency (see orders) > advance as tolerated DVT prophylaxis: prophylactic heparin  GI prophylaxis: N/A Lines: Central line and Arterial Line Foley:  N/A Code Status:  full code Last date of multidisciplinary goals of care discussion [6/27: Patient was updated at bedside, decision was to continue full scope of care]  Labs   CBC: Recent Labs  Lab 10/25/22 0958 10/26/22 0417 10/30/22 1203 10/30/22 2040 10/31/22 0450  WBC 6.8 9.1 9.1 12.9* 14.9*  HGB 17.7* 18.5* 17.5* 15.1 15.1  HCT 54.1* 55.8* 51.1 46.1 45.2  MCV 83.5 81.6 79.5* 81.6 80.3  PLT 206 232 237 199 227    Basic Metabolic Panel: Recent Labs  Lab 10/25/22 0945 10/27/22 0842 10/28/22 2113 10/30/22 1203 10/30/22 2040 10/31/22 0450  NA 133* 128*  --  130*  --  130*  K 3.7 4.0  --  3.5  --  4.2  CL 100 97*  --  101  --  101  CO2 23 19*  --  18*  --  20*  GLUCOSE 97 105*  --  138*  --  113*  BUN 16 16  --  15  --  20  CREATININE 1.27* 1.06  --  1.00 1.05 0.93  CALCIUM 9.1 8.4*  --  8.8*  --  8.6*  MG  --  1.3* 2.3  --   --  1.9  PHOS  --  3.7  --   --   --  4.4   GFR: Estimated Creatinine Clearance: 88.3 mL/min (by C-G formula based on SCr of 0.93 mg/dL). Recent Labs  Lab 10/26/22 0417 10/30/22 1203 10/30/22 2040 10/31/22 0450  WBC 9.1 9.1 12.9* 14.9*    Liver Function Tests: Recent Labs  Lab 10/25/22 0945 10/27/22 0842  AST 95* 99*  ALT 23 19  ALKPHOS 129* 109  BILITOT 1.0 1.5*  PROT 8.0 7.1  ALBUMIN 2.7* 2.5*   Recent Labs  Lab 10/25/22 1332  LIPASE 25   No results for input(s): "AMMONIA" in the last 168 hours.  ABG    Component Value Date/Time   TCO2 24 11/29/2011 0002     Coagulation Profile: Recent Labs  Lab 10/25/22 1100  INR 1.3*    Cardiac Enzymes: No results for input(s): "CKTOTAL", "CKMB", "CKMBINDEX", "TROPONINI" in the last 168 hours.  HbA1C: Hgb A1c  MFr Bld  Date/Time Value Ref Range Status  10/25/2022 09:58 AM 5.7 (H) 4.8 - 5.6 % Final    Comment:    (NOTE) Pre diabetes:          5.7%-6.4%  Diabetes:              >6.4%  Glycemic control for   <7.0% adults with diabetes     CBG: Recent Labs  Lab 10/30/22 2213 10/31/22 0003 10/31/22 0450 10/31/22 0743  GLUCAP 117* 117* 99 126*    The patient is critically ill due to ruptured type B aortic dissection, uncontrolled hypertension.  Critical care was necessary to treat or prevent imminent or life-threatening deterioration.  Critical care was time spent personally by me on the following activities: development of treatment plan with patient and/or surrogate as well as nursing, discussions with consultants, evaluation of patient's response  to treatment, examination of patient, obtaining history from patient or surrogate, ordering and performing treatments and interventions, ordering and review of laboratory studies, ordering and review of radiographic studies, pulse oximetry, re-evaluation of patient's condition and participation in multidisciplinary rounds.   During this encounter critical care time was devoted to patient care services described in this note for 38 minutes.     Cheri Fowler, MD [facility] See Amion for pager If no response to pager, please call [phone] until 7pm After 7pm, Please call E-link [phone]

## 2022-10-31 NOTE — Op Note (Signed)
Date: October 30, 2022  Preoperative diagnosis: Ruptured type B thoracic aortic dissection  Postoperative diagnosis: Same  Surgeon: Dr. Cephus Shelling, MD  Co-surgeon: Dr. Durene Cal, MD  Procedure: #1: Endovascular repair of ruptured thoracic aortic aneurysm with coverage of left subclavian artery #2: Ultrasound-guided access, right femoral artery #3: Ultrasound-guided access, left femoral artery #4: Ultrasound-guided access, left brachial artery #5: Stent, left subclavian artery #6: Laser fenestration, left subclavian artery #7: Proximal extension #8: Intravascular ultrasound (IVUS of the ascending aorta, aortic arch, and descending thoracic aorta)  Indications: 72 year old male who presented to the hospital with chest pain last week found to have a type B aortic dissection with intramural hematoma.  This was managed nonoperatively.  Follow-up CT showed no progression of dissection.  He presented to the ED yesterday with chest pain with findings of acute aortic rupture.  Anesthesia: General  Details: Patient was taken to the OR 16 and general endotracheal anesthesia was induced.  The left arm, chest, abdomen and both groins were prepped and draped in standard sterile fashion.  Antibiotics were given and timeout performed.  Due to the complexity of the case a second surgeon was required.  I accessed the left brachial artery under ultrasound guidance using a micro access needle placed a microwire and then a microsheath.  I then used a Bentson wire and upsized to a 7 [place] sheath.  Ultimately we then upsized to a 7 [place] Oscor steerable sheath to allow laser fenestration of the thoracic stent graft.  Ultimately Dr. Myra Gianotti got bilateral common femoral access as dictated by him.  I was present for all of the procedure including deploying the two thoracic stent graft's for coverage of the aortic thoracic rupture with coverage of the left subclavian artery.  Ultimately we then performed  laser fenestration of the stent graft after was deployed across the left subclavian artery in order to get coverage of the rupture.  Laser fenestration was performed with the [place]on laser.  This was then treated with a 4 mm x 20 mm Mustang balloon and then stented with an 8 mm x 29 mm VBX.  Complication: None  Condition: Stable,transported to the ICU postop  Cephus Shelling, MD [facility] [place] Office: [phone]   Cephus Shelling

## 2022-10-31 NOTE — Anesthesia Postprocedure Evaluation (Signed)
Anesthesia Post Note  Patient: Mathew [name]  Procedure(s) Performed: THORACIC AORTIC ENDOVASCULAR STENT GRAFT with Left Subclavian artery revascularization with laser. (Chest) TRANSESOPHAGEAL ECHOCARDIOGRAM     Patient location during evaluation: PACU Anesthesia Type: General Level of consciousness: sedated and patient cooperative Pain management: pain level controlled Vital Signs Assessment: post-procedure vital signs reviewed and stable Respiratory status: spontaneous breathing Cardiovascular status: stable Anesthetic complications: no   No notable events documented.  Last Vitals:  Vitals:   10/31/22 0730 10/31/22 0748  BP: 130/70   Pulse: 61   Resp: 15   Temp:  36.4 C  SpO2: 96%     Last Pain:  Vitals:   10/31/22 0748  TempSrc: Oral  PainSc:                  Mathew [name]

## 2022-10-31 NOTE — Progress Notes (Signed)
Patient nauseated after breakfast, zofran given. +3 pulses in all extremities, no c/o chest pain.

## 2022-10-31 NOTE — Progress Notes (Signed)
    Subjective  - POD #1 status post endovascular repair of ruptured thoracic aortic dissection  No complaints this morning Blood pressure has been well-controlled overnight Initially complained of some numbness in the left arm while it was immobilized but that is improved   Physical Exam:  Palpable pedal pulses Asymmetric radial pulses, right greater than left Neurologically intact Abdomen soft nontender Both groin sites are soft       Assessment/Plan:  POD #1  -Titrate blood pressure medication for systolic pressure less than 130 -Continue aspirin and statin -Okay to discontinue A-line -Continue to monitor for spinal cord ischemia although no evidence of this currently  Mathew [name] 10/31/2022 7:03 AM --  Vitals:   10/31/22 0620 10/31/22 0630  BP: 129/73 131/74  Pulse: 61 64  Resp: 10 15  Temp:    SpO2: 93% 92%    Intake/Output Summary (Last 24 hours) at 10/31/2022 0703 Last data filed at 10/31/2022 0500 Gross per 24 hour  Intake 1656.36 ml  Output 950 ml  Net 706.36 ml     Laboratory CBC    Component Value Date/Time   WBC 14.9 (H) 10/31/2022 0450   HGB 15.1 10/31/2022 0450   HGB 18.4 (H) 10/23/2022 1417   HCT 45.2 10/31/2022 0450   PLT 227 10/31/2022 0450   PLT 238 10/23/2022 1417    BMET    Component Value Date/Time   NA 130 (L) 10/31/2022 0450   K 4.2 10/31/2022 0450   CL 101 10/31/2022 0450   CO2 20 (L) 10/31/2022 0450   GLUCOSE 113 (H) 10/31/2022 0450   BUN 20 10/31/2022 0450   CREATININE 0.93 10/31/2022 0450   CREATININE 1.25 (H) 10/23/2022 1417   CALCIUM 8.6 (L) 10/31/2022 0450   GFRNONAA >60 10/31/2022 0450   GFRNONAA >60 10/23/2022 1417   GFRAA >60 06/12/2018 0815    COAG Lab Results  Component Value Date   INR 1.3 (H) 10/25/2022   INR 1.0 09/02/2022   INR 1.0 08/30/2022   No results found for: "PTT"  Antibiotics Anti-infectives (From admission, onward)    Start     Dose/Rate Route Frequency Ordered Stop   10/30/22  2200  ceFAZolin (ANCEF) IVPB 2g/100 mL premix        2 g 200 mL/hr over 30 Minutes Intravenous Every 8 hours 10/30/22 2108 10/31/22 0541        V. Charlena Cross, M.D., [facility] [place] Office: [phone] Pager:  [phone]

## 2022-11-01 ENCOUNTER — Inpatient Hospital Stay (HOSPITAL_COMMUNITY): Payer: BC Managed Care – PPO

## 2022-11-01 ENCOUNTER — Inpatient Hospital Stay (HOSPITAL_COMMUNITY): Payer: BC Managed Care – PPO | Admitting: Anesthesiology

## 2022-11-01 ENCOUNTER — Encounter (HOSPITAL_COMMUNITY): Payer: Self-pay | Admitting: Surgery

## 2022-11-01 ENCOUNTER — Other Ambulatory Visit: Payer: BC Managed Care – PPO

## 2022-11-01 DIAGNOSIS — I7113 Aneurysm of the descending thoracic aorta, ruptured: Secondary | ICD-10-CM | POA: Diagnosis not present

## 2022-11-01 DIAGNOSIS — I71019 Dissection of thoracic aorta, unspecified: Secondary | ICD-10-CM | POA: Diagnosis not present

## 2022-11-01 DIAGNOSIS — I639 Cerebral infarction, unspecified: Secondary | ICD-10-CM

## 2022-11-01 DIAGNOSIS — I6310 Cerebral infarction due to embolism of unspecified precerebral artery: Secondary | ICD-10-CM | POA: Diagnosis not present

## 2022-11-01 DIAGNOSIS — I7121 Aneurysm of the ascending aorta, without rupture: Secondary | ICD-10-CM | POA: Diagnosis not present

## 2022-11-01 LAB — PROTIME-INR
INR: 1.3 — ABNORMAL HIGH (ref 0.8–1.2)
Prothrombin Time: 16.2 seconds — ABNORMAL HIGH (ref 11.4–15.2)

## 2022-11-01 LAB — GLUCOSE, CAPILLARY
Glucose-Capillary: 107 mg/dL — ABNORMAL HIGH (ref 70–99)
Glucose-Capillary: 77 mg/dL (ref 70–99)
Glucose-Capillary: 79 mg/dL (ref 70–99)
Glucose-Capillary: 82 mg/dL (ref 70–99)
Glucose-Capillary: 89 mg/dL (ref 70–99)
Glucose-Capillary: 94 mg/dL (ref 70–99)
Glucose-Capillary: 96 mg/dL (ref 70–99)
Glucose-Capillary: 96 mg/dL (ref 70–99)

## 2022-11-01 LAB — APTT: aPTT: 32 seconds (ref 24–36)

## 2022-11-01 LAB — TRIGLYCERIDES: Triglycerides: 245 mg/dL — ABNORMAL HIGH (ref <150)

## 2022-11-01 LAB — FIBRINOGEN: Fibrinogen: 396 mg/dL (ref 210–475)

## 2022-11-01 MED ORDER — IOHEXOL 350 MG/ML SOLN
75.0000 mL | Freq: Once | INTRAVENOUS | Status: AC | PRN
  Administered 2022-11-01: 75 mL via INTRAVENOUS

## 2022-11-01 MED ORDER — ALUM & MAG HYDROXIDE-SIMETH 200-200-20 MG/5ML PO SUSP: 15.0000 mL | ORAL | Status: DC | PRN

## 2022-11-01 MED ORDER — GADOBUTROL 1 MMOL/ML IV SOLN
10.0000 mL | Freq: Once | INTRAVENOUS | Status: AC | PRN
  Administered 2022-11-01: 10 mL via INTRAVENOUS

## 2022-11-01 MED ORDER — DOCUSATE SODIUM 100 MG PO CAPS
100.0000 mg | ORAL_CAPSULE | Freq: Every day | ORAL | Status: DC
  Administered 2022-11-02 – 2022-11-19 (×13): 100 mg via ORAL
  Filled 2022-11-01 (×15): qty 1

## 2022-11-01 MED ORDER — PANTOPRAZOLE SODIUM 40 MG PO TBEC
40.0000 mg | DELAYED_RELEASE_TABLET | Freq: Every day | ORAL | Status: DC
  Administered 2022-11-02 – 2022-11-19 (×18): 40 mg via ORAL
  Filled 2022-11-01 (×18): qty 1

## 2022-11-01 MED ORDER — STROKE: EARLY STAGES OF RECOVERY BOOK
Freq: Once | Status: AC
  Filled 2022-11-01: qty 1

## 2022-11-01 MED ORDER — ONDANSETRON HCL 4 MG/2ML IJ SOLN
4.0000 mg | Freq: Four times a day (QID) | INTRAMUSCULAR | Status: DC | PRN
  Administered 2022-11-03 – 2022-11-15 (×11): 4 mg via INTRAVENOUS
  Filled 2022-11-01 (×11): qty 2

## 2022-11-01 NOTE — Hospital Course (Addendum)
[name] is a 72 year old M with a PMH of hepatocellular carcinoma with likely metastases to the lung, paroxysmal atrial fibrillation, prostate cancer s/p prostatectomy, treated hepatitis C, OSA, HTN, and emphysema.   He initially presented  to the ED with chest pain and EKG concerning for possible ST elevation. He eventually underwent CT C/A/P which revealed thr type B aortic dissection extending from the distal aortic arch just beyond the L subclavian artery to the level of the diaphragm with intramural thrombosis. His repeat scans were stable at that time and he was discharged 6/25 with antihypertensives and pain medications.   The following day 6/26, he noted his chest pain returned and he presented to the hospital. He was found to have severe progression of his dissection with active extravasation consistent with rupture. He was taken to the OR emergently and underwent 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 with coverage of the L subclavian artery and subsequent lasar fenestration at the L subclavian artery.   Patient had no complaints  and his neurological exam was noted to be intact, with no deficits.   It was documented on the chart that somewhere late on POD#1 he was noted to have L sided weakness. Exam noted at that time states that patient also had RLE weakness. There was concern for possible cord ischemia vs CVA. Patient reports that weeks prior to his procedure he had some tiredness in his calves but was able to ambulate without any difficulty. The current weakness that he is experiencing, patient is not quite sure when this started, but he believes this was shortly after his surgery about 2 days ago.   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    Physical Exam   Constitutional: Appears well-developed and well-nourished.  Psych: Affect appropriate to situation Eyes: No scleral injection Head: Normocephalic.  Cardiovascular: Normal rate and regular rhythm.  Respiratory: Effort normal  Skin: WDI  Neuro: Mental Status: Patient is awake, alert, oriented to person, place, month, year, and situation. Patient is able to give a clear and coherent history. No signs of aphasia or neglect Cranial Nerves: II: Visual Fields are full. Pupils are equal, round, and reactive to light.   III,IV, VI: EOMI without ptosis or diplopia.  V: Facial sensation is symmetric to light touch VII: Face is symmetric resting and smiling.  VIII: Hearing is intact to voice X: Palate is midline and palate elevates symmetrically, phonation intact XI: Shoulder shrug is symmetric. XII: tongue is midline without atrophy or fasciculations.  Motor: Tone is normal. Bulk is normal.  Shoulder abduction 4/5 bilaterally Elbow extension 4/5 bilaterally  Elbow flexion 4/5 bilaterally  Hip flexion: R 2/5, L 1/5 Knee extension: 2/5 bilaterally  Knee flexion: 2/5 bilaterally  Dorsiflexion: 4/5 bilaterally  Plantar flexion: 4/5 bilaterally  Hip abduction/adduction 2/5 bilaterally  Weak eversion and inversion of bilateral feet    Sensory: Sensation is symmetric to light touch in the arms and legs. Deep Tendon  Cerebellar: FNF intact bilaterally   Deep Tendon Reflexes: 2+ and symmetric in the biceps and BR 1+ bilaterally in achilles and patellar

## 2022-11-01 NOTE — Progress Notes (Addendum)
Progress Note    11/01/2022 7:56 AM 2 Days Post-Op  Subjective:  L leg weakness over past 12 hours   Vitals:   11/01/22 0630 11/01/22 0730  BP: 116/70 125/73  Pulse: 69 73  Resp: 13 20  Temp:  98.2 F (36.8 C)  SpO2: 91% 90%   Physical Exam: Lungs:  non labored Incisions:  soft groins Extremities:  2+ R radial; 1+ L radial; BLE 2+ DP; minimal motor L leg but sensation intact; R leg also weak but able to SLR Abdomen:  soft, NT, ND Neurologic: A&O; LLE deficit mentioned above  CBC    Component Value Date/Time   WBC 14.9 (H) 10/31/2022 0450   RBC 5.63 10/31/2022 0450   HGB 15.1 10/31/2022 0450   HGB 18.4 (H) 10/23/2022 1417   HCT 45.2 10/31/2022 0450   PLT 227 10/31/2022 0450   PLT 238 10/23/2022 1417   MCV 80.3 10/31/2022 0450   MCH 26.8 10/31/2022 0450   MCHC 33.4 10/31/2022 0450   RDW 19.1 (H) 10/31/2022 0450   LYMPHSABS 3.0 10/23/2022 1417   MONOABS 0.7 10/23/2022 1417   EOSABS 0.1 10/23/2022 1417   BASOSABS 0.2 (H) 10/23/2022 1417    BMET    Component Value Date/Time   NA 130 (L) 10/31/2022 0450   K 4.2 10/31/2022 0450   CL 101 10/31/2022 0450   CO2 20 (L) 10/31/2022 0450   GLUCOSE 113 (H) 10/31/2022 0450   BUN 20 10/31/2022 0450   CREATININE 0.93 10/31/2022 0450   CREATININE 1.25 (H) 10/23/2022 1417   CALCIUM 8.6 (L) 10/31/2022 0450   GFRNONAA >60 10/31/2022 0450   GFRNONAA >60 10/23/2022 1417   GFRAA >60 06/12/2018 0815    INR    Component Value Date/Time   INR 1.3 (H) 10/25/2022 1100     Intake/Output Summary (Last 24 hours) at 11/01/2022 0756 Last data filed at 11/01/2022 0600 Gross per 24 hour  Intake 1206.21 ml  Output 520 ml  Net 686.21 ml     Assessment/Plan:  72 y.o. 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  I have seen and evaluated the patient.  He is postop day #2 from endovascular repair of ruptured thoracic aneurysm.  Apparently he began having some leg weakness yesterday.  We were not notified until this morning when it was discovered on rounds.  He has the inability to lift his left leg off the bed.  He can move his toes and has sensation.  He feels the right leg is not normal either.  He has palpable pedal pulses.  He has palpable radial pulses.  I discussed with the patient and his wife that this is not the typical picture of spinal cord ischemia.  Potentially also could be a stroke from his repair.  I will plan on sending him for stat MRI of the spinal cord and brain to rule out spinal infarct and stroke.  If these are unremarkable, I would place an lumbar drain.  I will also have neurology, evaluate the  patient.  Durene Cal

## 2022-11-01 NOTE — TOC Initial Note (Signed)
Transition of Care [facility]) - Initial/Assessment Note    Patient Details  Name: Mathew [name] MRN: [id number] Date of Birth: 11-22-1950  [facility] CM/SW Contact:    Elliot Cousin, RN Phone Number: [phone] 11/01/2022, 5:06 PM  Clinical Narrative:    Pt was off floor for procedure. Pt's wife was at bedside and states pt was independent PTA. Pt is currently on long term disability.   [employer] CM, [place] [phone] ext [phone], fax [phone]             Expected Discharge Plan: Home/Self Care Barriers to Discharge: Continued Medical Work up   Patient Goals and CMS Choice Patient states their goals for this hospitalization and ongoing recovery are:: wants pt to recover          Expected Discharge Plan and Services   Discharge Planning Services: CM Consult   Living arrangements for the past 2 months: Single Family Home                                      Prior Living Arrangements/Services Living arrangements for the past 2 months: Single Family Home Lives with:: Spouse Patient language and need for interpreter reviewed:: Yes Do you feel safe going back to the place where you live?: Yes      Need for Family Participation in Patient Care: No (Comment) Care giver support system in place?: Yes (comment)   Criminal Activity/Legal Involvement Pertinent to Current Situation/Hospitalization: No - Comment as needed  Activities of Daily Living      Permission Sought/Granted Permission sought to share information with : Case Manager, Family Supports, PCP Permission granted to share information with : Yes, Verbal Permission Granted  Share Information with NAME: Keddrick Sinner     Permission granted to share info w Relationship: wife  Permission granted to share info w Contact Information: [phone]  Emotional Assessment              Admission diagnosis:  Aortic arch dissection (HCC) [I71.011] Acute thoracic aortic dissection  (HCC) [I71.019] Ascending aortic aneurysm, unspecified whether ruptured (HCC) [I71.21] 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) [I71.13] Patient Active Problem List   Diagnosis Date Noted   Acute thoracic aortic dissection (HCC) 10/30/2022   Ascending aortic aneurysm, unspecified whether ruptured (HCC) 10/30/2022   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   Normal anion gap metabolic acidosis 10/30/2022   Malignant hypertension 10/28/2022   AKI (acute kidney injury) (HCC) 10/27/2022   Hypomagnesemia 10/27/2022   Metastatic malignant neoplasm (HCC) 10/26/2022   Dissection of descending thoracic aorta (HCC) 10/25/2022   Hepatocellular carcinoma (HCC) 09/03/2022   Liver mass 08/21/2022   Medication side effect 10/16/2021   IPF (idiopathic pulmonary fibrosis) (HCC) 08/17/2021   Smoker 08/17/2021   PCP:  Patient, No Pcp Per Pharmacy:   [id number] [place], [place] - [address] AT [facility] [address] [place] [address] Phone: [phone] Fax: [phone]  [facility] [address] [place] [address] Phone: [phone] Fax: [phone]     Social Determinants of Health (SDOH) Social History: SDOH Screenings   Food Insecurity: No Food Insecurity (10/25/2022)  Housing: Low Risk  (10/25/2022)  Transportation Needs: No Transportation Needs (10/25/2022)  Utilities: Not At Risk (10/25/2022)  Tobacco Use: Medium Risk (11/01/2022)  SDOH Interventions:     Readmission Risk Interventions     No data to display

## 2022-11-01 NOTE — Progress Notes (Addendum)
This RN assumed care of patient 6/28 at 0730. Per report, pt experiencing L-sided weakness x1 day, first noticed yesterday afternoon post foley catheter removal when nursing attempted to stand pt at bedside. Vascular at bedside during report, notified by nursing of these new findings. Orders to wean Cleviprex at this time; per Vascular, ok to turn off once down to 8mg /hr.  Findings upon initial assessment shows full sensation in all extremities; unilateral L-sided weakness noted. Pt appears lethargic at this time. Stat MRI ordered by Vascular MD Brabham.   Critical result called by radiologist while still in MRI. Number given for Vascular PA Eveland. MD Brabham paged.

## 2022-11-01 NOTE — Anesthesia Procedure Notes (Signed)
Lumbar Drain  Patient location during procedure: holding area Start time: 11/01/2022 8:50 PM End time: 11/01/2022 9:05 PM Staffing Performed: anesthesiologist  Anesthesiologist: Atilano Median, DO Performed by: Atilano Median, DO Authorized by: Atilano Median, DO   Preanesthetic Checklist Completed: patient identified, IV checked, site marked, risks and benefits discussed, monitors and equipment checked, pre-op evaluation and timeout performed Lumbar Puncture:  Patient position: sitting Prep: ChloraPrep Patient monitoring: cardiac monitor, continuous pulse ox and blood pressure Approach: midline Location: L4-5 Injection technique: catheter Needle Needle type: Tuohy  Needle gauge: 14 G Needle length: 9 cm Catheter type: closed end flexible Catheter size: 20 g Catheter at skin depth: 15 cm Assessment Events: cerebrospinal fluid Attempts: 1 CSF: clear Post Procedure: sterile dressing applied, drain attached to lumbar drainage system, lumbar pressure transduced and site cleaned  Additional Notes - lumbar drain placed at surgeon request. Sterile technique throughout. Single attempt at L4/L5 interspace with brisk clear CSF. Catheter threaded smoothly. Stylet removed without resistance. CSF flow confirmed with syringe withdrawal. Sterile dressing applied.

## 2022-11-01 NOTE — Progress Notes (Signed)
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  Dr. Amada Jupiter with neurology has been of great help in management of his leg weakness.  It is uncertain if the embolic strokes completely explain his leg weakness.  There is no obvious evidence of spinal cord ischemia however given that he underwent a thoracic aneurysm repair I think this has to be in the differential.  For that reason we will go ahead and place a lumbar drain and keep his pressure elevated.  Fortunately it does appear that he is getting some movement back in his left leg.  Hopefully this will continue to improve.  The patient has been extensively updated at the bedside.  Mathew [name]

## 2022-11-01 NOTE — Consult Note (Addendum)
NEUROLOGY CONSULTATION NOTE   Date of service: November 01, 2022 Patient Name: Mathew [name] MRN:  [id number] DOB:  [birth year] Reason for consult: "LLE weakness" _ _ _   _ __   _ __ _ _  __ __   _ __   __ _  History of Present Illness  [name] is a 72 year old M with a PMH of hepatocellular carcinoma with likely metastases to the lung, paroxysmal atrial fibrillation, prostate cancer s/p prostatectomy, treated hepatitis C, OSA, HTN, and emphysema.   He initially presented  to the ED with chest pain and EKG concerning for possible ST elevation. He eventually underwent CT C/A/P which revealed thr type B aortic dissection extending from the distal aortic arch just beyond the L subclavian artery to the level of the diaphragm with intramural thrombosis. His repeat scans were stable at that time and he was discharged 6/25 with antihypertensives and pain medications.   The following day 6/26, he noted his chest pain returned and he presented to the hospital. He was found to have severe progression of his dissection with active extravasation consistent with rupture. He was taken to the OR emergently and underwent 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 with coverage of the L subclavian artery and subsequent lasar fenestration at the L subclavian artery.   Patient had no complaints  and his neurological exam was noted to be intact, with no deficits.   It was documented on the chart that somewhere late on POD#1 he was noted to have L sided weakness. Exam noted at that time states that patient also had RLE weakness. There was concern for possible cord ischemia vs CVA. Patient reports that weeks prior to his procedure he had some tiredness in his calves but was able to ambulate without any difficulty. The current weakness that he is experiencing, patient is not quite sure when this started, but he believes this was shortly after his surgery about 2 days ago.   Stat MRI brain showed no  evidence of intracranial bleed, but he was noted to have punctate acute infarcts in the R cerebellum, splenium of the corpus callosum on the L, R occipital lobe, R parietal lobe, and L lentiform nucleus.     LKW: prior to surgery TNK?: no, recent major surgery ROS   Constitutional Denies weight loss, fever and chills.   HEENT Denies changes in vision and hearing.   Respiratory Denies SOB and cough.   CV Denies palpitations and CP   GI Denies abdominal pain, nausea, vomiting and diarrhea.   GU Denies dysuria and urinary frequency.   MSK Denies myalgia and joint pain.   Skin Denies rash and pruritus.   Neurological Denies headache and syncope.   Psychiatric Denies recent changes in mood. Denies anxiety and depression.    Past History   Past Medical History:  Diagnosis Date   Hepatitis C    s/p treatment   Hypertension    Obstructive sleep apnea    unable to tolerate CPAP   Premature ventricular contraction    Prostate cancer (HCC) 2006   Past Surgical History:  Procedure Laterality Date   LOOP RECORDER INSERTION N/A 07/04/2016   Procedure: Loop Recorder Insertion;  Surgeon: Hillis Range, MD;  Location: [facility];  Service: Cardiovascular;  Laterality: N/A;   prostectomy     SPLENECTOMY, TOTAL     Family History  Problem Relation Age of Onset   Cancer Brother  colon cancer and prostate cancer   Heart attack Neg Hx    Social History   Socioeconomic History   Marital status: Married    Spouse name: Not on file   Number of children: 3   Years of education: Not on file   Highest education level: Not on file  Occupational History   Not on file  Tobacco Use   Smoking status: Former    Packs/day: 1.00    Years: 40.00    Additional pack years: 0.00    Total pack years: 40.00    Types: Cigarettes    Quit date: 08/05/2022    Years since quitting: 0.2    Passive exposure: Past   Smokeless tobacco: Never   Tobacco comments:    Smokes 1ppd. May not smoke  them all, may throw some of them out.  Never smokes a whole one.  Vaping Use   Vaping Use: Never used  Substance and Sexual Activity   Alcohol use: Yes    Alcohol/week: 2.0 standard drinks of alcohol    Types: 2 Cans of beer per week    Comment: weekends   Drug use: No   Sexual activity: Not on file  Other Topics Concern   Not on file  Social History Narrative   Pt lives in [place] with spouse.  3 children.  Works as a [employer].   Social Determinants of Health   Financial Resource Strain: Not on file  Food Insecurity: No Food Insecurity (10/25/2022)   Hunger Vital Sign    Worried About Running Out of Food in the Last Year: Never true    Ran Out of Food in the Last Year: Never true  Transportation Needs: No Transportation Needs (10/25/2022)   PRAPARE - Administrator, Civil Service (Medical): No    Lack of Transportation (Non-Medical): No  Physical Activity: Not on file  Stress: Not on file  Social Connections: Not on file   No Known Allergies  Medications   Medications Prior to Admission  Medication Sig Dispense Refill Last Dose   amLODipine (NORVASC) 10 MG tablet Take 1 tablet (10 mg total) by mouth daily. 30 tablet 2 10/25/2022   atorvastatin (LIPITOR) 80 MG tablet Take 1 tablet (80 mg total) by mouth at bedtime. 30 tablet 2 10/25/2022   carvedilol (COREG) 25 MG tablet Take 1 tablet (25 mg total) by mouth 2 (two) times daily with a meal. 60 tablet 2 10/25/2022 at 1000   escitalopram (LEXAPRO) 10 MG tablet Take 10 mg by mouth daily.   10/25/2022   isosorbide mononitrate (IMDUR) 60 MG 24 hr tablet Take 1 tablet (60 mg total) by mouth daily. 30 tablet 2 10/25/2022   losartan (COZAAR) 100 MG tablet Take 1 tablet (100 mg total) by mouth daily. 30 tablet 2 10/25/2022   scopolamine (TRANSDERM-SCOP) 1 MG/3DAYS Place 1 patch (1.5 mg total) onto the skin every 3 (three) days. 10 patch 12 Past Week   spironolactone (ALDACTONE) 50 MG tablet Take 1 tablet (50 mg total) by  mouth daily. 30 tablet 2 10/25/2022   valACYclovir (VALTREX) 500 MG tablet Take 1 tablet by mouth daily as needed. For cold sore flare ups   Past Month     Vitals   Vitals:   11/01/22 0800 11/01/22 1100 11/01/22 1200 11/01/22 1215  BP: 125/76 118/80 133/88 109/73  Pulse: 84 93  86  Resp: 11 13  15   Temp:  97.9 F (36.6 C)    TempSrc:  Oral    SpO2: 94% 91%  97%  Weight:      Height:         Body mass index is 26.34 kg/m.  Physical Exam   Constitutional: Appears well-developed and well-nourished.  Psych: Affect appropriate to situation Eyes: No scleral injection Head: Normocephalic.  Cardiovascular: Normal rate and regular rhythm.  Respiratory: Effort normal  Skin: WDI  Neurologic Examination   Neuro: Mental Status: Patient is awake, alert, oriented to person, place, month, year, and situation. Patient is able to give a clear and coherent history. No signs of aphasia or neglect Cranial Nerves: II: Visual Fields are full. Pupils are equal, round, and reactive to light.   III,IV, VI: EOMI without ptosis or diplopia.  V: Facial sensation is symmetric to light touch VII: Face is symmetric resting and smiling.  VIII: Hearing is intact to voice X: Palate is midline and palate elevates symmetrically, phonation intact XI: Shoulder shrug is symmetric. XII: tongue is midline without atrophy or fasciculations.  Motor: Tone is normal. Bulk is normal.  Shoulder abduction 5/5 throughout Elbow extension 4/5 on left, 5/5 on right Elbow flexion 5/5 bilaterally  He has good strength in the right leg, 5/5 Knee extension: 2/5 on the left Knee flexion: 2/5 on the left Dorsiflexion: 4/5 on the  Plantar flexion: 4/5 on the left Hip abduction/adduction 2/5 on the left Weak eversion and inversion of left foot    Sensory: Sensation is symmetric to light touch in the arms and legs.  It is decreased to temperature in the right leg   Cerebellar: FNF intact bilaterally  Deep  Tendon Reflexes: 2+ and symmetric in the biceps and BR 1+ bilaterally in achilles and patellar   Labs   CBC:  Recent Labs  Lab 10/30/22 2040 10/31/22 0450  WBC 12.9* 14.9*  HGB 15.1 15.1  HCT 46.1 45.2  MCV 81.6 80.3  PLT 199 227    Basic Metabolic Panel:  Lab Results  Component Value Date   NA 130 (L) 10/31/2022   K 4.2 10/31/2022   CO2 20 (L) 10/31/2022   GLUCOSE 113 (H) 10/31/2022   BUN 20 10/31/2022   CREATININE 0.93 10/31/2022   CALCIUM 8.6 (L) 10/31/2022   GFRNONAA >60 10/31/2022   GFRAA >60 06/12/2018   Lipid Panel:  Lab Results  Component Value Date   LDLCALC 263 (H) 10/25/2022   HgbA1c:  Lab Results  Component Value Date   HGBA1C 5.7 (H) 10/25/2022   Urine Drug Screen: No results found for: "LABOPIA", "COCAINSCRNUR", "LABBENZ", "AMPHETMU", "THCU", "LABBARB"  Alcohol Level No results found for: "ETH"  DG Chest Port 1 View  Result Date: 10/30/2022 CLINICAL DATA:  Status post thoracic aortic aneurysm repair EXAM: PORTABLE CHEST 1 VIEW COMPARISON:  10/30/2022 FINDINGS: Single frontal view of the chest demonstrates interval placement of endoluminal stent graft within the aortic arch and descending thoracic aorta. Stable mediastinal and periaortic hematoma as seen on previous CT. Cardiac silhouette is stable. Increased interstitial and ground-glass opacities throughout the lungs could reflect an element of edema. No effusion or pneumothorax. Right internal jugular catheter tip overlies the superior vena cava. IMPRESSION: 1. Interval endoluminal stent graft repair within the distal aortic arch and descending thoracic aorta. Stable mediastinal and periaortic hematoma. 2. Mild pulmonary edema. 3. No complication after right internal jugular catheter placement. Electronically Signed   By: Sharlet Salina M.D.   On: 10/30/2022 21:44   PERIPHERAL VASCULAR CATHETERIZATION  Result Date: 10/30/2022 See surgical note for result.  EP STUDY  Result Date: 10/30/2022 See  surgical note for result.  HYBRID OR IMAGING ([facility] ONLY)  Result Date: 10/30/2022 There is no interpretation for this exam.  This order is for images obtained during a surgical procedure.  Please See "Surgeries" Tab for more information regarding the procedure.   CT Angio Chest/Abd/Pel for Dissection W and/or W/WO  Result Date: 10/30/2022 CLINICAL DATA:  Chest pain. History of aortic dissection. New sudden chest pain radiating to back which started today. EXAM: CT ANGIOGRAPHY CHEST, ABDOMEN AND PELVIS TECHNIQUE: Non-contrast CT of the chest was initially obtained. Multidetector CT imaging through the chest, abdomen and pelvis was performed using the standard protocol during bolus administration of intravenous contrast. Multiplanar reconstructed images and MIPs were obtained and reviewed to evaluate the vascular anatomy. RADIATION DOSE REDUCTION: This exam was performed according to the departmental dose-optimization program which includes automated exposure control, adjustment of the mA and/or kV according to patient size and/or use of iterative reconstruction technique. CONTRAST:  75mL OMNIPAQUE IOHEXOL 350 MG/ML SOLN COMPARISON:  Prior CT scans from 10/25/2022 and 10/28/2022 FINDINGS: CTA CHEST FINDINGS Cardiovascular: Significant progression of aortic dissection with a focal area of active extravasation and significant hematoma in the mediastinum and pericardium new since the prior study. No involvement of the ascending aorta is identified. The aortic branch vessels are patent. Progressive intramural hematoma compressing the descending thoracic aorta. Mediastinum/Nodes: Mediastinal hematoma. No obvious mass or adenopathy. The esophagus is grossly normal. Lungs/Pleura: Small left pleural effusion. Streaky bibasilar atelectasis. No pulmonary edema. Stable pulmonary nodules. Musculoskeletal: No significant bony findings. Review of the MIP images confirms the above findings. CTA ABDOMEN AND PELVIS FINDINGS  VASCULAR Aorta: Intramural hematoma extends down into the abdominal aorta. There is a new small intramural bleed involving the abdominal aorta just below the right renal artery takeoff. Celiac: Small focal dissection noted in the celiac artery but no occlusion. SMA: Widely patent. Renals: The main right and left renal arteries are normal. There is a small secondary left renal artery. IMA: Patent Inflow: No dissection or aneurysm. Moderate atherosclerotic calcifications. Veins: Patent Review of the MIP images confirms the above findings. NON-VASCULAR Hepatobiliary: Again demonstrated is a large partially necrotic right hepatic lobe mass. Pancreas: No significant findings. Spleen: Status post splenectomy. Adrenals/Urinary Tract: The adrenal glands and kidneys are unremarkable. The bladder is unremarkable. Stomach/Bowel: The stomach, duodenum small and colon are unremarkable. No acute inflammatory process or obstructive findings. Lymphatic: No lymphadenopathy. Reproductive: The prostate gland is normal. The seminal vesicles are normal. Other: No pelvic mass or adenopathy. No free pelvic fluid collections. No inguinal mass or adenopathy. No abdominal wall hernia or subcutaneous lesions. Musculoskeletal: No significant bony findings. Review of the MIP images confirms the above findings. IMPRESSION: 1. Significant progression of aortic dissection with a focal area of new active extravasation just past the left subclavian artery with a gastric at dimensions pulmonary-matters vascular pulmonary nodules significant hematoma in the mediastinum and pericardium new since the prior study. 2. New small intramural bleed involving the abdominal aorta just below the right renal artery takeoff. 3. Small focal dissection in the celiac artery but no occlusion. 4. Stable large partially necrotic right hepatic lobe mass. 5. Small left pleural effusion and bibasilar atelectasis. These results were called by telephone at the time of  interpretation on 10/30/2022 at 3:01 pm to provider Carmell Austria, MD , who verbally acknowledged these results. Electronically Signed   By: Rudie Meyer M.D.   On: 10/30/2022 15:02   DG Chest  2 View  Result Date: 10/30/2022 CLINICAL DATA:  Chest pain, shortness of breath EXAM: CHEST - 2 VIEW COMPARISON:  Previous studies including the CT done on November 01, 2022 and chest radiographs done on 10/25/2022 FINDINGS: Transverse diameter of heart is in the upper limits of normal. Thoracic aorta is ectatic. Increased interstitial markings are seen in both lower lung fields. There is linear density in right parahilar region. There is no pleural effusion or pneumothorax. No new focal pulmonary consolidation is seen. There is no pleural effusion or pneumothorax. IMPRESSION: Increased interstitial markings in both lower lung fields suggest possible scarring. There are no signs of alveolar pulmonary edema or new focal pulmonary consolidation. Electronically Signed   By: Ernie Avena M.D.   On: 10/30/2022 12:30     Impression   Mr. 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 And MRI of the c spine showed cord ischemia on the R side.   Recommendations  -Permissive hypertension, as able given recent aortic dissection and repair -Continue telemetry  -Hold on repeating surface echo with recent TEE - continue ASA  Marolyn Haller, MD PGY-3 Internal Medicine Resident  ____________________________________________________________________  I have seen the patient reviewed the above notes.  He has several embolic appearing strokes on MRI, but none of these appear to  adequately explain his left leg weakness.  He does have a cervical spine hyperintensity on T2 as well which my suspicion is does represent an area of ischemia based on his exam of leg weakness with contralateral temperature sensation deficit.  This would fit with an anterior spinal artery territory, but interestingly the signal change seen on MRI is more on the right than the left which would be opposite of what I would expect based on his exam.  I do suspect that his leg weakness is likely secondary to spinal cord ischemia, though whether embolic similar to his cerebral infarcts or due to the procedure I am not sure.  Stroke team to follow.  Thank you for the opportunity to take part in the care of this patient. If you have any further questions, please contact the neurology consultation attending.  Signed,  Ritta Slot, MD [facility] [phone]  If 7pm- 7am, please page neurology on call as listed in AMION.  I have seen the patient and reviewed the above note.

## 2022-11-01 NOTE — Anesthesia Preprocedure Evaluation (Signed)
Anesthesia Evaluation  Patient identified by MRN, date of birth, ID band Patient awake    Reviewed: Allergy & Precautions, NPO status , Patient's Chart, lab work & pertinent test results, reviewed documented beta blocker date and time   History of Anesthesia Complications Negative for: history of anesthetic complications  Airway Mallampati: II  TM Distance: >3 FB Neck ROM: Full    Dental  (+) Dental Advisory Given, Teeth Intact   Pulmonary sleep apnea , former smoker Idiopathic pulm fibrosis    Pulmonary exam normal        Cardiovascular hypertension, Pt. on medications and Pt. on home beta blockers Normal cardiovascular exam+ dysrhythmias Atrial Fibrillation    Thoracic aortic dissection, now ruptured/contained s/p stent graft  '24 TTE - EF 55%. There is moderate asymmetric left ventricular hypertrophy of the septal segment. Grade I diastolic dysfunction (impaired relaxation). Trivial mitral valve  regurgitation.     Neuro/Psych negative neurological ROS  negative psych ROS   GI/Hepatic ,GERD  Medicated and Controlled,,(+) Hepatitis -, C Metastatic HCC    Endo/Other    Renal/GU  Hx b/l RCC    Prostate cancer     Musculoskeletal negative musculoskeletal ROS (+)    Abdominal Normal abdominal exam  (+)   Peds  Hematology Lab Results      Component                Value               Date                      WBC                      14.9 (H)            10/31/2022                HGB                      15.1                10/31/2022                HCT                      45.2                10/31/2022                MCV                      80.3                10/31/2022                PLT                      227                 10/31/2022             Lab Results      Component                Value               Date  NA                       130 (L)             10/31/2022                 K                        4.2                 10/31/2022                CO2                      20 (L)              10/31/2022                GLUCOSE                  113 (H)             10/31/2022                BUN                      20                  10/31/2022                CREATININE               0.93                10/31/2022                CALCIUM                  8.6 (L)             10/31/2022                GFR                      90.90               10/16/2021                GFRNONAA                 >60                 10/31/2022             Lab Results      Component                Value               Date                      INR                      1.3 (H)             11/01/2022                INR                      1.3 (H)  10/25/2022                INR                      1.0                 09/02/2022              Anesthesia Other Findings CTA Chest - IMPRESSION: 1. Significant progression of aortic dissection with a focal area of new active extravasation just past the left subclavian artery with a gastric at dimensions pulmonary-matters vascular pulmonary nodules significant hematoma in the mediastinum and pericardium new since the prior study.  2. New small intramural bleed involving the abdominal aorta just below the right renal artery takeoff. 3. Small focal dissection in the celiac artery but no occlusion. 4. Stable large partially necrotic right hepatic lobe mass. 5. Small left pleural effusion and bibasilar atelectasis.   Reproductive/Obstetrics                             Anesthesia Physical Anesthesia Plan  ASA: 4  Anesthesia Plan:    Post-op Pain Management:    Induction:   PONV Risk Score and Plan: 1 and Treatment may vary due to age or medical condition  Airway Management Planned: Natural Airway  Additional Equipment: Spinal Drain  Intra-op Plan:   Post-operative Plan:   Informed Consent: I  have reviewed the patients History and Physical, chart, labs and discussed the procedure including the risks, benefits and alternatives for the proposed anesthesia with the patient or authorized representative who has indicated his/her understanding and acceptance.     Dental advisory given  Plan Discussed with:   Anesthesia Plan Comments: (- lumbar drain to be placed at surgeon request. Coags drawn pre-procedure and WNL to proceed with placement. )        Anesthesia Quick Evaluation

## 2022-11-01 NOTE — Progress Notes (Signed)
NAME:  Mathew [name], MRN:  [id number], DOB:  June 30, 1950, LOS: 2 ADMISSION DATE:  10/30/2022, CONSULTATION DATE:  6/26 REFERRING MD:  Dr. Myra Gianotti , CHIEF COMPLAINT:  Ruptured aortic aneurysm   History of Present Illness:  72 year old male with past medical history as below, which is significant for hepatocellular carcinoma on chemotherapy, paroxysmal atrial fibrillation, hypertension, and hepatitis C status post treatment.  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  He was admitted to the ICU for tight hemodynamic control and was ultimately discharged on 6/25 after having no expansion of the dissection.  He now presents again on 6/26 with the recurrence of chest pain.  CT angiogram in the emergency department demonstrated significant progression of the aortic dissection with focal area of active extravasation.  Vascular surgery was consulted and took the patient emergently to the operating room for treatment of aortic rupture with plans for endovascular repair.  PCCM was asked to admit to ICU postoperatively.  The L subclavian was covered with the stenting and surgery was able to provide outlet however states that the pressures in the L to R will still likely vary and the R extremity should be utilized for BP control.    Ccm has been asked to admit for medical management and BP control post operatively.   Pertinent  Medical History   has a past medical history of Hepatitis C, Hypertension, Obstructive sleep apnea, Premature ventricular contraction, and Prostate cancer (HCC) (2006).   Significant Hospital Events: Including procedures, antibiotic start and stop dates in addition to other pertinent events   Discharged with Type B Ao dissection with intramural thrombosis 6/25 Returned for admittance  6/26 with ascending Ao rupture Emergent OR for repair  Interim History / Subjective:  Patient developed left-sided weakness especially in left lower extremity compared to left upper since last night Blood pressure remained stable Clevidipine infusion was titrated off  Objective   Blood pressure 118/80, pulse 93, temperature 97.9 F (36.6 C), temperature source Oral, resp. rate 13, height 6\' 3"  (1.905 m), weight 95.6 kg, SpO2 91 %.        Intake/Output Summary (Last 24 hours) at 11/01/2022 1117 Last data filed at 11/01/2022 0800 Gross per 24 hour  Intake 926.27 ml  Output 375 ml  Net 551.27 ml   Filed Weights   10/30/22 1154 10/31/22 0500 11/01/22 0600  Weight: 84.8 kg 91.1 kg 95.6 kg    Examination: Physical exam: General: Elderly male, lying on the bed HEENT: [place]/AT, eyes anicteric.  moist mucus membranes Neuro: Alert, awake following commands.  Left upper extremity is antigravity, left lower extremity 1/5.  Right side is 5/5 Chest: Coarse breath sounds, no wheezes or rhonchi Heart: Regular rate and rhythm, no murmurs or gallops Abdomen: Soft, nontender, nondistended, bowel sounds present Skin: No rash  Labs and images were reviewed  Resolved Hospital Problem list     Assessment & Plan:  Ruptured type B aortic dissection s/p emergent endovascular repair  6/26> L subclavian was covered during surgery. Outlet was able to be provided, but LUE BP expected to be lower than R and vascular surgery suggests we use the right arm for BP measurement.  Use right arm for BP measurement. Currently has art line in place.  Cleviprex was titrated off this morning Continue oral antihypertensive meds, titrate clevidipine  infusion Appreciate vascular surgery follow-up  Acute bilateral multifocal embolic ischemic stroke Cervical spinal cord edema/infarct Patient started with left-sided weakness overnight, stroke code was not called This morning upon evaluation he had severe left-sided  weakness especially left lower extremity He was sent for stat brain and brain MRI which showed bilateral multifocal embolic ischemic stroke involving right cerebellar splenium of the corpus callosum on the left, right occipital lobe, right parietal lobe, and left lentiform nucleus Also cervical spine MRI showing spinal cord signal in the C6-C7, could be due to edema versus stroke Neurology is consulted  Hyponatremia NAG acidosis Closely monitor BMP Serum sodium remained at 130 Serum bicarbonate is improving to 20  Hypertension HLD Continue oral antihypertensive meds Continue atorvastatin  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- Patient is on Treatment Plan : LUNG Atezolizumab + Bevacizumab Maintenance q21d  Outpatient follow-up with oncology   Best Practice (right click and "Reselect all SmartList Selections" daily)   Diet/type: Regular consistency (see orders) > advance as tolerated DVT prophylaxis: prophylactic heparin  GI prophylaxis: N/A Lines: Central line and Arterial Line Foley:  N/A Code Status:  full code Last date of multidisciplinary goals of care discussion [6/27: Patient was updated at bedside, decision was to continue full scope of care]  Labs   CBC: Recent Labs  Lab 10/26/22 0417 10/30/22 1203 10/30/22 2040 10/31/22 0450  WBC 9.1 9.1 12.9* 14.9*  HGB 18.5* 17.5* 15.1 15.1  HCT 55.8* 51.1 46.1 45.2  MCV 81.6 79.5* 81.6 80.3  PLT 232 237 199 227    Basic Metabolic Panel: Recent Labs  Lab 10/27/22 0842 10/28/22 2113 10/30/22 1203 10/30/22 2040 10/31/22 0450  NA 128*  --  130*  --  130*  K 4.0  --  3.5  --  4.2  CL 97*  --  101  --  101  CO2 19*  --  18*  --  20*  GLUCOSE 105*  --  138*  --  113*  BUN 16  --  15  --  20  CREATININE 1.06  --  1.00 1.05 0.93  CALCIUM 8.4*  --  8.8*  --  8.6*  MG 1.3* 2.3  --   --  1.9  PHOS 3.7  --   --   --  4.4   GFR: Estimated Creatinine Clearance: 88.3 mL/min (by C-G formula based  on SCr of 0.93 mg/dL). Recent Labs  Lab 10/26/22 0417 10/30/22 1203 10/30/22 2040 10/31/22 0450  WBC 9.1 9.1 12.9* 14.9*    Liver Function Tests: Recent Labs  Lab 10/27/22 0842  AST 99*  ALT 19  ALKPHOS 109  BILITOT 1.5*  PROT 7.1  ALBUMIN 2.5*   Recent Labs  Lab 10/25/22 1332  LIPASE 25   No results for input(s): "AMMONIA" in the last 168 hours.  ABG    Component Value Date/Time   TCO2 24 11/29/2011 0002     Coagulation Profile: No results for input(s): "INR", "PROTIME" in the last 168 hours.   Cardiac Enzymes: No results for input(s): "CKTOTAL", "CKMB", "CKMBINDEX", "TROPONINI" in the last 168 hours.  HbA1C: Hgb A1c MFr Bld  Date/Time Value Ref Range Status  10/25/2022 09:58 AM 5.7 (H) 4.8 - 5.6 % Final    Comment:    (NOTE) Pre diabetes:          5.7%-6.4%  Diabetes:              >6.4%  Glycemic control for   <7.0% adults  with diabetes     CBG: Recent Labs  Lab 10/31/22 1206 10/31/22 1551 10/31/22 1952 10/31/22 2356 11/01/22 0501  GLUCAP 109* 105* 107* 96 96    The patient is critically ill due to ruptured type B aortic dissection, uncontrolled hypertension, acute bilateral multifocal strokes.  Critical care was necessary to treat or prevent imminent or life-threatening deterioration.  Critical care was time spent personally by me on the following activities: development of treatment plan with patient and/or surrogate as well as nursing, discussions with consultants, evaluation of patient's response to treatment, examination of patient, obtaining history from patient or surrogate, ordering and performing treatments and interventions, ordering and review of laboratory studies, ordering and review of radiographic studies, pulse oximetry, re-evaluation of patient's condition and participation in multidisciplinary rounds.   During this encounter critical care time was devoted to patient care services described in this note for 32 minutes.      Cheri Fowler, MD [facility] [facility] See Amion for pager If no response to pager, please call [phone] until 7pm After 7pm, Please call E-link [phone]

## 2022-11-02 ENCOUNTER — Encounter (HOSPITAL_COMMUNITY): Payer: Self-pay | Admitting: Anesthesiology

## 2022-11-02 DIAGNOSIS — I7113 Aneurysm of the descending thoracic aorta, ruptured: Secondary | ICD-10-CM | POA: Diagnosis not present

## 2022-11-02 DIAGNOSIS — I7121 Aneurysm of the ascending aorta, without rupture: Secondary | ICD-10-CM | POA: Diagnosis not present

## 2022-11-02 DIAGNOSIS — E872 Acidosis, unspecified: Secondary | ICD-10-CM | POA: Diagnosis not present

## 2022-11-02 DIAGNOSIS — I71019 Dissection of thoracic aorta, unspecified: Secondary | ICD-10-CM | POA: Diagnosis not present

## 2022-11-02 LAB — GLUCOSE, CAPILLARY
Glucose-Capillary: 75 mg/dL (ref 70–99)
Glucose-Capillary: 75 mg/dL (ref 70–99)
Glucose-Capillary: 84 mg/dL (ref 70–99)
Glucose-Capillary: 83 mg/dL (ref 70–99)
Glucose-Capillary: 97 mg/dL (ref 70–99)
Glucose-Capillary: 83 mg/dL (ref 70–99)
Glucose-Capillary: 100 mg/dL — ABNORMAL HIGH (ref 70–99)

## 2022-11-02 LAB — CBC
HCT: 41.9 % (ref 39.0–52.0)
Hemoglobin: 14.1 g/dL (ref 13.0–17.0)
MCH: 27 pg (ref 26.0–34.0)
MCHC: 33.7 g/dL (ref 30.0–36.0)
MCV: 80.1 fL (ref 80.0–100.0)
Platelets: 198 10*3/uL (ref 150–400)
RBC: 5.23 MIL/uL (ref 4.22–5.81)
RDW: 18.6 % — ABNORMAL HIGH (ref 11.5–15.5)
WBC: 16.6 10*3/uL — ABNORMAL HIGH (ref 4.0–10.5)
nRBC: 0.4 % — ABNORMAL HIGH (ref 0.0–0.2)

## 2022-11-02 LAB — LIPID PANEL
Cholesterol: 181 mg/dL (ref 0–200)
HDL: 17 mg/dL — ABNORMAL LOW (ref >40)
LDL Cholesterol: 137 mg/dL — ABNORMAL HIGH (ref 0–99)
Total CHOL/HDL Ratio: 10.6 RATIO
Triglycerides: 133 mg/dL (ref <150)
VLDL: 27 mg/dL (ref 0–40)

## 2022-11-02 LAB — BASIC METABOLIC PANEL
Anion gap: 10 (ref 5–15)
BUN: 40 mg/dL — ABNORMAL HIGH (ref 8–23)
CO2: 21 mmol/L — ABNORMAL LOW (ref 22–32)
Calcium: 8.4 mg/dL — ABNORMAL LOW (ref 8.9–10.3)
Chloride: 98 mmol/L (ref 98–111)
Creatinine, Ser: 1.83 mg/dL — ABNORMAL HIGH (ref 0.61–1.24)
GFR, Estimated: 39 mL/min — ABNORMAL LOW (ref >60)
Glucose, Bld: 76 mg/dL (ref 70–99)
Potassium: 3.8 mmol/L (ref 3.5–5.1)
Sodium: 129 mmol/L — ABNORMAL LOW (ref 135–145)

## 2022-11-02 MED ORDER — CEFAZOLIN SODIUM-DEXTROSE 1-4 GM/50ML-% IV SOLN
1.0000 g | Freq: Two times a day (BID) | INTRAVENOUS | Status: DC
  Administered 2022-11-02 – 2022-11-04 (×5): 1 g via INTRAVENOUS
  Filled 2022-11-02 (×5): qty 50

## 2022-11-02 MED ORDER — LACTATED RINGERS IV SOLN: INTRAVENOUS | Status: DC

## 2022-11-02 MED ORDER — HEPARIN SODIUM (PORCINE) 5000 UNIT/ML IJ SOLN
5000.0000 [IU] | Freq: Three times a day (TID) | INTRAMUSCULAR | Status: AC
  Administered 2022-11-02 – 2022-11-04 (×8): 5000 [IU] via SUBCUTANEOUS
  Filled 2022-11-02 (×8): qty 1

## 2022-11-02 MED ORDER — SODIUM CHLORIDE 0.9 % IV SOLN: INTRAVENOUS | Status: DC

## 2022-11-02 NOTE — Addendum Note (Signed)
Addendum  created 11/02/22 1023 by Beryle Lathe, MD   Clinical Note Signed

## 2022-11-02 NOTE — Progress Notes (Signed)
Subjective  - POD # 3, status post emergent repair of ruptured left thoracic aortic aneurysm with endovascular stent graft and laser fenestration and subsequent stenting to the left subclavian artery  Workup yesterday revealed multiple embolic strokes.  The patient was having leg weakness most pronounced on the left.  Neurology was consulted.  It was unclear if this weakness was from the strokes or possibly spinal cord issues.  Therefore a lumbar drain was placed.  His left leg strength is mildly improved today   Physical Exam:  Palpable bilateral dorsalis pedis and radial pulses Able to slightly raise left leg off of the bed.  Sensation is intact.       Assessment/Plan:  POD #3  TAAA: I was able to visualize the top portion of his repair yesterday on his neck CT angiogram.  The area that was actively extravasating, has been covered and is no longer bleeding. Neuro: The patient's neuroexam is slightly improved today.  The etiology of his weakness is unclear.  It is either from spinal cord issues or from his stroke.  His blood pressure is being titrated to keep a MAP greater than 90.  Lumbar drain was placed by anesthesia yesterday and has been draining overnight.  Renal: The patient has had an increase in his renal function with a creatinine of 1.8 today.  I am starting him on IV fluids.  This is likely secondary to contrast administration over the last 48 hours.  Hopefully this will resolve with hydration and time.  Lumbar drain: The patient will remain prone while we continue to drain spinal fluid which I will likely do for 48 hours before cathing it.  Prophylaxis: Will start subcu heparin today and continue Protonix  Acute blood loss anemia: Hemoglobin is stable  Infectious disease: I will cover him with Ancef while his spinal drain is in place.  No signs of infection currently  Mathew [name] 11/02/2022 9:17 AM --  Vitals:   11/02/22 0800 11/02/22 0900  BP: (!) 146/79  122/76  Pulse: 94 92  Resp: 15 11  Temp:    SpO2: 90% 95%    Intake/Output Summary (Last 24 hours) at 11/02/2022 0917 Last data filed at 11/02/2022 0900 Gross per 24 hour  Intake 774.96 ml  Output 756 ml  Net 18.96 ml     Laboratory CBC    Component Value Date/Time   WBC 16.6 (H) 11/02/2022 0410   HGB 14.1 11/02/2022 0410   HGB 18.4 (H) 10/23/2022 1417   HCT 41.9 11/02/2022 0410   PLT 198 11/02/2022 0410   PLT 238 10/23/2022 1417    BMET    Component Value Date/Time   NA 129 (L) 11/02/2022 0410   K 3.8 11/02/2022 0410   CL 98 11/02/2022 0410   CO2 21 (L) 11/02/2022 0410   GLUCOSE 76 11/02/2022 0410   BUN 40 (H) 11/02/2022 0410   CREATININE 1.83 (H) 11/02/2022 0410   CREATININE 1.25 (H) 10/23/2022 1417   CALCIUM 8.4 (L) 11/02/2022 0410   GFRNONAA 39 (L) 11/02/2022 0410   GFRNONAA >60 10/23/2022 1417   GFRAA >60 06/12/2018 0815    COAG Lab Results  Component Value Date   INR 1.3 (H) 11/01/2022   INR 1.3 (H) 10/25/2022   INR 1.0 09/02/2022   No results found for: "PTT"  Antibiotics Anti-infectives (From admission, onward)    Start     Dose/Rate Route Frequency Ordered Stop   10/30/22 2200  ceFAZolin (ANCEF) IVPB 2g/100 mL premix  2 g 200 mL/hr over 30 Minutes Intravenous Every 8 hours 10/30/22 2108 10/31/22 0541        V. Charlena Cross, M.D., [facility] [place] Office: [phone] Pager:  [phone]

## 2022-11-02 NOTE — Progress Notes (Signed)
NAME:  Mathew [name], MRN:  [id number], DOB:  24-Sep-1950, LOS: 3 ADMISSION DATE:  10/30/2022, CONSULTATION DATE:  6/26 REFERRING MD:  Dr. Myra Gianotti , CHIEF COMPLAINT:  Ruptured aortic aneurysm   History of Present Illness:  72 year old male with past medical history as below, which is significant for hepatocellular carcinoma on chemotherapy, paroxysmal atrial fibrillation, hypertension, and hepatitis C status post treatment.  He initially presented to [facility] emergency department on 6/21 with complaints of sudden onset chest pain and was diagnosed with thrombosed type B thoracic aortic dissection extending from the distal aortic arch to the level of the diaphragm.  He was admitted to the ICU for tight hemodynamic control and was ultimately discharged on 6/25 after having no expansion of the dissection.  He now presents again on 6/26 with the recurrence of chest pain.  CT angiogram in the emergency department demonstrated significant progression of the aortic dissection with focal area of active extravasation.  Vascular surgery was consulted and took the patient emergently to the operating room for treatment of aortic rupture with plans for endovascular repair.  PCCM was asked to admit to ICU postoperatively.  The L subclavian was covered with the stenting and surgery was able to provide outlet however states that the pressures in the L to R will still likely vary and the R extremity should be utilized for BP control.    Ccm has been asked to admit for medical management and BP control post operatively.   Pertinent  Medical History   has a past medical history of Hepatitis C, Hypertension, Obstructive sleep apnea, Premature ventricular contraction, and Prostate cancer (HCC) (2006).   Significant Hospital Events: Including procedures, antibiotic start and stop dates in addition to other pertinent events   Discharged with Type B Ao dissection with intramural thrombosis 6/25 Returned for admittance  6/26 with ascending Ao rupture Emergent OR for repair  Interim History / Subjective:  Patient continued to have left leg weakness Lumbar drain was placed yesterday He is off clevidipine infusion Goal is to keep the MAP 90-100  Objective   Blood pressure 122/76, pulse 92, temperature 98.5 F (36.9 C), temperature source Oral, resp. rate 11, height 6\' 3"  (1.905 m), weight 93.3 kg, SpO2 95 %.        Intake/Output Summary (Last 24 hours) at 11/02/2022 0910 Last data filed at 11/02/2022 0900 Gross per 24 hour  Intake 774.96 ml  Output 756 ml  Net 18.96 ml   Filed Weights   10/31/22 0500 11/01/22 0600 11/02/22 0500  Weight: 91.1 kg 95.6 kg 93.3 kg    Examination: Physical exam: General: Elderly male, lying on the bed HEENT: [place]/AT, eyes anicteric.  moist mucus membranes Neuro: Alert, awake following commands, left upper extremity intact and able to move, left lower extremity 2/5 Chest: Coarse breath sounds, no wheezes or rhonchi Heart: Regular rate and rhythm, no murmurs or gallops Abdomen: Soft, nontender, nondistended, bowel sounds present Skin: No rash  Labs and images were reviewed  Resolved Hospital Problem list     Assessment & Plan:  Ruptured type B aortic dissection s/p emergent endovascular repair  6/26> L subclavian was covered during surgery Vascular surgery is following Oral antihypertensive meds are on hold for now we will keep MAP between 90-100  Acute bilateral multifocal embolic ischemic stroke Cervical spinal cord edema/infarct Patient continued to have left lower extremity weakness, better than yesterday but still 2/5 Lumbar drain was placed by anesthesia yesterday, draining well Stroke team is following Continue  statin Brain MRI which showed bilateral multifocal embolic ischemic stroke involving right cerebellar splenium of the corpus callosum on the left, right occipital lobe, right parietal lobe, and left lentiform nucleus Also cervical spine MRI  showing spinal cord signal in the C6-C7, could be due to edema versus stroke Continue statin  Acute kidney injury Hyponatremia NAG acidosis Serum creatinine trending up, currently at 1.83 Closely monitor BMP Serum sodium remained at 129 Serum bicarbonate is improving to 21 Started on IV fluids Avoid nephrotoxic agents  Hypertension HLD Holding antihypertensive to keep MAP between 90-100 Continue atorvastatin  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- Patient is on Treatment Plan : LUNG Atezolizumab + Bevacizumab Maintenance q21d  Outpatient follow-up with oncology   Best Practice (right click and "Reselect all SmartList Selections" daily)   Diet/type: Regular consistency (see orders) > advance as tolerated DVT prophylaxis: prophylactic heparin  GI prophylaxis: N/A Lines: NA Foley:  N/A Code Status:  full code Last date of multidisciplinary goals of care discussion [6/27: Patient was updated at bedside, decision was to continue full scope of care]  Labs   CBC: Recent Labs  Lab 10/30/22 1203 10/30/22 2040 10/31/22 0450 11/02/22 0410  WBC 9.1 12.9* 14.9* 16.6*  HGB 17.5* 15.1 15.1 14.1  HCT 51.1 46.1 45.2 41.9  MCV 79.5* 81.6 80.3 80.1  PLT 237 199 227 198    Basic Metabolic Panel: Recent Labs  Lab 10/27/22 0842 10/28/22 2113 10/30/22 1203 10/30/22 2040 10/31/22 0450 11/02/22 0410  NA 128*  --  130*  --  130* 129*  K 4.0  --  3.5  --  4.2 3.8  CL 97*  --  101  --  101 98  CO2 19*  --  18*  --  20* 21*  GLUCOSE 105*  --  138*  --  113* 76  BUN 16  --  15  --  20 40*  CREATININE 1.06  --  1.00 1.05 0.93 1.83*  CALCIUM 8.4*  --  8.8*  --  8.6* 8.4*  MG 1.3* 2.3  --   --  1.9  --   PHOS 3.7  --   --   --  4.4  --    GFR: Estimated Creatinine Clearance: 44.9 mL/min (A) (by C-G formula based on SCr of 1.83 mg/dL (H)). Recent Labs  Lab 10/30/22 1203 10/30/22 2040 10/31/22 0450 11/02/22 0410  WBC 9.1 12.9* 14.9* 16.6*    Liver  Function Tests: Recent Labs  Lab 10/27/22 0842  AST 99*  ALT 19  ALKPHOS 109  BILITOT 1.5*  PROT 7.1  ALBUMIN 2.5*   No results for input(s): "LIPASE", "AMYLASE" in the last 168 hours.  No results for input(s): "AMMONIA" in the last 168 hours.  ABG    Component Value Date/Time   TCO2 24 11/29/2011 0002     Coagulation Profile: Recent Labs  Lab 11/01/22 1551  INR 1.3*     Cardiac Enzymes: No results for input(s): "CKTOTAL", "CKMB", "CKMBINDEX", "TROPONINI" in the last 168 hours.  HbA1C: Hgb A1c MFr Bld  Date/Time Value Ref Range Status  10/25/2022 09:58 AM 5.7 (H) 4.8 - 5.6 % Final    Comment:    (NOTE) Pre diabetes:          5.7%-6.4%  Diabetes:              >6.4%  Glycemic control for   <7.0% adults with diabetes     CBG: Recent Labs  Lab 11/01/22  2221 11/01/22 2328 11/02/22 0415 11/02/22 0657 11/02/22 0732  GLUCAP 82 94 75 75 84    The patient is critically ill due to ruptured type B aortic dissection, uncontrolled hypertension, acute bilateral multifocal strokes.  Critical care was necessary to treat or prevent imminent or life-threatening deterioration.  Critical care was time spent personally by me on the following activities: development of treatment plan with patient and/or surrogate as well as nursing, discussions with consultants, evaluation of patient's response to treatment, examination of patient, obtaining history from patient or surrogate, ordering and performing treatments and interventions, ordering and review of laboratory studies, ordering and review of radiographic studies, pulse oximetry, re-evaluation of patient's condition and participation in multidisciplinary rounds.   During this encounter critical care time was devoted to patient care services described in this note for 31 minutes.     Cheri Fowler, MD [place] [facility] See Amion for pager If no response to pager, please call [phone] until 7pm After 7pm,  Please call E-link [phone]

## 2022-11-02 NOTE — Progress Notes (Signed)
PT Cancellation Note  Patient Details Name: Mathew [name] MRN: [id number] DOB: 04-06-1951   Cancelled Treatment:    Reason Eval/Treat Not Completed: Medical issues which prohibited therapy (pt remains with lumbar drain and per vascular bedrest until out. Will plan to check back 7/1)   Mathew [name] Mathew [name] 11/02/2022, 10:57 AM Merryl Hacker, PT Acute Rehabilitation Services Office: [phone]

## 2022-11-02 NOTE — Anesthesia Post-op Follow-up Note (Addendum)
  Anesthesia Follow-up Note  Patient: Mathew [name]  Day #: 1  Date of Follow-up: 11/02/2022 Time: 9:50am  Last Vitals:  Vitals:   11/02/22 0900 11/02/22 0930  BP: 122/76 130/76  Pulse: 92 90  Resp: 11 12  Temp:    SpO2: 95% 95%    Level of Consciousness: alert  Pain: none   Side Effects:None  Catheter Site Exam:clean, dry, non-tender, non-indurated. Small amount of blood around catheter, not leaking out of dressing.  Other: Slight improvement to lower extremity weakness per notes and patient report since drain placement. Drain functioning as expected per bedside RN.  Anti-Coag Meds (From admission, onward)    Start     Dose/Rate Route Frequency Ordered Stop   11/02/22 1400  heparin injection 5,000 Units        5,000 Units Subcutaneous Every 8 hours 11/02/22 1610          Plan: Continue lumbar spinal drain at surgeon's request.  Beryle Lathe

## 2022-11-02 NOTE — Progress Notes (Addendum)
STROKE TEAM PROGRESS NOTE   BRIEF HPI [name] is a 72 y.o. 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  He was found to have a ruptured type B thoracic aortic dissection. He initially presented 6/21 to the ED with chest pain and EKG concerning for possible ST elevation. He eventually underwent CT C/A/P which revealed thr type B aortic dissection extending from the distal aortic arch just beyond the L subclavian artery to the level of the diaphragm with intramural thrombosis. His repeat scans were stable at that time and he was discharged 6/25 with antihypertensives and pain medications.  He returned the following day (6/26) and was taken emergently to the OR due to severe progression of his dissection with active extravasation consistent with rupture.  Postop day 1 he was found to have left-sided weakness/possible bilateral weakness which is concerning for cord ischemia versus CVA and stat brain MRI shows punctate acute infarcts in the right cerebellum, splenium of the corpus callosum on the left, right occipital lobe, right parietal lobe, and left lentiform nucleus.      SIGNIFICANT HOSPITAL EVENTS 6/26-admitted and had an emergent thoracic aortic dissection repair with vascular surgery 6/28 - neurology consulted for left lower extremity weakness-MRI brain shows punctate acute infarcts 6/28-lumbar drain placed   INTERM HISTORY/SUBJECTIVE Seems to have diminished sensation at his nipple line down to sharp sensation bilaterally.  He does report the sensation is symmetrical though.  Bilateral lower extremity weakness left worse than the right.  A lumbar drain was placed last night at shift change.  Denies back pain.  He does state that he has a burning sensation when he urinates, however he does  endorse prostate issues (s/p prostatectomy).   OBJECTIVE  CBC    Component Value Date/Time   WBC 16.6 (H) 11/02/2022 0410   RBC 5.23 11/02/2022 0410   HGB 14.1 11/02/2022 0410   HGB 18.4 (H) 10/23/2022 1417   HCT 41.9 11/02/2022 0410   PLT 198 11/02/2022 0410   PLT 238 10/23/2022 1417   MCV 80.1 11/02/2022 0410   MCH 27.0 11/02/2022 0410   MCHC 33.7 11/02/2022 0410   RDW 18.6 (H) 11/02/2022 0410   LYMPHSABS 3.0 10/23/2022 1417   MONOABS 0.7 10/23/2022 1417   EOSABS 0.1 10/23/2022 1417   BASOSABS 0.2 (H) 10/23/2022 1417    BMET    Component Value Date/Time   NA 129 (L) 11/02/2022 0410   K 3.8 11/02/2022 0410   CL 98 11/02/2022 0410   CO2 21 (L) 11/02/2022 0410   GLUCOSE 76 11/02/2022 0410   BUN 40 (H) 11/02/2022 0410   CREATININE 1.83 (H) 11/02/2022 0410   CREATININE 1.25 (H) 10/23/2022 1417   CALCIUM 8.4 (L) 11/02/2022 0410   GFRNONAA 39 (L) 11/02/2022 0410   GFRNONAA >60 10/23/2022 1417    IMAGING past 24 hours CT ANGIO HEAD NECK W WO CM  Result Date: 11/01/2022 CLINICAL DATA:  Stroke, follow-up EXAM: CT ANGIOGRAPHY HEAD AND NECK WITH AND WITHOUT CONTRAST TECHNIQUE: Multidetector CT imaging of the head and neck was performed using the standard protocol during bolus administration of intravenous contrast. Multiplanar CT image reconstructions and MIPs were obtained to evaluate the vascular anatomy. Carotid stenosis measurements (when applicable) are obtained utilizing NASCET criteria, using the distal internal carotid diameter as the denominator. RADIATION DOSE REDUCTION: This exam was performed  according to the departmental dose-optimization program which includes automated exposure control, adjustment of the mA and/or kV according to patient size and/or use of iterative reconstruction technique. CONTRAST:  75mL OMNIPAQUE IOHEXOL 350 MG/ML SOLN COMPARISON:  No prior CT head available, correlation is made with MRI head 11/01/2022 and CTA chest 10/30/2022 FINDINGS: CT HEAD  FINDINGS Brain: No evidence of acute infarct, hemorrhage, mass, mass effect, or midline shift. No hydrocephalus or extra-axial fluid collection. The punctate infarcts noted on the same-day MRI are not discernible on the CT. Vascular: No hyperdense vessel. Skull: Negative for fracture or focal lesion. Sinuses/Orbits: No acute finding. Other: The mastoid air cells are well aerated. CTA NECK FINDINGS Aortic arch: Two-vessel arch with a common origin of the brachiocephalic and left common carotid arteries. Imaged portion shows no evidence of known dissection, now status post graft placement. A stent extends into the origin of left subclavian artery, which appears narrowed as it passes through the aortic graft (series 11, image 348), measuring approximately 2 mm (series 11, image 348), with the proximal and distal diameter of the stent approximately 8 mm. Right carotid system: No evidence of stenosis, dissection, or occlusion. Left carotid system: No evidence of stenosis, dissection, or occlusion. Vertebral arteries: No evidence of stenosis, dissection, or occlusion. Diminished opacification of the left vertebral artery as it ascends in the cervical spine, which may be related to stenosis in the left subclavian stent. Skeleton: No acute osseous abnormality. Degenerative changes in the cervical spine. Other neck: No acute finding. Upper chest: Small left pleural effusion. Emphysema. Redemonstrated mediastinal hematoma, without evidence of contrast extravasation. Review of the MIP images confirms the above findings CTA HEAD FINDINGS Anterior circulation: Both internal carotid arteries are patent to the termini, without significant stenosis. A1 segments patent. Normal anterior communicating artery. Anterior cerebral arteries are patent to their distal aspects without significant stenosis. No M1 stenosis or occlusion. MCA branches perfused to their distal aspects without significant stenosis. Posterior circulation: Vertebral  arteries patent to the vertebrobasilar junction without significant stenosis, although diminished opacification of the left vertebral artery continues intracranially. Posterior inferior cerebellar artery patent on the right. Common origin of the left PICA and AICA a Basilar patent to its distal aspect without significant stenosis. Superior cerebellar arteries patent proximally. Patent P1 segments. PCAs perfused to their distal aspects without significant stenosis. The bilateral posterior communicating arteries are not visualized. Venous sinuses: As permitted by contrast timing, patent. Anatomic variants: None significant. Review of the MIP images confirms the above findings IMPRESSION: 1. No acute intracranial process. 2. No intracranial large vessel occlusion or significant stenosis. 3. No hemodynamically significant stenosis in the carotid and vertebral arteries. 4. Status post aortic arch graft for aortic dissection with left subclavian origin stent, which appears quite narrow as it passes through the graft. There is diminishing opacification of the left vertebral artery as it ascends in the cervical spine, as well as intracranially, concerning for hemodynamically significant stenosis from stent narrowing. 5. Redemonstrated mediastinal hematoma, without evidence of active contrast extravasation. Imaging results were communicated on 11/01/2022 at 4:08 pm to provider Dr. Amada Jupiter via secure text paging. Electronically Signed   By: Wiliam Ke M.D.   On: 11/01/2022 16:08    Vitals:   11/02/22 1130 11/02/22 1200 11/02/22 1230 11/02/22 1300  BP: 126/80 (!) 137/92 128/77 126/83  Pulse: 92 97 94 92  Resp: 12 18 13 14   Temp: 98.3 F (36.8 C)     TempSrc: Oral     SpO2: 95%  95% 96% 97%  Weight:      Height:         PHYSICAL EXAM General:  Alert, well-nourished, well-developed patient in no acute distress Psych:  Mood and affect appropriate for situation CV: Regular rate and rhythm on  monitor Respiratory:  Regular, unlabored respirations on room air GI: Abdomen soft and nontender   NEURO:  Mental Status: AA&Ox3, patient is able to give clear and coherent history Speech/Language: speech is without dysarthria or aphasia.  Naming, repetition, fluency, and comprehension intact.  Cranial Nerves:  II: PERRL. Visual fields full.  III, IV, VI: EOMI. Eyelids elevate symmetrically.  V: Sensation is intact to light touch and symmetrical to face.  VII: Face is symmetrical resting and smiling VIII: hearing intact to voice. IX, X: Palate elevates symmetrically. Phonation is normal.  UJ:[id number] shrug 5/5. XII: tongue is midline without fasciculations. Motor: Bilateral upper extremities with full strength Bilateral lower extremity weakness 3/5 left worse than right.  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   Etiology: Postoperative aortic dissection repair Code Stroke CT head No acute abnormality.  CTA head & neck no acute intracranial process, no intracranial large vessel occlusion or significant stenosis MRI  punctate acute infarcts in the R cerebellum, splenium of the corpus callosum on the L, R occipital lobe, R parietal lobe, and L lentiform nucleus  He did have an intraoperative TEE, however I am unable to see the report LDL 137 HgbA1c 5.7 VTE prophylaxis -subcu heparin No antithrombotic prior to admission, now on No antithrombotic will need to be discussed with vascular surgery and CCM prior to initiating antiplatelet or anticoagulant therapy Therapy  recommendations: Pending  disposition: Pending  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 Plan to repeat MRI thoracic spine tomorrow Lumbar drain placed 6/28 with orders to drain 10-30 cc/h  Type B aortic dissection 6/26- S/p 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 with coverage of the L subclavian artery and subsequent lasar fenestration at the L subclavian artery  Paroxysmal atrial fibrillation not on anticoagulation  Hypertension Home meds: Losartan, isosorbide bide mononitrate, spironolactone Stable Per vascular surgery-MAP greater than 90  Hyperlipidemia Home meds: Atorvastatin 80 mg, resumed in hospital LDL 137, goal < 70 Continue statin at discharge  Other Active Problems 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 MD to update note as needed.   Elmer Picker, DNP, FNP-BC [facility] Pager: [phone] STROKE MD NOTE : I have personally obtained history,examined this patient, reviewed notes, independently viewed imaging studies, participated in medical decision making and plan of care.ROS completed by me personally and pertinent positives fully documented  I have made any additions or clarifications directly to the above note. Agree with note above.  Patient had 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 with coverage of the L subclavian artery and subsequent lasar fenestration at the L subclavian artery.  He developed bilateral leg weakness with MRI showing small bilateral embolic infarcts which do not explain the degree of leg weakness.  MRI of cervical spine shows spinal  cord hyperintensities which could be spinal cord infarct.  MRI scan of the thoracic spine unfortunately is not a good diagnostic quality but the patient's clinical exam does suggest sensory loss from the chest down with  asymmetric bilateral leg weakness which does suggest thoracic spinal cord infarct.  Patient has had a lumbar drain placed yesterday with plan for permissive hypertension to hopefully reduce spinal cord injury.  Long discussion with patient and 2 lady family members at the bedside and answered questions.  Discussed with Dr. Merrily Pew. This patient is critically ill and at significant risk of neurological worsening, death and care requires constant monitoring of vital signs, hemodynamics,respiratory and cardiac monitoring, extensive review of multiple databases, frequent neurological assessment, discussion with family, other specialists and medical decision making of high complexity.I have made any additions or clarifications directly to the above note.This critical care time does not reflect procedure time, or teaching time or supervisory time of PA/NP/Med Resident etc but could involve care discussion time.  I spent 30 minutes of neurocritical care time  in the care of  this patient.      Delia Heady, MD Medical Director [facility] Pager: [phone] 11/02/2022 2:26 PM  To contact Stroke Continuity provider, please refer to [email or web address]. After hours, contact General Neurology

## 2022-11-02 NOTE — Progress Notes (Signed)
Pt c/o continued unchanged left shoulder and left back pain.  Refer to [facility] for intervention.  At 0650 the pt report he was now experiencing less mobility in LLE.  Pt now not able to bend knee but able to move LLE from side to side, dorsiflex/planterflex left foot and wiggle toes.  Increased weakness noted.  Sensation present.  Pulses palpable.  Neuro assessment unchanged otherwise.  Vascular at bedside rounding.  Update provided on motor changes in LLE from initial baseline assessment at change of shift.  Low UOP overnight.

## 2022-11-02 NOTE — Evaluation (Signed)
Speech Language Pathology Evaluation Patient Details Name: Mathew [name] MRN: [id number] DOB: [birth year] Today's Date: 11/02/2022 Time: 4098-1191 SLP Time Calculation (min) (ACUTE ONLY): 20 min  Problem List:  Patient Active Problem List   Diagnosis Date Noted   Acute thoracic aortic dissection (HCC) 10/30/2022   Ascending aortic aneurysm, unspecified whether ruptured (HCC) 10/30/2022   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   Normal anion gap metabolic acidosis 10/30/2022   Malignant hypertension 10/28/2022   AKI (acute kidney injury) (HCC) 10/27/2022   Hypomagnesemia 10/27/2022   Metastatic malignant neoplasm (HCC) 10/26/2022   Dissection of descending thoracic aorta (HCC) 10/25/2022   Hepatocellular carcinoma (HCC) 09/03/2022   Liver mass 08/21/2022   Medication side effect 10/16/2021   IPF (idiopathic pulmonary fibrosis) (HCC) 08/17/2021   Smoker 08/17/2021   Past Medical History:  Past Medical History:  Diagnosis Date   Hepatitis C    s/p treatment   Hypertension    Obstructive sleep apnea    unable to tolerate CPAP   Premature ventricular contraction    Prostate cancer (HCC) 2006   Past Surgical History:  Past Surgical History:  Procedure Laterality Date   LOOP RECORDER INSERTION N/A 07/04/2016   Procedure: Loop Recorder Insertion;  Surgeon: Hillis Range, MD;  Location: [facility] INVASIVE CV LAB;  Service: Cardiovascular;  Laterality: N/A;   prostectomy     SPLENECTOMY, TOTAL     TEE WITHOUT CARDIOVERSION  10/30/2022   Procedure: TRANSESOPHAGEAL ECHOCARDIOGRAM;  Surgeon: Nada Libman, MD;  Location: [facility] OR;  Service: Vascular;;   THORACIC AORTIC ENDOVASCULAR STENT GRAFT N/A 10/30/2022   Procedure: THORACIC AORTIC ENDOVASCULAR STENT GRAFT with Left Subclavian artery revascularization with laser.;  Surgeon: Nada Libman, MD;  Location: [facility] OR;  Service: Vascular;  Laterality: N/A;   HPI:  Patient is a 72 y.o. male with PMH: prostate cancer,  hepatitis C, HTN, OSA (unable to tolerate CPAP), h/o heptocellular cancer(getting chemotherapy). He presented to the hospital on 10/25/22 with chest and abdominal pain with workup revealing type B aortic dissection and was treated with blood pressure medication and pain management. Follow up CT did not show significant change. He discharged home 6/25 but in AM 6/26 his chest pain returned so he came back to the hospital.  MRI brain showed punctate acute infarcts in the right cerebellum, splenium of the  corpus callosum on the left, right occipital lobe, right parietal lobe, and left lentiform nucleus.   Assessment / Plan / Recommendation Clinical Impression  Patient currently presenting with moderately impaired cognition functioning as per this evaluation. He was awake and alert but seemed to have difficulty maintaining attention/focus throughout session. He told SLP that he "got a few cat naps in". He was oriented x4. He participated in completing SLUMS examination and his score of 19 places him well below the average and indicates a cognitive impairment. He was slow to respond with computational questions and required several trials to demonstrate adequate memory storage with 5 words but he did recall 4 out of the 5 words after a 4 minute delay. He struggled greatly with clock drawing task and although he did self-correct when he wrote numbers too close together, he was not able to adequately correct his errors. Patient reported what sounded like some visual hallucinations, such as his room appearing pink and his bed oriented vertically instead of horizontally. He also endorsed that he "sees black spots sometimes." Patient was not able to adequately elaborate/explain statements like these. SLP is recommending continued skilled  services.    SLP Assessment  SLP Recommendation/Assessment: Patient needs continued Speech Lanaguage Pathology Services SLP Visit Diagnosis: Cognitive communication deficit (R41.841)     Recommendations for follow up therapy are one component of a multi-disciplinary discharge planning process, led by the attending physician.  Recommendations may be updated based on patient status, additional functional criteria and insurance authorization.    Follow Up Recommendations  Other (comment) (TBD)    Assistance Recommended at Discharge  Intermittent Supervision/Assistance  Functional Status Assessment Patient has had a recent decline in their functional status and demonstrates the ability to make significant improvements in function in a reasonable and predictable amount of time.  Frequency and Duration min 1 x/week  1 week      SLP Evaluation Cognition  Overall Cognitive Status: Impaired/Different from baseline Arousal/Alertness: Awake/alert Orientation Level: Oriented X4 Year: 2024 Month: June Day of Week: Correct Attention: Sustained Sustained Attention: Impaired Sustained Attention Impairment: Verbal complex Memory: Impaired Memory Impairment: Storage deficit Problem Solving: Impaired Problem Solving Impairment: Verbal complex;Functional basic Executive Function: Landscape architect: Impaired Organizing Impairment: Verbal basic;Verbal complex       Comprehension  Auditory Comprehension Overall Auditory Comprehension: Appears within functional limits for tasks assessed Conversation: Simple    Expression Expression Primary Mode of Expression: Verbal Verbal Expression Overall Verbal Expression: Appears within functional limits for tasks assessed   Oral / Motor  Oral Motor/Sensory Function Overall Oral Motor/Sensory Function: Within functional limits Motor Speech Overall Motor Speech: Appears within functional limits for tasks assessed Respiration: Within functional limits Resonance: Within functional limits Articulation: Within functional limitis Intelligibility: Intelligible Motor Planning: Witnin functional limits Motor Speech Errors: Not  applicable            Angela Nevin, MA, CCC-SLP Speech Therapy

## 2022-11-02 NOTE — Progress Notes (Signed)
Paged Mathew Gianotti, MD to clarify orders for lumbar drain. Drained 30cc when returned to 2H. Neuro ICU RN verified drain set-up. Orders to drain up to 30cc/hr, goal ICP is 8-12. Patient reports no signs of headache, will continue to monitor.

## 2022-11-03 DIAGNOSIS — I7113 Aneurysm of the descending thoracic aorta, ruptured: Secondary | ICD-10-CM | POA: Diagnosis not present

## 2022-11-03 DIAGNOSIS — I7121 Aneurysm of the ascending aorta, without rupture: Secondary | ICD-10-CM | POA: Diagnosis not present

## 2022-11-03 DIAGNOSIS — I71019 Dissection of thoracic aorta, unspecified: Secondary | ICD-10-CM | POA: Diagnosis not present

## 2022-11-03 DIAGNOSIS — I6310 Cerebral infarction due to embolism of unspecified precerebral artery: Secondary | ICD-10-CM | POA: Diagnosis not present

## 2022-11-03 LAB — BASIC METABOLIC PANEL
Anion gap: 9 (ref 5–15)
BUN: 46 mg/dL — ABNORMAL HIGH (ref 8–23)
CO2: 22 mmol/L (ref 22–32)
Calcium: 8.3 mg/dL — ABNORMAL LOW (ref 8.9–10.3)
Chloride: 98 mmol/L (ref 98–111)
Creatinine, Ser: 1.86 mg/dL — ABNORMAL HIGH (ref 0.61–1.24)
GFR, Estimated: 38 mL/min — ABNORMAL LOW (ref >60)
Glucose, Bld: 76 mg/dL (ref 70–99)
Potassium: 3.9 mmol/L (ref 3.5–5.1)
Sodium: 129 mmol/L — ABNORMAL LOW (ref 135–145)

## 2022-11-03 LAB — CBC
HCT: 40.8 % (ref 39.0–52.0)
Hemoglobin: 13.4 g/dL (ref 13.0–17.0)
MCH: 26.3 pg (ref 26.0–34.0)
MCHC: 32.8 g/dL (ref 30.0–36.0)
MCV: 80 fL (ref 80.0–100.0)
Platelets: 227 10*3/uL (ref 150–400)
RBC: 5.1 MIL/uL (ref 4.22–5.81)
RDW: 18.5 % — ABNORMAL HIGH (ref 11.5–15.5)
WBC: 15.8 10*3/uL — ABNORMAL HIGH (ref 4.0–10.5)
nRBC: 0.5 % — ABNORMAL HIGH (ref 0.0–0.2)

## 2022-11-03 LAB — GLUCOSE, CAPILLARY
Glucose-Capillary: 77 mg/dL (ref 70–99)
Glucose-Capillary: 93 mg/dL (ref 70–99)
Glucose-Capillary: 68 mg/dL — ABNORMAL LOW (ref 70–99)
Glucose-Capillary: 71 mg/dL (ref 70–99)
Glucose-Capillary: 114 mg/dL — ABNORMAL HIGH (ref 70–99)
Glucose-Capillary: 98 mg/dL (ref 70–99)
Glucose-Capillary: 106 mg/dL — ABNORMAL HIGH (ref 70–99)
Glucose-Capillary: 92 mg/dL (ref 70–99)

## 2022-11-03 LAB — MAGNESIUM: Magnesium: 2.2 mg/dL (ref 1.7–2.4)

## 2022-11-03 MED ORDER — SODIUM CHLORIDE 0.9 % IV SOLN
250.0000 mL | INTRAVENOUS | Status: DC
  Administered 2022-11-05: 250 mL via INTRAVENOUS

## 2022-11-03 MED ORDER — NOREPINEPHRINE 4 MG/250ML-% IV SOLN
2.0000 ug/min | INTRAVENOUS | Status: DC
  Administered 2022-11-03: 2 ug/min via INTRAVENOUS
  Filled 2022-11-03 (×2): qty 250

## 2022-11-03 NOTE — Progress Notes (Signed)
NAME:  Mathew [name], MRN:  [id number], DOB:  1951/03/20, LOS: 4 ADMISSION DATE:  10/30/2022, CONSULTATION DATE:  6/26 REFERRING MD:  Dr. Myra Gianotti , CHIEF COMPLAINT:  Ruptured aortic aneurysm   History of Present Illness:  72 year old male with past medical history as below, which is significant for hepatocellular carcinoma on chemotherapy, paroxysmal atrial fibrillation, hypertension, and hepatitis C status post treatment.  He initially presented to [facility] emergency department on 6/21 with complaints of sudden onset chest pain and was diagnosed with thrombosed type B thoracic aortic dissection extending from the distal aortic arch to the level of the diaphragm.  He was admitted to the ICU for tight hemodynamic control and was ultimately discharged on 6/25 after having no expansion of the dissection.  He now presents again on 6/26 with the recurrence of chest pain.  CT angiogram in the emergency department demonstrated significant progression of the aortic dissection with focal area of active extravasation.  Vascular surgery was consulted and took the patient emergently to the operating room for treatment of aortic rupture with plans for endovascular repair.  PCCM was asked to admit to ICU postoperatively.  The L subclavian was covered with the stenting and surgery was able to provide outlet however states that the pressures in the L to R will still likely vary and the R extremity should be utilized for BP control.    Ccm has been asked to admit for medical management and BP control post operatively.   Pertinent  Medical History   has a past medical history of Hepatitis C, Hypertension, Obstructive sleep apnea, Premature ventricular contraction, and Prostate cancer (HCC) (2006).   Significant Hospital Events: Including procedures, antibiotic start and stop dates in addition to other pertinent events   Discharged with Type B Ao dissection with intramural thrombosis 6/25 Returned for admittance  6/26 with ascending Ao rupture Emergent OR for repair  Interim History / Subjective:  Patient continued to have left leg weakness, slightly worse than yesterday Lumbar drain is in 80s, draining clear CSF Will start him on vasopressor support with to keep SBP ~140  Objective   Blood pressure 119/77, pulse 94, temperature 98.2 F (36.8 C), temperature source Oral, resp. rate 12, height 6\' 3"  (1.905 m), weight 94 kg, SpO2 94 %.        Intake/Output Summary (Last 24 hours) at 11/03/2022 0806 Last data filed at 11/03/2022 0700 Gross per 24 hour  Intake 1927.42 ml  Output 1115 ml  Net 812.42 ml   Filed Weights   11/01/22 0600 11/02/22 0500 11/03/22 0500  Weight: 95.6 kg 93.3 kg 94 kg    Examination: Physical exam: General: Elderly male, lying on the bed HEENT: [place]/AT, eyes anicteric.  moist mucus membranes Neuro: Alert, awake following commands.  Left lower extremity 1/5, left upper extremity antigravity.  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 Patient stated his left leg weakness is worse than last night, currently at 1/5 He has lumbar drain in place draining clear CSF His systolic blood pressure  is 105 with MAP of 95 Will augment blood pressure with SBP goal 140-160 Started on Levophed  Ischemic stroke of brain does not explain his left leg weakness, this could be related to spinal cord infarction Stroke team is following Continue statin Brain  MRI which showed bilateral multifocal embolic ischemic stroke involving right cerebellar splenium of the corpus callosum on the left, right occipital lobe, right parietal lobe, and left lentiform nucleus Also cervical spine MRI showing spinal cord signal in the C6-C7, could be due to edema versus stroke Continue statin Patient will need repeat MRI of thoracic spine but he has lumbar drain, will wait till tomorrow  Acute kidney injury Hyponatremia NAG acidosis Serum creatinine remained up around 1.8 Closely monitor BMP Serum sodium remained at 129 Serum bicarbonate is improved to 22 Gentle fluid hydration with LR Avoid nephrotoxic agents  Hypertension HLD Holding antihypertensive to keep SBP 140-160 Continue atorvastatin  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- Patient is on Treatment Plan : LUNG Atezolizumab + Bevacizumab Maintenance q21d  Outpatient follow-up with oncology   Best Practice (right click and "Reselect all SmartList Selections" daily)   Diet/type: Regular consistency (see orders) > advance as tolerated DVT prophylaxis: prophylactic heparin  GI prophylaxis: N/A Lines: NA Foley:  N/A Code Status:  full code Last date of multidisciplinary goals of care discussion [6/30: Patient and his family updated at bedside, decision was to continue full scope of care]  Labs   CBC: Recent Labs  Lab 10/30/22 1203 10/30/22 2040 10/31/22 0450 11/02/22 0410 11/03/22 0421  WBC 9.1 12.9* 14.9* 16.6* 15.8*  HGB 17.5* 15.1 15.1 14.1 13.4  HCT 51.1 46.1 45.2 41.9 40.8  MCV 79.5* 81.6 80.3 80.1 80.0  PLT 237 199 227 198 227    Basic Metabolic Panel: Recent Labs  Lab 10/27/22 0842 10/28/22 2113 10/30/22 1203 10/30/22 2040 10/31/22 0450 11/02/22 0410 11/03/22 0421  NA 128*  --  130*  --  130* 129* 129*  K 4.0  --  3.5  --  4.2 3.8 3.9  CL 97*  --  101  --  101 98 98  CO2 19*  --  18*  --  20* 21* 22  GLUCOSE 105*  --  138*  --  113* 76 76   BUN 16  --  15  --  20 40* 46*  CREATININE 1.06  --  1.00 1.05 0.93 1.83* 1.86*  CALCIUM 8.4*  --  8.8*  --  8.6* 8.4* 8.3*  MG 1.3* 2.3  --   --  1.9  --  2.2  PHOS 3.7  --   --   --  4.4  --   --    GFR: Estimated Creatinine Clearance: 44.2 mL/min (A) (by C-G formula based on SCr of 1.86 mg/dL (H)). Recent Labs  Lab 10/30/22 2040 10/31/22 0450 11/02/22 0410 11/03/22 0421  WBC 12.9* 14.9* 16.6* 15.8*    Liver Function Tests: Recent Labs  Lab 10/27/22 0842  AST 99*  ALT 19  ALKPHOS 109  BILITOT 1.5*  PROT 7.1  ALBUMIN 2.5*   No results for input(s): "LIPASE", "AMYLASE" in the last 168 hours.  No results for input(s): "AMMONIA" in the last 168 hours.  ABG    Component Value Date/Time   TCO2 24 11/29/2011 0002     Coagulation Profile: Recent Labs  Lab 11/01/22 1551  INR 1.3*     Cardiac Enzymes: No results for input(s): "CKTOTAL", "CKMB", "CKMBINDEX", "TROPONINI" in the last  168 hours.  HbA1C: Hgb A1c MFr Bld  Date/Time Value Ref Range Status  10/25/2022 09:58 AM 5.7 (H) 4.8 - 5.6 % Final    Comment:    (NOTE) Pre diabetes:          5.7%-6.4%  Diabetes:              >6.4%  Glycemic control for   <7.0% adults with diabetes     CBG: Recent Labs  Lab 11/02/22 1947 11/02/22 2303 11/03/22 0327 11/03/22 0659 11/03/22 0739  GLUCAP 83 100* 77 93 68*    The patient is critically ill due to ruptured type B aortic dissection, uncontrolled hypertension, acute bilateral multifocal strokes.  Critical care was necessary to treat or prevent imminent or life-threatening deterioration.  Critical care was time spent personally by me on the following activities: development of treatment plan with patient and/or surrogate as well as nursing, discussions with consultants, evaluation of patient's response to treatment, examination of patient, obtaining history from patient or surrogate, ordering and performing treatments and interventions, ordering and review of  laboratory studies, ordering and review of radiographic studies, pulse oximetry, re-evaluation of patient's condition and participation in multidisciplinary rounds.   During this encounter critical care time was devoted to patient care services described in this note for 33 minutes.     Cheri Fowler, MD [facility] See Amion for pager If no response to pager, please call [phone] until 7pm After 7pm, Please call E-link [phone]

## 2022-11-03 NOTE — Addendum Note (Signed)
Addendum  created 11/03/22 1101 by Atilano Median, DO   Clinical Note Signed

## 2022-11-03 NOTE — Anesthesia Post-op Follow-up Note (Signed)
  Anesthesia Pain Follow-up Note  Patient: Mathew [name]  Day #: 2  Date of Follow-up: 11/03/2022 Time: 10:59 AM  Last Vitals:  Vitals:   11/03/22 0700 11/03/22 0730  BP: 119/77   Pulse: 94   Resp: 12   Temp:  36.8 C  SpO2: 94%     Level of Consciousness: alert  Pain: none   Side Effects:None  Catheter Site Exam:clean, dry, no drainage. Non-tender, small amount of dried blood at catheter insertion site, no drainage. CSF clear.   Anti-Coag Meds (From admission, onward)    Start     Dose/Rate Route Frequency Ordered Stop   11/02/22 1400  heparin injection 5,000 Units        5,000 Units Subcutaneous Every 8 hours 11/02/22 1610          Plan: Continue lumbar spinal drain at surgeon's request. Likely planned removal tomorrow.   Earl Lites P Jaquawn Saffran

## 2022-11-03 NOTE — Progress Notes (Signed)
STROKE TEAM PROGRESS NOTE   BRIEF HPI [name] is a 72 y.o. 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  He was found to have a ruptured type B thoracic aortic dissection. He initially presented 6/21 to the ED with chest pain and EKG concerning for possible ST elevation. He eventually underwent CT C/A/P which revealed thr type B aortic dissection extending from the distal aortic arch just beyond the L subclavian artery to the level of the diaphragm with intramural thrombosis. His repeat scans were stable at that time and he was discharged 6/25 with antihypertensives and pain medications.  He returned the following day (6/26) and was taken emergently to the OR due to severe progression of his dissection with active extravasation consistent with rupture.  Postop day 1 he was found to have left-sided weakness/possible bilateral weakness which is concerning for cord ischemia versus CVA and stat brain MRI shows punctate acute infarcts in the right cerebellum, splenium of the corpus callosum on the left, right occipital lobe, right parietal lobe, and left lentiform nucleus.      SIGNIFICANT HOSPITAL EVENTS 6/26-admitted and had an emergent thoracic aortic dissection repair with vascular surgery 6/28 - neurology consulted for left lower extremity weakness-MRI brain shows punctate acute infarcts 6/28-lumbar drain placed   INTERM HISTORY/SUBJECTIVE A lady family member is at the bedside.  Seems to have diminished sensation at his nipple line down to sharp sensation bilaterally.    Bilateral lower extremity weakness left worse than the right persists.  A lumbar drain continues to be in place.  He has been started on vasopressors this morning to boost blood pressure..  Denies back pain.       OBJECTIVE  CBC    Component Value Date/Time   WBC 15.8 (H) 11/03/2022 0421   RBC 5.10 11/03/2022 0421   HGB 13.4 11/03/2022 0421   HGB 18.4 (H) 10/23/2022 1417   HCT 40.8 11/03/2022 0421   PLT 227 11/03/2022 0421   PLT 238 10/23/2022 1417   MCV 80.0 11/03/2022 0421   MCH 26.3 11/03/2022 0421   MCHC 32.8 11/03/2022 0421   RDW 18.5 (H) 11/03/2022 0421   LYMPHSABS 3.0 10/23/2022 1417   MONOABS 0.7 10/23/2022 1417   EOSABS 0.1 10/23/2022 1417   BASOSABS 0.2 (H) 10/23/2022 1417    BMET    Component Value Date/Time   NA 129 (L) 11/03/2022 0421   K 3.9 11/03/2022 0421   CL 98 11/03/2022 0421   CO2 22 11/03/2022 0421   GLUCOSE 76 11/03/2022 0421   BUN 46 (H) 11/03/2022 0421   CREATININE 1.86 (H) 11/03/2022 0421   CREATININE 1.25 (H) 10/23/2022 1417   CALCIUM 8.3 (L) 11/03/2022 0421   GFRNONAA 38 (L) 11/03/2022 0421   GFRNONAA >60 10/23/2022 1417    IMAGING past 24 hours No results found.  Vitals:   11/03/22 1130 11/03/22 1200 11/03/22 1230 11/03/22 1245  BP: (!) 146/77 (!) 152/76 124/62 138/80  Pulse: 93 91 89 91  Resp: 13 12 13 14   Temp: 98.5 F (36.9 C)     TempSrc: Oral     SpO2: 94% 91% 94% 95%  Weight:      Height:         PHYSICAL EXAM General:  Alert, well-nourished, well-developed patient in no acute distress Psych:  Mood and affect appropriate for  situation CV: Regular rate and rhythm on monitor Respiratory:  Regular, unlabored respirations on room air GI: Abdomen soft and nontender   NEURO:  Mental Status: AA&Ox3, patient is able to give clear and coherent history Speech/Language: speech is without dysarthria or aphasia.  Naming, repetition, fluency, and comprehension intact.  Cranial Nerves:  II: PERRL. Visual fields full.  III, IV, VI: EOMI. Eyelids elevate symmetrically.  V: Sensation is intact to light touch and symmetrical to face.  VII: Face is symmetrical resting and smiling VIII: hearing intact to voice. IX, X: Palate elevates  symmetrically. Phonation is normal.  UE:[id number] shrug 5/5. XII: tongue is midline without fasciculations. Motor: Bilateral upper extremities with full strength Bilateral lower extremity weakness 2-3/5 left worse than right.  Hip flexion weakness greater than extension.  Ankle dorsiflexors is weaker than plantarflexors.  He has good strength in hip adductor's and abductor's but knee extensors are weaker bilaterally Tone: is normal and bulk is normal Sensation-diminished sensation to sharp and dull sensation below the level of the nipple bilaterally, endorses normal sensation in the face, arms, and shoulders Coordination: FTN intact bilaterally, unable to perform HKS Gait- deferred   ASSESSMENT/PLAN  Acute Ischemic Infarct: Scattered punctate infarcts in the bilateral hemispheres and spinal cord infarct suspected thoracic level.  Etiology unclear from dissection or from endovascular repair Etiology: Postoperative aortic dissection repair Code Stroke CT head No acute abnormality.  CTA head & neck no acute intracranial process, no intracranial large vessel occlusion or significant stenosis MRI  punctate acute infarcts in the R cerebellum, splenium of the corpus callosum on the L, R occipital lobe, R parietal lobe, and L lentiform nucleus  He did have an intraoperative TEE, however I am unable to see the report LDL 137 HgbA1c 5.7 VTE prophylaxis -subcu heparin No antithrombotic prior to admission, now on No antithrombotic will need to be discussed with vascular surgery and CCM prior to initiating antiplatelet or anticoagulant therapy Therapy recommendations: Pending  disposition: Pending  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 Spinal cord hyperintense lesion at C6 unlikely to explain his significant paraparesis.  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 not visualized on MRI due to metallic artifacts from endovascular aneurysm repair  lumbar drain placed 6/28 with orders to drain 10-30 cc/h  Type B  aortic dissection 6/26- S/p 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 with coverage of the L subclavian artery and subsequent lasar fenestration at the L subclavian artery  Paroxysmal atrial fibrillation not on anticoagulation  Hypertension Home meds: Losartan, isosorbide bide mononitrate, spironolactone Stable Per vascular surgery-MAP greater than 90  Hyperlipidemia Home meds: Atorvastatin 80 mg, resumed in hospital LDL 137, goal < 70 Continue statin at discharge  Other Active Problems Hepatobiliary carcinoma with metastasis to the lung Hepatitis C -treated Prostate cancer status post prostatectomy  Hospital day # 4  Patient presented with dissecting aortic aneurysm and had 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 with coverage of the L subclavian artery and subsequent laser fenestration at the L subclavian artery.  He developed bilateral leg weakness with MRI showing small bilateral embolic infarcts which do not explain the degree of leg weakness.  MRI of cervical spine shows spinal cord hyperintensities which could be spinal cord infarct but cannot explain convincingly bilateral lower extremity weakness and sensory level on the trunk at the nipple level.Marland Kitchen  MRI scan of the thoracic spine unfortunately is not a good diagnostic quality but the patient's clinical exam does suggest sensory loss from the chest down with  asymmetric bilateral leg weakness which does suggest thoracic spinal cord infarct.  Patient has had a lumbar drain placed yesterday with plan for permissive hypertension to hopefully reduce spinal cord injury.  Long discussion with patient and 2 lady family members at the bedside and answered questions.  Discussed with Dr. Merrily Pew.  And Dr. Kathrine Cords augmentation of blood pressure with low-dose pressors and continue lumbar drain for another day as well vascular surgery team.   This patient is critically ill and at significant risk of  neurological worsening, death and care requires constant monitoring of vital signs, hemodynamics,respiratory and cardiac monitoring, extensive review of multiple databases, frequent neurological assessment, discussion with family, other specialists and medical decision making of high complexity.I have made any additions or clarifications directly to the above note.This critical care time does not reflect procedure time, or teaching time or supervisory time of PA/NP/Med Resident etc but could involve care discussion time.  I spent 30 minutes of neurocritical care time  in the care of  this patient.        Delia Heady, MD Medical Director [facility] St[facility] Pager: [phone] 11/03/2022 1:08 PM  To contact Stroke Continuity provider, please refer to [email or web address]. After hours, contact General Neurology

## 2022-11-03 NOTE — Progress Notes (Signed)
    Subjective  - POD # 4, status post emergent repair of ruptured left thoracic aortic aneurysm with endovascular stent graft and laser fenestration and subsequent stenting to the left subclavian artery   Subjective feeling of weakness in the left leg was worse overnight   Physical Exam:  Palpable bilateral dorsalis pedis and bilateral radial pulses. Persistent left leg weakness   Assessment/Plan:  POD #4  Neuro: The patient's neuroexam is slightly worse today.  The etiology of his weakness is unclear.  It is either from spinal cord issues or from his stroke.  His blood pressure is being titrated to keep a MAP greater than 90, however his systolic pressures have been low.  We will start him on Levophed to keep a goal systolic pressure between 140 and 160.  Lumbar drain has been drained overnight.  Pressures are stable.   Renal: No significant change in creatinine today.  Continue to monitor   Lumbar drain: Functioning properly.  Continue to drain per protocol   Prophylaxis: Subcutaneous heparin and ProtonixAcute blood loss anemia: Hemoglobin is stable   Infectious disease: I will cover him with Ancef while his spinal drain is in place.  No signs of infection currently  Mathew [name] 11/03/2022 9:28 AM --  Vitals:   11/03/22 0700 11/03/22 0730  BP: 119/77   Pulse: 94   Resp: 12   Temp:  98.2 F (36.8 C)  SpO2: 94%     Intake/Output Summary (Last 24 hours) at 11/03/2022 0928 Last data filed at 11/03/2022 0700 Gross per 24 hour  Intake 1892.46 ml  Output 1100 ml  Net 792.46 ml     Laboratory CBC    Component Value Date/Time   WBC 15.8 (H) 11/03/2022 0421   HGB 13.4 11/03/2022 0421   HGB 18.4 (H) 10/23/2022 1417   HCT 40.8 11/03/2022 0421   PLT 227 11/03/2022 0421   PLT 238 10/23/2022 1417    BMET    Component Value Date/Time   NA 129 (L) 11/03/2022 0421   K 3.9 11/03/2022 0421   CL 98 11/03/2022 0421   CO2 22 11/03/2022 0421   GLUCOSE 76 11/03/2022 0421    BUN 46 (H) 11/03/2022 0421   CREATININE 1.86 (H) 11/03/2022 0421   CREATININE 1.25 (H) 10/23/2022 1417   CALCIUM 8.3 (L) 11/03/2022 0421   GFRNONAA 38 (L) 11/03/2022 0421   GFRNONAA >60 10/23/2022 1417   GFRAA >60 06/12/2018 0815    COAG Lab Results  Component Value Date   INR 1.3 (H) 11/01/2022   INR 1.3 (H) 10/25/2022   INR 1.0 09/02/2022   No results found for: "PTT"  Antibiotics Anti-infectives (From admission, onward)    Start     Dose/Rate Route Frequency Ordered Stop   11/02/22 1015  ceFAZolin (ANCEF) IVPB 1 g/50 mL premix        1 g 100 mL/hr over 30 Minutes Intravenous Every 12 hours 11/02/22 0922     10/30/22 2200  ceFAZolin (ANCEF) IVPB 2g/100 mL premix        2 g 200 mL/hr over 30 Minutes Intravenous Every 8 hours 10/30/22 2108 10/31/22 0541        V. Charlena Cross, M.D., [facility] [place] Office: [phone] Pager:  [phone]

## 2022-11-04 ENCOUNTER — Encounter (HOSPITAL_COMMUNITY): Payer: Self-pay | Admitting: Anesthesiology

## 2022-11-04 ENCOUNTER — Inpatient Hospital Stay (HOSPITAL_COMMUNITY): Payer: BC Managed Care – PPO

## 2022-11-04 DIAGNOSIS — 419620001 Death: Secondary | SNOMED CT | POA: Diagnosis not present

## 2022-11-04 DIAGNOSIS — I959 Hypotension, unspecified: Secondary | ICD-10-CM

## 2022-11-04 DIAGNOSIS — E440 Moderate protein-calorie malnutrition: Secondary | ICD-10-CM | POA: Insufficient documentation

## 2022-11-04 DIAGNOSIS — G9519 Other vascular myelopathies: Secondary | ICD-10-CM | POA: Diagnosis not present

## 2022-11-04 DIAGNOSIS — I6310 Cerebral infarction due to embolism of unspecified precerebral artery: Secondary | ICD-10-CM | POA: Diagnosis not present

## 2022-11-04 DIAGNOSIS — I71019 Dissection of thoracic aorta, unspecified: Secondary | ICD-10-CM | POA: Diagnosis not present

## 2022-11-04 LAB — CBC
HCT: 37.6 % — ABNORMAL LOW (ref 39.0–52.0)
Hemoglobin: 12.4 g/dL — ABNORMAL LOW (ref 13.0–17.0)
MCH: 26.3 pg (ref 26.0–34.0)
MCHC: 33 g/dL (ref 30.0–36.0)
MCV: 79.7 fL — ABNORMAL LOW (ref 80.0–100.0)
Platelets: 234 10*3/uL (ref 150–400)
RBC: 4.72 MIL/uL (ref 4.22–5.81)
RDW: 18.4 % — ABNORMAL HIGH (ref 11.5–15.5)
WBC: 15 10*3/uL — ABNORMAL HIGH (ref 4.0–10.5)
nRBC: 0.3 % — ABNORMAL HIGH (ref 0.0–0.2)

## 2022-11-04 LAB — BASIC METABOLIC PANEL
Anion gap: 10 (ref 5–15)
BUN: 34 mg/dL — ABNORMAL HIGH (ref 8–23)
CO2: 20 mmol/L — ABNORMAL LOW (ref 22–32)
Calcium: 8 mg/dL — ABNORMAL LOW (ref 8.9–10.3)
Chloride: 100 mmol/L (ref 98–111)
Creatinine, Ser: 1.37 mg/dL — ABNORMAL HIGH (ref 0.61–1.24)
GFR, Estimated: 55 mL/min — ABNORMAL LOW (ref >60)
Glucose, Bld: 104 mg/dL — ABNORMAL HIGH (ref 70–99)
Potassium: 4 mmol/L (ref 3.5–5.1)
Sodium: 130 mmol/L — ABNORMAL LOW (ref 135–145)

## 2022-11-04 LAB — GLUCOSE, CAPILLARY
Glucose-Capillary: 106 mg/dL — ABNORMAL HIGH (ref 70–99)
Glucose-Capillary: 84 mg/dL (ref 70–99)
Glucose-Capillary: 84 mg/dL (ref 70–99)
Glucose-Capillary: 87 mg/dL (ref 70–99)
Glucose-Capillary: 91 mg/dL (ref 70–99)
Glucose-Capillary: 98 mg/dL (ref 70–99)

## 2022-11-04 LAB — MAGNESIUM: Magnesium: 2.1 mg/dL (ref 1.7–2.4)

## 2022-11-04 MED ORDER — CEFAZOLIN SODIUM-DEXTROSE 1-4 GM/50ML-% IV SOLN
1.0000 g | Freq: Three times a day (TID) | INTRAVENOUS | Status: DC
  Administered 2022-11-04 – 2022-11-05 (×2): 1 g via INTRAVENOUS
  Filled 2022-11-04 (×2): qty 50

## 2022-11-04 MED ORDER — VITAL HIGH PROTEIN PO LIQD: 1000.0000 mL | ORAL | Status: DC

## 2022-11-04 MED ORDER — VITAL 1.5 CAL PO LIQD
1000.0000 mL | ORAL | Status: DC
  Administered 2022-11-04: 1000 mL
  Filled 2022-11-04 (×2): qty 1000

## 2022-11-04 MED ORDER — MELATONIN 3 MG PO TABS
3.0000 mg | ORAL_TABLET | Freq: Every day | ORAL | Status: DC
  Administered 2022-11-04 – 2022-11-14 (×8): 3 mg via ORAL
  Filled 2022-11-04 (×11): qty 1

## 2022-11-04 MED ORDER — THIAMINE MONONITRATE 100 MG PO TABS
100.0000 mg | ORAL_TABLET | Freq: Every day | ORAL | Status: AC
  Administered 2022-11-04 – 2022-11-08 (×5): 100 mg
  Filled 2022-11-04 (×5): qty 1

## 2022-11-04 MED ORDER — PROSOURCE TF20 ENFIT COMPATIBL EN LIQD
60.0000 mL | Freq: Every day | ENTERAL | Status: DC
  Administered 2022-11-04: 60 mL
  Filled 2022-11-04: qty 60

## 2022-11-04 MED ORDER — PROSOURCE TF20 ENFIT COMPATIBL EN LIQD
60.0000 mL | Freq: Two times a day (BID) | ENTERAL | Status: DC
  Administered 2022-11-04 – 2022-11-06 (×3): 60 mL
  Filled 2022-11-04 (×4): qty 60

## 2022-11-04 MED ORDER — TRAZODONE HCL 50 MG PO TABS
50.0000 mg | ORAL_TABLET | Freq: Every evening | ORAL | Status: DC | PRN
  Administered 2022-11-04 – 2022-11-16 (×2): 50 mg via ORAL
  Filled 2022-11-04 (×2): qty 1

## 2022-11-04 MED ORDER — VITAL 1.5 CAL PO LIQD
1000.0000 mL | ORAL | Status: DC
  Administered 2022-11-04: 1000 mL

## 2022-11-04 MED ORDER — POLYETHYLENE GLYCOL 3350 17 G PO PACK
17.0000 g | PACK | Freq: Every day | ORAL | Status: DC
  Administered 2022-11-04 – 2022-11-18 (×6): 17 g via ORAL
  Filled 2022-11-04 (×11): qty 1

## 2022-11-04 MED ORDER — SENNA 8.6 MG PO TABS
1.0000 | ORAL_TABLET | Freq: Every day | ORAL | Status: DC
  Administered 2022-11-04 – 2022-11-05 (×2): 8.6 mg via ORAL
  Filled 2022-11-04 (×2): qty 1

## 2022-11-04 MED ORDER — BISACODYL 10 MG RE SUPP
10.0000 mg | Freq: Every day | RECTAL | Status: DC | PRN
  Administered 2022-11-04: 10 mg via RECTAL
  Filled 2022-11-04: qty 1

## 2022-11-04 NOTE — Progress Notes (Addendum)
Initial Nutrition Assessment  DOCUMENTATION CODES:   Non-severe (moderate) malnutrition in context of chronic illness  INTERVENTION:   Tube Feeding via Cortrak:  Inititate Vital 1.5 at 20 ml/hr, titrate by 10 mL q 8 hours until goal rate of 60 ml/hr Pro-Source TF20 60 mL BID TF regimen provides 2320 kcals, 137 g of protein and 1094 mL of free water  Thiamine 100 mg via enteral route x 5 days  NUTRITION DIAGNOSIS:   Moderate Malnutrition related to chronic illness (metastatic hepatic carcinoma) as evidenced by moderate muscle depletion, moderate fat depletion.  GOAL:   Patient will meet greater than or equal to 90% of their needs   MONITOR:   PO intake, Supplement acceptance, TF tolerance, Weight trends, Labs  REASON FOR ASSESSMENT:   Consult Enteral/tube feeding initiation and management  ASSESSMENT:   72 yo male admitted with Type B aortic dissection with rupture in thoracic aorta requiring TEVAR. Pt with known stage IV hepatocellular carcinoma with mets to lung; atient is on LUNG Atezolizumab + Bevacizumab Maintenance q21d. PMH includes HTN, OSA, hx prostate cancer (2006)  6/26 Admitted with aortic dissection with rupture-OR for emergent repair 6/27 MRI brain with multiple acute infarcts with embolic pattern, MRI C-spine with cord ischemia 6/28 Lumbar drain placed 7/01 Cortrak placed (gastric)  Off pressors. Pt is alert, appears oriented; ntoed pt reporting visual hallucinations this AM  Appetite has been poor, not really eating much. Lunch tray at bedside and pt states he is not hungry for it and wants to save for his wife. Pt reports food is very off-putting and he cannot even seem to force himself to eat at times. Pt has been trying some Ensure/Boost but not often. Pt is trying to drink Ensure here  Admit wt 85 kg (187 pounds); current wt 94 kg . Net + only 1.5 L per I/O flowsheet. Pt reports UBW around 215 pounds in April with  wt loss down to 180 pounds 15% wt  loss in 3 months but unsure of current dry weight  UOP 2650 mL in 24 hours Lumbar drain with 85 mL  No BM since 6/26, +flatus; abdomen is distended, somewhat firm. Pt denies nausea, reports abdomen feels full but no pain or significant discomfort. Noted pt receiving senna, miralax, colace, dulcolax suppository  Labs: sodium 130, BUN 34, Creatinine 1.37 Meds: LR at 75 ml/hr, ss novolog   NUTRITION - FOCUSED PHYSICAL EXAM:   Flowsheet Row Most Recent Value  Orbital Region Moderate depletion  Upper Arm Region Moderate depletion  Thoracic and Lumbar Region Moderate depletion  Buccal Region Moderate depletion  Temple Region Severe depletion  Clavicle Bone Region Moderate depletion  Clavicle and Acromion Bone Region Moderate depletion  Scapular Bone Region Moderate depletion  Dorsal Hand Moderate depletion  Patellar Region Unable to assess  Anterior Thigh Region Unable to assess  Posterior Calf Region Unable to assess  Edema (RD Assessment) Mild       Diet Order:   Diet Order             Diet regular Room service appropriate? Yes; Fluid consistency: Thin  Diet effective now                   EDUCATION NEEDS:   Education needs have been addressed  Skin:  Skin Assessment: Reviewed RN Assessment  Last BM:  6/26  Height:   Ht Readings from Last 1 Encounters:  10/30/22 6\' 3"  (1.905 m)    Weight:   Wt Readings  from Last 1 Encounters:  11/04/22 (P) 92.8 kg     BMI:  Body mass index is 25.57 kg/m (pended).  Estimated Nutritional Needs:   Kcal:  2200-2500 kcals  Protein:  125-150 g  Fluid:  1.8 L    Romelle Starcher MS, RDN, LDN, CNSC Registered Dietitian 3 Clinical Nutrition RD Pager and On-Call Pager Number Located in [place]

## 2022-11-04 NOTE — Progress Notes (Signed)
NAME:  Mathew [name], MRN:  [id number], DOB:  1950-05-14, LOS: 5 ADMISSION DATE:  10/30/2022, CONSULTATION DATE:  6/26 REFERRING MD:  Dr. Myra Gianotti , CHIEF COMPLAINT:  Ruptured aortic aneurysm   History of Present Illness:  72 year old male with past medical history as below, which is significant for hepatocellular carcinoma on chemotherapy, paroxysmal atrial fibrillation, hypertension, and hepatitis C status post treatment.  He initially presented to [facility] emergency department on 6/21 with complaints of sudden onset chest pain and was diagnosed with thrombosed type B thoracic aortic dissection extending from the distal aortic arch to the level of the diaphragm.  He was admitted to the ICU for tight hemodynamic control and was ultimately discharged on 6/25 after having no expansion of the dissection.  He now presents again on 6/26 with the recurrence of chest pain.  CT angiogram in the emergency department demonstrated significant progression of the aortic dissection with focal area of active extravasation.  Vascular surgery was consulted and took the patient emergently to the operating room for treatment of aortic rupture with plans for endovascular repair.  PCCM was asked to admit to ICU postoperatively.  The L subclavian was covered with the stenting and surgery was able to provide outlet however states that the pressures in the L to R will still likely vary and the R extremity should be utilized for BP control.    Ccm has been asked to admit for medical management and BP control post operatively.   Pertinent  Medical History   has a past medical history of Hepatitis C, Hypertension, Obstructive sleep apnea, Premature ventricular contraction, and Prostate cancer (HCC) (2006).  Significant Hospital Events: Including procedures, antibiotic start and stop dates in addition to other pertinent events   6/25 Discharged with Type B Ao dissection with intramural thrombosis  6/26 Returned for  admittance  with ascending Ao rupture. Emergent OR for repair 6/27 patient developed left-sided weakness, MRI brain noted for multiple acute infarcts with embolic pattern, MRI C-spine with cord ischemia on the right side.   6/28 Lumbar drain placed 6/30 started on vasopressor support to maintain SBP greater than 140 7/1 vascular continue plans to continue lumbar drain per vascular  Interim History / Subjective:  Seen lying in bed with flat affect Now on 5 L nasal cannula Pressors stopped this a.m.  Objective   Blood pressure (!) 146/68, pulse 95, temperature 98.5 F (36.9 C), temperature source Oral, resp. rate 19, height 6\' 3"  (1.905 m), weight (P) 92.8 kg, SpO2 95 %.        Intake/Output Summary (Last 24 hours) at 11/04/2022 0830 Last data filed at 11/04/2022 0600 Gross per 24 hour  Intake 1961.3 ml  Output 2735 ml  Net -773.7 ml    Filed Weights   11/02/22 0500 11/03/22 0500 11/04/22 0645  Weight: 93.3 kg 94 kg (P) 92.8 kg    Examination: General: Acute on chronic ill-appearing elderly man lying in bed in no acute distress HEENT: [place]/AT, MM pink/moist, PERRL,  Neuro: Oriented x 3, flat affect, persistent left lower extremity weakness CV: s1s2 regular rate and rhythm, no murmur, rubs, or gallops,  PULM: Diminished breath bilaterally, faint crackles to right lower lobe, on 5 L nasal cannula GI: soft, bowel sounds active in all 4 quadrants, non-tender, non-distended, tolerating oral diet Extremities: warm/dry, no edema  Skin: no rashes or lesions'  Resolved Hospital Problem list     Assessment & Plan:  Ruptured type B aortic dissection s/p emergent endovascular repair -6/26 >  L subclavian was covered during surgery -6/28 CT angiogram showed stable repaired segment of aorta P: Primary management per vascular surgery SBP goal greater than 140 Ensure adequate pain control Continuous telemetry Maintain ICU status for close monitoring of hemodynamics  Acute bilateral  multifocal embolic ischemic stroke -Brain MRI which showed bilateral multifocal embolic ischemic stroke involving right cerebellar splenium of the corpus callosum on the left, right occipital lobe, right parietal lobe, and left lentiform nucleus Cervical spinal cord edema/infarct -Cervical spine MRI showing spinal cord signal in the C6-C7, could be due to edema versus stroke-Ischemic stroke of brain does not explain his left leg weakness, this could be related to spinal cord infarction -Lumbar drain remains in place draining clear CSF Visual hallucinations -A.m. 7/1 patient reports visual hallucinations, states he is seeing people he knows that are not in the room and "black spots" in peripheral vision P: Stroke team following, appreciate assistance Vasopressor support as above Secondary stroke prevention Neurochecks Will need repeat MRI thoracic spine With new onset hallucinations may need to consider MRI brain as well Initiation of antithrombotics per neuro/vascular surgery  Acute kidney injury -improving Hyponatremia NAG acidosis P: Follow renal function  Monitor urine output Trend Bmet Avoid nephrotoxins Ensure adequate renal perfusion  Stop IV hydration in favor for enteral hydration  Hypertension HLD P: Holding home antihypertensives with goal to keep SBP 140-160 Continue statin  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  Await repeat MRI spine and brain prior to repeat goals of care discussion  Best Practice (right click and "Reselect all SmartList Selections" daily)   Diet/type: Regular consistency (see orders) > advance as tolerated DVT prophylaxis: prophylactic  heparin  GI prophylaxis: N/A Lines: NA Foley:  N/A Code Status:  full code Last date of multidisciplinary goals of care discussion Patient and his family updated at bedside, decision was to continue full scope of care as of 7/1  CRITICAL CARE Performed by: Shigeo Baugh D. Harris  Total critical care time: 38 minutes  Critical care time was exclusive of separately billable procedures and treating other patients.  Critical care was necessary to treat or prevent imminent or life-threatening deterioration.  Critical care was time spent personally by me on the following activities: development of treatment plan with patient and/or surrogate as well as nursing, discussions with consultants, evaluation of patient's response to treatment, examination of patient, obtaining history from patient or surrogate, ordering and performing treatments and interventions, ordering and review of laboratory studies, ordering and review of radiographic studies, pulse oximetry and re-evaluation of patient's condition.  Aniyiah Zell D. Harris, NP-C [facility] Personal contact information can be found on Amion  If no contact or response made please call [phone] 11/04/2022, 8:50 AM

## 2022-11-04 NOTE — Anesthesia Post-op Follow-up Note (Signed)
  Anesthesia Pain Follow-up Note  Patient: Mathew [name]  Day #: 3  Date of Follow-up: 11/04/2022 Time: 1:20 PM  Last Vitals:  Vitals:   11/04/22 1215 11/04/22 1230  BP: (!) 183/109   Pulse: 99 97  Resp: (!) 24 19  Temp:    SpO2: 94% 93%    Level of Consciousness: alert  Pain: none   Side Effects:None  Catheter Site Exam:clean  Anti-Coag Meds (From admission, onward)    Start     Dose/Rate Route Frequency Ordered Stop   11/02/22 1400  heparin injection 5,000 Units        5,000 Units Subcutaneous Every 8 hours 11/02/22 1610          Plan: Continue current therapy of postop epidural at surgeon's request  Henny Strauch Cath to be pulled on 7/2

## 2022-11-04 NOTE — Procedures (Signed)
Cortrak  Person Inserting Tube:  Rylan Bernard D, RD Tube Type:  Cortrak - 43 inches Tube Size:  10 Tube Location:  Left nare Secured by: Bridle Technique Used to Measure Tube Placement:  Marking at nare/corner of mouth Cortrak Secured At:  65 cm Procedure Comments:  Cortrak Tube Team Note:  Consult received to place a Cortrak feeding tube.   X-ray is required, abdominal x-ray has been ordered by the Cortrak team. Please confirm tube placement before using the Cortrak tube.   If the tube becomes dislodged please keep the tube and contact the Cortrak team at [email or web address] for replacement.  If after hours and replacement cannot be delayed, place a NG tube and confirm placement with an abdominal x-ray.    Preciliano Castell, RD, LDN Clinical Dietitian RD pager # available in AMION  After hours/weekend pager # available in AMION    

## 2022-11-04 NOTE — Progress Notes (Addendum)
  Progress Note    11/04/2022 6:32 AM 5 Days Post-Op  Subjective:  still with lower extremity weakness.  No better, no worse.    Tm 99 now afebrile HR 90's-100's  130's-150's systolic 93% 4LO2NC  Gtts: Levophed  Vitals:   11/04/22 0615 11/04/22 0630  BP: (!) 149/87 (!) 146/79  Pulse: 95 94  Resp: 15 16  Temp:    SpO2: 92% 93%    Physical Exam: General:  no distress Cardiac:  regular Lungs:  non labored Extremities:  palpable DP pulses bilaterally; left radial is easily palpable.  Bandage in place right wrist.   Abdomen:  soft, NT  CBC    Component Value Date/Time   WBC 15.0 (H) 11/04/2022 0413   RBC 4.72 11/04/2022 0413   HGB 12.4 (L) 11/04/2022 0413   HGB 18.4 (H) 10/23/2022 1417   HCT 37.6 (L) 11/04/2022 0413   PLT 234 11/04/2022 0413   PLT 238 10/23/2022 1417   MCV 79.7 (L) 11/04/2022 0413   MCH 26.3 11/04/2022 0413   MCHC 33.0 11/04/2022 0413   RDW 18.4 (H) 11/04/2022 0413   LYMPHSABS 3.0 10/23/2022 1417   MONOABS 0.7 10/23/2022 1417   EOSABS 0.1 10/23/2022 1417   BASOSABS 0.2 (H) 10/23/2022 1417    BMET    Component Value Date/Time   NA 130 (L) 11/04/2022 0413   K 4.0 11/04/2022 0413   CL 100 11/04/2022 0413   CO2 20 (L) 11/04/2022 0413   GLUCOSE 104 (H) 11/04/2022 0413   BUN 34 (H) 11/04/2022 0413   CREATININE 1.37 (H) 11/04/2022 0413   CREATININE 1.25 (H) 10/23/2022 1417   CALCIUM 8.0 (L) 11/04/2022 0413   GFRNONAA 55 (L) 11/04/2022 0413   GFRNONAA >60 10/23/2022 1417   GFRAA >60 06/12/2018 0815    INR    Component Value Date/Time   INR 1.3 (H) 11/01/2022 1551     Intake/Output Summary (Last 24 hours) at 11/04/2022 1914 Last data filed at 11/04/2022 0600 Gross per 24 hour  Intake 2111.3 ml  Output 2735 ml  Net -623.7 ml      Assessment/Plan:  72 y.o. male is s/p:  emergent repair of ruptured left thoracic aortic aneurysm with endovascular stent graft and laser fenestration and subsequent stenting to the left subclavian artery    5 Days Post-Op   -pt continues to have palpable distal pulses.  Her has persistent leg weakness that is unchanged with left worse than right.  Dr. Myra Gianotti plans to leave drain in one more day.   -creatinine continues to improve -acute blood loss anemia-hgb slowly drifted down over past couple of days:  14.1>13.4>12.4 -on Ancef while spinal drain is in place -DVT prophylaxis:  sq heparin   Doreatha Massed, PA-C [facility] [phone] 11/04/2022 6:32 AM  I agree with the above.  Now getting tube feeds.  Neurologic exam unchanged.  I will plan on capping his lumbar drain tomorrow if there are no significant changes.  He will be monitored for 24 hours and then removed.  Target blood pressure remains 140-160.  Lumbar drain has not needed to be drained today.  If that trend continues, I will likely remove it tomorrow.  I will stop his sq heparin after the 10 pm dose    [place] Mikhayla Phillis

## 2022-11-04 NOTE — Progress Notes (Signed)
OT Cancellation Note  Patient Details Name: Mathew [name] MRN: [id number] DOB: 03/26/1951   Cancelled Treatment:    Reason Eval/Treat Not Completed: Active bedrest order until drain moved, planned for 7/2. Will continue to follow.  Evern Bio 11/04/2022, 8:23 AM Berna Spare, OTR/L Acute Rehabilitation Services Office: [phone]

## 2022-11-04 NOTE — Progress Notes (Signed)
STROKE TEAM PROGRESS NOTE   BRIEF HPI [name] is a 72 y.o. 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  He was found to have a ruptured type B thoracic aortic dissection. He initially presented 6/21 to the ED with chest pain and EKG concerning for possible ST elevation. He eventually underwent CT C/A/P which revealed thr type B aortic dissection extending from the distal aortic arch just beyond the L subclavian artery to the level of the diaphragm with intramural thrombosis. His repeat scans were stable at that time and he was discharged 6/25 with antihypertensives and pain medications.  He returned the following day (6/26) and was taken emergently to the OR due to severe progression of his dissection with active extravasation consistent with rupture.  Postop day 1 he was found to have left-sided weakness/possible bilateral weakness which is concerning for cord ischemia versus CVA and stat brain MRI shows punctate acute infarcts in the right cerebellum, splenium of the corpus callosum on the left, right occipital lobe, right parietal lobe, and left lentiform nucleus.      SIGNIFICANT HOSPITAL EVENTS 6/26-admitted and had an emergent thoracic aortic dissection repair with vascular surgery 6/28 - neurology consulted for left lower extremity weakness-MRI brain shows punctate acute infarcts 6/28-lumbar drain placed   INTERM HISTORY/SUBJECTIVE A lady family member is at the bedside.  Continues  to have diminished  temp and pin prick sensation at his nipple line down to sharp sensation bilaterally.    Bilateral lower extremity weakness left worse than the right persists.  A lumbar drain continues to be in place.  He  remains on vasopressors this morning to boost blood pressure..  Denies back pain.       OBJECTIVE  CBC    Component Value Date/Time   WBC 15.0 (H) 11/04/2022 0413   RBC 4.72 11/04/2022 0413   HGB 12.4 (L) 11/04/2022 0413   HGB 18.4 (H) 10/23/2022 1417   HCT 37.6 (L) 11/04/2022 0413   PLT 234 11/04/2022 0413   PLT 238 10/23/2022 1417   MCV 79.7 (L) 11/04/2022 0413   MCH 26.3 11/04/2022 0413   MCHC 33.0 11/04/2022 0413   RDW 18.4 (H) 11/04/2022 0413   LYMPHSABS 3.0 10/23/2022 1417   MONOABS 0.7 10/23/2022 1417   EOSABS 0.1 10/23/2022 1417   BASOSABS 0.2 (H) 10/23/2022 1417    BMET    Component Value Date/Time   NA 130 (L) 11/04/2022 0413   K 4.0 11/04/2022 0413   CL 100 11/04/2022 0413   CO2 20 (L) 11/04/2022 0413   GLUCOSE 104 (H) 11/04/2022 0413   BUN 34 (H) 11/04/2022 0413   CREATININE 1.37 (H) 11/04/2022 0413   CREATININE 1.25 (H) 10/23/2022 1417   CALCIUM 8.0 (L) 11/04/2022 0413   GFRNONAA 55 (L) 11/04/2022 0413   GFRNONAA >60 10/23/2022 1417    IMAGING past 24 hours No results found.  Vitals:   11/04/22 1145 11/04/22 1200 11/04/22 1215 11/04/22 1230  BP: (!) 105/57 (!) 155/90 (!) 183/109   Pulse: 98 95 99 97  Resp: 16 15 (!) 24 19  Temp:      TempSrc:      SpO2: 93% 93% 94% 93%  Weight:      Height:         PHYSICAL EXAM General:  Alert, well-nourished, well-developed patient in no  acute distress Psych:  Mood and affect appropriate for situation CV: Regular rate and rhythm on monitor Respiratory:  Regular, unlabored respirations on room air GI: Abdomen soft and nontender   NEURO:  Mental Status: AA&Ox3, patient is able to give clear and coherent history Speech/Language: speech is without dysarthria or aphasia.  Naming, repetition, fluency, and comprehension intact.  Cranial Nerves:  II: PERRL. Visual fields full.  III, IV, VI: EOMI. Eyelids elevate symmetrically.  V: Sensation is intact to light touch and symmetrical to face.  VII: Face is symmetrical resting and smiling VIII: hearing intact to voice. IX, X: Palate  elevates symmetrically. Phonation is normal.  [id number] shrug 5/5. XII: tongue is midline without fasciculations. Motor: Bilateral upper extremities with full strength trace left triceps weakness Bilateral lower extremity weakness 2-3/5 left worse than right.  Hip flexion weakness greater than extension.  Ankle dorsiflexors are weaker than plantarflexors.  He has good strength in hip adductor's and abductor's but knee extensors are weaker bilaterally Tone: is normal and bulk is normal Sensation-diminished sensation to sharp and dull sensation below the level of the nipple bilaterally, endorses normal sensation in the face, arms, and shoulders Coordination: FTN intact bilaterally, unable to perform HKS Gait- deferred   ASSESSMENT/PLAN  Acute Ischemic Infarct: Scattered punctate infarcts in the bilateral hemispheres and spinal cord infarct suspected thoracic level.  Etiology unclear from dissection or from endovascular repair Etiology: Postoperative aortic dissection repair Code Stroke CT head No acute abnormality.  CTA head & neck no acute intracranial process, no intracranial large vessel occlusion or significant stenosis MRI  punctate acute infarcts in the R cerebellum, splenium of the corpus callosum on the L, R occipital lobe, R parietal lobe, and L lentiform nucleus  He did have an intraoperative TEE, however I am unable to see the report LDL 137 HgbA1c 5.7 VTE prophylaxis -subcu heparin No antithrombotic prior to admission, now on No antithrombotic will need to be discussed with vascular surgery and CCM prior to initiating antiplatelet or anticoagulant therapy Therapy recommendations: Pending  disposition: Pending  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 Spinal cord hyperintense lesion at C6 unlikely to explain his significant paraparesis.  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 not visualized on MRI due to metallic artifacts from endovascular aneurysm repair  lumbar drain placed 6/28 with  orders to drain 10-30 cc/h  Type B aortic dissection 6/26- S/p 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 with coverage of the L subclavian artery and subsequent lasar fenestration at the L subclavian artery  Paroxysmal atrial fibrillation not on anticoagulation  Hypertension Home meds: Losartan, isosorbide bide mononitrate, spironolactone Stable Per vascular surgery-MAP greater than 90  Hyperlipidemia Home meds: Atorvastatin 80 mg, resumed in hospital LDL 137, goal < 70 Continue statin at discharge  Other Active Problems Hepatobiliary carcinoma with metastasis to the lung Hepatitis C -treated Prostate cancer status post prostatectomy  Hospital day # 5  Patient presented with dissecting aortic aneurysm and had 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 with coverage of the L subclavian artery and subsequent laser fenestration at the L subclavian artery.  He developed bilateral leg weakness with MRI showing small bilateral embolic infarcts which do not explain the degree of leg weakness.  MRI of cervical spine shows spinal cord hyperintensities which could be spinal cord infarct but cannot explain convincingly bilateral lower extremity weakness and sensory level on the trunk at the nipple level.Marland Kitchen  MRI scan of the thoracic spine unfortunately is not a good diagnostic quality but  the patient's clinical exam does suggest sensory loss from the chest down with asymmetric bilateral leg weakness which does suggest thoracic spinal cord infarct.  Patient has had a lumbar drain placed 2 days ago with plan for permissive hypertension to hopefully reduce spinal cord injury.  Long discussion with patient and 2 lady family members at the bedside and answered questions.  Discussed with Dr. Merrily Pew. and Dr. Kathrine Cords augmentation of blood pressure with low-dose pressors and discontinue lumbar drain as per vascular surgery team.    This patient is critically ill and  at significant risk of neurological worsening, death and care requires constant monitoring of vital signs, hemodynamics,respiratory and cardiac monitoring, extensive review of multiple databases, frequent neurological assessment, discussion with family, other specialists and medical decision making of high complexity.I have made any additions or clarifications directly to the above note.This critical care time does not reflect procedure time, or teaching time or supervisory time of PA/NP/Med Resident etc but could involve care discussion time.  I spent 30 minutes of neurocritical care time  in the care of  this patient.            Delia Heady, MD Medical Director [facility] Pager: [phone] 11/04/2022 12:42 PM  To contact Stroke Continuity provider, please refer to [email or web address]. After hours, contact General Neurology

## 2022-11-04 NOTE — Progress Notes (Signed)
PT Cancellation Note  Patient Details Name: Mathew [name] MRN: [id number] DOB: [birth year]   Cancelled Treatment:    Reason Eval/Treat Not Completed: Active bedrest order. Pt continues to have lumbar drain in place with plans to remove tomorrow, therefore pt remains on bedrest. Acute PT to return as able, as appropriate to complete PT eval.  Lewis Shock, PT, DPT Acute Rehabilitation Services Secure chat preferred Office #: [phone]    Iona Hansen 11/04/2022, 8:21 AM

## 2022-11-04 DEATH — deceased

## 2022-11-05 ENCOUNTER — Inpatient Hospital Stay (HOSPITAL_COMMUNITY): Payer: BC Managed Care – PPO

## 2022-11-05 DIAGNOSIS — I7113 Aneurysm of the descending thoracic aorta, ruptured: Secondary | ICD-10-CM | POA: Diagnosis not present

## 2022-11-05 DIAGNOSIS — G9341 Metabolic encephalopathy: Secondary | ICD-10-CM | POA: Diagnosis not present

## 2022-11-05 DIAGNOSIS — I71019 Dissection of thoracic aorta, unspecified: Secondary | ICD-10-CM | POA: Diagnosis not present

## 2022-11-05 DIAGNOSIS — I6310 Cerebral infarction due to embolism of unspecified precerebral artery: Secondary | ICD-10-CM | POA: Diagnosis not present

## 2022-11-05 LAB — CBC
HCT: 38 % — ABNORMAL LOW (ref 39.0–52.0)
Hemoglobin: 12.6 g/dL — ABNORMAL LOW (ref 13.0–17.0)
MCH: 26.9 pg (ref 26.0–34.0)
MCHC: 33.2 g/dL (ref 30.0–36.0)
MCV: 81 fL (ref 80.0–100.0)
Platelets: 260 10*3/uL (ref 150–400)
RBC: 4.69 MIL/uL (ref 4.22–5.81)
RDW: 18.6 % — ABNORMAL HIGH (ref 11.5–15.5)
WBC: 14.5 10*3/uL — ABNORMAL HIGH (ref 4.0–10.5)
nRBC: 0.3 % — ABNORMAL HIGH (ref 0.0–0.2)

## 2022-11-05 LAB — BLOOD GAS, VENOUS
Acid-Base Excess: 0.8 mmol/L (ref 0.0–2.0)
Bicarbonate: 25.3 mmol/L (ref 20.0–28.0)
Drawn by: 7917
O2 Saturation: 77.1 %
Patient temperature: 37
pCO2, Ven: 39 mmHg — ABNORMAL LOW (ref 44–60)
pH, Ven: 7.42 (ref 7.25–7.43)
pO2, Ven: 45 mmHg (ref 32–45)

## 2022-11-05 LAB — BASIC METABOLIC PANEL
Anion gap: 11 (ref 5–15)
BUN: 31 mg/dL — ABNORMAL HIGH (ref 8–23)
CO2: 20 mmol/L — ABNORMAL LOW (ref 22–32)
Calcium: 8.1 mg/dL — ABNORMAL LOW (ref 8.9–10.3)
Chloride: 99 mmol/L (ref 98–111)
Creatinine, Ser: 1.19 mg/dL (ref 0.61–1.24)
GFR, Estimated: >60 mL/min (ref >60)
Glucose, Bld: 83 mg/dL (ref 70–99)
Potassium: 4.3 mmol/L (ref 3.5–5.1)
Sodium: 130 mmol/L — ABNORMAL LOW (ref 135–145)

## 2022-11-05 LAB — GLUCOSE, CAPILLARY
Glucose-Capillary: 86 mg/dL (ref 70–99)
Glucose-Capillary: 102 mg/dL — ABNORMAL HIGH (ref 70–99)
Glucose-Capillary: 131 mg/dL — ABNORMAL HIGH (ref 70–99)
Glucose-Capillary: 89 mg/dL (ref 70–99)
Glucose-Capillary: 99 mg/dL (ref 70–99)

## 2022-11-05 LAB — MAGNESIUM: Magnesium: 1.9 mg/dL (ref 1.7–2.4)

## 2022-11-05 LAB — PHOSPHORUS: Phosphorus: 2.8 mg/dL (ref 2.5–4.6)

## 2022-11-05 LAB — AMMONIA: Ammonia: 25 umol/L (ref 9–35)

## 2022-11-05 MED ORDER — METHYLNALTREXONE BROMIDE 12 MG/0.6ML ~~LOC~~ SOLN
12.0000 mg | Freq: Once | SUBCUTANEOUS | Status: AC
  Administered 2022-11-05: 12 mg via SUBCUTANEOUS
  Filled 2022-11-05: qty 0.6

## 2022-11-05 MED ORDER — FUROSEMIDE 10 MG/ML IJ SOLN
40.0000 mg | Freq: Once | INTRAMUSCULAR | Status: AC
  Administered 2022-11-05: 40 mg via INTRAVENOUS
  Filled 2022-11-05: qty 4

## 2022-11-05 MED ORDER — SENNA 8.6 MG PO TABS: 1.0000 | ORAL_TABLET | Freq: Every day | ORAL | Status: DC | PRN

## 2022-11-05 MED ORDER — MAGNESIUM SULFATE 2 GM/50ML IV SOLN
2.0000 g | Freq: Once | INTRAVENOUS | Status: AC
  Administered 2022-11-05: 2 g via INTRAVENOUS
  Filled 2022-11-05: qty 50

## 2022-11-05 MED ORDER — FREE WATER
200.0000 mL | Status: DC
  Administered 2022-11-05 (×2): 200 mL

## 2022-11-05 MED ORDER — CEFAZOLIN SODIUM-DEXTROSE 1-4 GM/50ML-% IV SOLN
1.0000 g | Freq: Three times a day (TID) | INTRAVENOUS | Status: DC
  Administered 2022-11-05: 1 g via INTRAVENOUS
  Filled 2022-11-05: qty 50

## 2022-11-05 NOTE — Progress Notes (Signed)
Inpatient Rehab Admissions Coordinator:   Per therapy recommendations, patient was screened for CIR candidacy by Megan Salon, MS, CCC-SLP . At this time, Pt. is not able to achieve full standing even with max A for 2-3, and I do not think he is at a level to tolerate the intensity of CIR; however,  Pt. may have potential to progress to becoming a potential CIR candidate, so CIR admissions team will follow and monitor for progress and participation with therapies and place consult order if Pt. appears to be an appropriate candidate. Please contact me with any questions.   Megan Salon, MS, CCC-SLP Rehab Admissions Coordinator  [phone] (celll) [phone] (office)

## 2022-11-05 NOTE — Progress Notes (Addendum)
  Progress Note    11/05/2022 6:45 AM 6 Days Post-Op  Subjective:  says his legs are feeling a bit better  Afebrile HR 90's-100's 120's-170's systolic 92% 2LO2NC  Gtts:  none  Vitals:   11/05/22 0000 11/05/22 0400  BP: 138/85   Pulse: 96   Resp: 20   Temp:  98.1 F (36.7 C)  SpO2: 93%     Physical Exam: General:  no distress Cardiac:  regular Lungs:  non labored Incisions:  groin is soft without hematoma Extremities:  palpable DP pulses bilaterally; able to lift both legs off the bed.  Palpable radial pulses bilaterally Abdomen:  soft  CBC    Component Value Date/Time   WBC 14.5 (H) 11/05/2022 0359   RBC 4.69 11/05/2022 0359   HGB 12.6 (L) 11/05/2022 0359   HGB 18.4 (H) 10/23/2022 1417   HCT 38.0 (L) 11/05/2022 0359   PLT 260 11/05/2022 0359   PLT 238 10/23/2022 1417   MCV 81.0 11/05/2022 0359   MCH 26.9 11/05/2022 0359   MCHC 33.2 11/05/2022 0359   RDW 18.6 (H) 11/05/2022 0359   LYMPHSABS 3.0 10/23/2022 1417   MONOABS 0.7 10/23/2022 1417   EOSABS 0.1 10/23/2022 1417   BASOSABS 0.2 (H) 10/23/2022 1417    BMET    Component Value Date/Time   NA 130 (L) 11/05/2022 0359   K 4.3 11/05/2022 0359   CL 99 11/05/2022 0359   CO2 20 (L) 11/05/2022 0359   GLUCOSE 83 11/05/2022 0359   BUN 31 (H) 11/05/2022 0359   CREATININE 1.19 11/05/2022 0359   CREATININE 1.25 (H) 10/23/2022 1417   CALCIUM 8.1 (L) 11/05/2022 0359   GFRNONAA >60 11/05/2022 0359   GFRNONAA >60 10/23/2022 1417   GFRAA >60 06/12/2018 0815    INR    Component Value Date/Time   INR 1.3 (H) 11/01/2022 1551     Intake/Output Summary (Last 24 hours) at 11/05/2022 0645 Last data filed at 11/05/2022 0600 Gross per 24 hour  Intake 2498.72 ml  Output 2050 ml  Net 448.72 ml      Assessment/Plan:  72 y.o. 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  Groin soft without hematoma.   Subjectively, his legs are feeling better.  He is able to lift both off the bed.  Most likely drain out today.   -acute blood loss anemia stable from yesterday at 12.6 today -DVT prophylaxis:  sq heparin-last dose at 2030 last evening in anticipation of removing spinal drain today.  Should be able to restart 8 hours after drain removed.  -spinal drain to be removed by anesthesia today-will continue Ancef x 3 more doses then discontinue.  This order has been placed.  -will need PT/OT this afternoon after spinal drain removed.     Doreatha Massed, PA-C [facility] [phone] 11/05/2022 6:45 AM   I agree with the above.  I have seen and evaluated the patient.  Spinal drain to be removed today.  Continue with therapy and mobilization.  Will need anticoagulation for atrial fibrillation.  Durene Cal

## 2022-11-05 NOTE — Progress Notes (Signed)
OT Cancellation Note  Patient Details Name: Mathew [name] MRN: [id number] DOB: 30-Dec-1950   Cancelled Treatment:    Reason Eval/Treat Not Completed: Active bedrest order (plan for drain to be removed today, will follow up as appropriate/as schedule permits)  Carver Fila, OTD, OTR/L SecureChat Preferred Acute Rehab [phone]   Dalphine Handing 11/05/2022, 7:48 AM

## 2022-11-05 NOTE — Progress Notes (Signed)
[facility] ADULT ICU REPLACEMENT PROTOCOL   The patient does apply for the [facility] Adult ICU Electrolyte Replacment Protocol based on the criteria listed below:   1.Exclusion criteria: TCTS, ECMO, Dialysis, and Myasthenia Gravis patients 2. Is GFR >/= 30 ml/min? Yes.    Patient's GFR today is >60 3. Is SCr </= 2? Yes.   Patient's SCr is 1.19 mg/dL 4. Did SCr increase >/= 0.5 in 24 hours? No. 5.Pt's weight >40kg  Yes.   6. Abnormal electrolyte(s): Mag 1.9  7. Electrolytes replaced per protocol 8.  Call MD STAT for K+ </= 2.5, Phos </= 1, or Mag </= 1 Physician:  Dr. Vladimir Faster  Lolita Lenz 11/05/2022 5:15 AM

## 2022-11-05 NOTE — Progress Notes (Signed)
STROKE TEAM PROGRESS NOTE   BRIEF HPI [name] is a 72 y.o. 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  He was found to have a ruptured type B thoracic aortic dissection. He initially presented 6/21 to the ED with chest pain and EKG concerning for possible ST elevation. He eventually underwent CT C/A/P which revealed thr type B aortic dissection extending from the distal aortic arch just beyond the L subclavian artery to the level of the diaphragm with intramural thrombosis. His repeat scans were stable at that time and he was discharged 6/25 with antihypertensives and pain medications.  He returned the following day (6/26) and was taken emergently to the OR due to severe progression of his dissection with active extravasation consistent with rupture.  Postop day 1 he was found to have left-sided weakness/possible bilateral weakness which is concerning for cord ischemia versus CVA and stat brain MRI shows punctate acute infarcts in the right cerebellum, splenium of the corpus callosum on the left, right occipital lobe, right parietal lobe, and left lentiform nucleus.      SIGNIFICANT HOSPITAL EVENTS 6/26-admitted and had an emergent thoracic aortic dissection repair with vascular surgery 6/28 - neurology consulted for left lower extremity weakness-MRI brain shows punctate acute infarcts 6/28-lumbar drain placed   INTERM HISTORY/SUBJECTIVE 2 lady  family members are at the bedside.  Continues  to have diminished  temp and pin prick sensation at his nipple line down to sharp sensation bilaterally.    Bilateral lower extremity weakness left worse than the right persists but right-sided strength seems slightly improved today.  A lumbar drain continues to be in place.   Vital signs stable.  Plan is to  DC lumbar drain today   OBJECTIVE  CBC    Component Value Date/Time   WBC 14.5 (H) 11/05/2022 0359   RBC 4.69 11/05/2022 0359   HGB 12.6 (L) 11/05/2022 0359   HGB 18.4 (H) 10/23/2022 1417   HCT 38.0 (L) 11/05/2022 0359   PLT 260 11/05/2022 0359   PLT 238 10/23/2022 1417   MCV 81.0 11/05/2022 0359   MCH 26.9 11/05/2022 0359   MCHC 33.2 11/05/2022 0359   RDW 18.6 (H) 11/05/2022 0359   LYMPHSABS 3.0 10/23/2022 1417   MONOABS 0.7 10/23/2022 1417   EOSABS 0.1 10/23/2022 1417   BASOSABS 0.2 (H) 10/23/2022 1417    BMET    Component Value Date/Time   NA 130 (L) 11/05/2022 0359   K 4.3 11/05/2022 0359   CL 99 11/05/2022 0359   CO2 20 (L) 11/05/2022 0359   GLUCOSE 83 11/05/2022 0359   BUN 31 (H) 11/05/2022 0359   CREATININE 1.19 11/05/2022 0359   CREATININE 1.25 (H) 10/23/2022 1417   CALCIUM 8.1 (L) 11/05/2022 0359   GFRNONAA >60 11/05/2022 0359   GFRNONAA >60 10/23/2022 1417    IMAGING past 24 hours DG CHEST PORT 1 VIEW  Result Date: 11/05/2022 CLINICAL DATA:  Shortness of breath EXAM: PORTABLE CHEST 1 VIEW COMPARISON:  10/30/2022 FINDINGS: Redemonstrated endoluminal stent within the aortic arch and descending thoracic aorta. Unchanged cardiac and mediastinal contours. Redemonstrated interstitial and ground-glass opacities. Likely trace right pleural effusion. No definite left pleural effusion. No pneumothorax. No acute osseous abnormality. Enteric tube terminates in the stomach. IMPRESSION: 1. Unchanged appearance of endoluminal stent and cardiomediastinal contours. 2. Redemonstrated interstitial and ground-glass opacities,  possibly mild pulmonary edema. Likely trace right pleural effusion. 3. Enteric tube terminates in the stomach. Electronically Signed   By: Wiliam Ke M.D.   On: 11/05/2022 13:09    Vitals:   11/05/22 0751 11/05/22 1125 11/05/22 1130 11/05/22 1200  BP:   (!) 151/84 (!) 148/77  Pulse:   95 96  Resp:   17 19  Temp: 97.9 F (36.6 C)  97.6 F (36.4 C)    TempSrc:   Oral   SpO2:  95% 95% 94%  Weight:      Height:         PHYSICAL EXAM General:  Alert, well-nourished, well-developed patient in no acute distress Psych:  Mood and affect appropriate for situation CV: Regular rate and rhythm on monitor Respiratory:  Regular, unlabored respirations on room air GI: Abdomen soft and nontender   NEURO:  Mental Status: AA&Ox3, patient is able to give clear and coherent history Speech/Language: speech is without dysarthria or aphasia.  Naming, repetition, fluency, and comprehension intact.  Cranial Nerves:  II: PERRL. Visual fields full.  III, IV, VI: EOMI. Eyelids elevate symmetrically.  V: Sensation is intact to light touch and symmetrical to face.  VII: Face is symmetrical resting and smiling VIII: hearing intact to voice. IX, X: Palate elevates symmetrically. Phonation is normal.  ZO:[id number] shrug 5/5. XII: tongue is midline without fasciculations. Motor: Bilateral upper extremities with full strength trace left triceps weakness Bilateral lower extremity weakness 23/5 left worse than right.  Hip flexion weakness greater than extension.  Ankle dorsiflexors are weaker than plantarflexors.  He has good strength in hip adductor's and abductor's but knee extensors are weaker bilaterally Tone: is normal and bulk is normal Sensation-diminished sensation to sharp and dull sensation below the level of the nipple bilaterally, endorses normal sensation in the face, arms, and shoulders Coordination: FTN intact bilaterally, unable to perform HKS Gait- deferred   ASSESSMENT/PLAN  Acute Ischemic Infarct: Scattered punctate infarcts in the bilateral hemispheres and spinal cord infarct suspected thoracic level.  Etiology unclear from dissection or from endovascular repair Etiology: Postoperative aortic dissection repair Code Stroke CT head No acute abnormality.  CTA head & neck no acute intracranial process, no intracranial large vessel occlusion  or significant stenosis MRI  punctate acute infarcts in the R cerebellum, splenium of the corpus callosum on the L, R occipital lobe, R parietal lobe, and L lentiform nucleus  He did have an intraoperative TEE, however I am unable to see the report LDL 137 HgbA1c 5.7 VTE prophylaxis -subcu heparin No antithrombotic prior to admission, now on No antithrombotic will need to be discussed with vascular surgery and CCM prior to initiating antiplatelet or anticoagulant therapy Therapy recommendations: Pending  disposition: Pending  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 Spinal cord hyperintense lesion at C6 unlikely to explain his significant paraparesis.  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 not visualized on MRI due to metallic artifacts from endovascular aneurysm repair  lumbar drain placed 6/28 with orders to drain 10-30 cc/h  Type B aortic dissection 6/26- S/p 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 with coverage of the L subclavian artery and subsequent lasar fenestration at the L subclavian artery  Paroxysmal atrial fibrillation not on anticoagulation  Hypertension Home meds: Losartan, isosorbide bide mononitrate, spironolactone Stable Per vascular surgery-MAP greater than 90  Hyperlipidemia Home meds: Atorvastatin 80 mg, resumed in hospital LDL 137, goal < 70 Continue statin at discharge  Other Active Problems Hepatobiliary carcinoma with metastasis to the lung Hepatitis C -  treated Prostate cancer status post prostatectomy  Hospital day # 6  Patient presented with dissecting aortic aneurysm and had 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 with coverage of the L subclavian artery and subsequent laser fenestration at the L subclavian artery.  He developed bilateral leg weakness with MRI showing small bilateral embolic infarcts which do not explain the degree of leg weakness.  MRI of cervical spine shows spinal cord hyperintensities which could be  spinal cord infarct but cannot explain convincingly bilateral lower extremity weakness and sensory level on the trunk at the nipple level.Marland Kitchen  MRI scan of the thoracic spine unfortunately is not a good diagnostic quality due to aortic stent artifacts but the patient's clinical exam does suggest sensory loss from the chest down with asymmetric bilateral leg weakness which does suggest thoracic spinal cord infarct.  Patient has had a lumbar drain placed 3 days ago with plan for permissive hypertension to hopefully reduce spinal cord injury.  Plan is to discontinue lumbar drain today.  Mobilize out of bed.  Therapy consults.  Long discussion with patient and 2 lady family members at the bedside and answered questions.  Start anticoagulation when safe as per vascular surgery for stroke prevention.  Discussed with   Dr. Kathrine Cords augmentation of blood pressure with low-dose pressors and discontinue lumbar drain as per vascular surgery team.  Transfer to rehab when stable.  Stroke team will sign off.  Can call for questions.This patient is critically ill and at significant risk of neurological worsening, death and care requires constant monitoring of vital signs, hemodynamics,respiratory and cardiac monitoring, extensive review of multiple databases, frequent neurological assessment, discussion with family, other specialists and medical decision making of high complexity.I have made any additions or clarifications directly to the above note.This critical care time does not reflect procedure time, or teaching time or supervisory time of PA/NP/Med Resident etc but could involve care discussion time.  I spent 30 minutes of neurocritical care time  in the care of  this patient.               Delia Heady, MD Medical Director [facility] Pager: [phone] 11/05/2022 2:14 PM  To contact Stroke Continuity provider, please refer to [email or web address]. After hours, contact General Neurology

## 2022-11-05 NOTE — Plan of Care (Signed)
  Problem: Education: Goal: Knowledge of General Education information will improve Description: Including pain rating scale, medication(s)/side effects and non-pharmacologic comfort measures Outcome: Progressing   Problem: Health Behavior/Discharge Planning: Goal: Ability to manage health-related needs will improve Outcome: Progressing   Problem: Clinical Measurements: Goal: Ability to maintain clinical measurements within normal limits will improve Outcome: Progressing Goal: Will remain free from infection Outcome: Progressing Goal: Diagnostic test results will improve Outcome: Progressing Goal: Cardiovascular complication will be avoided Outcome: Progressing   Problem: Activity: Goal: Risk for activity intolerance will decrease Outcome: Progressing   Problem: Nutrition: Goal: Adequate nutrition will be maintained Outcome: Progressing   Problem: Coping: Goal: Level of anxiety will decrease Outcome: Progressing   Problem: Elimination: Goal: Will not experience complications related to urinary retention Outcome: Progressing   Problem: Pain Managment: Goal: General experience of comfort will improve Outcome: Progressing   Problem: Safety: Goal: Ability to remain free from injury will improve Outcome: Progressing   Problem: Skin Integrity: Goal: Risk for impaired skin integrity will decrease Outcome: Progressing   Problem: Education: Goal: Knowledge of discharge needs will improve Outcome: Progressing   Problem: Clinical Measurements: Goal: Postoperative complications will be avoided or minimized Outcome: Progressing   Problem: Respiratory: Goal: Ability to achieve and maintain a regular respiratory rate will improve Outcome: Progressing   Problem: Skin Integrity: Goal: Demonstration of wound healing without infection will improve Outcome: Progressing   Problem: Education: Goal: Knowledge of disease or condition will improve Outcome: Progressing Goal:  Knowledge of secondary prevention will improve (MUST DOCUMENT ALL) Outcome: Progressing Goal: Knowledge of patient specific risk factors will improve Loraine Leriche N/A or DELETE if not current risk factor) Outcome: Progressing   Problem: Ischemic Stroke/TIA Tissue Perfusion: Goal: Complications of ischemic stroke/TIA will be minimized Outcome: Progressing   Problem: Coping: Goal: Will verbalize positive feelings about self Outcome: Progressing Goal: Will identify appropriate support needs Outcome: Progressing   Problem: Health Behavior/Discharge Planning: Goal: Ability to manage health-related needs will improve Outcome: Progressing Goal: Goals will be collaboratively established with patient/family Outcome: Progressing   Problem: Self-Care: Goal: Ability to participate in self-care as condition permits will improve Outcome: Progressing Goal: Verbalization of feelings and concerns over difficulty with self-care will improve Outcome: Progressing Goal: Ability to communicate needs accurately will improve Outcome: Progressing

## 2022-11-05 NOTE — Evaluation (Signed)
Occupational Therapy Evaluation Patient Details Name: Mathew [name] MRN: [id number] DOB: 16-Jun-1950 Today's Date: 11/05/2022   History of Present Illness Pt is a 72 y/o M presenting to ED on 6/21 with chest pain and EKG with concern for ST elevation d/c'd 6/25, returned 6/26 and was urgently taken to OR. 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  Post op day 1 pt with L sided weakness, concern for spinal cord ischemia vs CVA. MRI brain with punctate infarcts in R cerebellum, R parietal lobe, R occipital lobe. 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   Clinical Impression   Pt ind at baseline with ADLs/functional mobility, lives with spouse. Pt currently needing set up - total A for ADLs, max  A+2 for bed mobiltiy and max A +2 for x3 STS attempts for pad change, laterally shifting toward HOB. Pt with active bleeding from lumbar drain site, RN present in room during session to address. Pt presenting with impairments listed below, will follow acutely. Patient will benefit from intensive inpatient follow up therapy, >3 hours/day to maximize safety/ind with ADLs/functional mobility.      Recommendations for follow up therapy are one component of a multi-disciplinary discharge planning process, led by the attending physician.  Recommendations may be updated based on patient status, additional functional criteria and insurance authorization.   Assistance Recommended at Discharge Frequent or constant Supervision/Assistance  Patient can return home with the following Two people to help with walking and/or transfers;A lot of help with bathing/dressing/bathroom;Assistance with cooking/housework;Direct supervision/assist for medications management;Direct supervision/assist for  financial management;Assist for transportation;Help with stairs or ramp for entrance    Functional Status Assessment  Patient has had a recent decline in their functional status and demonstrates the ability to make significant improvements in function in a reasonable and predictable amount of time.  Equipment Recommendations  Other (comment) (defer)    Recommendations for Other Services PT consult;Rehab consult     Precautions / Restrictions Precautions Precautions: Fall Precaution Comments: cortrak, O2 Restrictions Weight Bearing Restrictions: No      Mobility Bed Mobility Overal bed mobility: Needs Assistance Bed Mobility: Sidelying to Sit, Sit to Sidelying   Sidelying to sit: Max assist, +2 for physical assistance     Sit to sidelying: Max assist, +2 for physical assistance      Transfers Overall transfer level: Needs assistance Equipment used: 2 person hand held assist Transfers: Sit to/from Stand Sit to Stand: Max assist, +2 physical assistance           General transfer comment: STS x3 to clear hips and adjust pad, scooting toward HOB max A +2      Balance Overall balance assessment: Needs assistance Sitting-balance support: Feet supported Sitting balance-Leahy Scale: Fair Sitting balance - Comments: min A for sitting balance EOB   Standing balance support: During functional activity Standing balance-Leahy Scale: Poor Standing balance comment: reliant on external support                           ADL either performed or assessed with clinical judgement   ADL Overall ADL's : Needs assistance/impaired Eating/Feeding: Moderate assistance;Sitting;Bed level   Grooming: Set up;Sitting;Wash/dry face   Upper Body Bathing: Moderate assistance;Sitting;Bed level   Lower Body Bathing: Maximal assistance;Sitting/lateral leans;Bed level  Upper Body Dressing : Moderate assistance;Sitting;Bed level   Lower Body Dressing: Maximal  assistance;Sitting/lateral leans;Bed level   Toilet Transfer: Maximal assistance;+2 for physical assistance   Toileting- Clothing Manipulation and Hygiene: Total assistance       Functional mobility during ADLs: Maximal assistance;+2 for physical assistance       Vision Baseline Vision/History: 1 Wears glasses Additional Comments: able to read sign on wall and recognize family members in room, finger to nose test WFL bilaterally, tracks stimuli     Perception Perception Perception Tested?: No   Praxis Praxis Praxis tested?: Not tested    Pertinent Vitals/Pain Pain Assessment Pain Assessment: No/denies pain     Hand Dominance Right   Extremity/Trunk Assessment Upper Extremity Assessment Upper Extremity Assessment: Defer to OT evaluation   Lower Extremity Assessment Lower Extremity Assessment: LLE deficits/detail LLE Deficits / Details: grossly 1/5, reports normal sensation   Cervical / Trunk Assessment Cervical / Trunk Assessment: Kyphotic   Communication Communication Communication: No difficulties   Cognition Arousal/Alertness: Lethargic (more alert once seated EOB) Behavior During Therapy: Flat affect Overall Cognitive Status: Impaired/Different from baseline Area of Impairment: Memory, Orientation, Following commands                 Orientation Level: Situation     Following Commands: Follows one step commands with increased time       General Comments: oriented to self, place, time, able to recognize family members in room, follows commands with increased time     General Comments  VSS on 6LO2 via [place], pt with actively bleeding from lumbar drain site, RN came to room to address    Exercises     Shoulder Instructions      Home Living Family/patient expects to be discharged to:: Private residence Living Arrangements: Spouse/significant other Available Help at Discharge: Family;Available PRN/intermittently (can work toward 24/7 assist) Type  of Home: House Home Access: Ramped entrance;Stairs to enter [employer] of Steps: 2 Entrance Stairs-Rails: None Home Layout: Two level;Able to live on main level with bedroom/bathroom;Bed/bath upstairs (only half bath is downstairs) Alternate Level Stairs-Number of Steps: flight Alternate Level Stairs-Rails: Can reach both Bathroom Shower/Tub: Producer, television/film/video: Standard     Home Equipment: None          Prior Functioning/Environment Prior Level of Function : Independent/Modified Independent;Working/employed;Driving (drove truck)             Mobility Comments: no AD use ADLs Comments: ind        OT Problem List: Decreased strength;Decreased range of motion;Decreased activity tolerance;Impaired balance (sitting and/or standing);Decreased cognition      OT Treatment/Interventions: Self-care/ADL training;Therapeutic exercise;Energy conservation;DME and/or AE instruction;Therapeutic activities;Patient/family education;Balance training    OT Goals(Current goals can be found in the care plan section) Acute Rehab OT Goals Patient Stated Goal: none stated OT Goal Formulation: With patient Time For Goal Achievement: 11/19/22 Potential to Achieve Goals: Good  OT Frequency: Min 1X/week    Co-evaluation PT/OT/SLP Co-Evaluation/Treatment: Yes Reason for Co-Treatment: To address functional/ADL transfers;For patient/therapist safety;Complexity of the patient's impairments (multi-system involvement) PT goals addressed during session: Mobility/safety with mobility OT goals addressed during session: ADL's and self-care      AM-PAC OT "6 Clicks" Daily Activity     Outcome Measure Help from another person eating meals?: A Little Help from another person taking care of personal grooming?: A Lot Help from another person toileting, which includes using toliet, bedpan, or urinal?: A Lot Help from another person bathing (  including washing, rinsing, drying)?: A  Lot Help from another person to put on and taking off regular upper body clothing?: A Lot Help from another person to put on and taking off regular lower body clothing?: A Lot 6 Click Score: 13   End of Session Equipment Utilized During Treatment: Oxygen Nurse Communication: Mobility status;Other (comment) (bleeding noted from lumbar drain sit)  Activity Tolerance: Patient tolerated treatment well Patient left: in bed;with call bell/phone within reach;with bed alarm set;with nursing/sitter in room;with family/visitor present  OT Visit Diagnosis: Unsteadiness on feet (R26.81);Other abnormalities of gait and mobility (R26.89);Muscle weakness (generalized) (M62.81)                Time: 1335-1400 OT Time Calculation (min): 25 min Charges:  OT General Charges $OT Visit: 1 Visit OT Evaluation $OT Eval Moderate Complexity: 1 Mod  Jaiyanna Safran K, OTD, OTR/L SecureChat Preferred Acute Rehab [phone]   Carver Fila Koonce 11/05/2022, 4:31 PM

## 2022-11-05 NOTE — Evaluation (Signed)
Physical Therapy Evaluation Patient Details Name: Mathew [name] MRN: [id number] DOB: Oct 16, 1950 Today's Date: 11/05/2022  History of Present Illness  Pt is a 72 y/o M presenting to ED on 6/21 with chest pain and EKG with concern for ST elevation d/c'd 6/25, returned 6/26 and was urgently taken to OR. 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  Post op day 1 pt with L sided weakness, concern for spinal cord ischemia vs CVA. 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   Clinical Impression  Pt admitted with above. Pt presenting with L LE weakness near flaccidity, generalized deconditioning, over all weakness from hospital stay, impaired balance, and requires maxA for all transfers at this time. 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 Pt to strongly benefit from intense rehab program > 3 hours a day to achieve maximal functional recovery. Pt with great family support. Acute PT to cont to follow.  Pt with continued bleeding from lumbar drain site. RN notified and came to address it.       Assistance Recommended at Discharge Frequent or constant Supervision/Assistance  If plan is discharge home, recommend the following:  Can travel by private vehicle  A lot of help with walking and/or transfers;A lot of help with bathing/dressing/bathroom;Assist for transportation;Help with stairs or ramp for entrance        Equipment Recommendations  (TBD at next venue)  Recommendations for Other Services  Rehab consult    Functional Status Assessment Patient has had a recent decline in their functional status and demonstrates the ability to make significant improvements in function in a  reasonable and predictable amount of time.     Precautions / Restrictions Precautions Precautions: Fall Precaution Comments: cortrak, O2 Restrictions Weight Bearing Restrictions: No      Mobility  Bed Mobility Overal bed mobility: Needs Assistance Bed Mobility: Sidelying to Sit, Sit to Sidelying, Rolling Rolling: Mod assist Sidelying to sit: Max assist, +2 for physical assistance     Sit to sidelying: Max assist, +2 for physical assistance General bed mobility comments: HOB elevated for helicopter transfer to EOB, pt then returned to bed via laying down on R side and assist to roll over onto back    Transfers Overall transfer level: Needs assistance Equipment used: 2 person hand held assist Transfers: Sit to/from Stand Sit to Stand: Max assist, +2 physical assistance           General transfer comment: STS x3 to clear hips and adjust pad, scooting toward HOB max A +2, unable to achieve full upright standing, minimally cleared bottom    Ambulation/Gait               General Gait Details: unable this date  Stairs            Wheelchair Mobility     Tilt Bed    Modified Rankin (Stroke Patients Only)       Balance Overall balance assessment: Needs assistance Sitting-balance support: Feet supported Sitting balance-Leahy Scale: Fair Sitting balance - Comments: min A for sitting balance EOB initially, progressing to modA due to onset of fatigue and R lateral lean and inability to hold head up   Standing balance support: During functional activity Standing balance-Leahy  Scale: Poor Standing balance comment: reliant on external support, unable to achieve full stand                             Pertinent Vitals/Pain Pain Assessment Pain Assessment: No/denies pain    Home Living Family/patient expects to be discharged to:: Private residence Living Arrangements: Spouse/significant other Available Help at Discharge: Family;Available  PRN/intermittently (can work toward 24/7 assist) Type of Home: House Home Access: Ramped entrance;Stairs to enter Entrance Stairs-Rails: None Entrance Stairs-Number of Steps: 2 Alternate Level Stairs-Number of Steps: flight Home Layout: Two level;Able to live on main level with bedroom/bathroom;Bed/bath upstairs (only half bath is downstairs) Home Equipment: None      Prior Function Prior Level of Function : Independent/Modified Independent;Working/employed;Driving (drove truck)             Mobility Comments: no AD use ADLs Comments: ind     Hand Dominance   Dominant Hand: Right    Extremity/Trunk Assessment   Upper Extremity Assessment Upper Extremity Assessment: Defer to OT evaluation    Lower Extremity Assessment Lower Extremity Assessment: LLE deficits/detail LLE Deficits / Details: grossly 1/5, reports normal sensation    Cervical / Trunk Assessment Cervical / Trunk Assessment: Kyphotic  Communication   Communication: No difficulties  Cognition Arousal/Alertness: Lethargic (more alert once seated EOB) Behavior During Therapy: Flat affect Overall Cognitive Status: Impaired/Different from baseline Area of Impairment: Following commands                       Following Commands: Follows one step commands with increased time       General Comments: oriented to self, place, time, able to recognize family members in room, follows commands with increased time        General Comments General comments (skin integrity, edema, etc.): VSS on 6LO2 via [place], pt with actively bleeding from lumbar drain site, RN came to room to address    Exercises     Assessment/Plan    PT Assessment Patient needs continued PT services  PT Problem List Decreased strength;Decreased range of motion;Decreased activity tolerance;Decreased balance;Decreased knowledge of use of DME;Decreased safety awareness       PT Treatment Interventions DME instruction;Gait training;Stair  training;Functional mobility training;Therapeutic activities;Therapeutic exercise;Balance training;Neuromuscular re-education    PT Goals (Current goals can be found in the Care Plan section)  Acute Rehab PT Goals Patient Stated Goal: get better PT Goal Formulation: With patient/family Time For Goal Achievement: 11/19/22 Potential to Achieve Goals: Good    Frequency Min 1X/week     Co-evaluation PT/OT/SLP Co-Evaluation/Treatment: Yes Reason for Co-Treatment: To address functional/ADL transfers;For patient/therapist safety;Complexity of the patient's impairments (multi-system involvement) PT goals addressed during session: Mobility/safety with mobility OT goals addressed during session: ADL's and self-care       AM-PAC PT "6 Clicks" Mobility  Outcome Measure Help needed turning from your back to your side while in a flat bed without using bedrails?: A Lot Help needed moving from lying on your back to sitting on the side of a flat bed without using bedrails?: A Lot Help needed moving to and from a bed to a chair (including a wheelchair)?: Total Help needed standing up from a chair using your arms (e.g., wheelchair or bedside chair)?: Total Help needed to walk in hospital room?: Total Help needed climbing 3-5 steps with a railing? : Total 6 Click Score: 8    End of Session Equipment Utilized  During Treatment: Oxygen Activity Tolerance: Patient limited by fatigue (and active bleeding from lumbar drain site) Patient left: in bed;with call bell/phone within reach;with bed alarm set;with family/visitor present;with nursing/sitter in room Nurse Communication: Mobility status (came to address lumbar drain site due to excessive bleeding) PT Visit Diagnosis: Unsteadiness on feet (R26.81);Muscle weakness (generalized) (M62.81);Difficulty in walking, not elsewhere classified (R26.2)    Time: 1336-1401 PT Time Calculation (min) (ACUTE ONLY): 25 min   Charges:   PT Evaluation $PT Eval  Moderate Complexity: 1 Mod   PT General Charges $$ ACUTE PT VISIT: 1 Visit         Lewis Shock, PT, DPT Acute Rehabilitation Services Secure chat preferred Office #: [phone]   Iona Hansen 11/05/2022, 2:33 PM

## 2022-11-05 NOTE — Progress Notes (Signed)
NAME:  Mathew [name], MRN:  [id number], DOB:  1950-08-06, LOS: 6 ADMISSION DATE:  10/30/2022, CONSULTATION DATE:  10/30/2022 REFERRING MD: Myra Gianotti - VVS, CHIEF COMPLAINT:  Ruptured aortic aneurysm   History of Present Illness:  72 year old male with PMHx significant for HCC on chemotherapy, paroxysmal atrial fibrillation, hypertension, and hepatitis C status post treatment.  He initially presented to [facility] Emergency department on 6/21 with complaints of sudden onset chest pain and was diagnosed with thrombosed type B thoracic aortic dissection extending from the distal aortic arch to the level of the diaphragm.  He was admitted to the ICU for tight hemodynamic control and was ultimately discharged on 6/25 after having no expansion of the dissection.  He now presents again on 6/26 with the recurrence of chest pain.  CT angiogram in the emergency department demonstrated significant progression of the aortic dissection with focal area of active extravasation.  Vascular surgery was consulted and took the patient emergently to the operating room for treatment of aortic rupture with plans for endovascular repair.  PCCM was asked to admit to ICU postoperatively.  The L subclavian was covered with the stenting and surgery was able to provide outlet however states that the pressures in the L to R will still likely vary and the R extremity should be utilized for BP control.    PCCM asked to admit for medical management and BP control postoperatively.   Pertinent  Medical History   has a past medical history of Hepatitis C, Hypertension, Obstructive sleep apnea, Premature ventricular contraction, and Prostate cancer (HCC) (2006).  Significant Hospital Events: Including procedures, antibiotic start and stop dates in addition to other pertinent events   6/25 Discharged with Type B Ao dissection with intramural thrombosis  6/26 Returned for admittance  with ascending Ao rupture. Emergent OR for repair 6/27  Patient developed left-sided weakness, MRI brain noted for multiple acute infarcts with embolic pattern, MRI C-spine with cord ischemia on the right side.   6/28 Lumbar drain placed 6/30 Started on vasopressor support to maintain SBP greater than 140 7/1 Vascular continue plans to continue lumbar drain per vascular 7/2 Nausea with emesis, . Increased O2 requirement with faint bibasilar crackles. CXR + Lasix x 1. Lumbar drain removed.  Interim History / Subjective:  No significant events overnight LLE improving, no significant complaints, appetite is poor Nausea + vomiting x 1 with emesis after eating lunch, felt better after vomiting Increasing O2 needs, up to 6L [place] today, CVP 10 CXR ordered, will review and order Lasix x 1 if indicated Good UOP 2.5L/24H 7/1-7/2 Lumbar drain removed by anesthesia  Objective:  Blood pressure (!) 149/80, pulse 96, temperature 98.1 F (36.7 C), temperature source Axillary, resp. rate 20, height 6\' 3"  (1.905 m), weight 95.6 kg, SpO2 93 %.        Intake/Output Summary (Last 24 hours) at 11/05/2022 0803 Last data filed at 11/05/2022 0600 Gross per 24 hour  Intake 2341.28 ml  Output 1825 ml  Net 516.28 ml    Filed Weights   11/03/22 0500 11/04/22 0645 11/05/22 0500  Weight: 94 kg (P) 92.8 kg 95.6 kg   Physical Examination: General: Acutely ill-appearing older man in NAD. Soft-spoken, but awake/answering questions appropriately. HEENT: [place]/AT, anicteric sclera, PERRL, moist mucous membranes. Cortrak in place. Neuro: Awake, oriented x 4. Responds to verbal stimuli. Following commands consistently. Moves all 4 extremities spontaneously. Generalized weakness, but symmetrical in BUE, LLE slightly weaker than R. CV: RRR, no m/g/r. PULM: Breathing even  and minimally labored on 6L [place]. Lung fields diminished at bilateral bases with faint crackles, R > L. GI: Soft, nontender, nondistended. Hypoactive bowel sounds. Extremities: Trace bilateral  symmetric LE edema noted. Skin: Warm/dry, no rashes.  Resolved Hospital Problem List:     Assessment & Plan:  Ruptured type B aortic dissection s/p emergent TEVAR 6/26 > L subclavian was covered during surgery 6/28 CT angiogram showed stable repaired segment of aorta - VVS managing, appreciate assistance - Lumbar drain removed 7/2AM, monitoring closely for neurologic changes - Vasopressors to maintain SBP > 140  Acute bilateral multifocal embolic ischemic stroke MRI Brain showed bilateral multifocal embolic ischemic stroke involving right cerebellar splenium of the corpus callosum on the left, right occipital lobe, right parietal lobe, and left lentiform nucleus Cervical spinal cord edema, concern for infarct MRI C-spine showing spinal cord signal in the C6-C7, edema versus stroke. Ischemic stroke of brain does not explain his left leg weakness, this could be related to spinal cord infarction. Visual hallucinations- unclear if this is due to strokes vs. ICU delirium, none at present - Neuro/Stroke following, appreciate assistance - Lumbar drain removed by anesthesia 7/2 - Continue ASA/statin - Frequent neurochecks - Neuroprotective measures: HOB > 30 degrees, normoglycemia, normothermia, electrolytes WNL - Will eventually require MRI T-spine, ?repeat MRI Brain  Acute kidney injury likely ATN in the setting of hemodynamic insults, improved Hyponatremia NAG acidosis 2/2 renal failure - Trend BMP - Replete electrolytes as indicated - FWF per tube - Monitor I&Os - Avoid nephrotoxic agents as able - Ensure adequate renal perfusion  History of hypertension HLD - Hold home antihypertensives at present - Continue ASA/statin (as above)  ?COPD OSA - Continue supplemental O2 support - Wean O2 for sat 88-92% - CXR with atelectasis, ?mild edema; Lasix 40mg  x 1 given - Pulmonary hygiene  Nausea with vomiting - Antiemetics PRN - ADAT slowly  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 PTA on atezolizumab plus bevacizumab every 3 weeks. Will need outpatient follow-up with oncology. Atezolizumab has been associated with aortic dissection previously per FDA drug information sheet (combined in the 6% fatal adverse outcomes associated with the drug). Retrospective trials have also shown associations with dissections associated with bevacizumab, but the FDA has not included this in the drug information sheet to date.   Goals of care Patient has multiple comorbidities including metastatic cancer and new multiple infarcts in the setting of type B dissection, ongoing goals of care will be important.   - Await repeat MRIs prior to further GOC discussions - Consider PMT involvement  Best Practice: (right click and "Reselect all SmartList Selections" daily)   Diet/type: Regular consistency (see orders) > advance as tolerated DVT prophylaxis: prophylactic heparin  GI prophylaxis: N/A Lines: NA Foley:  N/A Code Status:  full code Last date of multidisciplinary goals of care discussion Patient and his family updated at bedside 7/2, decision was to continue full scope of care as of 7/1  Critical care time:   The patient is critically ill with multiple organ system failure and requires high complexity decision making for assessment and support, frequent evaluation and titration of therapies, advanced monitoring, review of radiographic studies and interpretation of complex data.   Critical Care Time devoted to patient care services, exclusive of separately billable procedures, described in this note is 36 minutes.  Tim Lair, PA-C [facility] 11/05/22 12:20 PM  Please see [email or web address] for pager details.  From 7A-7P if no response, please  call [phone] After hours, please call ELink [phone]

## 2022-11-05 NOTE — Anesthesia Post-op Follow-up Note (Signed)
  Anesthesia Pain Follow-up Note  Patient: Mathew [name]  Day #: 4  Date of Follow-up: 11/05/2022 Time: 9:14 AM  Last Vitals:  Vitals:   11/05/22 0645 11/05/22 0751  BP: (!) 149/80   Pulse:    Resp:    Temp:  36.6 C  SpO2:      Level of Consciousness: alert  Pain: none   Side Effects:None  Catheter Site Exam:clean, dry     Plan: Catheter removed/tip intact at surgeon's request  Khyree Carillo DANIEL

## 2022-11-05 NOTE — Plan of Care (Signed)
  Problem: Education: Goal: Knowledge of secondary prevention will improve (MUST DOCUMENT ALL) Outcome: Progressing Goal: Knowledge of patient specific risk factors will improve Loraine Leriche N/A or DELETE if not current risk factor) Outcome: Progressing   Problem: Ischemic Stroke/TIA Tissue Perfusion: Goal: Complications of ischemic stroke/TIA will be minimized Outcome: Progressing   Problem: Coping: Goal: Will identify appropriate support needs Outcome: Progressing   Problem: Self-Care: Goal: Ability to participate in self-care as condition permits will improve Outcome: Progressing Goal: Ability to communicate needs accurately will improve Outcome: Progressing   Problem: Education: Goal: Knowledge of secondary prevention will improve (MUST DOCUMENT ALL) 11/05/2022 0428 by Jocelyn Lamer, RN Outcome: Progressing 11/05/2022 0427 by Jocelyn Lamer, RN Outcome: Progressing Goal: Knowledge of patient specific risk factors will improve Loraine Leriche N/A or DELETE if not current risk factor) 11/05/2022 0428 by Jocelyn Lamer, RN Outcome: Progressing 11/05/2022 0427 by Jocelyn Lamer, RN Outcome: Progressing   Problem: Ischemic Stroke/TIA Tissue Perfusion: Goal: Complications of ischemic stroke/TIA will be minimized 11/05/2022 0428 by Jocelyn Lamer, RN Outcome: Progressing 11/05/2022 0427 by Jocelyn Lamer, RN Outcome: Progressing   Problem: Coping: Goal: Will identify appropriate support needs 11/05/2022 0428 by Jocelyn Lamer, RN Outcome: Progressing 11/05/2022 0427 by Jocelyn Lamer, RN Outcome: Progressing   Problem: Self-Care: Goal: Ability to participate in self-care as condition permits will improve 11/05/2022 0428 by Jocelyn Lamer, RN Outcome: Progressing 11/05/2022 0427 by Jocelyn Lamer, RN Outcome: Progressing Goal: Ability to communicate needs accurately will improve 11/05/2022 0428 by Jocelyn Lamer, RN Outcome: Progressing 11/05/2022 0427 by Jocelyn Lamer, RN Outcome: Progressing   Problem: Nutrition: Goal: Dietary intake will improve 11/05/2022 0428 by Jocelyn Lamer, RN Outcome: Not Progressing Note: Pt PO intake still remains poor. Pt wishing to remove cortrak d/t discomfort. Cortrak assessed and patient educated on nutrition goals for removing. 11/05/2022 0427 by Jocelyn Lamer, RN Outcome: Not Progressing

## 2022-11-06 ENCOUNTER — Other Ambulatory Visit: Payer: Self-pay

## 2022-11-06 ENCOUNTER — Telehealth: Payer: Self-pay

## 2022-11-06 DIAGNOSIS — Z720 Tobacco use: Secondary | ICD-10-CM | POA: Diagnosis not present

## 2022-11-06 DIAGNOSIS — I6310 Cerebral infarction due to embolism of unspecified precerebral artery: Secondary | ICD-10-CM | POA: Diagnosis not present

## 2022-11-06 DIAGNOSIS — C220 Liver cell carcinoma: Secondary | ICD-10-CM

## 2022-11-06 DIAGNOSIS — G9511 Acute infarction of spinal cord (embolic) (nonembolic): Secondary | ICD-10-CM

## 2022-11-06 DIAGNOSIS — I71019 Dissection of thoracic aorta, unspecified: Secondary | ICD-10-CM | POA: Diagnosis not present

## 2022-11-06 LAB — GLUCOSE, CAPILLARY
Glucose-Capillary: 119 mg/dL — ABNORMAL HIGH (ref 70–99)
Glucose-Capillary: 135 mg/dL — ABNORMAL HIGH (ref 70–99)
Glucose-Capillary: 78 mg/dL (ref 70–99)
Glucose-Capillary: 81 mg/dL (ref 70–99)
Glucose-Capillary: 85 mg/dL (ref 70–99)
Glucose-Capillary: 86 mg/dL (ref 70–99)
Glucose-Capillary: 90 mg/dL (ref 70–99)

## 2022-11-06 LAB — CBC
HCT: 39.7 % (ref 39.0–52.0)
Hemoglobin: 13.1 g/dL (ref 13.0–17.0)
MCH: 26 pg (ref 26.0–34.0)
MCHC: 33 g/dL (ref 30.0–36.0)
MCV: 78.8 fL — ABNORMAL LOW (ref 80.0–100.0)
Platelets: 309 10*3/uL (ref 150–400)
RBC: 5.04 MIL/uL (ref 4.22–5.81)
RDW: 18.4 % — ABNORMAL HIGH (ref 11.5–15.5)
WBC: 14.7 10*3/uL — ABNORMAL HIGH (ref 4.0–10.5)
nRBC: 0.4 % — ABNORMAL HIGH (ref 0.0–0.2)

## 2022-11-06 LAB — BASIC METABOLIC PANEL
Anion gap: 8 (ref 5–15)
BUN: 33 mg/dL — ABNORMAL HIGH (ref 8–23)
CO2: 24 mmol/L (ref 22–32)
Calcium: 8.4 mg/dL — ABNORMAL LOW (ref 8.9–10.3)
Chloride: 100 mmol/L (ref 98–111)
Creatinine, Ser: 1.14 mg/dL (ref 0.61–1.24)
GFR, Estimated: >60 mL/min (ref >60)
Glucose, Bld: 89 mg/dL (ref 70–99)
Potassium: 4.3 mmol/L (ref 3.5–5.1)
Sodium: 132 mmol/L — ABNORMAL LOW (ref 135–145)

## 2022-11-06 LAB — PHOSPHORUS: Phosphorus: 3.5 mg/dL (ref 2.5–4.6)

## 2022-11-06 LAB — MAGNESIUM: Magnesium: 2.5 mg/dL — ABNORMAL HIGH (ref 1.7–2.4)

## 2022-11-06 MED ORDER — HEPARIN SODIUM (PORCINE) 5000 UNIT/ML IJ SOLN
5000.0000 [IU] | Freq: Three times a day (TID) | INTRAMUSCULAR | Status: DC
  Administered 2022-11-06 – 2022-11-19 (×38): 5000 [IU] via SUBCUTANEOUS
  Filled 2022-11-06 (×38): qty 1

## 2022-11-06 MED ORDER — BISACODYL 10 MG RE SUPP
10.0000 mg | Freq: Once | RECTAL | Status: AC
  Administered 2022-11-06: 10 mg via RECTAL
  Filled 2022-11-06: qty 1

## 2022-11-06 MED ORDER — ASPIRIN 81 MG PO CHEW
81.0000 mg | CHEWABLE_TABLET | Freq: Every day | ORAL | Status: DC
  Administered 2022-11-07 – 2022-11-19 (×13): 81 mg via ORAL
  Filled 2022-11-06 (×13): qty 1

## 2022-11-06 NOTE — Progress Notes (Signed)
NAME:  Geramy Fickle, MRN:  [id number], DOB:  09-13-1950, LOS: 7 ADMISSION DATE:  10/30/2022, CONSULTATION DATE:  10/30/2022 REFERRING MD: Myra Gianotti - VVS, CHIEF COMPLAINT:  Ruptured aortic aneurysm   History of Present Illness:  72 year old male with PMHx significant for HCC on chemotherapy, paroxysmal atrial fibrillation, hypertension, and hepatitis C status post treatment.  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  He was admitted to the ICU for tight hemodynamic control and was ultimately discharged on 6/25 after having no expansion of the dissection.  He now presents again on 6/26 with the recurrence of chest pain.  CT angiogram in the emergency department demonstrated significant progression of the aortic dissection with focal area of active extravasation.  Vascular surgery was consulted and took the patient emergently to the operating room for treatment of aortic rupture with plans for endovascular repair.  PCCM was asked to admit to ICU postoperatively.  The L subclavian was covered with the stenting and surgery was able to provide outlet however states that the pressures in the L to R will still likely vary and the R extremity should be utilized for BP control.    PCCM asked to admit for medical management and BP control postoperatively.   Pertinent  Medical History   has a past medical history of Hepatitis C, Hypertension, Obstructive sleep apnea, Premature ventricular contraction, and Prostate cancer (HCC) (2006).  Significant Hospital Events: Including procedures, antibiotic start and stop dates in addition to other pertinent events   6/25 Discharged with Type B Ao dissection with intramural thrombosis  6/26 Returned for admittance  with ascending Ao rupture. Emergent OR for repair 6/27  Patient developed left-sided weakness, MRI brain noted for multiple acute infarcts with embolic pattern, MRI C-spine with cord ischemia on the right side.   6/28 Lumbar drain placed 6/30 Started on vasopressor support to maintain SBP greater than 140 7/1 Vascular continue plans to continue lumbar drain per vascular 7/2 Nausea with emesis, . Increased O2 requirement with faint bibasilar crackles. CXR + Lasix x 1. Lumbar drain removed.  Interim History / Subjective:  No BM today. No vomiting overnight. Hungry and wants to eat. Per family, he has been sleeping a lot during the day, but he says that is due to boredom.   Objective:  Blood pressure (!) 146/89, pulse 97, temperature 97.6 F (36.4 C), temperature source Oral, resp. rate 15, height 6\' 3"  (1.905 m), weight 92.6 kg, SpO2 91 %. CVP:  [1 mmHg-10 mmHg] 5 mmHg      Intake/Output Summary (Last 24 hours) at 11/06/2022 0927 Last data filed at 11/06/2022 0700 Gross per 24 hour  Intake 573.27 ml  Output 2475 ml  Net -1901.73 ml    Filed Weights   11/04/22 0645 11/05/22 0500 11/06/22 0329  Weight: (P) 92.8 kg 95.6 kg 92.6 kg   Physical Examination: General: chronically ill appearing man lying in bed in NAD HEENT: [place]/AT, eyes anicteric, cortrak & NGT. Neuro:awake, alert, answering questions, telling jokes. Able to lift ankle about 1 inch off bed, has firing in his quad- 3/5, able to lift R leg off bed.  CV: S1S2, RRR PULM: breathing comfortably on [place] GI: mildly distended but soft, hypoactive bowel sounds. Nontender.  Extremities: no significant LE edema Skin: warm, dry, no rashes  Na+  132 BUN  33 Cr 1.14 WBC 14.7 H/H 13.1/39.7 Platelets 309   Resolved Hospital Problem List:     Assessment & Plan:  Ruptured type B aortic dissection s/p emergent TEVAR, complicated by spinal cord ischemia and embolic strokes, but he is showing signs of recovery of motor function suggesting against complete spinal infarction.  6/26 > L  subclavian was covered during surgery 6/28 CT angiogram showed stable repaired segment of aorta - appreciate vascular surgery's management -maintain SBP 140-160, has not needed pressors in 2 days -spinal drain remove 7/2  Acute bilateral multifocal embolic ischemic stroke MRI Brain showed bilateral multifocal embolic ischemic stroke involving right cerebellar splenium of the corpus callosum on the left, right occipital lobe, right parietal lobe, and left lentiform nucleus Cervical spinal cord edema, concern for infarct MRI C-spine showing spinal cord signal in the C6-C7, edema versus stroke. Ischemic stroke of brain does not explain his left leg weakness, this could be related to spinal cord infarction. Visual hallucinations- unclear if this is due to strokes vs. ICU delirium, none at present - Appreciate stroke team's management. Secondary stroke prevention, focus on rehab. -aspirin, statin -neuro checks -would wait 1 more day before starting AC due to bleeding from having spinal drain removed yesterday -not sure that additional mri is needed as it will not change his therapy at this point  Acute kidney injury likely ATN in the setting of hemodynamic insults, resolved Hyponatremia NAG acidosis 2/2 renal failure -monitor -strict I/O -renally dose meds, avoid nephrotoxins -ensure adequate perfusion  History of hypertension HLD - holding PTA antihypertensives - aspirin, statin  ?COPD; history of tobacco abuse OSA - recommended smoking cessation -pulmonary hygiene -wean O2 off -OOB mobility as able now that spinal drain is out  Constipation Nausea with vomiting -dulcolax; soap suds enema if not resolved -can trial clear liquids today and advance as tolerated  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 PTA on atezolizumab plus bevacizumab every 3 weeks. Will need outpatient follow-up with oncology. Atezolizumab has been associated with aortic dissection  previously per FDA drug information sheet (combined in the 6% fatal adverse outcomes associated with the drug). Retrospective trials have also shown associations with dissections associated with bevacizumab, but the FDA has not included this in the drug information sheet to date.  -discussed with his wife today that his oncologist needs to know about his dissection and make a determination of therapy change is needed  Goals of care Patient has multiple comorbidities including metastatic cancer and new multiple infarcts in the setting of type B dissection, ongoing goals of care will be important.   -ongoing goals of are discussions if he fails to make improvements; family understands that his cancer will progress while he is unable to get treatment for it while he is recovery.   Stable to transfer to the PCU today-- d/w VVS. TRH to assume care tomorrow.   Best Practice: (right click and "Reselect all SmartList Selections" daily)   Diet/type: Regular consistency (see orders) > advance as tolerated DVT prophylaxis: prophylactic heparin  GI prophylaxis: N/A Lines: NA Foley:  N/A Code Status:  full code Last date of multidisciplinary goals of care discussion Patient and his family updated at bedside 7/2, decision was to continue full scope of care as of 7/1  Critical care time:    Steffanie Dunn, DO 11/06/22 11:34 AM [facility]  For contact information, see Amion. If no response to pager, please call PCCM consult pager. After hours, 7PM-  7AM, please call Elink.

## 2022-11-06 NOTE — Progress Notes (Signed)
Enema held per Dr Chestine Spore, patient had BM at 1700.

## 2022-11-06 NOTE — Progress Notes (Signed)
Brief Nutrition Follow-up:  Discussed nutrition poc with Dr. Chestine Spore this AM. TF held yesterday due to concern for possible ileus, episode of vomiting with significant nausea.   Plan to keep Cortrak in place and hold TF again today as pt continues without BM. Pt indicating he is hungry and wants to eat so diet advanced per Dr. Chestine Spore.   If no further N/V, recommend resuming TF tomorrow. Cortrak placed initially due to patient's poor oral intake with presence of malnutrition   Romelle Starcher MS, RDN, LDN, CNSC Registered Dietitian 3 Clinical Nutrition RD Pager and On-Call Pager Number Located in [place]

## 2022-11-06 NOTE — Progress Notes (Signed)
Physical Therapy Treatment Patient Details Name: Mathew [name] MRN: [id number] DOB: 1950-07-26 Today's Date: 11/06/2022   History of Present Illness Mathew [name] is a 72 y/o M presenting to ED on 6/21 with chest pain and EKG with concern for ST elevation d/c'd 6/25, returned 6/26 and was urgently taken to OR. 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  Post op day 1 Mathew [name] with L sided weakness, concern for spinal cord ischemia vs CVA. 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    Mathew [name] Comments  Mathew [name] with increased active movement of L LE this date compared to yesterday however overall very deconditioned and weak. Upon sitting EOB Mathew [name] with report of feeling like he needed to have a BM. Mathew [name] was used to transfer Mathew [name] over to Mathew [name]. Mathew [name] requiring maxAx2 to stand up into stedy with additional 3rd person posteriorly to assist with power up. Mathew [name] unable to achieve full upright standing and maintained trunk flexed even when on BSC. Acute Mathew [name] to cont to follow.     Assistance Recommended at Discharge Frequent or constant Supervision/Assistance  If plan is discharge home, recommend the following:  Can travel by private vehicle    A lot of help with walking and/or transfers;A lot of help with bathing/dressing/bathroom;Assist for transportation;Help with stairs or ramp for entrance      Equipment Recommendations   (TBD at next venue)    Recommendations for Other Services Rehab consult     Precautions / Restrictions Precautions Precautions: Fall Precaution Comments: cortrak, O2, NG tube Restrictions Weight Bearing Restrictions: No     Mobility  Bed Mobility Overal bed mobility: Needs Assistance Bed  Mobility: Supine to Sit     Supine to sit: Max assist, +2 for physical assistance, HOB elevated     General bed mobility comments: Mathew [name] able to assist with L LE movement this date ot EOB, maxA for trunk elevation and to scoot to EOB with feet on the floor    Transfers Overall transfer level: Needs assistance Equipment used:  (stedy) Transfers: Sit to/from Stand Sit to Stand: Max assist, +2 physical assistance, +2 safety/equipment           General transfer comment: Mathew [name] required maxAX2, plus a 3rd person posteriorly to aide in power up to stand in stedy this date. Mathew [name] maxAx2 to stand up from stedy seat, completed 4 total stands. Mathew [name] transferred to [facility] using stedy and then to recliner    Ambulation/Gait               General Gait Details: unable this date   Stairs             Wheelchair Mobility     Tilt Bed    Modified Rankin (Stroke Patients Only)       Balance Overall balance assessment: Needs assistance Sitting-balance support: Feet supported Sitting balance-Leahy Scale: Fair Sitting balance - Comments: min A for sitting balance EOB initially, progressing to modA due to onset of fatigue and R lateral lean and inability to hold head up   Standing balance support: During functional activity Standing balance-Leahy Scale: Poor Standing balance comment: reliant on external support, unable to achieve full stand  Cognition Arousal/Alertness: Awake/alert Behavior During Therapy: Flat affect Overall Cognitive Status: Impaired/Different from baseline Area of Impairment: Following commands                 Orientation Level: Situation     Following Commands: Follows one step commands with increased time       General Comments: Mathew [name] very fatigued, talks softly, requires constant verbal cues to complete task        Exercises      General Comments General comments (skin integrity, edema, etc.): VSS on 4LO2 via  [place]. Mathew [name] assisted to [facility] to attempt to have BM, however unsuccesful      Pertinent Vitals/Pain Pain Assessment Pain Assessment: No/denies pain    Home Living                          Prior Function            Mathew [name] Goals (current goals can now be found in the care plan section) Acute Rehab Mathew [name] Goals Patient Stated Goal: get better Mathew [name] Goal Formulation: With patient/family Time For Goal Achievement: 11/19/22 Potential to Achieve Goals: Good Progress towards Mathew [name] goals: Progressing toward goals    Frequency    Min 1X/week      Mathew [name] Plan Current plan remains appropriate    Co-evaluation              AM-PAC Mathew [name] "6 Clicks" Mobility   Outcome Measure  Help needed turning from your back to your side while in a flat bed without using bedrails?: A Lot Help needed moving from lying on your back to sitting on the side of a flat bed without using bedrails?: A Lot Help needed moving to and from a bed to a chair (including a wheelchair)?: Total Help needed standing up from a chair using your arms (e.g., wheelchair or bedside chair)?: Total Help needed to walk in hospital room?: Total Help needed climbing 3-5 steps with a railing? : Total 6 Click Score: 8    End of Session Equipment Utilized During Treatment: Oxygen Activity Tolerance: Patient limited by fatigue Patient left: with call bell/phone within reach;with nursing/sitter in room;in chair;with chair alarm set Nurse Communication: Mobility status;Need for lift equipment (maxisky lift pad placed under patient for transfer back to bed) Mathew [name] Visit Diagnosis: Unsteadiness on feet (R26.81);Muscle weakness (generalized) (M62.81);Difficulty in walking, not elsewhere classified (R26.2)     Time: 1610-9604 Mathew [name] Time Calculation (min) (ACUTE ONLY): 18 min  Charges:    $Therapeutic Activity: 8-22 mins Mathew [name] General Charges $$ ACUTE Mathew [name] VISIT: 1 Visit                     Mathew [name], Mathew [name], Mathew [name] Acute Rehabilitation  Services Secure chat preferred Office #: [phone]    Iona Hansen 11/06/2022, 1:20 PM

## 2022-11-06 NOTE — Progress Notes (Signed)
Speech Language Pathology Treatment: Cognitive-Linquistic  Patient Details Name: Mathew [name] MRN: [id number] DOB: 1950/10/21 Today's Date: 11/06/2022 Time: 4098-1191 SLP Time Calculation (min) (ACUTE ONLY): 15 min  Assessment / Plan / Recommendation Clinical Impression  Patient seen by SLP for skilled treatment session focused on cognitive function goals. RN reported that patient was transferring to a unit upstairs today. She also reported patient has not gotten and Haldol today. When SLP entered room, patient awake and alert. He appears more lucid as compared to when SLP saw him on 11/02/22. He was able to accurately describe the visual hallucinations much more coherently, telling SLP he was aware that it was not real. He was able to recall therapeutic and nursing interventions, is aware of transfer to unit upstairs. Responses were more prompt and he was able to elaborate and give more detail when requested. He did become a little fixated on telling SLP how a scab on his right arm came to be (from old IV). He was not able to recall what MD talked about it or information about his medical POC. Overall, patient appears with some improvement in his cognitive function. SLP will continue to follow.   HPI HPI: Patient is a 72 y.o. male with PMH: prostate cancer, hepatitis C, HTN, OSA (unable to tolerate CPAP), h/o heptocellular cancer(getting chemotherapy). He presented to the hospital on 10/25/22 with chest and abdominal pain with workup revealing type B aortic dissection and was treated with blood pressure medication and pain management. Follow up CT did not show significant change. He discharged home 6/25 but in AM 6/26 his chest pain returned so he came back to the hospital.  MRI brain showed punctate acute infarcts in the right cerebellum, splenium of the  corpus callosum on the left, right occipital lobe, right parietal lobe, and left lentiform nucleus.      SLP Plan  Continue with current plan of  care      Recommendations for follow up therapy are one component of a multi-disciplinary discharge planning process, led by the attending physician.  Recommendations may be updated based on patient status, additional functional criteria and insurance authorization.    Recommendations   SLP at next venue of care                      Intermittent Supervision/Assistance Cognitive communication deficit (Y78.295)     Continue with current plan of care    Angela Nevin, MA, CCC-SLP Speech Therapy

## 2022-11-06 NOTE — Telephone Encounter (Signed)
Pt's wife "Melba" called stating that the pt is in the hospital in ICU.  Melba stated the pt was admitted sometime last week and has now had a stroke w/lft side residual.  Melba stated PT has been coming in to working with pt.Judieth Keens also stated that they are unsure as to when the pt will be d/c d/t pt's recent stroke but will be d/c to a rehabilitation center.  Melba stated the pt is currently scheduled for chemo on 11/13/2022.  Informed Melba that the pt's appts will most likely be rescheduled to a later date but this nurse will make Dr. Mosetta Putt aware of the pt's hospitalization and recent changes in care.  Melba verbalized understanding and had no further questions or concerns.

## 2022-11-06 NOTE — Progress Notes (Signed)
Physical Therapy Treatment Patient Details Name: Mathew [name] MRN: [id number] DOB: 1951-04-22 Today's Date: 11/06/2022   History of Present Illness Pt is a 72 y/o M presenting to ED on 6/21 with chest pain and EKG with concern for ST elevation d/c'd 6/25, returned 6/26 and was urgently taken to OR. 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  Post op day 1 pt with L sided weakness, concern for spinal cord ischemia vs CVA. 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    PT Comments  PT was asked to return to assist pt off of BSC using the stedy. Pt required maxAx2 plus a 3rd person to maintain L LE in optimal position to stand in stedy. Pt unable to achieve trunk extension. However did work on pushing himself up to upright position when sitting on the seat of the stedy. Pt only able to maintain upright position with outstretched UEs for 10 sec. Acute PT to cont to follow. Maxisky lift pad placed under patient in recliner for safe transfer back to bed as able.     Assistance Recommended at Discharge Frequent or constant Supervision/Assistance  If plan is discharge home, recommend the following:  Can travel by private vehicle    A lot of help with walking and/or transfers;A lot of help with bathing/dressing/bathroom;Assist for transportation;Help with stairs or ramp for entrance      Equipment Recommendations   (TBD at next venue)    Recommendations for Other Services Rehab consult     Precautions / Restrictions Precautions Precautions: Fall Precaution Comments: cortrak, O2, NG tube Restrictions Weight Bearing Restrictions: No     Mobility  Bed Mobility Overal bed  mobility: Needs Assistance Bed Mobility: Supine to Sit     Supine to sit: Max assist, +2 for physical assistance, HOB elevated     General bed mobility comments: pt able to assist with L LE movement this date ot EOB, maxA for trunk elevation and to scoot to EOB with feet on the floor    Transfers Overall transfer level: Needs assistance Equipment used:  (stedy) Transfers: Sit to/from Stand Sit to Stand: Max assist, +2 physical assistance, +2 safety/equipment           General transfer comment: pt required maxAX2, plus a 3rd person posteriorly to aide in power up to stand in stedy this date. Pt maxAx2 to stand up from stedy seat, completed 4 total stands. pt transferred to [facility] using stedy and then to recliner    Ambulation/Gait               General Gait Details: unable this date   Stairs             Wheelchair Mobility     Tilt Bed    Modified Rankin (Stroke Patients Only)       Balance Overall balance assessment: Needs assistance Sitting-balance support: Feet supported Sitting balance-Leahy Scale: Fair Sitting balance - Comments: min A for sitting balance EOB initially, progressing to modA due to onset of fatigue and R lateral lean and inability to hold head up   Standing balance support: During functional activity Standing balance-Leahy Scale: Poor Standing balance comment: reliant on external support, unable to achieve full stand  Cognition Arousal/Alertness: Awake/alert Behavior During Therapy: Flat affect Overall Cognitive Status: Impaired/Different from baseline Area of Impairment: Following commands                 Orientation Level: Situation     Following Commands: Follows one step commands with increased time       General Comments: pt very fatigued, talks softly, requires constant verbal cues to complete task        Exercises      General Comments General comments (skin  integrity, edema, etc.): VSS, pt with minimal stool,      Pertinent Vitals/Pain Pain Assessment Pain Assessment: No/denies pain    Home Living                          Prior Function            PT Goals (current goals can now be found in the care plan section) Acute Rehab PT Goals Patient Stated Goal: get better PT Goal Formulation: With patient/family Time For Goal Achievement: 11/19/22 Potential to Achieve Goals: Good Progress towards PT goals: Progressing toward goals    Frequency    Min 1X/week      PT Plan Current plan remains appropriate    Co-evaluation              AM-PAC PT "6 Clicks" Mobility   Outcome Measure  Help needed turning from your back to your side while in a flat bed without using bedrails?: A Lot Help needed moving from lying on your back to sitting on the side of a flat bed without using bedrails?: A Lot Help needed moving to and from a bed to a chair (including a wheelchair)?: Total Help needed standing up from a chair using your arms (e.g., wheelchair or bedside chair)?: Total Help needed to walk in hospital room?: Total Help needed climbing 3-5 steps with a railing? : Total 6 Click Score: 8    End of Session Equipment Utilized During Treatment: Oxygen Activity Tolerance: Patient limited by fatigue Patient left: with call bell/phone within reach;with nursing/sitter in room;in chair;with chair alarm set Nurse Communication: Mobility status;Need for lift equipment (maxisky lift pad placed under patient for transfer back to bed) PT Visit Diagnosis: Unsteadiness on feet (R26.81);Muscle weakness (generalized) (M62.81);Difficulty in walking, not elsewhere classified (R26.2)     Time: 4098-1191 PT Time Calculation (min) (ACUTE ONLY): 15 min  Charges:    $Therapeutic Activity: 8-22 mins PT General Charges $$ ACUTE PT VISIT: 1 Visit                     Lewis Shock, PT, DPT Acute Rehabilitation Services Secure chat  preferred Office #: [phone]    Iona Hansen 11/06/2022, 1:24 PM

## 2022-11-06 NOTE — Progress Notes (Signed)
  Progress Note    11/06/2022 6:55 AM 7 Days Post-Op  Subjective:  says he feels his legs are better.  He is hungry this morning.  Wants some water  Afebrile HR 90's-100's NSR 120's-150's systolic 95%HFNC  Vitals:   11/06/22 0500 11/06/22 0600  BP: (!) 150/108 139/86  Pulse: (!) 105 92  Resp: 18 19  Temp:    SpO2: (!) 88% 96%    Physical Exam: General:  resting; wakes easily Cardiac:  regular Lungs:  non labored Extremities:  palpable DP and radial pulses bilaterally Abdomen:  soft  CBC    Component Value Date/Time   WBC 14.7 (H) 11/06/2022 0207   RBC 5.04 11/06/2022 0207   HGB 13.1 11/06/2022 0207   HGB 18.4 (H) 10/23/2022 1417   HCT 39.7 11/06/2022 0207   PLT 309 11/06/2022 0207   PLT 238 10/23/2022 1417   MCV 78.8 (L) 11/06/2022 0207   MCH 26.0 11/06/2022 0207   MCHC 33.0 11/06/2022 0207   RDW 18.4 (H) 11/06/2022 0207   LYMPHSABS 3.0 10/23/2022 1417   MONOABS 0.7 10/23/2022 1417   EOSABS 0.1 10/23/2022 1417   BASOSABS 0.2 (H) 10/23/2022 1417    BMET    Component Value Date/Time   NA 132 (L) 11/06/2022 0207   K 4.3 11/06/2022 0207   CL 100 11/06/2022 0207   CO2 24 11/06/2022 0207   GLUCOSE 89 11/06/2022 0207   BUN 33 (H) 11/06/2022 0207   CREATININE 1.14 11/06/2022 0207   CREATININE 1.25 (H) 10/23/2022 1417   CALCIUM 8.4 (L) 11/06/2022 0207   GFRNONAA >60 11/06/2022 0207   GFRNONAA >60 10/23/2022 1417   GFRAA >60 06/12/2018 0815    INR    Component Value Date/Time   INR 1.3 (H) 11/01/2022 1551     Intake/Output Summary (Last 24 hours) at 11/06/2022 0655 Last data filed at 11/06/2022 1610 Gross per 24 hour  Intake 783.28 ml  Output 2475 ml  Net -1691.72 ml      Assessment/Plan:  72 y.o. male is s/p:  emergent repair of ruptured left thoracic aortic aneurysm with endovascular stent graft and laser fenestration and subsequent stenting to the left subclavian artery    7 Days Post-Op   -pt with palpable DP and radial pulses  bilaterally -worked with PT/OT yesterday and they are recommending CIR, however, currently, they do not feel he is at a level to tolerate the intensity of CIR.  Hopefully over the next few days, he will be.  -pt hungry this am-he is npo-will defer to primary team for diet.  -DVT prophylaxis:  will restart his sq heparin.  He did have some oozing from the spinal drain site.  RN reports this is resolved.    Doreatha Massed, PA-C [facility] [phone] 11/06/2022 6:55 AM

## 2022-11-06 NOTE — Progress Notes (Signed)
NG tube removed per MD order without difficulty.

## 2022-11-07 DIAGNOSIS — I71019 Dissection of thoracic aorta, unspecified: Secondary | ICD-10-CM | POA: Diagnosis not present

## 2022-11-07 LAB — BASIC METABOLIC PANEL
Anion gap: 11 (ref 5–15)
BUN: 41 mg/dL — ABNORMAL HIGH (ref 8–23)
CO2: 20 mmol/L — ABNORMAL LOW (ref 22–32)
Calcium: 8.4 mg/dL — ABNORMAL LOW (ref 8.9–10.3)
Chloride: 100 mmol/L (ref 98–111)
Creatinine, Ser: 1.24 mg/dL (ref 0.61–1.24)
GFR, Estimated: >60 mL/min (ref >60)
Glucose, Bld: 107 mg/dL — ABNORMAL HIGH (ref 70–99)
Potassium: 4.6 mmol/L (ref 3.5–5.1)
Sodium: 131 mmol/L — ABNORMAL LOW (ref 135–145)

## 2022-11-07 LAB — GLUCOSE, CAPILLARY
Glucose-Capillary: 88 mg/dL (ref 70–99)
Glucose-Capillary: 94 mg/dL (ref 70–99)
Glucose-Capillary: 123 mg/dL — ABNORMAL HIGH (ref 70–99)
Glucose-Capillary: 98 mg/dL (ref 70–99)
Glucose-Capillary: 113 mg/dL — ABNORMAL HIGH (ref 70–99)

## 2022-11-07 LAB — PHOSPHORUS: Phosphorus: 3.1 mg/dL (ref 2.5–4.6)

## 2022-11-07 LAB — MAGNESIUM: Magnesium: 2.5 mg/dL — ABNORMAL HIGH (ref 1.7–2.4)

## 2022-11-07 MED ORDER — BOOST / RESOURCE BREEZE PO LIQD CUSTOM
1.0000 | Freq: Three times a day (TID) | ORAL | Status: DC
  Administered 2022-11-07 – 2022-11-19 (×22): 1 via ORAL
  Filled 2022-11-07: qty 1

## 2022-11-07 MED ORDER — BACITRACIN ZINC 500 UNIT/GM EX OINT
TOPICAL_OINTMENT | Freq: Two times a day (BID) | CUTANEOUS | Status: DC
  Administered 2022-11-07 – 2022-11-19 (×20): 31.5 via TOPICAL
  Filled 2022-11-07: qty 28.4

## 2022-11-07 MED ORDER — PROSOURCE PLUS PO LIQD
30.0000 mL | Freq: Two times a day (BID) | ORAL | Status: DC
  Administered 2022-11-07 – 2022-11-10 (×7): 30 mL via ORAL
  Filled 2022-11-07 (×9): qty 30

## 2022-11-07 MED ORDER — INSULIN ASPART 100 UNIT/ML IJ SOLN
0.0000 [IU] | Freq: Three times a day (TID) | INTRAMUSCULAR | Status: DC
  Administered 2022-11-07 – 2022-11-13 (×7): 1 [IU] via SUBCUTANEOUS

## 2022-11-07 NOTE — Progress Notes (Signed)
PROGRESS NOTE    Mathew [name]  ZOX:[id number] DOB: [birth year] DOA: 10/30/2022 PCP: Patient, No Pcp Per   Brief Narrative:  72 year old male with PMHx significant for HCC on chemotherapy, paroxysmal atrial fibrillation, hypertension, and hepatitis C status post treatment.  He initially presented to [facility] Emergency department on 6/21 with complaints of sudden onset chest pain and was diagnosed with thrombosed type B thoracic aortic dissection extending from the distal aortic arch to the level of the diaphragm -initially discharged on the 25th due to no expansion, ultimately returning on the 26th for worsening pain where imaging confirmed aortic rupture requiring endovascular repair by vascular surgery.  Hospital Course: 6/25 Discharged with stable type B Ao dissection with intramural thrombosis  6/26 Returned for admittance  with ascending Ao rupture. Emergent OR for repair 6/27 Patient developed left-sided weakness, MRI brain noted for multiple acute infarcts with embolic pattern, MRI C-spine with cord ischemia on the right side.   6/28 Lumbar drain placed 6/30 Started on vasopressor support to maintain SBP greater than 140 7/1 Vascular continue plans to continue lumbar drain per vascular 7/2 Nausea with emesis, . Increased O2 requirement with faint bibasilar crackles. CXR + Lasix x 1. Lumbar drain removed. 7/4 Coretrak clogged -ultimately removed as patient is tolerating p.o.  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  6/26  > L subclavian was covered during surgery 6/28 CT angiogram showed stable repaired segment of aorta -Vascular surgery following, appreciate insight recommendations -Blood pressure remains moderately well-controlled, has been off pressors since 11/04/2022 -spinal drain remove 7/2   Acute bilateral multifocal embolic/ischemic strokes MRI Brain showed bilateral multifocal embolic ischemic stroke involving right cerebellar splenium of the corpus callosum on the left, right occipital lobe, right parietal lobe, and left lentiform nucleus  Cervical spinal cord edema, concern for infarct MRI C-spine showing spinal cord signal in the C6-C7, edema versus stroke. Ischemic stroke of brain does not explain his left leg weakness, this could be related to spinal cord infarction.  Acute transient metabolic encephalopathy  Rule out hospital delirium  -Appreciate stroke team's management. Secondary stroke prevention, focus on rehab. -aspirin, statin -neuro checks -DVT prophylaxis resumed with heparin -No indication for further imaging   Acute kidney injury likely ATN in the setting of hemodynamic insults, resolved Hyponatremia NAG acidosis 2/2 renal failure -Increase p.o. 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 PTA on atezolizumab plus bevacizumab every 3 weeks. Will need outpatient follow-up with oncology. Atezolizumab has been associated with aortic dissection previously per FDA drug information sheet (combined in the 6% fatal adverse outcomes associated with the drug). Retrospective trials have also shown associations  with dissections associated with bevacizumab, but the FDA has not included this in the drug information sheet to  date.  -discussed with his wife today that his oncologist needs to know about his dissection and make a determination of therapy change is needed   Goals of care Patient has multiple comorbidities including metastatic cancer and new multiple infarcts in the setting of type B dissection, ongoing goals of care will be important.   -ongoing goals of are discussions if he fails to make improvements; family understands that his cancer will progress while he is unable to get treatment for it while he is recovery.   DVT prophylaxis: heparin injection 5,000 Units Start: 11/06/22 1400 SCDs Start: 10/30/22 1959 Code Status:   Code Status: Full Code Family Communication: None present  Status is: Inpatient  Dispo: The patient is from: Home              Anticipated d/c is to: Inpatient rehab              Anticipated d/c date is: 24 to 48 hours              Patient currently is medically stable for discharge  Consultants:  Vascular surgery, PCCM  Procedures:  Endovascular repair of ruptured thoracic aortic aneurysm with coverage of left subclavian artery   Antimicrobials:  Perioperatively  Subjective: No acute issues or events overnight, pain currently well-controlled, strength somewhat improving but not drastically  Objective: Vitals:   11/06/22 2311 11/07/22 0305 11/07/22 0659 11/07/22 0805  BP: (!) 145/83 139/72  (!) 142/90  Pulse: 99 94  100  Resp: 18 15  19   Temp: 98.6 F (37 C) 98.3 F (36.8 C)  98.6 F (37 C)  TempSrc: Oral Oral  Oral  SpO2: 96% 95%  96%  Weight:   90.3 kg   Height:        Intake/Output Summary (Last 24 hours) at 11/07/2022 0817 Last data filed at 11/07/2022 1610 Gross per 24 hour  Intake 60.16 ml  Output 2160 ml  Net -2099.84 ml   Filed Weights   11/05/22 0500 11/06/22 0329 11/07/22 0659  Weight: 95.6 kg 92.6 kg 90.3 kg     Examination:  General:  Pleasantly resting in bed, No acute distress. HEENT:  Normocephalic atraumatic.  Sclerae nonicteric, noninjected.  Extraocular movements intact bilaterally. Neck:  Without mass or deformity.  Trachea is midline. Lungs:  Clear to auscultate bilaterally without rhonchi, wheeze, or rales. Heart:  Regular rate and rhythm.  Without murmurs, rubs, or gallops. Abdomen:  Soft, nontender, nondistended.  Without guarding or rebound. Extremities: Strength diminished globally, right lower extremity 3/5, left lower extremity 2/5, bilateral upper extremities 4/5 Skin:  Warm and dry, no erythema.  Data Reviewed: I have personally reviewed following labs and imaging studies  CBC: Recent Labs  Lab 11/02/22 0410 11/03/22 0421 11/04/22 0413 11/05/22 0359 11/06/22 0207  WBC 16.6* 15.8* 15.0* 14.5* 14.7*  HGB 14.1 13.4 12.4* 12.6* 13.1  HCT 41.9 40.8 37.6* 38.0* 39.7  MCV 80.1 80.0 79.7* 81.0 78.8*  PLT 198 227 234 260 309   Basic Metabolic Panel: Recent Labs  Lab 11/03/22 0421 11/04/22 0413 11/05/22 0359 11/06/22 0207 11/07/22 0046  NA 129* 130* 130* 132* 131*  K 3.9 4.0 4.3 4.3 4.6  CL 98 100 99 100 100  CO2 22 20* 20* 24 20*  GLUCOSE 76 104* 83 89 107*  BUN 46* 34* 31* 33* 41*  CREATININE 1.86* 1.37* 1.19 1.14 1.24  CALCIUM 8.3* 8.0* 8.1* 8.4* 8.4*  MG 2.2 2.1 1.9 2.5* 2.5*  PHOS  --   --  2.8 3.5 3.1   GFR: Estimated Creatinine Clearance: 66.3 mL/min (by C-G formula based on SCr of 1.24 mg/dL).  Recent Labs  Lab 11/05/22 1724  AMMONIA 25   Coagulation Profile: Recent Labs  Lab 11/01/22 1551  INR 1.3*   CBG: Recent Labs  Lab 11/06/22 1628 11/06/22 1945 11/06/22 2336 11/07/22 0403 11/07/22 0804  GLUCAP 86 119* 135* 88 94   Recent Results (from the past 240 hour(s))  Surgical pcr screen     Status: None   Collection Time: 10/30/22  4:26 PM   Specimen: Nasal Mucosa; Nasal Swab  Result Value Ref Range Status   MRSA, PCR NEGATIVE  NEGATIVE Final   Staphylococcus aureus NEGATIVE NEGATIVE Final    Comment: (NOTE) The Xpert SA Assay (FDA approved for NASAL specimens in patients 7 years of age and older), is one component of a comprehensive surveillance program. It is not intended to diagnose infection nor to guide or monitor treatment. Performed at [facility], [address], [place], [place] [address]          Radiology Studies: DG Abd 1 View  Result Date: 11/05/2022 CLINICAL DATA:  Nasogastric tube placement EXAM: ABDOMEN - 1 VIEW COMPARISON:  11/05/2022 at 2:20 p.m. FINDINGS: A feeding tube is present coils once in the stomach with tip in the stomach body oriented towards the cardia. Nasogastric tube side port and tip are in the stomach body. Descending thoracic aortic stents noted. Left greater than right basilar airspace opacities, pneumonia is not excluded. IMPRESSION: 1. Feeding tube coils once in the stomach with tip in the stomach body oriented towards the cardia. 2. Nasogastric tube side port and tip in the stomach body. 3. Left greater than right basilar airspace opacities, pneumonia is not excluded. Electronically Signed   By: Gaylyn Rong M.D.   On: 11/05/2022 19:51   DG Abd 1 View  Result Date: 11/05/2022 CLINICAL DATA:  Abdominal distention EXAM: ABDOMEN - 1 VIEW COMPARISON:  None Available. FINDINGS: Nonobstructive pattern of bowel gas with gas present to the rectum. Scattered stool in the left colon. No obvious free air on supine radiograph. Probable enteric feeding tube, incompletely imaged at the upper extent of the exam. IMPRESSION: Nonobstructive pattern of bowel gas with gas present to the rectum. Scattered stool in the left colon. Electronically Signed   By: Jearld Lesch M.D.   On: 11/05/2022 15:14   DG CHEST PORT 1 VIEW  Result Date: 11/05/2022 CLINICAL DATA:  Shortness of breath EXAM: PORTABLE CHEST 1 VIEW COMPARISON:  10/30/2022 FINDINGS: Redemonstrated endoluminal stent within  the aortic arch and descending thoracic aorta. Unchanged cardiac and mediastinal contours. Redemonstrated interstitial and ground-glass opacities. Likely trace right pleural effusion. No definite left pleural effusion. No pneumothorax. No acute osseous abnormality. Enteric tube terminates in the stomach. IMPRESSION: 1. Unchanged appearance of endoluminal stent and cardiomediastinal contours. 2. Redemonstrated interstitial and ground-glass opacities, possibly mild pulmonary edema. Likely trace right pleural effusion. 3. Enteric tube terminates in the stomach. Electronically Signed   By: Wiliam Ke M.D.   On: 11/05/2022 13:09        Scheduled Meds:  aspirin  81 mg Oral Daily   atorvastatin  80 mg Oral QHS   Chlorhexidine Gluconate Cloth  6 each Topical Daily   docusate sodium  100 mg Oral Daily   feeding supplement (PROSource TF20)  60 mL Per Tube BID   heparin injection (subcutaneous)  5,000 Units Subcutaneous Q8H   insulin aspart  0-9 Units  Subcutaneous TID AC & HS   melatonin  3 mg Oral QHS   pantoprazole  40 mg Oral Daily   polyethylene glycol  17 g Oral Daily   thiamine  100 mg Per Tube Daily   Continuous Infusions:  sodium chloride     sodium chloride 10 mL/hr at 11/06/22 1027   feeding supplement (VITAL 1.5 CAL) Stopped (11/05/22 1415)   norepinephrine (LEVOPHED) Adult infusion Stopped (11/04/22 1901)     LOS: 8 days   Time spent:  Azucena Fallen, DO [facility]  If 7PM-7AM, please contact night-coverage [email or web address]  11/07/2022, 8:17 AM

## 2022-11-07 NOTE — Progress Notes (Addendum)
Nutrition Brief Note  Visited patient at bedside who is no longer getting TF's. NG no longer in place. Patient is tolerating Dysphagia 3 diet. He reports he had eaten a little bit this morning. He declined Ensure although RD informed him that it will help keep him nourished while in-patient. He requested Boost Breeze.   Patient denied N/V/D/C.  Interventions  Boost Breeze po TID, each supplement provides 250 kcal and 9 grams of protein  Encourage po intake   Leodis Rains, RDN, LDN  Clinical Nutrition

## 2022-11-07 NOTE — Progress Notes (Addendum)
  Progress Note    11/07/2022 9:39 AM 8 Days Post-Op  Subjective:  legs are about the same. No real complaints this morning    Vitals:   11/07/22 0305 11/07/22 0805  BP: 139/72 (!) 142/90  Pulse: 94 100  Resp: 15 19  Temp: 98.3 F (36.8 C) 98.6 F (37 C)  SpO2: 95% 96%   Physical Exam: Cardiac:  regular Lungs:  non labored Extremities:  BLE well perfused and warm with palpable DP and radial pulses bilaterally Abdomen:  soft Neurologic: alert and oriented  CBC    Component Value Date/Time   WBC 14.7 (H) 11/06/2022 0207   RBC 5.04 11/06/2022 0207   HGB 13.1 11/06/2022 0207   HGB 18.4 (H) 10/23/2022 1417   HCT 39.7 11/06/2022 0207   PLT 309 11/06/2022 0207   PLT 238 10/23/2022 1417   MCV 78.8 (L) 11/06/2022 0207   MCH 26.0 11/06/2022 0207   MCHC 33.0 11/06/2022 0207   RDW 18.4 (H) 11/06/2022 0207   LYMPHSABS 3.0 10/23/2022 1417   MONOABS 0.7 10/23/2022 1417   EOSABS 0.1 10/23/2022 1417   BASOSABS 0.2 (H) 10/23/2022 1417    BMET    Component Value Date/Time   NA 131 (L) 11/07/2022 0046   K 4.6 11/07/2022 0046   CL 100 11/07/2022 0046   CO2 20 (L) 11/07/2022 0046   GLUCOSE 107 (H) 11/07/2022 0046   BUN 41 (H) 11/07/2022 0046   CREATININE 1.24 11/07/2022 0046   CREATININE 1.25 (H) 10/23/2022 1417   CALCIUM 8.4 (L) 11/07/2022 0046   GFRNONAA >60 11/07/2022 0046   GFRNONAA >60 10/23/2022 1417   GFRAA >60 06/12/2018 0815    INR    Component Value Date/Time   INR 1.3 (H) 11/01/2022 1551     Intake/Output Summary (Last 24 hours) at 11/07/2022 0939 Last data filed at 11/07/2022 0102 Gross per 24 hour  Intake 30.16 ml  Output 2100 ml  Net -2069.84 ml     Assessment/Plan:  72 y.o. male is s/p emergent repair of ruptured left thoracic aortic aneurysm with endovascular stent graft and laser fenestration and subsequent stenting to the left subclavian artery 8 Days Post-Op  Extremities remain well perfused with palpable DP and radial pulses PT/OT  recommending CIR if he continues to progress Core tract removed this morning. Diet per primary team Otherwise stable from vascular standpoint  DVT prophylaxis: sq heparin  Graceann Congress, PA-C [facility] [phone] 11/07/2022 9:39 AM  I have independently examined patient and agree with PA assessment and plan above.   Florena Kozma C. Randie Heinz, MD [facility] [place] Office: [phone] Pager: [phone]

## 2022-11-07 NOTE — Progress Notes (Signed)
Physical Therapy Treatment Patient Details Name: Mathew [name] MRN: [id number] DOB: Oct 06, 1950 Today's Date: 11/07/2022   History of Present Illness Pt is a 72 y/o M presenting to ED on 6/21 with chest pain and EKG with concern for ST elevation d/c'd 6/25, returned 6/26 and was urgently taken to OR. 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  Post op day 1 pt with L sided weakness, concern for spinal cord ischemia vs CVA. 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    PT Comments  Pt received in supine and agreeable to session. Pt demonstrating improved bed mobility this session requiring up to min A. Pt with improved ability to advance LLE and elevate trunk. Pt able to stand from various surfaces with mod-max A +2 due to BLE weakness and demonstrates limited standing tolerance. Pt with one unsuccessful stand from [facility] due to lower height, requiring increased assist. Pt able to have successful BM on [facility] and stand for assist with pericare. Pt continues to be limited by weakness and fatigue.  Pt's LLE noted to externally rotate in resting, so pt would benefit from prevalon boots to prevent stiffness and protect heels from pressure sores. Pt continues to benefit from PT services to progress toward functional mobility goals.     Assistance Recommended at Discharge Frequent or constant Supervision/Assistance  If plan is discharge home, recommend the following:  Can travel by private vehicle    A lot of help with walking and/or transfers;A lot of help with bathing/dressing/bathroom;Assist for transportation;Help with stairs or ramp for entrance      Equipment Recommendations   (TBD at next  venue)    Recommendations for Other Services       Precautions / Restrictions Precautions Precautions: Fall Precaution Comments: cortrak, O2, NG tube Restrictions Weight Bearing Restrictions: No     Mobility  Bed Mobility Overal bed mobility: Needs Assistance Bed Mobility: Supine to Sit     Supine to sit: Min assist, HOB elevated     General bed mobility comments: Min A for elevation of LLE for pt to advance to EOB and trunk elevation. Cues for sequencing and technique.    Transfers Overall transfer level: Needs assistance Equipment used: Ambulation equipment used (stedy) Transfers: Sit to/from Stand, Bed to chair/wheelchair/BSC Sit to Stand: Mod assist, Max assist, +2 physical assistance           General transfer comment: From elevated EOB and stedy paddles wtih mod A +2 and increased time for power up. From [facility] with max A +2 due to lower surface after unsuccessful attempt. Transfer via Lift Equipment: Stedy  Ambulation/Gait               General Gait Details: unable       Balance Overall balance assessment: Needs assistance Sitting-balance support: Feet supported, Single extremity supported Sitting balance-Leahy Scale: Fair Sitting balance - Comments: sitting EOB with initial posterior lean, but pt is able to correct with cues.   Standing balance support: During functional activity, Reliant on assistive device for balance, Bilateral upper extremity supported Standing balance-Leahy Scale: Poor Standing balance comment: with stedy support  Cognition Arousal/Alertness: Awake/alert Behavior During Therapy: Flat affect Overall Cognitive Status: Within Functional Limits for tasks assessed                                          Exercises      General Comments General comments (skin integrity, edema, etc.): Pt's SpO2 noted to drop to 85% during transfers, however improved quickly with cues for  pursed lip breathing. Pt with tachycardia at end of session, RN present and aware. Pt able to have BM on [facility]      Pertinent Vitals/Pain Pain Assessment Pain Assessment: Faces Faces Pain Scale: Hurts a little bit Pain Location: back Pain Descriptors / Indicators: Discomfort, Sore Pain Intervention(s): Monitored during session, Repositioned     PT Goals (current goals can now be found in the care plan section) Acute Rehab PT Goals Patient Stated Goal: get better PT Goal Formulation: With patient/family Time For Goal Achievement: 11/19/22 Potential to Achieve Goals: Good Progress towards PT goals: Progressing toward goals    Frequency    Min 1X/week      PT Plan Current plan remains appropriate       AM-PAC PT "6 Clicks" Mobility   Outcome Measure  Help needed turning from your back to your side while in a flat bed without using bedrails?: A Little Help needed moving from lying on your back to sitting on the side of a flat bed without using bedrails?: A Little Help needed moving to and from a bed to a chair (including a wheelchair)?: Total Help needed standing up from a chair using your arms (e.g., wheelchair or bedside chair)?: Total Help needed to walk in hospital room?: Total Help needed climbing 3-5 steps with a railing? : Total 6 Click Score: 10    End of Session Equipment Utilized During Treatment: Gait belt Activity Tolerance: Patient limited by fatigue Patient left: in chair;with call bell/phone within reach;with nursing/sitter in room Nurse Communication: Mobility status;Need for lift equipment PT Visit Diagnosis: Unsteadiness on feet (R26.81);Muscle weakness (generalized) (M62.81);Difficulty in walking, not elsewhere classified (R26.2)     Time: 1610-9604 PT Time Calculation (min) (ACUTE ONLY): 25 min  Charges:    $Therapeutic Activity: 23-37 mins PT General Charges $$ ACUTE PT VISIT: 1 Visit                     Johny Shock, PTA Acute  Rehabilitation Services Secure Chat Preferred  Office:([phone]    Johny Shock 11/07/2022, 12:53 PM

## 2022-11-07 NOTE — Progress Notes (Signed)
Small bore feeding tube(core tract) clogged unable to flush,scheduled pro source not given.Dr Laban Emperor made aware,will leave  the tube and address it in the morning per Dr. Julian Reil. Will continue to monitor.

## 2022-11-08 DIAGNOSIS — I71019 Dissection of thoracic aorta, unspecified: Secondary | ICD-10-CM | POA: Diagnosis not present

## 2022-11-08 LAB — BASIC METABOLIC PANEL
Anion gap: 9 (ref 5–15)
BUN: 37 mg/dL — ABNORMAL HIGH (ref 8–23)
CO2: 21 mmol/L — ABNORMAL LOW (ref 22–32)
Calcium: 8.2 mg/dL — ABNORMAL LOW (ref 8.9–10.3)
Chloride: 102 mmol/L (ref 98–111)
Creatinine, Ser: 1.1 mg/dL (ref 0.61–1.24)
GFR, Estimated: >60 mL/min (ref >60)
Glucose, Bld: 119 mg/dL — ABNORMAL HIGH (ref 70–99)
Potassium: 3.6 mmol/L (ref 3.5–5.1)
Sodium: 132 mmol/L — ABNORMAL LOW (ref 135–145)

## 2022-11-08 LAB — GLUCOSE, CAPILLARY
Glucose-Capillary: 92 mg/dL (ref 70–99)
Glucose-Capillary: 125 mg/dL — ABNORMAL HIGH (ref 70–99)
Glucose-Capillary: 116 mg/dL — ABNORMAL HIGH (ref 70–99)
Glucose-Capillary: 181 mg/dL — ABNORMAL HIGH (ref 70–99)
Glucose-Capillary: 104 mg/dL — ABNORMAL HIGH (ref 70–99)

## 2022-11-08 LAB — MAGNESIUM: Magnesium: 2.3 mg/dL (ref 1.7–2.4)

## 2022-11-08 LAB — PHOSPHORUS: Phosphorus: 3 mg/dL (ref 2.5–4.6)

## 2022-11-08 NOTE — Progress Notes (Signed)
Inpatient Rehab Admissions Coordinator:  ? ?Per therapy recommendations,  patient was screened for CIR candidacy by Yovanna Cogan, MS, CCC-SLP. At this time, Pt. Appears to be a a potential candidate for CIR. I will place   order for rehab consult per protocol for full assessment. Please contact me any with questions. ? ?Colbey Wirtanen, MS, CCC-SLP ?Rehab Admissions Coordinator  ?[phone] (celll) ?[phone] (office) ? ?

## 2022-11-08 NOTE — Progress Notes (Addendum)
[facility]  Subjective  - No new complaints, left leg still weak, but improving.   Objective (!) 158/76 93 99.2 F (37.3 C) (Oral) 20 99%  Intake/Output Summary (Last 24 hours) at 11/08/2022 1610 Last data filed at 11/08/2022 0424 Gross per 24 hour  Intake 267.33 ml  Output 700 ml  Net -432.67 ml   Palpable DP B LE Weakness left LE without change  Lungs non labored breathing    Assessment/Planning:  73 y.o. male is s/p emergent repair of ruptured left thoracic aortic aneurysm with endovascular stent graft and laser fenestration and subsequent stenting to the left subclavian artery 9 Days Post-Op   Pending continued rehab PT/OT  Excellent inflow with palpable DP pulses B LE Stable from a vascular point of view F/U sent to office  Mosetta Pigeon 11/08/2022 7:14 AM --  Laboratory Lab Results: Recent Labs    11/06/22 0207  WBC 14.7*  HGB 13.1  HCT 39.7  PLT 309   BMET Recent Labs    11/07/22 0046 11/08/22 0124  NA 131* 132*  K 4.6 3.6  CL 100 102  CO2 20* 21*  GLUCOSE 107* 119*  BUN 41* 37*  CREATININE 1.24 1.10  CALCIUM 8.4* 8.2*    COAG Lab Results  Component Value Date   INR 1.3 (H) 11/01/2022   INR 1.3 (H) 10/25/2022   INR 1.0 09/02/2022   No results found for: "PTT"  I agree with the above.  I have seen the patient. Continue to work with PT.  I would prefer hi to do inpatient rehab if at all possible  Durene Cal

## 2022-11-08 NOTE — Progress Notes (Signed)
Physical Therapy Treatment Patient Details Name: Mathew [name] MRN: [id number] DOB: 1950-07-17 Today's Date: 11/08/2022   History of Present Illness Pt is a 72 y/o M presenting to ED on 6/21 with chest pain and EKG with concern for ST elevation d/c'd 6/25, returned 6/26 and was urgently taken to OR. 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  Post op day 1 pt with L sided weakness, concern for spinal cord ischemia vs CVA. 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    PT Comments  Pt received in supine and agreeable to session. Pt demonstrating good progress towards functional mobility goals this session. Pt able to sit to EOB with light min A for trunk elevation, however demonstrates improved LLE management. Pt able to stand from elevated EOB with min A for power up. Pt requiring increased assist to stand from recliner due to lower surface and is able to perform 3 trials. Pt able to march in the stedy with cues for increased BUE support and elbow extension due to pt flexing trunk with increased fatigue. Pt continues to benefit from PT services to progress toward functional mobility goals.      Assistance Recommended at Discharge Frequent or constant Supervision/Assistance  If plan is discharge home, recommend the following:  Can travel by private vehicle    A lot of help with walking and/or transfers;A lot of help with bathing/dressing/bathroom;Assist for transportation;Help with stairs or ramp for entrance      Equipment Recommendations   (TBD at next venue)    Recommendations for Other Services       Precautions / Restrictions Precautions Precautions:  Fall Restrictions Weight Bearing Restrictions: No     Mobility  Bed Mobility Overal bed mobility: Needs Assistance Bed Mobility: Supine to Sit     Supine to sit: Min assist, HOB elevated     General bed mobility comments: Min A for trunk elevation. Pt able to advance LLE to EOB without assist. Cues for use of bedrail    Transfers Overall transfer level: Needs assistance Equipment used: Ambulation equipment used (stedy) Transfers: Sit to/from Stand, Bed to chair/wheelchair/BSC Sit to Stand: Mod assist, Min assist, +2 safety/equipment, +2 physical assistance           General transfer comment: From elevated EOB x1 with min A +2 for safety and from recliner x3 with mod A +2 for power up due to lower surface. Pt able to perform marching while standing with cues for increased BUE support due to pt with increased trunk flexion. Cues for glute activation with pt demonstrating little improvement. Pt requiring up to min A for standing balance. Transfer via Lift Equipment: Stedy  Ambulation/Gait             Pre-gait activities: marching and weight shifting General Gait Details: unable      Balance Overall balance assessment: Needs assistance Sitting-balance support: Feet supported, Single extremity supported Sitting balance-Leahy Scale: Fair Sitting balance - Comments: sitting EOB   Standing balance support: During functional activity, Reliant on assistive device for balance, Bilateral upper extremity supported Standing balance-Leahy Scale: Poor Standing balance comment: with stedy support. Pt demonstrating trunk flexion and requiring min A for balance during single UE reaching task  Cognition Arousal/Alertness: Awake/alert Behavior During Therapy: Flat affect Overall Cognitive Status: Within Functional Limits for tasks assessed                                          Exercises      General Comments General  comments (skin integrity, edema, etc.): VSS on RA      Pertinent Vitals/Pain Pain Assessment Pain Assessment: Faces Faces Pain Scale: Hurts a little bit Pain Location: back Pain Descriptors / Indicators: Discomfort, Sore Pain Intervention(s): Monitored during session, Repositioned     PT Goals (current goals can now be found in the care plan section) Acute Rehab PT Goals Patient Stated Goal: get better PT Goal Formulation: With patient/family Time For Goal Achievement: 11/19/22 Potential to Achieve Goals: Good Progress towards PT goals: Progressing toward goals    Frequency    Min 1X/week      PT Plan Current plan remains appropriate    Co-evaluation PT/OT/SLP Co-Evaluation/Treatment: Yes Reason for Co-Treatment: To address functional/ADL transfers;For patient/therapist safety;Complexity of the patient's impairments (multi-system involvement) PT goals addressed during session: Mobility/safety with mobility;Balance;Strengthening/ROM        AM-PAC PT "6 Clicks" Mobility   Outcome Measure  Help needed turning from your back to your side while in a flat bed without using bedrails?: A Little Help needed moving from lying on your back to sitting on the side of a flat bed without using bedrails?: A Little Help needed moving to and from a bed to a chair (including a wheelchair)?: Total Help needed standing up from a chair using your arms (e.g., wheelchair or bedside chair)?: Total Help needed to walk in hospital room?: Total Help needed climbing 3-5 steps with a railing? : Total 6 Click Score: 10    End of Session Equipment Utilized During Treatment: Gait belt Activity Tolerance: Patient tolerated treatment well Patient left: in chair;with call bell/phone within reach;with family/visitor present Nurse Communication: Mobility status PT Visit Diagnosis: Unsteadiness on feet (R26.81);Muscle weakness (generalized) (M62.81);Difficulty in walking, not elsewhere classified  (R26.2)     Time: 1610-9604 PT Time Calculation (min) (ACUTE ONLY): 27 min  Charges:    $Therapeutic Activity: 8-22 mins PT General Charges $$ ACUTE PT VISIT: 1 Visit                     Mathew [name], PTA Acute Rehabilitation Services Secure Chat Preferred  Office:([phone]    Mathew [name] 11/08/2022, 10:01 AM

## 2022-11-08 NOTE — Progress Notes (Signed)
PROGRESS NOTE    Mathew [name]  EAV:[id number] DOB: 1950/09/27 DOA: 10/30/2022 PCP: Patient, No Pcp Per   Brief Narrative:  72 year old male with PMHx significant for HCC on chemotherapy, paroxysmal atrial fibrillation, hypertension, and hepatitis C status post treatment.  He initially presented to [facility] Emergency department on 6/21 with complaints of sudden onset chest pain and was diagnosed with thrombosed type B thoracic aortic dissection extending from the distal aortic arch to the level of the diaphragm -initially discharged on the 25th due to no expansion, ultimately returning on the 26th for worsening pain where imaging confirmed aortic rupture requiring endovascular repair by vascular surgery.  Hospital Course: 6/25 Discharged with stable type B Ao dissection with intramural thrombosis  6/26 Returned for admittance  with ascending Ao rupture. Emergent OR for repair 6/27 Patient developed left-sided weakness, MRI brain noted for multiple acute infarcts with embolic pattern, MRI C-spine with cord ischemia on the right side.   6/28 Lumbar drain placed 6/30 Started on vasopressor support to maintain SBP greater than 140 7/1 Vascular continue plans to continue lumbar drain per vascular 7/2 Nausea with emesis, . Increased O2 requirement with faint bibasilar crackles. CXR + Lasix x 1. Lumbar drain removed. 7/4 Coretrak clogged -ultimately removed as patient is tolerating p.o. 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  6/26 > L subclavian was covered during surgery 6/28 CT angiogram showed stable repaired segment of aorta -Vascular surgery following, appreciate insight recommendations -Blood pressure remains moderately well-controlled, has been off pressors since 11/04/2022 -spinal drain removed 7/2 without incident   Acute bilateral multifocal embolic/ischemic strokes MRI Brain showed bilateral multifocal embolic ischemic stroke involving right cerebellar splenium of the corpus callosum on the left, right occipital lobe, right parietal lobe, and left lentiform nucleus -Continues to improve with ongoing physical therapy  Cervical spinal cord edema, concern for infarct MRI C-spine showing spinal cord signal in the C6-C7, edema versus stroke.  Most likely etiology for L>R leg weakness, now improving  Acute transient metabolic encephalopathy  Rule out hospital delirium  -Appreciate stroke team's management. Secondary stroke prevention, focus on rehab. -aspirin, statin ongoing per core measures -DVT prophylaxis resumed with heparin -No indication for further imaging   Acute kidney injury likely ATN in the setting of hemodynamic insults, resolved Hyponatremia NAG acidosis 2/2 renal failure -Increase p.o. intake as appropriate -Aspirin, statin   Questionable COPD(no formal diagnosis) Prolonged history of tobacco abuse OSA -Discussed smoking cessation -Off oxygen as of 7/4 -Out of bed, continue PT OT pending any restrictions given prior spinal drain.   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  -On atezolizumab plus bevacizumab every 3 weeks prior to admission.  - Will need outpatient follow-up with oncology. Atezolizumab  has been associated with aortic dissection previously per FDA drug information sheet (combined in the 6% fatal adverse outcomes associated with  the drug). Retrospective trials have also shown associations with dissections associated with bevacizumab, but the FDA has not included this in the drug information sheet to date.  -Previous team discussed with wife that oncologist should be aware of dissection as this may change course of therapy    Goals of care Patient has multiple comorbidities including metastatic cancer and new multiple infarcts in the setting of type B dissection, ongoing goals of care discussion moving forward however currently patient and family are focused on short-term goals of ambulation and independence. -long-term goals in regards to cancer are ongoing pending resolution of above; family understands that his cancer will continue to progress while he is unable to get treatment for it while he is recovery.   DVT prophylaxis: heparin injection 5,000 Units Start: 11/06/22 1400 SCDs Start: 10/30/22 1959 Code Status:   Code Status: Full Code Family Communication: None present  Status is: Inpatient  Dispo: The patient is from: Home              Anticipated d/c is to: Inpatient rehab              Anticipated d/c date is: 24 to 48 hours              Patient currently is medically stable for discharge  Consultants:  Vascular surgery, PCCM  Procedures:  Endovascular repair of ruptured thoracic aortic aneurysm with coverage of left subclavian artery   Antimicrobials:  Perioperatively  Subjective: No acute issues or events overnight, pain currently well-controlled, strength somewhat improving but not drastically  Objective: Vitals:   11/07/22 1932 11/07/22 2342 11/08/22 0423 11/08/22 0748  BP: (!) 151/83 (!) 149/81 (!) 158/76 (!) 152/77  Pulse: 93 (!) 102 93 96  Resp: 18 20 20 17   Temp: 98.4 F (36.9 C) 98.4 F (36.9 C) 99.2 F (37.3 C) 98.9 F (37.2 C)  TempSrc:  Oral Oral Oral Oral  SpO2: 94% 95% 99% 93%  Weight:   92.2 kg   Height:        Intake/Output Summary (Last 24 hours) at 11/08/2022 0807 Last data filed at 11/08/2022 0424 Gross per 24 hour  Intake 267.33 ml  Output 500 ml  Net -232.67 ml    Filed Weights   11/06/22 0329 11/07/22 0659 11/08/22 0423  Weight: 92.6 kg 90.3 kg 92.2 kg    Examination:  General:  Pleasantly resting in bed, No acute distress. HEENT:  Normocephalic atraumatic.  Sclerae nonicteric, noninjected.  Extraocular movements intact bilaterally. Neck:  Without mass or deformity.  Trachea is midline. Lungs:  Clear to auscultate bilaterally without rhonchi, wheeze, or rales. Heart:  Regular rate and rhythm.  Without murmurs, rubs, or gallops. Abdomen:  Soft, nontender, nondistended.  Without guarding or rebound. Extremities: Strength diminished globally, right lower extremity 4/5, left lower extremity 35, bilateral upper extremities 4/5 Skin:  Warm and dry, no erythema.  Data Reviewed: I have personally reviewed following labs and imaging studies  CBC: Recent Labs  Lab 11/02/22 0410 11/03/22 0421 11/04/22 0413 11/05/22 0359 11/06/22 0207  WBC 16.6* 15.8* 15.0* 14.5* 14.7*  HGB 14.1 13.4 12.4* 12.6* 13.1  HCT 41.9 40.8 37.6* 38.0* 39.7  MCV 80.1 80.0 79.7* 81.0 78.8*  PLT 198 227 234 260 309    Basic Metabolic Panel: Recent Labs  Lab 11/04/22 0413 11/05/22 0359 11/06/22 0207 11/07/22 0046 11/08/22 0124  NA 130* 130* 132* 131* 132*  K 4.0 4.3 4.3 4.6 3.6  CL 100 99 100 100 102  CO2 20* 20* 24 20* 21*  GLUCOSE 104* 83 89 107* 119*  BUN 34* 31* 33* 41* 37*  CREATININE 1.37* 1.19 1.14 1.24 1.10  CALCIUM 8.0* 8.1* 8.4* 8.4* 8.2*  MG 2.1 1.9 2.5* 2.5* 2.3  PHOS  --  2.8 3.5 3.1 3.0    GFR: Estimated Creatinine Clearance: 74.7 mL/min (by C-G formula based on SCr of 1.1 mg/dL).  Recent Labs  Lab 11/05/22 1724  AMMONIA 25    Coagulation Profile: Recent Labs  Lab 11/01/22 1551  INR 1.3*     CBG: Recent Labs  Lab 11/07/22 0804 11/07/22 1133 11/07/22 1609 11/07/22 2113 11/08/22 0620  GLUCAP 94 123* 98 113* 92    Recent Results (from the past 240 hour(s))  Surgical pcr screen     Status: None   Collection Time: 10/30/22  4:26 PM   Specimen: Nasal Mucosa; Nasal Swab  Result Value Ref Range Status   MRSA, PCR NEGATIVE NEGATIVE Final   Staphylococcus aureus NEGATIVE NEGATIVE Final    Comment: (NOTE) The Xpert SA Assay (FDA approved for NASAL specimens in patients 22 years of age and older), is one component of a comprehensive surveillance program. It is not intended to diagnose infection nor to guide or monitor treatment. Performed at [facility], [address] [place], [place] [address]          Radiology Studies: No results found.      Scheduled Meds:  (feeding supplement) PROSource Plus  30 mL Oral BID BM   aspirin  81 mg Oral Daily   atorvastatin  80 mg Oral QHS   bacitracin   Topical BID   Chlorhexidine Gluconate Cloth  6 each Topical Daily   docusate sodium  100 mg Oral Daily   feeding supplement  1 Container Oral TID BM   heparin injection (subcutaneous)  5,000 Units Subcutaneous Q8H   insulin aspart  0-9 Units Subcutaneous TID AC & HS   melatonin  3 mg Oral QHS   pantoprazole  40 mg Oral Daily   polyethylene glycol  17 g Oral Daily   thiamine  100 mg Per Tube Daily   Continuous Infusions:  sodium chloride     sodium chloride Stopped (11/06/22 1304)   norepinephrine (LEVOPHED) Adult infusion Stopped (11/04/22 1901)     LOS: 9 days   Time spent:  Azucena Fallen, DO [facility]  If 7PM-7AM, please contact night-coverage [email or web address]  11/08/2022, 8:07 AM

## 2022-11-08 NOTE — Progress Notes (Signed)
Mathew [name]   DOB:13-May-1950   GE#:[id number]   MWN#:[id number]  Med/onc follow up note   Subjective: Patient is well-known to me, under my care for his liver cancer treatment.  He was recently admitted to hospital on October 25, 2022 for dissection of ascending thoracic aorta, discharged home on October 29, 2022, and readmitted on October 30, 2022.  He was found to have ascending aortic rupture, underwent emergent repair.  He unfortunately was found to have multiple acute infarcts with embolic pattern on June 27, and MRI C spine showed cord ischemia. He is recovering reasonably well, and I will be transferred to inpatient rehab at [facility].  He is alert, oriented and coherent, with normal speech when I saw him today.   Objective:  Vitals:   11/08/22 1540 11/08/22 1939  BP: 134/77 (!) 153/83  Pulse: (!) 105 (!) 103  Resp: 15 16  Temp: 97.7 F (36.5 C) 98.5 F (36.9 C)  SpO2: 96% 96%    Body mass index is 25.41 kg/m.  Intake/Output Summary (Last 24 hours) at 11/08/2022 2216 Last data filed at 11/08/2022 1940 Gross per 24 hour  Intake 480 ml  Output 1450 ml  Net -970 ml     Sclerae unicteric  Oropharynx clear  No peripheral adenopathy  Lungs clear -- no rales or rhonchi  Heart regular rate and rhythm  Abdomen benign  MSK no focal spinal tenderness, no peripheral edema  Neuro nonfocal    CBG (last 3)  Recent Labs    11/08/22 1538 11/08/22 2107 11/08/22 2130  GLUCAP 116* 181* 104*     Labs:   Urine Studies No results for input(s): "UHGB", "CRYS" in the last 72 hours.  Invalid input(s): "UACOL", "UAPR", "USPG", "UPH", "UTP", "UGL", "UKET", "UBIL", "UNIT", "UROB", "ULEU", "UEPI", "UWBC", "URBC", "UBAC", "CAST", "UCOM", "BILUA"  Basic Metabolic Panel: Recent Labs  Lab 11/04/22 0413 11/05/22 0359 11/06/22 0207 11/07/22 0046 11/08/22 0124  NA 130* 130* 132* 131* 132*  K 4.0 4.3 4.3 4.6 3.6  CL 100 99 100 100 102  CO2 20* 20* 24 20* 21*  GLUCOSE 104* 83 89 107*  119*  BUN 34* 31* 33* 41* 37*  CREATININE 1.37* 1.19 1.14 1.24 1.10  CALCIUM 8.0* 8.1* 8.4* 8.4* 8.2*  MG 2.1 1.9 2.5* 2.5* 2.3  PHOS  --  2.8 3.5 3.1 3.0   GFR Estimated Creatinine Clearance: 74.7 mL/min (by C-G formula based on SCr of 1.1 mg/dL). Liver Function Tests: No results for input(s): "AST", "ALT", "ALKPHOS", "BILITOT", "PROT", "ALBUMIN" in the last 168 hours. No results for input(s): "LIPASE", "AMYLASE" in the last 168 hours. Recent Labs  Lab 11/05/22 1724  AMMONIA 25   Coagulation profile No results for input(s): "INR", "PROTIME" in the last 168 hours.  CBC: Recent Labs  Lab 11/02/22 0410 11/03/22 0421 11/04/22 0413 11/05/22 0359 11/06/22 0207  WBC 16.6* 15.8* 15.0* 14.5* 14.7*  HGB 14.1 13.4 12.4* 12.6* 13.1  HCT 41.9 40.8 37.6* 38.0* 39.7  MCV 80.1 80.0 79.7* 81.0 78.8*  PLT 198 227 234 260 309   Cardiac Enzymes: No results for input(s): "CKTOTAL", "CKMB", "CKMBINDEX", "TROPONINI" in the last 168 hours. BNP: Invalid input(s): "POCBNP" CBG: Recent Labs  Lab 11/08/22 0620 11/08/22 1137 11/08/22 1538 11/08/22 2107 11/08/22 2130  GLUCAP 92 125* 116* 181* 104*   D-Dimer No results for input(s): "DDIMER" in the last 72 hours. Hgb A1c No results for input(s): "HGBA1C" in the last 72 hours. Lipid Profile No results for input(s): "  CHOL", "HDL", "LDLCALC", "TRIG", "CHOLHDL", "LDLDIRECT" in the last 72 hours. Thyroid function studies No results for input(s): "TSH", "T4TOTAL", "T3FREE", "THYROIDAB" in the last 72 hours.  Invalid input(s): "FREET3" Anemia work up No results for input(s): "VITAMINB12", "FOLATE", "FERRITIN", "TIBC", "IRON", "RETICCTPCT" in the last 72 hours. Microbiology Recent Results (from the past 240 hour(s))  Surgical pcr screen     Status: None   Collection Time: 10/30/22  4:26 PM   Specimen: Nasal Mucosa; Nasal Swab  Result Value Ref Range Status   MRSA, PCR NEGATIVE NEGATIVE Final   Staphylococcus aureus NEGATIVE NEGATIVE  Final    Comment: (NOTE) The Xpert SA Assay (FDA approved for NASAL specimens in patients 46 years of age and older), is one component of a comprehensive surveillance program. It is not intended to diagnose infection nor to guide or monitor treatment. Performed at [facility], [address] [place], [place] [address]       Studies:  No results found.  Assessment: 72 y.o. male   Acute thoracic aortic dissection Spinal cord ischemia Embolic strokes AKI Metastatic hepatocellular carcinoma Chronic nausea, secondary to his malignancy Generalized fatigue and anorexia    Plan:  -I have reviewed his complicated hospital course -He is actually recovering reasonably well, and is motivated to go to inpatient rehab to regain his strength -Bevacizumab would be contraindicated due to the aorta dissection and embolic stroke -Plan to hold his cancer treatment while he is recovering from acute events, not sure if he is able to get more cancer treatment.  If he recovers well, we can try immunotherapy again, he is not a good candidate for TKI's due to the presence of vascular events. -I will cancel his scheduled treatments, I told the patient to call me when he is ready to be discharged from hospital.  I will see him back in the office. -If he does not recover well, with poor functional status, then we will discuss palliative care and hospice. -I called his wife, no answer, I left her message with the above updates.   Malachy Mood, MD 11/08/2022

## 2022-11-08 NOTE — Progress Notes (Signed)
Inpatient Rehab Admissions Coordinator:  ? ?Per therapy recommendations,  patient was screened for CIR candidacy by Beckhem Isadore, MS, CCC-SLP. At this time, Pt. Appears to be a a potential candidate for CIR. I will place   order for rehab consult per protocol for full assessment. Please contact me any with questions. ? ?Murphy Duzan, MS, CCC-SLP ?Rehab Admissions Coordinator  ?[phone] (celll) ?[phone] (office) ? ?

## 2022-11-08 NOTE — Progress Notes (Signed)
Occupational Therapy Treatment Patient Details Name: Mathew [name] MRN: [id number] DOB: 07-15-1950 Today's Date: 11/08/2022   History of present illness Pt is a 72 y/o M presenting to ED on 6/21 with chest pain and EKG with concern for ST elevation d/c'd 6/25, returned 6/26 and was urgently taken to OR. 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  Post op day 1 pt with L sided weakness, concern for spinal cord ischemia vs CVA. 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   OT comments  Patient demonstrating gain with meeting bed mobility goal with patient requiring min assist to get to EOB. Patient stood from EOB to stedy with mod assist +2 and required assistance for peri area cleaning while standing in [place]. Patient performed standing in stedy with patient attempting reaching tasks on last stand with difficulty maintaining balance without BUE support. Patient will benefit from intensive inpatient follow up therapy, >3 hours/day to address self care and functional transfers. Patient has good potential to meet stated goals. Acute OT to continue to follow.    Recommendations for follow up therapy are one component of a multi-disciplinary discharge planning process, led by the attending physician.  Recommendations may be updated based on patient status, additional functional criteria and insurance authorization.    Assistance Recommended at Discharge Frequent or constant Supervision/Assistance  Patient can return home with the following  Two people to help with walking and/or transfers;A lot of help with bathing/dressing/bathroom;Assistance with cooking/housework;Direct supervision/assist for  medications management;Direct supervision/assist for financial management;Assist for transportation;Help with stairs or ramp for entrance   Equipment Recommendations  Other (comment) (defer)    Recommendations for Other Services      Precautions / Restrictions Precautions Precautions: Fall Restrictions Weight Bearing Restrictions: No       Mobility Bed Mobility Overal bed mobility: Needs Assistance Bed Mobility: Supine to Sit     Supine to sit: Min assist, HOB elevated     General bed mobility comments: min assist with trunk and patient able to move BLEs to EOB    Transfers Overall transfer level: Needs assistance Equipment used: Ambulation equipment used Antony Salmon) Transfers: Sit to/from Stand, Bed to chair/wheelchair/BSC Sit to Stand: Mod assist, Min assist, +2 safety/equipment, +2 physical assistance           General transfer comment: patient requiring mod assist +2 from recliner and min assist +2 from EOB with patient pulling up on Stedy Transfer via Lift Equipment: Stedy   Balance Overall balance assessment: Needs assistance Sitting-balance support: Feet supported, Single extremity supported Sitting balance-Leahy Scale: Fair Sitting balance - Comments: sitting EOB   Standing balance support: During functional activity, Reliant on assistive device for balance, Bilateral upper extremity supported Standing balance-Leahy Scale: Poor Standing balance comment: attempted reaching task while standing in stedy with assistance for balance                           ADL either performed or assessed with clinical judgement   ADL Overall ADL's : Needs assistance/impaired     Grooming: Set up;Sitting;Wash/dry face       Lower Body Bathing: Maximal assistance;Sit to/from stand Lower Body Bathing Details (indicate cue type and reason): assistance  with peri area back while standing                            Extremity/Trunk Assessment               Vision       Perception     Praxis      Cognition Arousal/Alertness: Awake/alert Behavior During Therapy: Flat affect Overall Cognitive Status: Within Functional Limits for tasks assessed                                          Exercises      Shoulder Instructions       General Comments VSS on RA    Pertinent Vitals/ Pain       Pain Assessment Pain Assessment: Faces Faces Pain Scale: Hurts a little bit Pain Location: back Pain Descriptors / Indicators: Discomfort, Sore Pain Intervention(s): Monitored during session, Repositioned  Home Living                                          Prior Functioning/Environment              Frequency  Min 1X/week        Progress Toward Goals  OT Goals(current goals can now be found in the care plan section)  Progress towards OT goals: Progressing toward goals  Acute Rehab OT Goals Patient Stated Goal: get better OT Goal Formulation: With patient Time For Goal Achievement: 11/19/22 Potential to Achieve Goals: Good ADL Goals Pt Will Perform Upper Body Dressing: with min assist;sitting Pt Will Perform Lower Body Dressing: with min assist;sitting/lateral leans;sit to/from stand Pt Will Transfer to Toilet: with min assist;ambulating;regular height toilet Additional ADL Goal #1: pt will be mod A for bed mobility in prep for ADLs  Plan Discharge plan remains appropriate    Co-evaluation    PT/OT/SLP Co-Evaluation/Treatment: Yes Reason for Co-Treatment: To address functional/ADL transfers;For patient/therapist safety;Complexity of the patient's impairments (multi-system involvement) PT goals addressed during session: Mobility/safety with mobility;Balance;Strengthening/ROM OT goals addressed during session: ADL's and self-care      AM-PAC OT "6 Clicks" Daily Activity     Outcome Measure   Help from another person eating meals?: A Little Help from another person taking  care of personal grooming?: A Lot Help from another person toileting, which includes using toliet, bedpan, or urinal?: A Lot Help from another person bathing (including washing, rinsing, drying)?: A Lot Help from another person to put on and taking off regular upper body clothing?: A Lot Help from another person to put on and taking off regular lower body clothing?: A Lot 6 Click Score: 13    End of Session Equipment Utilized During Treatment: Other (comment);Gait belt (Stedy)  OT Visit Diagnosis: Unsteadiness on feet (R26.81);Other abnormalities of gait and mobility (R26.89);Muscle weakness (generalized) (M62.81)   Activity Tolerance Patient tolerated treatment well   Patient Left in chair;with call bell/phone within reach;with chair alarm set;with family/visitor present   Nurse Communication Mobility status;Need for lift equipment        Time: [phone] OT Time Calculation (min): 36 min  Charges: OT General Charges $OT Visit: 1 Visit OT Treatments $Self Care/Home Management : 8-22 mins  Alfonse Flavors, OTA Acute Rehabilitation Services  Office [phone]   Dewain Penning 11/08/2022, 12:24 PM

## 2022-11-09 DIAGNOSIS — I71019 Dissection of thoracic aorta, unspecified: Secondary | ICD-10-CM | POA: Diagnosis not present

## 2022-11-09 LAB — GLUCOSE, CAPILLARY
Glucose-Capillary: 91 mg/dL (ref 70–99)
Glucose-Capillary: 123 mg/dL — ABNORMAL HIGH (ref 70–99)
Glucose-Capillary: 108 mg/dL — ABNORMAL HIGH (ref 70–99)
Glucose-Capillary: 125 mg/dL — ABNORMAL HIGH (ref 70–99)

## 2022-11-09 MED ORDER — SODIUM CHLORIDE 0.9 % IV SOLN
12.5000 mg | Freq: Four times a day (QID) | INTRAVENOUS | Status: DC | PRN
  Administered 2022-11-11 – 2022-11-16 (×4): 12.5 mg via INTRAVENOUS
  Filled 2022-11-09 (×4): qty 12.5
  Filled 2022-11-09: qty 0.5

## 2022-11-09 NOTE — Progress Notes (Addendum)
  Progress Note    11/09/2022 7:40 AM 10 Days Post-Op  Subjective:  didn't sleep well    Vitals:   11/08/22 2311 11/09/22 0334  BP: (!) 145/82 (!) 142/85  Pulse: 95 100  Resp: 19 18  Temp: 99.1 F (37.3 C) 98.3 F (36.8 C)  SpO2: 95% 94%    Physical Exam: General:  resting comforably Cardiac:  regular Lungs:  nonlabored Extremities:  palpable DP pulses bilaterally. L foot in prafo boot. Unchanged LLE weakness  CBC    Component Value Date/Time   WBC 14.7 (H) 11/06/2022 0207   RBC 5.04 11/06/2022 0207   HGB 13.1 11/06/2022 0207   HGB 18.4 (H) 10/23/2022 1417   HCT 39.7 11/06/2022 0207   PLT 309 11/06/2022 0207   PLT 238 10/23/2022 1417   MCV 78.8 (L) 11/06/2022 0207   MCH 26.0 11/06/2022 0207   MCHC 33.0 11/06/2022 0207   RDW 18.4 (H) 11/06/2022 0207   LYMPHSABS 3.0 10/23/2022 1417   MONOABS 0.7 10/23/2022 1417   EOSABS 0.1 10/23/2022 1417   BASOSABS 0.2 (H) 10/23/2022 1417    BMET    Component Value Date/Time   NA 132 (L) 11/08/2022 0124   K 3.6 11/08/2022 0124   CL 102 11/08/2022 0124   CO2 21 (L) 11/08/2022 0124   GLUCOSE 119 (H) 11/08/2022 0124   BUN 37 (H) 11/08/2022 0124   CREATININE 1.10 11/08/2022 0124   CREATININE 1.25 (H) 10/23/2022 1417   CALCIUM 8.2 (L) 11/08/2022 0124   GFRNONAA >60 11/08/2022 0124   GFRNONAA >60 10/23/2022 1417   GFRAA >60 06/12/2018 0815    INR    Component Value Date/Time   INR 1.3 (H) 11/01/2022 1551     Intake/Output Summary (Last 24 hours) at 11/09/2022 0740 Last data filed at 11/09/2022 0335 Gross per 24 hour  Intake 1017 ml  Output 1850 ml  Net -833 ml      Assessment/Plan:  72 y.o. male is 10 days post op, s/p: emergent repair of ruptured left thoracic aortic aneurysm with endovascular stent graft and laser fenestration and subsequent stenting to the left subclavian arter   -No significant changes on exam, still with LLE weakness. L foot protected in prafo boot -He has palpable DP pulses  bilaterally -No changes from a vascular standpoint. He is stable for discharge hopefully to CIR   Loel Dubonnet, PA-C [facility] [phone] 11/09/2022 7:40 AM   I have independently interviewed and examined patient and agree with PA assessment and plan above.   Casey Fye C. Randie Heinz, MD [facility] [place] Office: [phone] Pager: [phone]

## 2022-11-09 NOTE — Progress Notes (Signed)
Inpatient Rehab Admissions Coordinator:    I spoke with Pt.'s wife Melba regarding potential CIR admit. She is interested and can provide 24/7 min A at d/c. I will follow and pursue for admit.   Megan Salon, MS, CCC-SLP Rehab Admissions Coordinator  [phone] (celll) [phone] (office)

## 2022-11-09 NOTE — Progress Notes (Signed)
PROGRESS NOTE    Mathew [name]  ZOX:[id number] DOB: [birth year] DOA: 10/30/2022 PCP: Patient, No Pcp Per   Brief Narrative:  72 year old male with PMHx significant for HCC on chemotherapy, paroxysmal atrial fibrillation, hypertension, and hepatitis C status post treatment.  He initially presented to [facility] Emergency department on 6/21 with complaints of sudden onset chest pain and was diagnosed with thrombosed type B thoracic aortic dissection extending from the distal aortic arch to the level of the diaphragm -initially discharged on the 25th due to no expansion, ultimately returning on the 26th for worsening pain where imaging confirmed aortic rupture requiring endovascular repair by vascular surgery.  Hospital Course: 6/25 Discharged with stable type B Ao dissection with intramural thrombosis  6/26 Returned for admittance  with ascending Ao rupture. Emergent OR for repair 6/27 Patient developed left-sided weakness, MRI brain noted for multiple acute infarcts with embolic pattern, MRI C-spine with cord ischemia on the right side.   6/28 Lumbar drain placed 6/30 Started on vasopressor support to maintain SBP greater than 140 7/1 Vascular continue plans to continue lumbar drain per vascular 7/2 Nausea with emesis, . Increased O2 requirement with faint bibasilar crackles. CXR + Lasix x 1. Lumbar drain removed. 7/4 Coretrak clogged -ultimately removed as patient is tolerating p.o. 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  6/26 > L subclavian was covered during surgery 6/28 CT angiogram showed stable repaired segment of aorta -Vascular surgery following, appreciate insight recommendations -Blood pressure remains moderately well-controlled, has been off pressors since 11/04/2022 -spinal drain removed 7/2 without incident   Acute bilateral multifocal embolic/ischemic strokes MRI Brain showed bilateral multifocal embolic ischemic stroke involving right cerebellar splenium of the corpus callosum on the left, right occipital lobe, right parietal lobe, and left lentiform nucleus -Continues to improve with ongoing physical therapy  Cervical spinal cord edema, concern for infarct MRI C-spine showing spinal cord signal in the C6-C7, edema versus stroke.  Most likely etiology for L>R leg weakness, now improving  Acute transient metabolic encephalopathy  Rule out hospital delirium  -Appreciate stroke team's management. Secondary stroke prevention, focus on rehab. -aspirin, statin ongoing per core measures -DVT prophylaxis resumed with heparin -No indication for further imaging   Acute kidney injury likely ATN in the setting of hemodynamic insults, resolved Hyponatremia NAG acidosis 2/2 renal failure -Increase p.o. intake as appropriate -Aspirin, statin   Questionable COPD(no formal diagnosis) Prolonged history of tobacco abuse OSA -Discussed smoking cessation -Off oxygen as of 7/4 -Out of bed, continue PT OT pending any restrictions given prior spinal drain.   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  -On atezolizumab plus bevacizumab every 3 weeks prior to admission.  - Will need outpatient follow-up with oncology. Atezolizumab  has been associated with aortic dissection previously per FDA drug information sheet (combined in the 6% fatal adverse outcomes associated with  the drug). Retrospective trials have also shown associations with dissections associated with bevacizumab, but the FDA has not included this in the drug information sheet to date.  -Previous team discussed with wife that oncologist should be aware of dissection as this may change course of therapy    Goals of care Patient has multiple comorbidities including metastatic cancer and new multiple infarcts in the setting of type B dissection, ongoing goals of care discussion moving forward however currently patient and family are focused on short-term goals of ambulation and independence. -long-term goals in regards to cancer are ongoing pending resolution of above; family understands that his cancer will continue to progress while he is unable to get treatment for it while he is recovery.   DVT prophylaxis: heparin injection 5,000 Units Start: 11/06/22 1400 SCDs Start: 10/30/22 1959 Code Status:   Code Status: Full Code Family Communication: None present  Status is: Inpatient  Dispo: The patient is from: Home              Anticipated d/c is to: Inpatient rehab              Anticipated d/c date is: 24 to 48 hours              Patient currently is medically stable for discharge  Consultants:  Vascular surgery, PCCM  Procedures:  Endovascular repair of ruptured thoracic aortic aneurysm with coverage of left subclavian artery   Antimicrobials:  Perioperatively  Subjective: No acute issues or events overnight, pain currently well-controlled, strength somewhat improving but not drastically  Objective: Vitals:   11/08/22 1540 11/08/22 1939 11/08/22 2311 11/09/22 0334  BP: 134/77 (!) 153/83 (!) 145/82 (!) 142/85  Pulse: (!) 105 (!) 103 95 100  Resp: 15 16 19 18   Temp: 97.7 F (36.5 C) 98.5 F (36.9 C) 99.1 F (37.3 C) 98.3 F (36.8 C)  TempSrc:  Oral Oral Oral Oral  SpO2: 96% 96% 95% 94%  Weight:    89.2 kg  Height:        Intake/Output Summary (Last 24 hours) at 11/09/2022 0750 Last data filed at 11/09/2022 0335 Gross per 24 hour  Intake 1017 ml  Output 1850 ml  Net -833 ml    Filed Weights   11/07/22 0659 11/08/22 0423 11/09/22 0334  Weight: 90.3 kg 92.2 kg 89.2 kg    Examination:  General:  Pleasantly resting in bed, No acute distress. HEENT:  Normocephalic atraumatic.  Sclerae nonicteric, noninjected.  Extraocular movements intact bilaterally. Neck:  Without mass or deformity.  Trachea is midline. Lungs:  Clear to auscultate bilaterally without rhonchi, wheeze, or rales. Heart:  Regular rate and rhythm.  Without murmurs, rubs, or gallops. Abdomen:  Soft, nontender, nondistended.  Without guarding or rebound. Extremities: Strength diminished globally, right lower extremity 4/5, left lower extremity 35, bilateral upper extremities 4/5 Skin:  Warm and dry, no erythema.  Data Reviewed: I have personally reviewed following labs and imaging studies  CBC: Recent Labs  Lab 11/03/22 0421 11/04/22 0413 11/05/22 0359 11/06/22 0207  WBC 15.8* 15.0* 14.5* 14.7*  HGB 13.4 12.4* 12.6* 13.1  HCT 40.8 37.6* 38.0* 39.7  MCV 80.0 79.7* 81.0 78.8*  PLT 227 234 260 309    Basic Metabolic Panel: Recent Labs  Lab 11/04/22 0413 11/05/22 0359 11/06/22 0207 11/07/22 0046 11/08/22 0124  NA 130* 130* 132* 131* 132*  K 4.0 4.3 4.3 4.6 3.6  CL 100 99 100 100 102  CO2 20* 20* 24 20* 21*  GLUCOSE 104* 83 89 107* 119*  BUN 34* 31* 33* 41* 37*  CREATININE 1.37* 1.19 1.14 1.24 1.10  CALCIUM 8.0* 8.1* 8.4* 8.4* 8.2*  MG 2.1 1.9 2.5* 2.5* 2.3  PHOS  --  2.8 3.5 3.1 3.0    GFR: Estimated Creatinine Clearance: 74.7 mL/min (by C-G formula based on SCr of 1.1 mg/dL).  Recent Labs  Lab 11/05/22 1724  AMMONIA 25    Coagulation Profile: No results for input(s): "INR", "PROTIME" in the last 168 hours.  CBG: Recent Labs   Lab 11/08/22 1137 11/08/22 1538 11/08/22 2107 11/08/22 2130 11/09/22 0629  GLUCAP 125* 116* 181* 104* 91    Recent Results (from the past 240 hour(s))  Surgical pcr screen     Status: None   Collection Time: 10/30/22  4:26 PM   Specimen: Nasal Mucosa; Nasal Swab  Result Value Ref Range Status   MRSA, PCR NEGATIVE NEGATIVE Final   Staphylococcus aureus NEGATIVE NEGATIVE Final    Comment: (NOTE) The Xpert SA Assay (FDA approved for NASAL specimens in patients 59 years of age and older), is one component of a comprehensive surveillance program. It is not intended to diagnose infection nor to guide or monitor treatment. Performed at [facility], [address], [place], [place] [address]          Radiology Studies: No results found.      Scheduled Meds:  (feeding supplement) PROSource Plus  30 mL Oral BID BM   aspirin  81 mg Oral Daily   atorvastatin  80 mg Oral QHS   bacitracin   Topical BID   Chlorhexidine Gluconate Cloth  6 each Topical Daily   docusate sodium  100 mg Oral Daily   feeding supplement  1 Container Oral TID BM   heparin injection (subcutaneous)  5,000 Units Subcutaneous Q8H   insulin aspart  0-9 Units Subcutaneous TID AC & HS   melatonin  3 mg Oral QHS   pantoprazole  40 mg Oral Daily   polyethylene glycol  17 g Oral Daily   Continuous Infusions:  sodium chloride     sodium chloride Stopped (11/06/22 1304)     LOS: 10 days   Time spent:  Azucena Fallen, DO [facility]  If 7PM-7AM, please contact night-coverage [email or web address]  11/09/2022, 7:50 AM

## 2022-11-09 NOTE — Progress Notes (Signed)
Physical Therapy Treatment Patient Details Name: Mathew [name] MRN: [id number] DOB: 1950-08-07 Today's Date: 11/09/2022   History of Present Illness Pt is a 72 y/o M presenting to ED on 6/21 with chest pain and EKG with concern for ST elevation d/c'd 6/25, returned 6/26 and was urgently taken to OR. 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  Post op day 1 pt with L sided weakness, concern for spinal cord ischemia vs CVA. 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    PT Comments  Pt received sitting in the recliner and agreeable to session. Pt reporting increased fatigue, weakness, and nausea today limiting activity tolerance. Pt able to stand from recliner with mod A +2 due to lower surface, but was able to stand from stedy paddles x2 with light min A for upright posture. Pt demonstrating reduced trunk flexion in standing this session and is able to march with stedy support. Pt requesting to return to bed due to nausea and back pain. MD present at end of session. Pt continues to benefit from PT services to progress toward functional mobility goals.      Assistance Recommended at Discharge Frequent or constant Supervision/Assistance  If plan is discharge home, recommend the following:  Can travel by private vehicle    A lot of help with walking and/or transfers;A lot of help with bathing/dressing/bathroom;Assist for transportation;Help with stairs or ramp for entrance      Equipment Recommendations   (TBD at next venue)    Recommendations for Other Services       Precautions / Restrictions Precautions Precautions: Fall Restrictions Weight Bearing Restrictions: No      Mobility  Bed Mobility Overal bed mobility: Needs Assistance Bed Mobility: Sit to Supine       Sit to supine: Mod assist   General bed mobility comments: Mod A to elevate LLE to EOB and reposition shoulders    Transfers Overall transfer level: Needs assistance Equipment used: Ambulation equipment used (stedy) Transfers: Sit to/from Stand, Bed to chair/wheelchair/BSC Sit to Stand: Mod assist, +2 physical assistance, Min assist           General transfer comment: Mod A +2 from recliner for power up due to lower surface. Pt with good power up from stedy paddles x2 requiring min A to bring hips under and achieve a full upright posture Transfer via Lift Equipment: Stedy  Ambulation/Gait             Pre-gait activities: marching in stedy         Balance Overall balance assessment: Needs assistance Sitting-balance support: Feet supported, Single extremity supported Sitting balance-Leahy Scale: Fair Sitting balance - Comments: sitting in recliner   Standing balance support: During functional activity, Reliant on assistive device for balance, Bilateral upper extremity supported Standing balance-Leahy Scale: Poor Standing balance comment: with stedy support                            Cognition Arousal/Alertness: Awake/alert Behavior During Therapy: Flat affect Overall Cognitive Status: Within Functional Limits for tasks assessed  Exercises      General Comments        Pertinent Vitals/Pain Pain Assessment Pain Assessment: Faces Faces Pain Scale: Hurts even more Pain Location: back/neck and LLE Pain Descriptors / Indicators: Discomfort, Sore Pain Intervention(s): Monitored during session, Repositioned     PT Goals (current goals can now be found in the care plan section) Acute Rehab PT Goals Patient Stated Goal: get better PT Goal Formulation: With patient/family Time For Goal  Achievement: 11/19/22 Potential to Achieve Goals: Good Progress towards PT goals: Progressing toward goals    Frequency    Min 1X/week      PT Plan Current plan remains appropriate       AM-PAC PT "6 Clicks" Mobility   Outcome Measure  Help needed turning from your back to your side while in a flat bed without using bedrails?: A Little Help needed moving from lying on your back to sitting on the side of a flat bed without using bedrails?: A Little Help needed moving to and from a bed to a chair (including a wheelchair)?: Total Help needed standing up from a chair using your arms (e.g., wheelchair or bedside chair)?: Total Help needed to walk in hospital room?: Total Help needed climbing 3-5 steps with a railing? : Total 6 Click Score: 10    End of Session Equipment Utilized During Treatment: Gait belt Activity Tolerance: Patient limited by fatigue;Other (comment) (nausea) Patient left: with call bell/phone within reach;with nursing/sitter in room;in bed Nurse Communication: Mobility status PT Visit Diagnosis: Unsteadiness on feet (R26.81);Muscle weakness (generalized) (M62.81);Difficulty in walking, not elsewhere classified (R26.2)     Time: 1107-1130 PT Time Calculation (min) (ACUTE ONLY): 23 min  Charges:    $Therapeutic Activity: 23-37 mins PT General Charges $$ ACUTE PT VISIT: 1 Visit                     Johny Shock, PTA Acute Rehabilitation Services Secure Chat Preferred  [phone]    Johny Shock 11/09/2022, 12:02 PM

## 2022-11-10 DIAGNOSIS — I71019 Dissection of thoracic aorta, unspecified: Secondary | ICD-10-CM | POA: Diagnosis not present

## 2022-11-10 LAB — GLUCOSE, CAPILLARY
Glucose-Capillary: 122 mg/dL — ABNORMAL HIGH (ref 70–99)
Glucose-Capillary: 118 mg/dL — ABNORMAL HIGH (ref 70–99)
Glucose-Capillary: 110 mg/dL — ABNORMAL HIGH (ref 70–99)
Glucose-Capillary: 124 mg/dL — ABNORMAL HIGH (ref 70–99)

## 2022-11-10 NOTE — Progress Notes (Addendum)
  Progress Note    11/10/2022 7:41 AM 11 Days Post-Op  Subjective:  sleeping comfortably    Vitals:   11/10/22 0036 11/10/22 0425  BP: (!) 143/76 137/83  Pulse: 96 95  Resp: (!) 21 (!) 21  Temp: 99.5 F (37.5 C) 98.9 F (37.2 C)  SpO2: 95% 96%    Physical Exam: Cardiac:  regular Lungs:  nonlabored Extremities:  palpable DP pulses bilaterally CBC    Component Value Date/Time   WBC 14.7 (H) 11/06/2022 0207   RBC 5.04 11/06/2022 0207   HGB 13.1 11/06/2022 0207   HGB 18.4 (H) 10/23/2022 1417   HCT 39.7 11/06/2022 0207   PLT 309 11/06/2022 0207   PLT 238 10/23/2022 1417   MCV 78.8 (L) 11/06/2022 0207   MCH 26.0 11/06/2022 0207   MCHC 33.0 11/06/2022 0207   RDW 18.4 (H) 11/06/2022 0207   LYMPHSABS 3.0 10/23/2022 1417   MONOABS 0.7 10/23/2022 1417   EOSABS 0.1 10/23/2022 1417   BASOSABS 0.2 (H) 10/23/2022 1417    BMET    Component Value Date/Time   NA 132 (L) 11/08/2022 0124   K 3.6 11/08/2022 0124   CL 102 11/08/2022 0124   CO2 21 (L) 11/08/2022 0124   GLUCOSE 119 (H) 11/08/2022 0124   BUN 37 (H) 11/08/2022 0124   CREATININE 1.10 11/08/2022 0124   CREATININE 1.25 (H) 10/23/2022 1417   CALCIUM 8.2 (L) 11/08/2022 0124   GFRNONAA >60 11/08/2022 0124   GFRNONAA >60 10/23/2022 1417   GFRAA >60 06/12/2018 0815    INR    Component Value Date/Time   INR 1.3 (H) 11/01/2022 1551     Intake/Output Summary (Last 24 hours) at 11/10/2022 0741 Last data filed at 11/10/2022 0427 Gross per 24 hour  Intake 120 ml  Output 1100 ml  Net -980 ml      Assessment/Plan:  72 y.o. male is 11 days post op, s/p: emergent repair of ruptured left thoracic aortic aneurysm with endovascular stent graft and laser fenestration and subsequent stenting to the left subclavian artery   -Patient was resting comfortably this morning so I tried not to disturb him. His left foot is still protected in the prafo boot -Bilateral DP pulses palpable -Pending CIR admission   Loel Dubonnet, PA-C [facility] [phone] 11/10/2022 7:41 AM   I have independently interviewed and examined patient and agree with PA assessment and plan above.   Baylen Buckner C. Randie Heinz, MD [facility] [place] Office: [phone] Pager: [phone]

## 2022-11-10 NOTE — Progress Notes (Signed)
PROGRESS NOTE    Mathew [name]  XWR:[id number] DOB: January 05, 1951 DOA: 10/30/2022 PCP: Patient, No Pcp Per   Brief Narrative:  72 year old male with PMHx significant for HCC on chemotherapy, paroxysmal atrial fibrillation, hypertension, and hepatitis C status post treatment.  He initially presented to [facility] Emergency department on 6/21 with complaints of sudden onset chest pain and was diagnosed with thrombosed type B thoracic aortic dissection extending from the distal aortic arch to the level of the diaphragm -initially discharged on the 25th due to no expansion, ultimately returning on the 26th for worsening pain where imaging confirmed aortic rupture requiring endovascular repair by vascular surgery.  Hospital Course: 6/25 Discharged with stable type B Ao dissection with intramural thrombosis  6/26 Returned for admittance  with ascending Ao rupture. Emergent OR for repair 6/27 Patient developed left-sided weakness, MRI brain noted for multiple acute infarcts with embolic pattern, MRI C-spine with cord ischemia on the right side.   6/28 Lumbar drain placed 6/30 Started on vasopressor support to maintain SBP greater than 140 7/1 Vascular continue plans to continue lumbar drain per vascular 7/2 Nausea with emesis, . Increased O2 requirement with faint bibasilar crackles. CXR + Lasix x 1. Lumbar drain removed. 7/4 Coretrak clogged -ultimately removed as patient is tolerating p.o. 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  6/26 > L subclavian was covered during surgery 6/28 CT angiogram showed stable repaired segment of aorta -Vascular surgery following, appreciate insight recommendations -Blood pressure remains moderately well-controlled, has been off pressors since 11/04/2022 -spinal drain removed 7/2 without incident   Acute bilateral multifocal embolic/ischemic strokes MRI Brain showed bilateral multifocal embolic ischemic stroke involving right cerebellar splenium of the corpus callosum on the left, right occipital lobe, right parietal lobe, and left lentiform nucleus -Continues to improve with ongoing physical therapy  Cervical spinal cord edema, concern for infarct MRI C-spine showing spinal cord signal in the C6-C7, edema versus stroke.  Most likely etiology for L>R leg weakness, now improving  Acute transient metabolic encephalopathy  Rule out hospital delirium  -Appreciate stroke team's management. Secondary stroke prevention, focus on rehab. -aspirin, statin ongoing per core measures -DVT prophylaxis resumed with heparin -No indication for further imaging   Acute kidney injury likely ATN in the setting of hemodynamic insults, resolved Hyponatremia NAG acidosis 2/2 renal failure -Increase p.o. intake as appropriate -Aspirin, statin   Questionable COPD(no formal diagnosis) Prolonged history of tobacco abuse OSA -Discussed smoking cessation -Off oxygen as of 7/4 -Out of bed, continue PT OT pending any restrictions given prior spinal drain.   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  -On atezolizumab plus bevacizumab every 3 weeks prior to admission.  - Will need outpatient follow-up with oncology. Atezolizumab  has been associated with aortic dissection previously per FDA drug information sheet (combined in the 6% fatal adverse outcomes associated with  the drug). Retrospective trials have also shown associations with dissections associated with bevacizumab, but the FDA has not included this in the drug information sheet to date.  -Previous team discussed with wife that oncologist should be aware of dissection as this may change course of therapy    Goals of care Patient has multiple comorbidities including metastatic cancer and new multiple infarcts in the setting of type B dissection, ongoing goals of care discussion moving forward however currently patient and family are focused on short-term goals of ambulation and independence. -long-term goals in regards to cancer are ongoing pending resolution of above; family understands that his cancer will continue to progress while he is unable to get treatment for it while he is recovery.   DVT prophylaxis: heparin injection 5,000 Units Start: 11/06/22 1400 SCDs Start: 10/30/22 1959 Code Status:   Code Status: Full Code Family Communication: None present  Status is: Inpatient  Dispo: The patient is from: Home              Anticipated d/c is to: Inpatient rehab              Anticipated d/c date is: 24 to 48 hours              Patient currently is medically stable for discharge  Consultants:  Vascular surgery, PCCM  Procedures:  Endovascular repair of ruptured thoracic aortic aneurysm with coverage of left subclavian artery   Antimicrobials:  Perioperatively  Subjective: No acute issues or events overnight, pain currently well-controlled, strength somewhat improving but not drastically  Objective: Vitals:   11/09/22 1622 11/09/22 2000 11/10/22 0036 11/10/22 0425  BP: (!) 141/76 (!) 149/83 (!) 143/76 137/83  Pulse: 96 98 96 95  Resp: 20 (!) 21 (!) 21 (!) 21  Temp: 98.7 F (37.1 C) 99.6 F (37.6 C) 99.5 F (37.5 C) 98.9 F (37.2 C)  TempSrc:  Oral Oral Oral Oral  SpO2: 96% 97% 95% 96%  Weight:    91 kg  Height:        Intake/Output Summary (Last 24 hours) at 11/10/2022 0734 Last data filed at 11/10/2022 0427 Gross per 24 hour  Intake 120 ml  Output 1100 ml  Net -980 ml    Filed Weights   11/08/22 0423 11/09/22 0334 11/10/22 0425  Weight: 92.2 kg 89.2 kg 91 kg    Examination:  General:  Pleasantly resting in bed, No acute distress. HEENT:  Normocephalic atraumatic.  Sclerae nonicteric, noninjected.  Extraocular movements intact bilaterally. Neck:  Without mass or deformity.  Trachea is midline. Lungs:  Clear to auscultate bilaterally without rhonchi, wheeze, or rales. Heart:  Regular rate and rhythm.  Without murmurs, rubs, or gallops. Abdomen:  Soft, nontender, nondistended.  Without guarding or rebound. Extremities: Strength diminished globally, right lower extremity 4/5, left lower extremity 35, bilateral upper extremities 4/5 Skin:  Warm and dry, no erythema.  Data Reviewed: I have personally reviewed following labs and imaging studies  CBC: Recent Labs  Lab 11/04/22 0413 11/05/22 0359 11/06/22 0207  WBC 15.0* 14.5* 14.7*  HGB 12.4* 12.6* 13.1  HCT 37.6* 38.0* 39.7  MCV 79.7* 81.0 78.8*  PLT 234 260 309    Basic Metabolic Panel: Recent Labs  Lab 11/04/22 0413 11/05/22 0359 11/06/22 0207 11/07/22 0046 11/08/22 0124  NA 130* 130* 132* 131* 132*  K 4.0 4.3 4.3 4.6 3.6  CL 100 99 100 100 102  CO2 20* 20* 24 20* 21*  GLUCOSE 104* 83 89 107* 119*  BUN 34* 31* 33* 41* 37*  CREATININE 1.37* 1.19 1.14 1.24 1.10  CALCIUM 8.0* 8.1* 8.4* 8.4* 8.2*  MG 2.1 1.9 2.5* 2.5* 2.3  PHOS  --  2.8 3.5 3.1 3.0    GFR: Estimated Creatinine Clearance: 74.7 mL/min (by C-G formula based on SCr of 1.1 mg/dL).  Recent Labs  Lab 11/05/22 1724  AMMONIA 25    Coagulation Profile: No results for input(s): "INR", "PROTIME" in the last 168 hours.  CBG: Recent Labs  Lab 11/09/22 0629 11/09/22 1147  11/09/22 1621 11/09/22 2105 11/10/22 0608  GLUCAP 91 123* 108* 125* 122*    No results found for this or any previous visit (from the past 240 hour(s)).        Radiology Studies: No results found.      Scheduled Meds:  (feeding supplement) PROSource Plus  30 mL Oral BID BM   aspirin  81 mg Oral Daily   atorvastatin  80 mg Oral QHS   bacitracin   Topical BID   Chlorhexidine Gluconate Cloth  6 each Topical Daily   docusate sodium  100 mg Oral Daily   feeding supplement  1 Container Oral TID BM   heparin injection (subcutaneous)  5,000 Units Subcutaneous Q8H   insulin aspart  0-9 Units Subcutaneous TID AC & HS   melatonin  3 mg Oral QHS   pantoprazole  40 mg Oral Daily   polyethylene glycol  17 g Oral Daily   Continuous Infusions:  sodium chloride     sodium chloride Stopped (11/06/22 1304)   promethazine (PHENERGAN) injection (IM or IVPB)       LOS: 11 days   Time spent:  Azucena Fallen, DO [facility]  If 7PM-7AM, please contact night-coverage [email or web address]  11/10/2022, 7:34 AM

## 2022-11-11 ENCOUNTER — Telehealth: Payer: Self-pay

## 2022-11-11 DIAGNOSIS — I71019 Dissection of thoracic aorta, unspecified: Secondary | ICD-10-CM | POA: Diagnosis not present

## 2022-11-11 LAB — GLUCOSE, CAPILLARY
Glucose-Capillary: 111 mg/dL — ABNORMAL HIGH (ref 70–99)
Glucose-Capillary: 110 mg/dL — ABNORMAL HIGH (ref 70–99)
Glucose-Capillary: 121 mg/dL — ABNORMAL HIGH (ref 70–99)
Glucose-Capillary: 106 mg/dL — ABNORMAL HIGH (ref 70–99)

## 2022-11-11 MED ORDER — LOPERAMIDE HCL 2 MG PO CAPS
2.0000 mg | ORAL_CAPSULE | ORAL | Status: DC | PRN
  Administered 2022-11-11 (×2): 2 mg via ORAL
  Filled 2022-11-11 (×2): qty 1

## 2022-11-11 NOTE — Progress Notes (Signed)
Inpatient Rehab Admissions Coordinator:    I met with pt. To discuss potential CIR admit. He is interested. Will send case to insurance once OT has documented today.   Megan Salon, MS, CCC-SLP Rehab Admissions Coordinator  [phone] (celll) [phone] (office)

## 2022-11-11 NOTE — Progress Notes (Signed)
Nutrition Follow-up  DOCUMENTATION CODES:   Non-severe (moderate) malnutrition in context of chronic illness  INTERVENTION:  Dc Prosource ONS   Continue Boost Breeze po TID, each supplement provides 250 kcal and 9 grams of protein  Encourage po intake    NUTRITION DIAGNOSIS:   Moderate Malnutrition related to chronic illness (metastatic hepatic carcinoma) as evidenced by moderate muscle depletion, moderate fat depletion. -ongoing   GOAL:   Patient will meet greater than or equal to 90% of their needs -progressing with po intake and ONS   MONITOR:   PO intake, Supplement acceptance, TF tolerance, Weight trends, Labs  REASON FOR ASSESSMENT:   Consult Enteral/tube feeding initiation and management  ASSESSMENT:   72 yo male admitted with Type B aortic dissection with rupture in thoracic aorta requiring TEVAR. Pt with known stage IV hepatocellular carcinoma with mets to lung; atient is on LUNG Atezolizumab + Bevacizumab Maintenance q21d. PMH includes HTN, OSA, hx prostate cancer (2006)  POD 13 emergent repair of ruptured left thoracic aortic aneurysm with endovascular stent graft and laser fenestration and subsequent stenting to the left subclavian artery   Patient not available at time of visit. GOC conversation taking place. RD reviewed chart. Patient has been reported to be improving but can only make so much improvement due to his metastatic disease.   CIR admission vs. SNF with palliative vs. Home with hospice   Per [facility], patient has been refusing all prosource ONS and has drank 2 out of 3 Boost Breeze in the past 24hrs.   Labs: reviewed  Meds: PS BID, lipitor, colace, boost breeze TID, insulin, protonix, miralax, NS  Wt: admit wt 200#, current BW 199#  PO: 55% avg po x 6 meals  I/O's:  -7.5 L since admission    Diet Order:   Diet Order             DIET DYS 3 Room service appropriate? Yes; Fluid consistency: Thin  Diet effective now                    EDUCATION NEEDS:   Education needs have been addressed  Skin:  Skin Assessment: Reviewed RN Assessment  Last BM:  7/8 type 6  Height:   Ht Readings from Last 1 Encounters:  10/30/22 6\' 3"  (1.905 m)    Weight:   Wt Readings from Last 1 Encounters:  11/12/22 90.4 kg    Ideal Body Weight:     BMI:  Body mass index is 24.91 kg/m.  Estimated Nutritional Needs:   Kcal:  2200-2500 kcals  Protein:  125-150 g  Fluid:  1.8 L    Leodis Rains, RDN, LDN  Clinical Nutrition

## 2022-11-11 NOTE — Progress Notes (Signed)
Disability and FMLA extension forms completed as requested by Patient. Fax transmission confirmation received. Copy of forms mailed to Patient as requested.

## 2022-11-11 NOTE — Progress Notes (Signed)
Pt has paperwork on chart to be filled out by MD for disability insurance. Thanks Thomas Hoff, RN

## 2022-11-11 NOTE — Plan of Care (Signed)
  Problem: Education: Goal: Knowledge of General Education information will improve Description: Including pain rating scale, medication(s)/side effects and non-pharmacologic comfort measures Outcome: Progressing   Problem: Clinical Measurements: Goal: Ability to maintain clinical measurements within normal limits will improve Outcome: Progressing   Problem: Clinical Measurements: Goal: Will remain free from infection Outcome: Progressing   Problem: Clinical Measurements: Goal: Respiratory complications will improve Outcome: Progressing   Problem: Coping: Goal: Level of anxiety will decrease Outcome: Progressing   Problem: Nutrition: Goal: Adequate nutrition will be maintained Outcome: Progressing   Problem: Elimination: Goal: Will not experience complications related to bowel motility Outcome: Progressing   Problem: Pain Managment: Goal: General experience of comfort will improve Outcome: Progressing   Problem: Ischemic Stroke/TIA Tissue Perfusion: Goal: Complications of ischemic stroke/TIA will be minimized Outcome: Progressing

## 2022-11-11 NOTE — Progress Notes (Signed)
Occupational Therapy Treatment Patient Details Name: Mathew [name] MRN: [id number] DOB: April 02, 1951 Today's Date: 11/11/2022   History of present illness Pt is a 72 y/o M presenting to ED on 6/21 with chest pain and EKG with concern for ST elevation d/c'd 6/25, returned 6/26 and was urgently taken to OR. 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  Post op day 1 pt with L sided weakness, concern for spinal cord ischemia vs CVA. 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   OT comments  Pt with slow progression toward goals, limited by bowel incontinence this session, needing mod -max A +2 for transfers, opted for use of Stedy vs RW. Min A for bed mobility and max A for pericare in standing. Pt presenting with impairments listed below, will follow acutely. Patient will benefit from intensive inpatient follow up therapy, >3 hours/day to maximize safety/ind with ADLs/functional mobility.    Recommendations for follow up therapy are one component of a multi-disciplinary discharge planning process, led by the attending physician.  Recommendations may be updated based on patient status, additional functional criteria and insurance authorization.    Assistance Recommended at Discharge Frequent or constant Supervision/Assistance  Patient can return home with the following  Two people to help with walking and/or transfers;A lot of help with bathing/dressing/bathroom;Assistance with cooking/housework;Direct supervision/assist for medications management;Direct supervision/assist for financial management;Assist for transportation;Help with stairs or ramp for entrance   Equipment Recommendations   Other (comment) (defer)    Recommendations for Other Services PT consult;Rehab consult    Precautions / Restrictions Precautions Precautions: Fall Restrictions Weight Bearing Restrictions: No       Mobility Bed Mobility Overal bed mobility: Needs Assistance         Sit to supine: Min assist        Transfers Overall transfer level: Needs assistance Equipment used: Ambulation equipment used, Rolling walker (2 wheels) Transfers: Sit to/from Stand, Bed to chair/wheelchair/BSC Sit to Stand: Mod assist, Max assist     Step pivot transfers: Mod assist       Transfer via Lift Equipment: Stedy   Balance Overall balance assessment: Needs assistance Sitting-balance support: No upper extremity supported, Feet supported Sitting balance-Leahy Scale: Fair     Standing balance support: Bilateral upper extremity supported, Reliant on assistive device for balance Standing balance-Leahy Scale: Poor                             ADL either performed or assessed with clinical judgement   ADL Overall ADL's : Needs assistance/impaired                         Toilet Transfer: Maximal assistance Toilet Transfer Details (indicate cue type and reason): with use of RW, mod A for Stedy         Functional mobility during ADLs: Moderate assistance      Extremity/Trunk Assessment Upper Extremity Assessment Upper Extremity Assessment: Generalized weakness (mild weakness compared to RUE, functional for BADL, able to brush teeth, pt is RHD)   Lower Extremity Assessment Lower Extremity Assessment: Defer to PT evaluation        Vision   Vision Assessment?:  No apparent visual deficits   Perception Perception Perception: Not tested   Praxis Praxis Praxis: Not tested    Cognition Arousal/Alertness: Awake/alert Behavior During Therapy: WFL for tasks assessed/performed Overall Cognitive Status: Within Functional Limits for tasks assessed                                           Exercises      Shoulder Instructions       General Comments VSS on RA    Pertinent Vitals/ Pain       Pain Assessment Pain Assessment: No/denies pain  Home Living                                          Prior Functioning/Environment              Frequency  Min 1X/week        Progress Toward Goals  OT Goals(current goals can now be found in the care plan section)  Progress towards OT goals: Progressing toward goals  Acute Rehab OT Goals Patient Stated Goal: to get better OT Goal Formulation: With patient Time For Goal Achievement: 11/19/22 Potential to Achieve Goals: Good ADL Goals Pt Will Perform Upper Body Dressing: with min assist;sitting Pt Will Perform Lower Body Dressing: with min assist;sitting/lateral leans;sit to/from stand Pt Will Transfer to Toilet: with min assist;ambulating;regular height toilet Additional ADL Goal #1: pt will be mod A for bed mobility in prep for ADLs  Plan Discharge plan remains appropriate    Co-evaluation                 AM-PAC OT "6 Clicks" Daily Activity     Outcome Measure   Help from another person eating meals?: A Little Help from another person taking care of personal grooming?: A Lot Help from another person toileting, which includes using toliet, bedpan, or urinal?: A Lot Help from another person bathing (including washing, rinsing, drying)?: A Lot Help from another person to put on and taking off regular upper body clothing?: A Lot Help from another person to put on and taking off regular lower body clothing?: A Lot 6 Click Score: 13    End of Session Equipment Utilized During Treatment: Gait belt;Other (comment) (stedy)  OT Visit Diagnosis: Unsteadiness on feet (R26.81);Other abnormalities of gait and mobility (R26.89);Muscle weakness (generalized) (M62.81)   Activity Tolerance Patient tolerated treatment well   Patient Left in chair;with  call bell/phone within reach;with chair alarm set   Nurse Communication Mobility status;Need for lift equipment        Time: 1120-1208 OT Time Calculation (min): 48 min  Charges: OT General Charges $OT Visit: 1 Visit OT Treatments $Self Care/Home Management : 38-52 mins  Carver Fila, OTD, OTR/L SecureChat Preferred Acute Rehab [phone]   Carver Fila Koonce 11/11/2022, 12:20 PM

## 2022-11-11 NOTE — Telephone Encounter (Signed)
Pt's wife (Melba) called stating that Dr. Mosetta Putt called her on Friday, 11/08/2022 and stated that if Melba & pt had additional questions to please give her office a call.  Melba called stating that she and pt have some additional questions and would like for Dr. Mosetta Putt to give Melba a call back at [phone].  Notified Dr. Mosetta Putt of pt's wife's call.

## 2022-11-11 NOTE — Progress Notes (Signed)
Physical Therapy Treatment Patient Details Name: Mathew [name] MRN: [id number] DOB: 11-Oct-1950 Today's Date: 11/11/2022   History of Present Illness Pt is a 72 y/o M presenting to ED on 6/21 with chest pain and EKG with concern for ST elevation d/c'd 6/25, returned 6/26 and was urgently taken to OR. 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  Post op day 1 pt with L sided weakness, concern for spinal cord ischemia vs CVA. 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    PT Comments  Pt tolerates treatment well, although session is limited by multiple incidents of liquid bowel movements. PT focuses session on transfer technique, providing cues for foot and hand placement pre-transfer, as well as increased trunk flexion when initiating transfer attempts. Pt demonstrates improvement in transfer quality with verbal cues but benefits from reinforcement. PT continues to recommend high intensity inpatient PT services at the time of discharge as the pt demonstrates good potential to make significant functional gains.    Assistance Recommended at Discharge Frequent or constant Supervision/Assistance  If plan is discharge home, recommend the following:  Can travel by private vehicle    A lot of help with walking and/or transfers;A lot of help with bathing/dressing/bathroom;Assist for transportation;Help with stairs or ramp for entrance      Equipment Recommendations  Wheelchair (measurements PT);Wheelchair cushion (measurements PT);Rolling walker (2 wheels);BSC/3in1    Recommendations for Other Services       Precautions / Restrictions Precautions Precautions: Fall Restrictions Weight  Bearing Restrictions: No     Mobility  Bed Mobility Overal bed mobility: Needs Assistance Bed Mobility: Rolling, Sidelying to Sit Rolling: Min guard Sidelying to sit: Min assist       General bed mobility comments: hand hold when attemtping to pull into sitting    Transfers Overall transfer level: Needs assistance Equipment used: Rolling walker (2 wheels), 1 person hand held assist Transfers: Sit to/from Stand, Bed to chair/wheelchair/BSC Sit to Stand: Mod assist, Min assist, From elevated surface   Step pivot transfers: Mod assist Squat pivot transfers: Mod assist     General transfer comment: modA for initial transfers, PT provides cues for foot placement, trunk flexion, rocking to gain momentum, and hand placement. minA sit to stand with cues for technique from elevated surface.    Ambulation/Gait             Pre-gait activities: pt steps from bedside commode to chair, tends to sink into knee and hip flexion with stepping     Stairs             Wheelchair Mobility     Tilt Bed    Modified Rankin (Stroke Patients Only)       Balance Overall balance assessment: Needs assistance Sitting-balance support: No upper extremity supported, Feet supported Sitting balance-Leahy Scale: Fair     Standing balance support: Bilateral upper extremity supported, Reliant on assistive device for balance Standing balance-Leahy Scale: Poor Standing balance comment: minA with BUE support                            Cognition Arousal/Alertness: Awake/alert Behavior During Therapy: WFL for tasks assessed/performed Overall Cognitive Status: Within Functional Limits for  tasks assessed                                          Exercises      General Comments General comments (skin integrity, edema, etc.): VSS on RA, pt is incontinent of stool once during session, 2nd bowel movement of loose stool later during session. Pt reports  stress/strain has consisntently been producing loose stool over last few days      Pertinent Vitals/Pain Pain Assessment Pain Assessment: No/denies pain    Home Living                          Prior Function            PT Goals (current goals can now be found in the care plan section) Acute Rehab PT Goals Patient Stated Goal: get better Progress towards PT goals: Progressing toward goals    Frequency    Min 1X/week      PT Plan Current plan remains appropriate    Co-evaluation              AM-PAC PT "6 Clicks" Mobility   Outcome Measure  Help needed turning from your back to your side while in a flat bed without using bedrails?: A Little Help needed moving from lying on your back to sitting on the side of a flat bed without using bedrails?: A Little Help needed moving to and from a bed to a chair (including a wheelchair)?: A Lot Help needed standing up from a chair using your arms (e.g., wheelchair or bedside chair)?: A Lot Help needed to walk in hospital room?: Total Help needed climbing 3-5 steps with a railing? : Total 6 Click Score: 12    End of Session   Activity Tolerance: Patient tolerated treatment well Patient left: in chair;with call bell/phone within reach;with chair alarm set Nurse Communication: Mobility status PT Visit Diagnosis: Unsteadiness on feet (R26.81);Muscle weakness (generalized) (M62.81);Difficulty in walking, not elsewhere classified (R26.2)     Time: 4098-1191 PT Time Calculation (min) (ACUTE ONLY): 55 min  Charges:    $Therapeutic Activity: 38-52 mins PT General Charges $$ ACUTE PT VISIT: 1 Visit                     Arlyss Gandy, PT, DPT Acute Rehabilitation Office [phone]    Arlyss Gandy 11/11/2022, 10:32 AM

## 2022-11-11 NOTE — Progress Notes (Addendum)
PROGRESS NOTE    Mathew [name]  BJY:[id number] DOB: Jan 02, 1951 DOA: 10/30/2022 PCP: Patient, No Pcp Per   Brief Narrative:  72 year old male with PMHx significant for HCC on chemotherapy, paroxysmal atrial fibrillation, hypertension, and hepatitis C status post treatment.  He initially presented to [facility] Emergency department on 6/21 with complaints of sudden onset chest pain and was diagnosed with thrombosed type B thoracic aortic dissection extending from the distal aortic arch to the level of the diaphragm -initially discharged on the 25th due to no expansion, ultimately returning on the 26th for worsening pain where imaging confirmed aortic rupture requiring endovascular repair by vascular surgery.  Hospital Course: 6/25 Discharged with stable type B Ao dissection with intramural thrombosis  6/26 Returned for admittance  with ascending Ao rupture. Emergent OR for repair 6/27 Patient developed left-sided weakness, MRI brain noted for multiple acute infarcts with embolic pattern, MRI C-spine with cord ischemia on the right side.   6/28 Lumbar drain placed 6/30 Started on vasopressor support to maintain SBP greater than 140 7/1 Vascular continue plans to continue lumbar drain per vascular 7/2 Nausea with emesis, . Increased O2 requirement with faint bibasilar crackles. CXR + Lasix x 1. Lumbar drain removed. 7/4 Coretrak clogged -ultimately removed as patient is tolerating p.o. 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 Will consult Palliative care and have them follow along with patient and wife. Hoping patient continues to improve clinically/physically but unfortunately given his advanced metastatic disease there is little that can be done to improve or prolong his quality of life which is something the Pt and I have discussed at length. -Family hoping for inpatient rehab but if declined would need to discuss palliative follow up at SNF vs possibly home with hospice pending further discussions.  Ruptured type B aortic dissection s/p emergent TEVAR, complicated by spinal cord ischemia and embolic strokes, but he is showing signs of recovery of motor function suggesting against complete spinal infarction.  6/26 > L subclavian was covered during surgery 6/28 CT angiogram showed stable repaired segment of aorta -Vascular surgery following, appreciate insight recommendations -Blood pressure well-controlled, off pressors since 7/1 -spinal drain removed 7/2 without incident   Acute bilateral multifocal embolic/ischemic strokes -MRI Brain showed bilateral multifocal embolic ischemic stroke -Continues to improve with ongoing physical therapy -Continue aspirin, statin  Cervical spinal cord edema, concern for infarct Ambulatory dysfunction -MRI C-spine showing spinal cord signal in the C6-C7, edema versus stroke.  -Most likely etiology for L>R leg weakness, now improving but not yet back to baseline  Acute transient metabolic encephalopathy, resolved Rule out hospital delirium, resolved -Appreciate stroke team's management. Secondary stroke prevention, focus on rehab. -Mental status slowly returned to baseline with supportive care -No indication for further imaging   Acute kidney injury likely ATN in the setting of hemodynamic insults, resolved Hyponatremia NAG acidosis 2/2 renal failure -Continue to advance diet, appetite continues to be poor -liberalize diet in hopes to improve caloric  intake  Questionable COPD(no formal diagnosis) Prolonged history of tobacco abuse OSA -Discussed smoking cessation -  Off oxygen as of 7/4 -Out of bed, continue PT/OT pending any restrictions given prior spinal drain has been removed.   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  -On atezolizumab plus bevacizumab every 3 weeks prior to admission.  - Will need outpatient follow-up with oncology. Atezolizumab has been associated with aortic dissection previously per FDA drug information sheet (combined in the 6% fatal adverse outcomes associated with the drug). Retrospective trials have also shown associations with dissections associated with bevacizumab, but the FDA has not included this in the drug information sheet to date.  -Previous team discussed with wife that oncologist should be aware of dissection as this may change course of therapy    DVT prophylaxis: heparin injection 5,000 Units Start: 11/06/22 1400 SCDs Start: 10/30/22 1959 Code Status:   Code Status: Full Code Family Communication: None present  Status is: Inpatient  Dispo: The patient is from: Home              Anticipated d/c is to: Inpatient rehab              Anticipated d/c date is: 24 to 48 hours              Patient currently is medically stable for discharge  Consultants:  Vascular surgery, PCCM  Procedures:  Endovascular repair of ruptured thoracic aortic aneurysm with coverage of left subclavian artery   Antimicrobials:  Perioperatively  Subjective: No acute issues or events overnight, pain currently well-controlled, ambulatory status improving but patient remains somewhat frustrated with weakness and delay in oncology care due to recent illness.  Objective: Vitals:   11/10/22 0752 11/10/22 1626 11/10/22 2305 11/11/22 0303  BP: 133/76 (!) 150/81 135/76  136/84  Pulse: 88 94 (!) 104 98  Resp: 17 20 20 20   Temp: 98.3 F (36.8 C) 99.3 F (37.4 C) 98.8 F (37.1 C) 98.5 F (36.9 C)  TempSrc: Oral Oral Oral Oral  SpO2: 98% 96% 93% 96%  Weight:      Height:        Intake/Output Summary (Last 24 hours) at 11/11/2022 0708 Last data filed at 11/10/2022 1629 Gross per 24 hour  Intake 360 ml  Output 1350 ml  Net -990 ml    Filed Weights   11/08/22 0423 11/09/22 0334 11/10/22 0425  Weight: 92.2 kg 89.2 kg 91 kg    Examination:  General:  Pleasantly resting in bed, No acute distress. HEENT:  Normocephalic atraumatic.  Sclerae nonicteric, noninjected.  Extraocular movements intact bilaterally. Neck:  Without mass or deformity.  Trachea is midline. Lungs:  Clear to auscultate bilaterally without rhonchi, wheeze, or rales. Heart:  Regular rate and rhythm.  Without murmurs, rubs, or gallops. Abdomen:  Soft, nontender, nondistended.  Without guarding or rebound. Extremities: Strength diminished globally, right lower extremity 4/5, left lower extremity 3/5 Skin:  Warm and dry, no erythema.  Data Reviewed: I have personally reviewed following labs and imaging studies  CBC: Recent Labs  Lab 11/05/22 0359 11/06/22 0207  WBC 14.5* 14.7*  HGB 12.6* 13.1  HCT 38.0* 39.7  MCV 81.0 78.8*  PLT 260 309    Basic Metabolic Panel: Recent Labs  Lab 11/05/22 0359 11/06/22 0207 11/07/22 0046 11/08/22 0124  NA 130* 132* 131* 132*  K 4.3 4.3 4.6 3.6  CL 99 100 100 102  CO2 20* 24 20* 21*  GLUCOSE 83 89 107*  119*  BUN 31* 33* 41* 37*  CREATININE 1.19 1.14 1.24 1.10  CALCIUM 8.1* 8.4* 8.4* 8.2*  MG 1.9 2.5* 2.5* 2.3  PHOS 2.8 3.5 3.1 3.0    GFR: Estimated Creatinine Clearance: 74.7 mL/min (by C-G formula based on SCr of 1.1 mg/dL).  Recent Labs  Lab 11/05/22 1724  AMMONIA 25    Coagulation Profile: No results for input(s): "INR", "PROTIME" in the last 168 hours.  CBG: Recent Labs  Lab 11/10/22 0608 11/10/22 1151  11/10/22 1628 11/10/22 2108 11/11/22 0612  GLUCAP 122* 118* 110* 124* 111*    No results found for this or any previous visit (from the past 240 hour(s)).    Radiology Studies: No results found.  Scheduled Meds:  (feeding supplement) PROSource Plus  30 mL Oral BID BM   aspirin  81 mg Oral Daily   atorvastatin  80 mg Oral QHS   bacitracin   Topical BID   Chlorhexidine Gluconate Cloth  6 each Topical Daily   docusate sodium  100 mg Oral Daily   feeding supplement  1 Container Oral TID BM   heparin injection (subcutaneous)  5,000 Units Subcutaneous Q8H   insulin aspart  0-9 Units Subcutaneous TID AC & HS   melatonin  3 mg Oral QHS   pantoprazole  40 mg Oral Daily   polyethylene glycol  17 g Oral Daily   Continuous Infusions:  sodium chloride     sodium chloride Stopped (11/06/22 1304)   promethazine (PHENERGAN) injection (IM or IVPB)       LOS: 12 days   Time spent:  Azucena Fallen, DO [facility]  If 7PM-7AM, please contact night-coverage [email or web address]  11/11/2022, 7:08 AM

## 2022-11-11 NOTE — PMR Pre-admission (Signed)
PMR Admission Coordinator Pre-Admission Assessment  Patient: Mathew [name] is an 72 y.o., male MRN: [id number] DOB: 1950-10-28 Height: 6\' 3"  (190.5 cm) Weight: 91 kg  Insurance Information HMO:     PPO:  yes    PCP:      IPA:      80/20: yes     OTHER:  PRIMARY: BCBS Commercial       Policy#: [id number]       Subscriber: Pt CM Name: naveneet       Phone#: [phone]     Fax#: [phone] Approved on 7/16 for admit 7-16-7/22 with update due 0/27 Pre-Cert#:  [id number].      Employer:  Benefits:  Phone #:  Eff Date: 05/06/2022 - 05/06/2023 Deductible: $1,250 ($1,250 met) OOP Max: $4,000 ($4,000 met) CIR: 80% coverage; 20% co-insurance SNF: 80% coverage; 20% co-insurance, limited to 60 days/cal yr Outpatient:  80% coverage; 20% co-insurance Home Health:  80% coverage; 20% co-insurance DME: 80% coverage; 20% co-insurance Providers: in network   SECONDARY: Medicare Part A       Policy#:  [id number]     Phone#:   Artist:       Phone#:   The Engineer, materials Information Summary" for patients in Inpatient Rehabilitation Facilities with attached "Privacy Act Statement-Health Care Records" was provided and verbally reviewed with: Patient  Emergency Contact Information Contact Information     Name Relation Home Work Mobile   [place] Spouse [phone]  [phone]       Current Medical History  Patient Admitting Diagnosis: Ruptured Thoracic Aortic Aneurysm, CVA History of Present Illness:Mathew [name] is a 72 year old right-handed male with history 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  Per chart review patient lives with spouse.  Two-level home bed bath main level 2 steps to entry.  Independent and working prior to admission.  Presented 10/25/2022 with chest pain radiating to the abdomen.   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  He was started on nitroglycerin infusion.  Given need for tight hemodynamic control PCCM consulted for further management and admission.  VVS and cardiology consulted.  Initially required Esmolol drip, weaned off with the addition of multiple p.o. medications.  Blood pressures normalized and patient was deemed stable for discharge 10/29/2022 due to no expansion of dissection ultimately returning on  6/26 for worsening pain or imaging confirmed aortic rupture requiring endovascular repair by vascular surgery Dr. Chestine Spore.  Hospital course 11/01/2022 patient with left lower extremity weakness of acute onset.  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  CT angiogram head and neck showed no acute intracranial process no intracranial large vessel occlusion or significant stenosis.  No hemodynamically significant stenosis in the carotid and vertebral arteries.  Neurology follow-up and patient was cleared to begin low-dose aspirin.  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 Mosetta Putt and patient not felt to be a candidate for further cancer treatments at this time and patient is currently DNR.  Mild AKI improved with gentle IV fluids latest creatinine 1.17.  Patient was cleared to begin  subcutaneous heparin for DVT prophylaxis.  Palliative care was consulted to establish goals of care.  His diet has been advanced to regular consistency.  Therapy evaluations completed due to patient's deconditioning as well as left-sided weakness was admitted for a comprehensive rehab program.      Complete NIHSS TOTAL: 3  Patient's medical record from [facility] has been reviewed by the rehabilitation admission coordinator and physician.  Past Medical History  Past Medical History:  Diagnosis Date   Hepatitis C    s/p treatment   Hypertension    Obstructive sleep apnea    unable to tolerate CPAP   Premature ventricular contraction    Prostate cancer (HCC) 2006    Has the patient had major surgery during 100 days prior to admission? Yes  Family History   family history includes Cancer in his brother.  Current Medications  Current Facility-Administered Medications:    (feeding supplement) PROSource Plus liquid 30 mL, 30 mL, Oral, BID BM, Azucena Fallen, MD, 30 mL at 11/10/22 1641   0.9 %  sodium chloride infusion, 500 mL, Intravenous, Once PRN, Rhyne, Samantha J, PA-C   0.9 %  sodium chloride infusion, 250 mL, Intravenous, Continuous, Chand, Sudham, MD, Stopped at 11/06/22 1304   alum & mag hydroxide-simeth (MAALOX/MYLANTA) 200-200-20 MG/5ML suspension 15-30 mL, 15-30 mL, Oral, Q2H PRN, Nada Libman, MD   aspirin chewable tablet 81 mg, 81 mg, Oral, Daily, Steffanie Dunn, DO, 81 mg at 11/11/22 1122   atorvastatin (LIPITOR) tablet 80 mg, 80 mg, Oral, QHS, Rhyne, Samantha J, PA-C, 80 mg at 11/10/22 2151   bacitracin ointment, , Topical, BID, Baglia, Corrina, PA-C, 31.5 Application at 11/10/22 2143   bisacodyl (DULCOLAX) suppository 10 mg, 10 mg, Rectal, Daily PRN, Janeann Forehand D, NP, 10 mg at 11/04/22 1013   docusate sodium (COLACE) capsule 100 mg, 100 mg, Oral, BID PRN, Briant Sites, DO   docusate sodium (COLACE) capsule 100 mg, 100 mg, Oral, Daily, Nada Libman, MD, 100 mg at 11/10/22 1610   feeding supplement (BOOST / RESOURCE BREEZE) liquid 1 Container, 1 Container, Oral, TID BM, Azucena Fallen, MD, 1 Container at 11/10/22 1641   heparin injection 5,000 Units, 5,000 Units, Subcutaneous, Q8H, Rhyne, Samantha J, PA-C, 5,000 Units at 11/11/22 0657   HYDROmorphone (DILAUDID) injection 0.5 mg, 0.5 mg,  Intravenous, Q2H PRN, Rhyne, Samantha J, PA-C, 0.5 mg at 11/01/22 0640   insulin aspart (novoLOG) injection 0-9 Units, 0-9 Units, Subcutaneous, TID AC & HS, Azucena Fallen, MD, 1 Units at 11/10/22 2151   loperamide (IMODIUM) capsule 2 mg, 2 mg, Oral, PRN, Azucena Fallen, MD, 2 mg at 11/11/22 1122   melatonin tablet 3 mg, 3 mg, Oral, QHS, Harris, Whitney D, NP, 3 mg at 11/10/22 2151   ondansetron (ZOFRAN) injection 4 mg, 4 mg, Intravenous, Q6H PRN, Nada Libman, MD, 4 mg at 11/10/22 1950   Oral care mouth rinse, 15 mL, Mouth Rinse, PRN, Briant Sites, DO   oxyCODONE-acetaminophen (PERCOCET/ROXICET) 5-325 MG per tablet 1-2 tablet, 1-2 tablet, Oral, Q4H PRN, Rhyne, Samantha J, PA-C, 2 tablet at 11/08/22 0751   pantoprazole (PROTONIX) EC tablet 40 mg, 40 mg, Oral, Daily, Nada Libman, MD, 40 mg at 11/11/22 1122   phenol (CHLORASEPTIC) mouth spray 1 spray, 1 spray, Mouth/Throat, PRN, Rhyne, Samantha J, PA-C, 1 spray at 11/06/22 1807   polyethylene glycol (MIRALAX / GLYCOLAX) packet 17 g, 17 g, Oral, Daily, Harris, Whitney D, NP, 17 g  at 11/07/22 0857   promethazine (PHENERGAN) 12.5 mg in sodium chloride 0.9 % 50 mL IVPB, 12.5 mg, Intravenous, Q6H PRN, Azucena Fallen, MD   senna (SENOKOT) tablet 8.6 mg, 1 tablet, Oral, Daily PRN, Steffanie Dunn, DO   traZODone (DESYREL) tablet 50 mg, 50 mg, Oral, QHS PRN, Janeann Forehand D, NP, 50 mg at 11/04/22 2237  Patients Current Diet:  Diet Order             DIET DYS 3 Room service appropriate? Yes; Fluid consistency: Thin  Diet effective now                   Precautions / Restrictions Precautions Precautions: Fall Precaution Comments: cortrak, O2, NG tube Restrictions Weight Bearing Restrictions: No   Has the patient had 2 or more falls or a fall with injury in the past year? No  Prior Activity Level Community (5-7x/wk): Pt. active in the community PTA  Prior Functional Level Self Care: Did the patient need help  bathing, dressing, using the toilet or eating? Independent  Indoor Mobility: Did the patient need assistance with walking from room to room (with or without device)? Independent  Stairs: Did the patient need assistance with internal or external stairs (with or without device)? Independent  Functional Cognition: Did the patient need help planning regular tasks such as shopping or remembering to take medications? Independent  Patient Information Are you of Hispanic, Latino/a,or Spanish origin?: A. No, not of Hispanic, Latino/a, or Spanish origin What is your race?: B. Black or African American Do you need or want an interpreter to communicate with a doctor or health care staff?: 0. No  Patient's Response To:  Health Literacy and Transportation Is the patient able to respond to health literacy and transportation needs?: Yes Health Literacy - How often do you need to have someone help you when you read instructions, pamphlets, or other written material from your doctor or pharmacy?: Never In the past 12 months, has lack of transportation kept you from medical appointments or from getting medications?: No In the past 12 months, has lack of transportation kept you from meetings, work, or from getting things needed for daily living?: No  Home Assistive Devices / Equipment Home Equipment: None  Prior Device Use: Indicate devices/aids used by the patient prior to current illness, exacerbation or injury? None of the above  Current Functional Level Cognition  Arousal/Alertness: Awake/alert Overall Cognitive Status: Within Functional Limits for tasks assessed Orientation Level: Oriented X4 Following Commands: Follows one step commands with increased time General Comments: pt very fatigued, talks softly, requires constant verbal cues to complete task Attention: Sustained Sustained Attention: Impaired Sustained Attention Impairment: Verbal complex Memory: Impaired Memory Impairment: Storage  deficit Problem Solving: Impaired Problem Solving Impairment: Verbal complex, Functional basic Executive Function: Organizing Organizing: Impaired Organizing Impairment: Verbal basic, Verbal complex    Extremity Assessment (includes Sensation/Coordination)  Upper Extremity Assessment: Generalized weakness (mild weakness compared to RUE, functional for BADL, able to brush teeth, pt is RHD)  Lower Extremity Assessment: Defer to PT evaluation LLE Deficits / Details: grossly 1/5, reports normal sensation    ADLs  Overall ADL's : Needs assistance/impaired Eating/Feeding: Moderate assistance, Sitting, Bed level Grooming: Set up, Sitting, Wash/dry face Upper Body Bathing: Moderate assistance, Sitting, Bed level Lower Body Bathing: Maximal assistance, Sit to/from stand Lower Body Bathing Details (indicate cue type and reason): assistance with peri area back while standing Upper Body Dressing : Moderate assistance, Sitting, Bed level Lower Body Dressing:  Maximal assistance, Sitting/lateral leans, Bed level Toilet Transfer: Maximal assistance Toilet Transfer Details (indicate cue type and reason): with use of RW, mod A for [employer]- Clothing Manipulation and Hygiene: Total assistance Functional mobility during ADLs: Moderate assistance    Mobility  Overal bed mobility: Needs Assistance Bed Mobility: Rolling, Sidelying to Sit Rolling: Min guard Sidelying to sit: Min assist Supine to sit: Min assist, HOB elevated Sit to supine: Min assist Sit to sidelying: Max assist, +2 for physical assistance General bed mobility comments: hand hold when attemtping to pull into sitting    Transfers  Overall transfer level: Needs assistance Equipment used: Ambulation equipment used, Rolling walker (2 wheels) Transfers: Sit to/from Stand, Bed to chair/wheelchair/BSC Sit to Stand: Mod assist, Max assist Bed to/from chair/wheelchair/BSC transfer type:: Squat pivot, Step pivot Squat pivot  transfers: Mod assist Step pivot transfers: Mod assist Transfer via Lift Equipment: Stedy General transfer comment: modA for initial transfers, PT provides cues for foot placement, trunk flexion, rocking to gain momentum, and hand placement. minA sit to stand with cues for technique from elevated surface.    Ambulation / Gait / Stairs / Wheelchair Mobility  Ambulation/Gait General Gait Details: unable Pre-gait activities: pt steps from bedside commode to chair, tends to sink into knee and hip flexion with stepping    Posture / Balance Dynamic Sitting Balance Sitting balance - Comments: sitting in recliner Balance Overall balance assessment: Needs assistance Sitting-balance support: No upper extremity supported, Feet supported Sitting balance-Leahy Scale: Fair Sitting balance - Comments: sitting in recliner Standing balance support: Bilateral upper extremity supported, Reliant on assistive device for balance Standing balance-Leahy Scale: Poor Standing balance comment: minA with BUE support    Special needs/care consideration Skin surgical incision, sternal precautions   Previous Home Environment (from acute therapy documentation) Living Arrangements: Spouse/significant other  Lives With: Spouse Available Help at Discharge: Family, Available PRN/intermittently (can work toward 24/7 assist) Type of Home: House Home Layout: Two level, Able to live on main level with bedroom/bathroom, Bed/bath upstairs (only half bath is downstairs) Alternate Level Stairs-Rails: Can reach both Alternate Level Stairs-Number of Steps: flight Home Access: Ramped entrance, Stairs to enter Entrance Stairs-Rails: None Entrance Stairs-Number of Steps: 2 Bathroom Shower/Tub: Health visitor: Standard  Discharge Living Setting Plans for Discharge Living Setting: Patient's home Type of Home at Discharge: House Discharge Home Layout: Two level, Able to live on main level with bedroom/bathroom,  1/2 bath on main level Alternate Level Stairs-Rails: None Alternate Level Stairs-Number of Steps: full flight Discharge Home Access: Stairs to enter Entrance Stairs-Rails: Right, Left Entrance Stairs-Number of Steps: 2 Discharge Bathroom Shower/Tub: Tub/shower unit Discharge Bathroom Toilet: Standard Discharge Bathroom Accessibility: No Does the patient have any problems obtaining your medications?: No  Social/Family/Support Systems Patient Roles: Spouse Contact Information: [phone] Anticipated Caregiver: Abelino Tippin Anticipated Caregiver's Contact Information: [phone] Ability/Limitations of Caregiver: Can doe 24/7 Min A Caregiver Availability: 24/7 Discharge Plan Discussed with Primary Caregiver: Yes Is Caregiver In Agreement with Plan?: Yes Does Caregiver/Family have Issues with Lodging/Transportation while Pt is in Rehab?: No  Goals Patient/Family Goal for Rehab: PT/OT Min A Expected length of stay: 14-16 days Pt/Family Agrees to Admission and willing to participate: Yes Program Orientation Provided & Reviewed with Pt/Caregiver Including Roles  & Responsibilities: Yes  Decrease burden of Care through IP rehab admission: not anticipated   Possible need for SNF placement upon discharge: not anticipated  Patient Condition: I have reviewed medical records from [facility] , spoken with  CM, and patient and spouse. I met with patient at the bedside for inpatient rehabilitation assessment.  Patient will benefit from ongoing PT and OT, can actively participate in 3 hours of therapy a day 5 days of the week, and can make measurable gains during the admission.  Patient will also benefit from the coordinated team approach during an Inpatient Acute Rehabilitation admission.  The patient will receive intensive therapy as well as Rehabilitation physician, nursing, social worker, and care management interventions.  Due to safety, skin/wound care, disease management,  medication administration, pain management, and patient education the patient requires 24 hour a day rehabilitation nursing.  The patient is currently min-mod A with mobility and basic ADLs.  Discharge setting and therapy post discharge at home with home health is anticipated.  Patient has agreed to participate in the Acute Inpatient Rehabilitation Program and will admit today.  Preadmission Screen Completed By:  Jeronimo Greaves, 11/11/2022 1:10 PM ______________________________________________________________________   Discussed status with Dr. Berline Chough  on 11/19/22 at 930 and received approval for admission today.  Admission Coordinator:  Jeronimo Greaves, CCC-SLP, time 1000/Date 11/19/22   Assessment/Plan: Diagnosis: Does the need for close, 24 hr/day Medical supervision in concert with the patient's rehab needs make it unreasonable for this patient to be served in a less intensive setting? Yes Co-Morbidities requiring supervision/potential complications: Aortic aneurysm s/p grafting, HCC stage IV-  with mets - Hep C; PAF; smoker- just quit; prior prostectomy- due to prostate CA;  Due to bladder management, bowel management, safety, skin/wound care, disease management, medication administration, pain management, and patient education, does the patient require 24 hr/day rehab nursing? Yes Does the patient require coordinated care of a physician, rehab nurse, PT, OT, and SLP to address physical and functional deficits in the context of the above medical diagnosis(es)? Yes Addressing deficits in the following areas: balance, endurance, locomotion, strength, transferring, bowel/bladder control, bathing, dressing, feeding, grooming, and toileting Can the patient actively participate in an intensive therapy program of at least 3 hrs of therapy 5 days a week? Yes The potential for patient to make measurable gains while on inpatient rehab is good Anticipated functional outcomes upon discharge from inpatient  rehab: supervision and min assist PT, supervision and min assist OT, n/a SLP Estimated rehab length of stay to reach the above functional goals is: 14-18 days Anticipated discharge destination: Home 10. Overall Rehab/Functional Prognosis: good   MD Signature:

## 2022-11-11 NOTE — Progress Notes (Addendum)
  Progress Note    11/11/2022 7:32 AM 12 Days Post-Op  Subjective: resting comfortably. Only complaint is that he had an accident on himself this morning    Vitals:   11/10/22 2305 11/11/22 0303  BP: 135/76 136/84  Pulse: (!) 104 98  Resp: 20 20  Temp: 98.8 F (37.1 C) 98.5 F (36.9 C)  SpO2: 93% 96%    Physical Exam: Cardiac:  regular Lungs:  nonlabored Extremities:  palpable DP pulses bilaterally. Still with LLE weakness  CBC    Component Value Date/Time   WBC 14.7 (H) 11/06/2022 0207   RBC 5.04 11/06/2022 0207   HGB 13.1 11/06/2022 0207   HGB 18.4 (H) 10/23/2022 1417   HCT 39.7 11/06/2022 0207   PLT 309 11/06/2022 0207   PLT 238 10/23/2022 1417   MCV 78.8 (L) 11/06/2022 0207   MCH 26.0 11/06/2022 0207   MCHC 33.0 11/06/2022 0207   RDW 18.4 (H) 11/06/2022 0207   LYMPHSABS 3.0 10/23/2022 1417   MONOABS 0.7 10/23/2022 1417   EOSABS 0.1 10/23/2022 1417   BASOSABS 0.2 (H) 10/23/2022 1417    BMET    Component Value Date/Time   NA 132 (L) 11/08/2022 0124   K 3.6 11/08/2022 0124   CL 102 11/08/2022 0124   CO2 21 (L) 11/08/2022 0124   GLUCOSE 119 (H) 11/08/2022 0124   BUN 37 (H) 11/08/2022 0124   CREATININE 1.10 11/08/2022 0124   CREATININE 1.25 (H) 10/23/2022 1417   CALCIUM 8.2 (L) 11/08/2022 0124   GFRNONAA >60 11/08/2022 0124   GFRNONAA >60 10/23/2022 1417   GFRAA >60 06/12/2018 0815    INR    Component Value Date/Time   INR 1.3 (H) 11/01/2022 1551     Intake/Output Summary (Last 24 hours) at 11/11/2022 0732 Last data filed at 11/10/2022 1629 Gross per 24 hour  Intake 360 ml  Output 1350 ml  Net -990 ml      Assessment/Plan:  72 y.o. male is 12 days post op, s/p: emergent repair of ruptured left thoracic aortic aneurysm with endovascular stent graft and laser fenestration and subsequent stenting to the left subclavian artery    -Unfortunately had an accident on himself this morning but that is his only complaint -BLE still well perfused  with palpable DP pulses -LLE weakness with no significant change -Pending CIR admission   Loel Dubonnet, PA-C [facility] [phone] 11/11/2022 7:32 AM

## 2022-11-12 DIAGNOSIS — I71019 Dissection of thoracic aorta, unspecified: Secondary | ICD-10-CM | POA: Diagnosis not present

## 2022-11-12 LAB — GLUCOSE, CAPILLARY
Glucose-Capillary: 107 mg/dL — ABNORMAL HIGH (ref 70–99)
Glucose-Capillary: 119 mg/dL — ABNORMAL HIGH (ref 70–99)
Glucose-Capillary: 112 mg/dL — ABNORMAL HIGH (ref 70–99)
Glucose-Capillary: 125 mg/dL — ABNORMAL HIGH (ref 70–99)

## 2022-11-12 NOTE — Progress Notes (Signed)
Inpatient Rehab Admissions Coordinator:    CIR following. Note that palliative to see tomorrow to discuss goals of care. Will follow up after that.   Megan Salon, MS, CCC-SLP Rehab Admissions Coordinator  [phone] (celll) [phone] (office)

## 2022-11-12 NOTE — Progress Notes (Signed)
Palliative-   Consult received, chart reviewed- plan to meet patient and his spouse tomorrow at 1030am.   Ocie Bob, AGNP-C Palliative Medicine  No charge

## 2022-11-12 NOTE — Plan of Care (Signed)

## 2022-11-12 NOTE — Progress Notes (Addendum)
  Progress Note    11/12/2022 7:39 AM 13 Days Post-Op  Subjective:  denies any pain   Vitals:   11/11/22 1938 11/11/22 2311  BP: (!) 158/82 (!) 146/82  Pulse: (!) 103 (!) 107  Resp: 20 20  Temp: 99.2 F (37.3 C) 97.9 F (36.6 C)  SpO2: 94% 94%    Physical Exam: General:  resting comfortably, alert and oriented Cardiac:  regular Lungs:  nonlabored Extremities:  palpable DP pulses bilaterally   CBC    Component Value Date/Time   WBC 14.7 (H) 11/06/2022 0207   RBC 5.04 11/06/2022 0207   HGB 13.1 11/06/2022 0207   HGB 18.4 (H) 10/23/2022 1417   HCT 39.7 11/06/2022 0207   PLT 309 11/06/2022 0207   PLT 238 10/23/2022 1417   MCV 78.8 (L) 11/06/2022 0207   MCH 26.0 11/06/2022 0207   MCHC 33.0 11/06/2022 0207   RDW 18.4 (H) 11/06/2022 0207   LYMPHSABS 3.0 10/23/2022 1417   MONOABS 0.7 10/23/2022 1417   EOSABS 0.1 10/23/2022 1417   BASOSABS 0.2 (H) 10/23/2022 1417    BMET    Component Value Date/Time   NA 132 (L) 11/08/2022 0124   K 3.6 11/08/2022 0124   CL 102 11/08/2022 0124   CO2 21 (L) 11/08/2022 0124   GLUCOSE 119 (H) 11/08/2022 0124   BUN 37 (H) 11/08/2022 0124   CREATININE 1.10 11/08/2022 0124   CREATININE 1.25 (H) 10/23/2022 1417   CALCIUM 8.2 (L) 11/08/2022 0124   GFRNONAA >60 11/08/2022 0124   GFRNONAA >60 10/23/2022 1417   GFRAA >60 06/12/2018 0815    INR    Component Value Date/Time   INR 1.3 (H) 11/01/2022 1551     Intake/Output Summary (Last 24 hours) at 11/12/2022 0739 Last data filed at 11/12/2022 0500 Gross per 24 hour  Intake 50 ml  Output 1400 ml  Net -1350 ml      Assessment/Plan:  71 y.o. male is 13 days post op, s/p: emergent repair of ruptured left thoracic aortic aneurysm with endovascular stent graft and laser fenestration and subsequent stenting to the left subclavian artery   -No complaints this morning. Denies any pain in his legs -BLE warm and well perfused with palpable pedal pulses -Pending CIR  admission   Loel Dubonnet, PA-C [facility] [phone] 11/12/2022 7:39 AM  I agree with the above.  I have seen and examined the patient.  Hopefully to CIR soon  Durene Cal

## 2022-11-12 NOTE — Progress Notes (Signed)
PROGRESS NOTE    Aristide Waggle  ZOX:[id number] DOB: 1950-07-01 DOA: 10/30/2022 PCP: Patient, No Pcp Per   Brief Narrative:  72 year old male with PMHx significant for HCC on chemotherapy, paroxysmal atrial fibrillation, hypertension, and hepatitis C status post treatment.  He initially presented to [facility] Emergency department on 6/21 with complaints of sudden onset chest pain and was diagnosed with thrombosed type B thoracic aortic dissection extending from the distal aortic arch to the level of the diaphragm -initially discharged on the 25th due to no expansion, ultimately returning on the 26th for worsening pain where imaging confirmed aortic rupture requiring endovascular repair by vascular surgery.  Hospital Course: 6/25 Discharged with stable type B Ao dissection with intramural thrombosis  6/26 Returned for admittance  with ascending Ao rupture. Emergent OR for repair 6/27 Patient developed left-sided weakness, MRI brain noted for multiple acute infarcts with embolic pattern, MRI C-spine with cord ischemia on the right side.   6/28 Lumbar drain placed 6/30 Started on vasopressor support to maintain SBP greater than 140 7/1 Vascular continue plans to continue lumbar drain per vascular 7/2 Nausea with emesis, . Increased O2 requirement with faint bibasilar crackles. CXR + Lasix x 1. Lumbar drain removed. 7/4 Coretrak clogged -ultimately removed as patient is tolerating p.o. 7/5 -continues to progress with PT, currently medically stable for discharge, awaiting safe disposition for SNF versus inpatient rehab 7/8 discussion with Oncology reveals no real therapy options - palliative care consulted late in day to help transition/explain to patient options moving forward. 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  Palliative care meeting 11/13/2022 Family continues to be helpful patient can transition to inpatient rehab subsequently home with more independence and safety.  Discussed subsequently patient's disease will likely continue to progress and planning for the future -Family hoping for inpatient rehab but if declined would need to discuss palliative follow up at SNF vs possibly home with hospice pending further discussions.  Ruptured type B aortic dissection s/p emergent TEVAR, complicated by spinal cord ischemia and embolic strokes, but he is showing signs of recovery of motor function suggesting against complete spinal infarction.  6/26 > L subclavian was covered during surgery 6/28 CT angiogram showed stable repaired segment of aorta -Vascular surgery following, appreciate insight recommendations -Blood pressure well-controlled, off pressors since 7/1 -spinal drain removed 7/2 without incident   Acute bilateral multifocal embolic/ischemic strokes -MRI Brain showed bilateral multifocal embolic ischemic stroke -Continues to improve with ongoing physical therapy -Continue aspirin, statin  Cervical spinal cord edema, concern for infarct Ambulatory dysfunction -MRI C-spine showing spinal cord signal in the C6-C7, edema versus stroke.  -Most likely etiology for L>R leg weakness, now improving but not yet back to baseline  Acute transient metabolic encephalopathy, resolved Rule out hospital delirium, resolved -Appreciate stroke team's management. Secondary stroke prevention, focus on rehab. -Mental status slowly returned to baseline  with supportive care -No indication for further imaging   Acute kidney injury likely ATN in the setting of hemodynamic insults, resolved  Hyponatremia NAG acidosis 2/2 renal failure -Continue to advance diet, appetite continues to be poor -liberalize diet in hopes to improve caloric intake  Questionable COPD exacerbation, resolved (no formal diagnosis) Prolonged history of tobacco abuse OSA -Discussed smoking cessation -Off oxygen as of 7/4 -Out of bed, continue PT/OT pending any restrictions given prior spinal drain has been removed.   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  -On atezolizumab plus bevacizumab every 3 weeks prior to admission.  - Will need outpatient follow-up with oncology. Atezolizumab has been associated with aortic dissection previously per FDA drug information sheet (combined in the 6% fatal adverse outcomes associated with the drug). Retrospective trials have also shown associations with dissections associated with bevacizumab, but the FDA has not included this in the drug information sheet to date.  -Previous team discussed with wife that oncologist should be aware of dissection as this may change course of therapy    DVT prophylaxis: heparin injection 5,000 Units Start: 11/06/22 1400 SCDs Start: 10/30/22 1959 Code Status:   Code Status: Full Code Family Communication: None present  Status is: Inpatient  Dispo: The patient is from: Home              Anticipated d/c is to: Inpatient rehab              Anticipated d/c date is: Imminent pending placement              Patient currently is medically stable for discharge  Consultants:  Vascular surgery, PCCM  Procedures:  Endovascular repair of ruptured thoracic aortic aneurysm with coverage of left subclavian artery   Antimicrobials:   Perioperatively  Subjective: No acute issues or events overnight, pain currently well-controlled, patient up ambulating with PT today, wife at bedside denies nausea vomiting diarrhea constipation any fevers chills or chest pain.  Objective: Vitals:   11/11/22 1555 11/11/22 1938 11/11/22 2311 11/12/22 0605  BP: (!) 159/79 (!) 158/82 (!) 146/82   Pulse: 88 (!) 103 (!) 107   Resp: 20 20 20    Temp: 98.8 F (37.1 C) 99.2 F (37.3 C) 97.9 F (36.6 C)   TempSrc: Oral Oral Oral   SpO2: 93% 94% 94%   Weight:    90.4 kg  Height:        Intake/Output Summary (Last 24 hours) at 11/12/2022 0735 Last data filed at 11/12/2022 0500 Gross per 24 hour  Intake 50 ml  Output 1400 ml  Net -1350 ml    Filed Weights   11/09/22 0334 11/10/22 0425 11/12/22 0605  Weight: 89.2 kg 91 kg 90.4 kg    Examination:  General:  Pleasantly resting in bed, No acute distress. HEENT:  Normocephalic atraumatic.  Sclerae nonicteric, noninjected.  Extraocular movements intact bilaterally. Neck:  Without mass or deformity.  Trachea is midline. Lungs:  Clear to auscultate bilaterally without rhonchi, wheeze, or rales. Heart:  Regular rate and rhythm.  Without murmurs, rubs, or gallops. Abdomen:  Soft, nontender, nondistended.  Without guarding or rebound. Extremities: Right lower extremity 4/5, left lower extremity 3/5 (left leg proximal muscles 2/5) Skin:  Warm and dry, no erythema.  Data Reviewed: I have personally reviewed following labs and imaging studies  CBC: Recent Labs  Lab 11/06/22 0207  WBC 14.7*  HGB 13.1  HCT 39.7  MCV 78.8*  PLT 309    Basic Metabolic Panel: Recent Labs  Lab 11/06/22  0207 11/07/22 0046 11/08/22 0124  NA 132* 131* 132*  K 4.3 4.6 3.6  CL 100 100 102  CO2 24 20* 21*  GLUCOSE 89 107* 119*  BUN 33* 41* 37*  CREATININE 1.14 1.24 1.10  CALCIUM 8.4* 8.4* 8.2*  MG 2.5* 2.5* 2.3  PHOS 3.5 3.1 3.0    GFR: Estimated Creatinine Clearance: 74.7 mL/min (by C-G formula  based on SCr of 1.1 mg/dL).  Recent Labs  Lab 11/05/22 1724  AMMONIA 25    Coagulation Profile: No results for input(s): "INR", "PROTIME" in the last 168 hours.  CBG: Recent Labs  Lab 11/11/22 0612 11/11/22 1211 11/11/22 1552 11/11/22 2059 11/12/22 0604  GLUCAP 111* 110* 121* 106* 107*    No results found for this or any previous visit (from the past 240 hour(s)).    Radiology Studies: No results found.  Scheduled Meds:  (feeding supplement) PROSource Plus  30 mL Oral BID BM   aspirin  81 mg Oral Daily   atorvastatin  80 mg Oral QHS   bacitracin   Topical BID   docusate sodium  100 mg Oral Daily   feeding supplement  1 Container Oral TID BM   heparin injection (subcutaneous)  5,000 Units Subcutaneous Q8H   insulin aspart  0-9 Units Subcutaneous TID AC & HS   melatonin  3 mg Oral QHS   pantoprazole  40 mg Oral Daily   polyethylene glycol  17 g Oral Daily   Continuous Infusions:  sodium chloride     sodium chloride Stopped (11/06/22 1304)   promethazine (PHENERGAN) injection (IM or IVPB) 12.5 mg (11/11/22 2110)     LOS: 13 days   Time spent:  Azucena Fallen, DO [facility]  If 7PM-7AM, please contact night-coverage [email or web address]  11/12/2022, 7:35 AM

## 2022-11-12 NOTE — Progress Notes (Signed)
Physical Therapy Treatment Patient Details Name: Mathew [name] MRN: [id number] DOB: [birth year] Today's Date: 11/12/2022   History of Present Illness Pt is a 72 y/o M presenting to ED on 6/21 with chest pain and EKG with concern for ST elevation d/c'd 6/25, returned 6/26 and was urgently taken to OR. 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  Post op day 1 pt with L sided weakness, concern for spinal cord ischemia vs CVA. 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    PT Comments  Pt received in supine and agreeable to session. Pt reporting increased muscle soreness and fatigue this session limiting activity tolerance. Pt demonstrating improved bed mobility requiring up to min guard for safety. Pt able to stand from elevated EOB with mod A, but requires mod A +2 from lower surfaces. Pt requesting to use the [facility], however is unable to have BM. Pt able to perform 2 step transfers with RW and min A, however defers further ambulation due to fatigue. Pt reports glute weakness, which is demonstrated with trunk and hip flexion during transfers. Pt instructed in glute sets and other seated therex to perform independently for strengthening. Pt continues to benefit from PT services to progress toward functional mobility goals.      Assistance Recommended at Discharge Frequent or constant Supervision/Assistance  If plan is discharge home, recommend the following:  Can travel by private vehicle    A lot of help with walking and/or transfers;A lot of help with bathing/dressing/bathroom;Assist for transportation;Help with stairs or ramp for entrance      Equipment Recommendations  Wheelchair  (measurements PT);Wheelchair cushion (measurements PT);Rolling walker (2 wheels);BSC/3in1    Recommendations for Other Services       Precautions / Restrictions Precautions Precautions: Fall Restrictions Weight Bearing Restrictions: No     Mobility  Bed Mobility Overal bed mobility: Needs Assistance Bed Mobility: Supine to Sit     Supine to sit: HOB elevated, Min guard     General bed mobility comments: Use of bedrail and increased time    Transfers Overall transfer level: Needs assistance Equipment used: Rolling walker (2 wheels) Transfers: Sit to/from Stand, Bed to chair/wheelchair/BSC Sit to Stand: Mod assist, +2 safety/equipment, From elevated surface, +2 physical assistance   Step pivot transfers: Min assist       General transfer comment: Pt able to stand from elevated EOB with mod A for power up after unsuccessful attempt from lower surface. Pt requiring mod A +2 to stand from [facility] due to weakness and lower surface. Cues for hand placement. Pt able to step pivot from bed > BSC and from [facility] > recliner with min A for balance and cues for upright posture due to trunk flexion.       Balance Overall balance assessment: Needs assistance Sitting-balance support: No upper extremity supported, Feet supported Sitting balance-Leahy Scale: Fair Sitting balance - Comments: sitting EOB   Standing balance support: Bilateral upper extremity supported, Reliant on assistive device for balance, During functional activity Standing balance-Leahy Scale: Poor Standing balance comment: with RW support and min A  Cognition Arousal/Alertness: Awake/alert Behavior During Therapy: WFL for tasks assessed/performed Overall Cognitive Status: Within Functional Limits for tasks assessed                                          Exercises General Exercises - Lower Extremity Long Arc Quad: AROM, Seated, Both, 5 reps    General  Comments        Pertinent Vitals/Pain Pain Assessment Pain Assessment: Faces Faces Pain Scale: Hurts little more Pain Location: skin and muscles Pain Descriptors / Indicators: Discomfort, Sore Pain Intervention(s): Monitored during session, Repositioned     PT Goals (current goals can now be found in the care plan section) Acute Rehab PT Goals Patient Stated Goal: get better PT Goal Formulation: With patient/family Time For Goal Achievement: 11/19/22 Potential to Achieve Goals: Good Progress towards PT goals: Progressing toward goals    Frequency    Min 1X/week      PT Plan Current plan remains appropriate       AM-PAC PT "6 Clicks" Mobility   Outcome Measure  Help needed turning from your back to your side while in a flat bed without using bedrails?: A Little Help needed moving from lying on your back to sitting on the side of a flat bed without using bedrails?: A Little Help needed moving to and from a bed to a chair (including a wheelchair)?: A Lot Help needed standing up from a chair using your arms (e.g., wheelchair or bedside chair)?: A Lot Help needed to walk in hospital room?: Total Help needed climbing 3-5 steps with a railing? : Total 6 Click Score: 12    End of Session Equipment Utilized During Treatment: Gait belt Activity Tolerance: Patient limited by fatigue Patient left: in chair;with call bell/phone within reach;with chair alarm set;with family/visitor present Nurse Communication: Mobility status PT Visit Diagnosis: Unsteadiness on feet (R26.81);Muscle weakness (generalized) (M62.81);Difficulty in walking, not elsewhere classified (R26.2)     Time: 9147-8295 PT Time Calculation (min) (ACUTE ONLY): 18 min  Charges:    $Therapeutic Activity: 8-22 mins PT General Charges $$ ACUTE PT VISIT: 1 Visit                    Johny Shock, PTA Acute Rehabilitation Services Secure Chat Preferred  [phone]    Johny Shock 11/12/2022,  10:36 AM

## 2022-11-13 ENCOUNTER — Other Ambulatory Visit: Payer: BC Managed Care – PPO

## 2022-11-13 ENCOUNTER — Ambulatory Visit: Payer: BC Managed Care – PPO

## 2022-11-13 ENCOUNTER — Ambulatory Visit: Payer: BC Managed Care – PPO | Admitting: Hematology

## 2022-11-13 DIAGNOSIS — R29898 Other symptoms and signs involving the musculoskeletal system: Secondary | ICD-10-CM | POA: Diagnosis not present

## 2022-11-13 DIAGNOSIS — C220 Liver cell carcinoma: Secondary | ICD-10-CM | POA: Diagnosis not present

## 2022-11-13 DIAGNOSIS — Z66 Do not resuscitate: Secondary | ICD-10-CM | POA: Diagnosis not present

## 2022-11-13 DIAGNOSIS — I81 Portal vein thrombosis: Secondary | ICD-10-CM | POA: Diagnosis not present

## 2022-11-13 DIAGNOSIS — Z7189 Other specified counseling: Secondary | ICD-10-CM

## 2022-11-13 DIAGNOSIS — G9511 Acute infarction of spinal cord (embolic) (nonembolic): Secondary | ICD-10-CM | POA: Diagnosis not present

## 2022-11-13 DIAGNOSIS — I71019 Dissection of thoracic aorta, unspecified: Secondary | ICD-10-CM | POA: Diagnosis not present

## 2022-11-13 DIAGNOSIS — I639 Cerebral infarction, unspecified: Secondary | ICD-10-CM | POA: Insufficient documentation

## 2022-11-13 DIAGNOSIS — I71012 Dissection of descending thoracic aorta: Secondary | ICD-10-CM | POA: Diagnosis not present

## 2022-11-13 DIAGNOSIS — Z515 Encounter for palliative care: Secondary | ICD-10-CM | POA: Diagnosis not present

## 2022-11-13 LAB — GLUCOSE, CAPILLARY
Glucose-Capillary: 122 mg/dL — ABNORMAL HIGH (ref 70–99)
Glucose-Capillary: 115 mg/dL — ABNORMAL HIGH (ref 70–99)
Glucose-Capillary: 136 mg/dL — ABNORMAL HIGH (ref 70–99)

## 2022-11-13 MED ORDER — PHENYLEPHRINE HCL-NACL 20-0.9 MG/250ML-% IV SOLN
INTRAVENOUS | Status: AC
  Filled 2022-11-13: qty 250

## 2022-11-13 MED ORDER — LABETALOL HCL 5 MG/ML IV SOLN
10.0000 mg | INTRAVENOUS | Status: DC | PRN
  Administered 2022-11-13 – 2022-11-15 (×3): 10 mg via INTRAVENOUS
  Filled 2022-11-13 (×3): qty 4

## 2022-11-13 MED ORDER — PROPOFOL 500 MG/50ML IV EMUL
INTRAVENOUS | Status: AC
  Filled 2022-11-13: qty 250

## 2022-11-13 NOTE — Progress Notes (Addendum)
Speech Language Pathology Treatment: Cognitive-Linquistic  Patient Details Name: Mathew [name] No MRN: [id number] DOB: 04-22-1951 Today's Date: 11/13/2022 Time: 1342-1400 SLP Time Calculation (min) (ACUTE ONLY): 18 min  Assessment / Plan / Recommendation Clinical Impression  Pt seen for cognitive intervention. He had made significant improvements over hospital course. Session focused on memory, awareness and problem solving. He states he has not been having hallucinations like he was in the ICU. He was able to accurately recall events of his day (confirmed by notes from providers) stating specifics from PT session and conversation with Palliative care provider. He was able to recall planned disposition is CIR (inpatient rehab). Pt recalled what medications he is taking and used good reasoning about asking MD which medications are still relevant and during discussion with Palliative care re: code status. Pt also was able to review a "mock" pill box activity/sheet and answer questions with minimal cues needed. He stated how he uses the call button when he needs assistance. Pt is showing functional cognition for the acute care venue and has met his goals. He can be reassessed at next venue of care for higher level executive functioning if deemed necessary. ST will sign off.    HPI HPI: Patient is a 72 y.o. male with PMH: prostate cancer, hepatitis C, HTN, OSA (unable to tolerate CPAP), h/o heptocellular cancer(getting chemotherapy). He presented to the hospital on 10/25/22 with chest and abdominal pain with workup revealing type B aortic dissection and was treated with blood pressure medication and pain management. Follow up CT did not show significant change. He discharged home 6/25 but in AM 6/26 his chest pain returned so he came back to the hospital.  MRI brain showed punctate acute infarcts in the right cerebellum, splenium of the  corpus callosum on the left, right occipital lobe, right parietal lobe, and  left lentiform nucleus.      SLP Plan  All goals met;Discharge SLP treatment due to (comment)      Recommendations for follow up therapy are one component of a multi-disciplinary discharge planning process, led by the attending physician.  Recommendations may be updated based on patient status, additional functional criteria and insurance authorization.    Recommendations                   Rehab consult Oral care BID   Set up Supervision/Assistance Cognitive communication deficit (R41.841)     All goals met;Discharge SLP treatment due to (comment) Cognition is functional for acute venue of care. Can be reassessed at next venue of care for higher level cognition if needed.      Royce Macadamia  11/13/2022, 2:07 PM

## 2022-11-13 NOTE — Progress Notes (Signed)
Physical Therapy Treatment Patient Details Name: Mathew [name] MRN: [id number] DOB: 1950/12/11 Today's Date: 11/13/2022   History of Present Illness Pt is a 72 y/o M presenting to ED on 6/21 with chest pain and EKG with concern for ST elevation d/c'd 6/25, returned 6/26 and was urgently taken to OR. 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  Post op day 1 pt with L sided weakness, concern for spinal cord ischemia vs CVA. 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    PT Comments  Pt received in supine and agreeable to session. Pt continuing to require mod A +2 to stand due to BLE weakness. Pt able to tolerate a gait trial around the bed this session with up to min A for balance due to increased knee buckling with progressed distance and fatigue. Pt able to perform seated therex with pt able to perform LAQ with LLE this session. Pt requesting to return to supine at end of session. Pt continues to benefit from PT services to progress toward functional mobility goals.      Assistance Recommended at Discharge Frequent or constant Supervision/Assistance  If plan is discharge home, recommend the following:  Can travel by private vehicle    A lot of help with walking and/or transfers;A lot of help with bathing/dressing/bathroom;Assist for transportation;Help with stairs or ramp for entrance      Equipment Recommendations  Wheelchair (measurements PT);Wheelchair cushion (measurements PT);Rolling walker (2 wheels);BSC/3in1    Recommendations for Other Services       Precautions / Restrictions Precautions Precautions: Fall Restrictions Weight Bearing Restrictions: No LLE Weight  Bearing: Weight bearing as tolerated     Mobility  Bed Mobility Overal bed mobility: Needs Assistance Bed Mobility: Supine to Sit, Sit to Supine     Supine to sit: Min guard, HOB elevated Sit to supine: Min assist   General bed mobility comments: Min A to elevate BLE back to EOB    Transfers Overall transfer level: Needs assistance Equipment used: Rolling walker (2 wheels) Transfers: Sit to/from Stand Sit to Stand: Mod assist, +2 physical assistance, From elevated surface           General transfer comment: STS from elevated EOB with mod A +2 after unsuccessful attempt from slightly lower surface    Ambulation/Gait Ambulation/Gait assistance: Min assist Gait Distance (Feet): 10 Feet Assistive device: Rolling walker (2 wheels) Gait Pattern/deviations: Step-through pattern, Decreased stride length, Trunk flexed, Knees buckling       General Gait Details: Pt demonstrating slow, short step-through pattern with increased knee instability with progressed distance due to fatigue. Min A for balance and cues for upright posture, RW proximity, and RW management       Balance Overall balance assessment: Needs assistance Sitting-balance support: No upper extremity supported, Feet supported Sitting balance-Leahy Scale: Fair Sitting balance - Comments: sitting EOB   Standing balance support: Bilateral upper extremity supported, Reliant on assistive device for balance, During functional activity Standing balance-Leahy Scale: Poor Standing balance comment: with RW support and min A                            Cognition Arousal/Alertness: Awake/alert Behavior During Therapy: [facility] for  tasks assessed/performed Overall Cognitive Status: Within Functional Limits for tasks assessed                                          Exercises General Exercises - Lower Extremity Long Arc Quad: AROM, Seated, Both, 5 reps Hip Flexion/Marching: AROM, Seated, Both, 5  reps    General Comments        Pertinent Vitals/Pain Pain Assessment Pain Assessment: Faces Faces Pain Scale: Hurts a little bit Pain Location: LUE Pain Descriptors / Indicators: Sore Pain Intervention(s): Monitored during session     PT Goals (current goals can now be found in the care plan section) Acute Rehab PT Goals Patient Stated Goal: get better PT Goal Formulation: With patient/family Time For Goal Achievement: 11/19/22 Potential to Achieve Goals: Good Progress towards PT goals: Progressing toward goals    Frequency    Min 1X/week      PT Plan Current plan remains appropriate       AM-PAC PT "6 Clicks" Mobility   Outcome Measure  Help needed turning from your back to your side while in a flat bed without using bedrails?: A Little Help needed moving from lying on your back to sitting on the side of a flat bed without using bedrails?: A Little Help needed moving to and from a bed to a chair (including a wheelchair)?: A Little Help needed standing up from a chair using your arms (e.g., wheelchair or bedside chair)?: Total Help needed to walk in hospital room?: A Little Help needed climbing 3-5 steps with a railing? : Total 6 Click Score: 14    End of Session Equipment Utilized During Treatment: Gait belt Activity Tolerance: Patient limited by fatigue Patient left: in bed;with call bell/phone within reach;with bed alarm set Nurse Communication: Mobility status PT Visit Diagnosis: Unsteadiness on feet (R26.81);Muscle weakness (generalized) (M62.81);Difficulty in walking, not elsewhere classified (R26.2)     Time: 1000-1020 PT Time Calculation (min) (ACUTE ONLY): 20 min  Charges:    $Gait Training: 8-22 mins PT General Charges $$ ACUTE PT VISIT: 1 Visit                    Johny Shock, PTA Acute Rehabilitation Services Secure Chat Preferred  [phone]    Johny Shock 11/13/2022, 10:57 AM

## 2022-11-13 NOTE — Consult Note (Addendum)
Consultation Note Date: 11/13/2022   Patient Name: Mathew [name]  DOB: 1951-04-02  MRN: [id number]  Age / Sex: 72 y.o., male  PCP: Patient, No Pcp Per Referring Physician: Noralee Stain, DO  Reason for Consultation:  advanced cancer, new strokes, aortic dissection  HPI/Patient Profile: 72 y.o. male  with past medical history of stage IVa hepatocellular cancer (more than one tumor, no distant metastasis) was on chemotherapy prior to admission, a-fib, HTN, Hep C post tx, admitted on 10/30/2022 with aortic rupture requiring endovascular repair by vascular surgery. His recovery has been complicated by L sided weakness and MRI brain confirmed multiple embolic infarcts, and MRI C-spine showed R side cord ischemia. Palliative consulted for advanced care planning.   Primary Decision Maker PATIENT- surrogate decision maker is spouse- Mathew [name]  Discussion: Chart reviewed including labs, progress notes, imaging from this and previous encounters.  Noted per note by Dr. Mosetta Putt on 7/5-   "He is actually recovering reasonably well, and is motivated to go to inpatient rehab to regain his strength -Bevacizumab would be contraindicated due to the aorta dissection and embolic stroke -Plan to hold his cancer treatment while he is recovering from acute events, not sure if he is able to get more cancer treatment.  If he recovers well, we can try immunotherapy again, he is not a good candidate for TKI's due to the presence of vascular events."  Met with patient and his spouse at the bedside. Discussed his current acute issues and chronic illnesses.  He and spouse are  hopeful that CIR will improve his ability to move his lower extremity and be able to return  home.  We discussed concerns that he will not be able to receive further chemotherapy treatments.  If he is not going to be offered further chemotherapy then they would want  hospice after CIR. Hospice services and philosophy of care were discussed.   Encouraged patient/family to consider DNR/DNI status understanding evidenced based poor outcomes in similar hospitalized patients, as the cause of the arrest is likely associated with chronic/terminal disease rather than a reversible acute cardio-pulmonary event.  Patient does not want attempts at resuscitation when he dies.  He hopes to have all of his gathered together before he dies.  He did not have any symptom complaints.   SUMMARY OF RECOMMENDATIONS -GOC are to d/c to CIR and then home- if patient is not offered further treatment for his cancer, then he agrees for hospice support -DNR   -Addendum- called back to bedside by RN. Patient questioning armband- notes he doesn't want to walk around with an armband saying DNR- we discussed that the armband is only for in the hospital. Also again discussed the meaning of DNR as above. Patient expressed his desire to speak with Dr. Mosetta Putt regarding his treatment options and change his code status back to Full Code. Code status changed back to Full Code.   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    Physical Exam Vitals and nursing note reviewed.  Constitutional:      General: He is not in acute distress. Cardiovascular:     Rate and Rhythm: Normal rate.  Pulmonary:     Effort: Pulmonary effort is normal.  Neurological:     Mental Status: He is alert and oriented to person, place, and time.     Vital Signs: BP (!) 144/90 (BP Location: Right Arm)   Pulse 83   Temp 98 F (36.7 C) (Oral)   Resp 14   Ht 6\' 3"  (1.905 m)   Wt 87.8 kg   SpO2 92%    BMI 24.20 kg/m  Pain Scale: 0-10   Pain Score: 0-No pain   SpO2: SpO2: 92 % O2 Device:SpO2: 92 % O2 Flow Rate: .O2 Flow Rate (L/min): 0 L/min  IO: Intake/output summary:  Intake/Output Summary (Last 24 hours) at 11/13/2022 1109 Last data filed at 11/13/2022 1610 Gross per 24 hour  Intake 730 ml  Output 1100 ml  Net -370 ml    LBM: Last BM Date : 11/11/22 Baseline Weight: Weight: 84.8 kg Most recent weight: Weight: 87.8 kg       Thank you for this consult. Palliative medicine will continue to follow and assist as needed.   Greater than 50%  of this time was spent counseling and coordinating care related to the above assessment and plan.  Total time: 90 minutes  Signed by: Ocie Bob, AGNP-C Palliative Medicine    Please contact Palliative Medicine Team phone at [phone] for questions and concerns.  For individual provider: See Loretha Stapler

## 2022-11-13 NOTE — Progress Notes (Signed)
PROGRESS NOTE    Mathew [name]  WUJ:[id number] DOB: 1950-10-12 DOA: 10/30/2022 PCP: Patient, No Pcp Per     Brief Narrative:  Mathew [name] is a 72 year old male with PMHx significant for HCC on chemotherapy, paroxysmal atrial fibrillation, hypertension, and hepatitis C status post treatment.  He initially presented to [facility] Emergency department on 6/21 with complaints of sudden onset chest pain and was diagnosed with thrombosed type B thoracic aortic dissection extending from the distal aortic arch to the level of the diaphragm -initially discharged on the 25th due to no expansion, ultimately returning on the 26th for worsening pain where imaging confirmed aortic rupture requiring endovascular repair by vascular surgery.   Hospital Course: 6/25 Discharged with stable type B Ao dissection with intramural thrombosis  6/26 Returned for admittance with ascending Ao rupture. Emergent OR for repair 6/27 Patient developed left-sided weakness, MRI brain noted for multiple acute infarcts with embolic pattern, MRI C-spine with cord ischemia on the right side.   6/28 Lumbar drain placed 6/30 Started on vasopressor support to maintain SBP greater than 140 7/1 Vascular surgery continue plans to continue lumbar drain  7/2 Nausea with emesis, . Increased O2 requirement with faint bibasilar crackles. CXR + Lasix x 1. Lumbar drain removed. 7/4 Coretrak clogged -ultimately removed as patient is tolerating p.o. 7/5 Continues to progress with PT, currently medically stable for discharge, awaiting safe disposition for SNF versus inpatient rehab 7/8 Discussion with Oncology reveals no real therapy options - palliative care consulted 7/9 Dr. Natale Milch discussed goals of care with patient and wife at bedside, plan for palliative care meeting 7/10 Family meeting with palliative care   New events last 24 hours / Subjective: Patient about to eat breakfast.  He has no complaints on examination today.   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)   Normal anion gap metabolic acidosis   Malnutrition of moderate degree   CVA (cerebral vascular accident) (HCC)   Rupture type B aortic dissection status post emergent T EVAR complicated by spinal cord ischemia, embolic stroke -Vascular surgery following  Acute bilateral multifocal embolic/ischemic strokes -MRI Brain showed bilateral multifocal embolic ischemic stroke -Continues to improve with ongoing physical therapy -Continue aspirin, lipitor    Cervical spinal cord edema, concern for infarct Ambulatory dysfunction -MRI C-spine showing spinal cord signal in the C6-C7, edema versus stroke.  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  -On atezolizumab plus bevacizumab every 3 weeks prior to admission.  -Will need outpatient follow-up with oncology. Atezolizumab has been associated with aortic dissection previously per FDA drug information sheet (combined in the 6% fatal adverse outcomes associated with the drug). Retrospective trials have also shown associations with dissections associated with bevacizumab, but the FDA has not included this in the drug information sheet to date.   Acute transient metabolic encephalopathy, resolved Acute kidney injury likely ATN in the setting of hemodynamic insults, resolved  Questionable COPD exacerbation, resolved  Prolonged history of tobacco abuse  Goals of care -Palliative care meeting with family and patient today -  discussed  with PMT today  -Likely plan would be to go to inpatient rehab versus SNF versus home with hospice  DVT prophylaxis:  heparin injection 5,000 Units Start: 11/06/22 1400 SCDs Start: 10/30/22 1959  Code Status: Full  Family Communication: None at bedside Disposition Plan: CIR pending  Status is: Inpatient Remains inpatient appropriate because: disposition     Antimicrobials:  Anti-infectives (From admission, onward)    Start     Dose/Rate Route Frequency Ordered Stop   11/05/22 1400  ceFAZolin (ANCEF) IVPB 1 g/50 mL premix  Status:  Discontinued        1 g 100 mL/hr over 30 Minutes Intravenous Every 8 hours 11/05/22 0742 11/05/22 1839   11/04/22 2200  ceFAZolin (ANCEF) IVPB 1 g/50 mL premix  Status:  Discontinued        1 g 100 mL/hr over 30 Minutes Intravenous Every 8 hours 11/04/22 1116 11/05/22 0742   11/02/22 1015  ceFAZolin (ANCEF) IVPB 1 g/50 mL premix  Status:  Discontinued        1 g 100 mL/hr over 30 Minutes Intravenous Every 12 hours 11/02/22 0922 11/04/22 1116   10/30/22 2200  ceFAZolin (ANCEF) IVPB 2g/100 mL premix        2 g 200 mL/hr over 30 Minutes Intravenous Every 8 hours 10/30/22 2108 10/31/22 0541        Objective: Vitals:   11/13/22 0338 11/13/22 0340 11/13/22 0830 11/13/22 1219  BP: 134/78  (!) 144/90 (!) 140/88  Pulse: 94  83   Resp: 15  14   Temp: 98.4 F (36.9 C)  98 F (36.7 C)   TempSrc: Oral  Oral Oral  SpO2: 93%  92%   Weight:  87.8 kg    Height:        Intake/Output Summary (Last 24 hours) at 11/13/2022 1305 Last data filed at 11/13/2022 4098 Gross per 24 hour  Intake 730 ml  Output 1100 ml  Net -370 ml   Filed Weights   11/10/22 0425 11/12/22 0605 11/13/22 0340  Weight: 91 kg 90.4 kg 87.8 kg    Examination:  General exam: Appears calm and comfortable  Respiratory system: Clear to auscultation. Respiratory effort normal. No respiratory distress. No conversational dyspnea.  Cardiovascular system: S1 & S2 heard, RRR.  No murmurs. No pedal edema. Gastrointestinal system: Abdomen is nondistended, soft and nontender. Normal bowel sounds heard. Central nervous system: Alert and oriented. No focal neurological deficits. Speech clear.  Extremities: Symmetric in appearance  Skin: No rashes, lesions or ulcers on exposed skin  Psychiatry: Judgement and insight appear normal. Mood & affect appropriate.   Data Reviewed: I have personally reviewed following labs and imaging studies  CBC: No results for input(s): "WBC", "NEUTROABS", "HGB", "HCT", "MCV", "PLT" in the last 168 hours. Basic Metabolic Panel: Recent Labs  Lab 11/07/22 0046 11/08/22 0124  NA 131* 132*  K 4.6 3.6  CL 100 102  CO2 20* 21*  GLUCOSE 107* 119*  BUN 41* 37*  CREATININE 1.24 1.10  CALCIUM 8.4* 8.2*  MG 2.5* 2.3  PHOS 3.1 3.0   GFR: Estimated Creatinine Clearance: 74.7 mL/min (by C-G formula based on SCr of 1.1 mg/dL). Liver Function Tests: No results for input(s): "AST", "ALT", "ALKPHOS", "BILITOT", "PROT", "ALBUMIN" in the last 168 hours. No results for input(s): "LIPASE", "AMYLASE" in the last 168 hours. No results for input(s): "AMMONIA" in the last 168 hours. Coagulation Profile: No results for input(s): "INR", "PROTIME" in the last 168 hours. Cardiac Enzymes: No results  for input(s): "CKTOTAL", "CKMB", "CKMBINDEX", "TROPONINI" in the last 168 hours. BNP (last 3 results) No results for input(s): "PROBNP" in the last 8760 hours. HbA1C: No results for input(s): "HGBA1C" in the last 72 hours. CBG: Recent Labs  Lab 11/12/22 1216 11/12/22 1746 11/12/22 2042 11/13/22 0630 11/13/22 1218  GLUCAP 119* 112* 125* 122* 115*   Lipid Profile: No results for input(s): "CHOL", "HDL", "LDLCALC", "TRIG", "CHOLHDL", "LDLDIRECT" in the last 72 hours. Thyroid Function Tests: No results for input(s): "TSH", "T4TOTAL", "FREET4", "T3FREE", "THYROIDAB" in the last 72 hours. Anemia Panel: No results for input(s): "VITAMINB12", "FOLATE",  "FERRITIN", "TIBC", "IRON", "RETICCTPCT" in the last 72 hours. Sepsis Labs: No results for input(s): "PROCALCITON", "LATICACIDVEN" in the last 168 hours.  No results found for this or any previous visit (from the past 240 hour(s)).    Radiology Studies: No results found.    Scheduled Meds:  aspirin  81 mg Oral Daily   atorvastatin  80 mg Oral QHS   bacitracin   Topical BID   docusate sodium  100 mg Oral Daily   feeding supplement  1 Container Oral TID BM   heparin injection (subcutaneous)  5,000 Units Subcutaneous Q8H   melatonin  3 mg Oral QHS   pantoprazole  40 mg Oral Daily   polyethylene glycol  17 g Oral Daily   Continuous Infusions:  sodium chloride Stopped (11/06/22 1304)   promethazine (PHENERGAN) injection (IM or IVPB) 12.5 mg (11/11/22 2110)     LOS: 14 days   Time spent: 35 minutes   Noralee Stain, DO [facility] 11/13/2022, 1:05 PM   Available via Epic secure chat 7am-7pm After these hours, please refer to coverage provider listed on [email or web address]

## 2022-11-13 NOTE — Progress Notes (Signed)
  Progress Note    11/13/2022 8:14 AM 14 Days Post-Op  Subjective:  denies any pain. Feels comfortable laying in bed    Vitals:   11/13/22 0308 11/13/22 0338  BP:  134/78  Pulse: 93 94  Resp: 14 15  Temp:  98.4 F (36.9 C)  SpO2: 94% 93%    Physical Exam: General:  alert and oriented x3, no acute distress Cardiac:  regular Lungs:  nonlabored Extremities:  palpable DP pulses bilaterally, improving motor in the left leg   CBC    Component Value Date/Time   WBC 14.7 (H) 11/06/2022 0207   RBC 5.04 11/06/2022 0207   HGB 13.1 11/06/2022 0207   HGB 18.4 (H) 10/23/2022 1417   HCT 39.7 11/06/2022 0207   PLT 309 11/06/2022 0207   PLT 238 10/23/2022 1417   MCV 78.8 (L) 11/06/2022 0207   MCH 26.0 11/06/2022 0207   MCHC 33.0 11/06/2022 0207   RDW 18.4 (H) 11/06/2022 0207   LYMPHSABS 3.0 10/23/2022 1417   MONOABS 0.7 10/23/2022 1417   EOSABS 0.1 10/23/2022 1417   BASOSABS 0.2 (H) 10/23/2022 1417    BMET    Component Value Date/Time   NA 132 (L) 11/08/2022 0124   K 3.6 11/08/2022 0124   CL 102 11/08/2022 0124   CO2 21 (L) 11/08/2022 0124   GLUCOSE 119 (H) 11/08/2022 0124   BUN 37 (H) 11/08/2022 0124   CREATININE 1.10 11/08/2022 0124   CREATININE 1.25 (H) 10/23/2022 1417   CALCIUM 8.2 (L) 11/08/2022 0124   GFRNONAA >60 11/08/2022 0124   GFRNONAA >60 10/23/2022 1417   GFRAA >60 06/12/2018 0815    INR    Component Value Date/Time   INR 1.3 (H) 11/01/2022 1551     Intake/Output Summary (Last 24 hours) at 11/13/2022 0814 Last data filed at 11/13/2022 1610 Gross per 24 hour  Intake 970 ml  Output 1100 ml  Net -130 ml      Assessment/Plan:  72 y.o. male is 14 days post op, s/p: emergent repair of ruptured left thoracic aortic aneurysm with endovascular stent graft and laser fenestration and subsequent stenting to the left subclavian artery    -No issues this morning. Denies any pain -Equal and easily palpable DP pulses bilaterally -LLE motor function  improving, not back at baseline yet -Pending CIR admission. Plans for goals of care discussion today with palliative   Loel Dubonnet, PA-C [facility] [phone] 11/13/2022 8:14 AM

## 2022-11-13 NOTE — Progress Notes (Addendum)
Mobility Specialist Progress Note:   11/13/22 1607  Mobility  Activity Ambulated with assistance in hallway  Level of Assistance Minimal assist, patient does 75% or more (+2)  Assistive Device Front wheel walker  Distance Ambulated (ft) 45 ft  LLE Weight Bearing WBAT  Activity Response Tolerated well  Mobility Referral Yes  $Mobility charge 1 Mobility  Mobility Specialist Start Time (ACUTE ONLY) 1515  Mobility Specialist Stop Time (ACUTE ONLY) 1600  Mobility Specialist Time Calculation (min) (ACUTE ONLY) 45 min    Pre Mobility: 117 HR , 168/95 BP , 99% SpO2 During Mobility: 119 HR , 94% SpO2 Post Mobility: 123 HR , 99% SpO2  Pt received in bed, agreeable to mobility. SV bed mobility. MinA +2 to stand. CG during ambulation. C/o feeling lightheaded and weak during ambulation. Seated rest break required d/t fatigue. Pt motivated to continue ambulation after seated rest break. Unable to ambulate to room d/t knee buckling. Pt wheeled in room with NT present and VSS.   Leory Plowman  Mobility Specialist Please contact via [employer] office at [phone]

## 2022-11-13 NOTE — Progress Notes (Signed)
Inpatient Rehab Admissions Coordinator:    CIR following. Palliative meeting pending, I will follow up with Pt. And family afterwards.  Megan Salon, MS, CCC-SLP Rehab Admissions Coordinator  [phone] (celll) [phone] (office)

## 2022-11-14 ENCOUNTER — Inpatient Hospital Stay (HOSPITAL_COMMUNITY): Payer: BC Managed Care – PPO

## 2022-11-14 DIAGNOSIS — I71019 Dissection of thoracic aorta, unspecified: Secondary | ICD-10-CM | POA: Diagnosis not present

## 2022-11-14 DIAGNOSIS — C220 Liver cell carcinoma: Secondary | ICD-10-CM | POA: Diagnosis not present

## 2022-11-14 DIAGNOSIS — M542 Cervicalgia: Secondary | ICD-10-CM

## 2022-11-14 DIAGNOSIS — Z7189 Other specified counseling: Secondary | ICD-10-CM | POA: Diagnosis not present

## 2022-11-14 LAB — URINALYSIS, ROUTINE W REFLEX MICROSCOPIC
Bilirubin Urine: NEGATIVE
Glucose, UA: NEGATIVE mg/dL
Hgb urine dipstick: NEGATIVE
Ketones, ur: NEGATIVE mg/dL
Leukocytes,Ua: NEGATIVE
Nitrite: NEGATIVE
Protein, ur: NEGATIVE mg/dL
Specific Gravity, Urine: 1.017 (ref 1.005–1.030)
pH: 5 (ref 5.0–8.0)

## 2022-11-14 LAB — BASIC METABOLIC PANEL
Anion gap: 7 (ref 5–15)
BUN: 30 mg/dL — ABNORMAL HIGH (ref 8–23)
CO2: 21 mmol/L — ABNORMAL LOW (ref 22–32)
Calcium: 8.1 mg/dL — ABNORMAL LOW (ref 8.9–10.3)
Chloride: 105 mmol/L (ref 98–111)
Creatinine, Ser: 1.26 mg/dL — ABNORMAL HIGH (ref 0.61–1.24)
GFR, Estimated: >60 mL/min (ref >60)
Glucose, Bld: 85 mg/dL (ref 70–99)
Potassium: 4.1 mmol/L (ref 3.5–5.1)
Sodium: 133 mmol/L — ABNORMAL LOW (ref 135–145)

## 2022-11-14 LAB — SARS CORONAVIRUS 2 BY RT PCR: SARS Coronavirus 2 by RT PCR: NEGATIVE

## 2022-11-14 LAB — CBC
HCT: 43.6 % (ref 39.0–52.0)
Hemoglobin: 13.8 g/dL (ref 13.0–17.0)
MCH: 25.7 pg — ABNORMAL LOW (ref 26.0–34.0)
MCHC: 31.7 g/dL (ref 30.0–36.0)
MCV: 81.3 fL (ref 80.0–100.0)
Platelets: 485 10*3/uL — ABNORMAL HIGH (ref 150–400)
RBC: 5.36 MIL/uL (ref 4.22–5.81)
RDW: 20.3 % — ABNORMAL HIGH (ref 11.5–15.5)
WBC: 15.5 10*3/uL — ABNORMAL HIGH (ref 4.0–10.5)
nRBC: 0.3 % — ABNORMAL HIGH (ref 0.0–0.2)

## 2022-11-14 LAB — GLUCOSE, CAPILLARY
Glucose-Capillary: 97 mg/dL (ref 70–99)
Glucose-Capillary: 85 mg/dL (ref 70–99)

## 2022-11-14 NOTE — Progress Notes (Signed)
Daily Progress Note   Patient Name: Mathew [name]       Date: 11/14/2022 DOB: February 02, 1951  Age: 72 y.o. MRN#: [id number] Attending Physician: Noralee Stain, DO Primary Care Physician: Patient, No Pcp Per Admit Date: 10/30/2022  Reason for Consultation/Follow-up: Establishing goals of care  Patient Profile/HPI:  72 y.o. male  with past medical history of stage IVa hepatocellular cancer (more than one tumor, no distant metastasis) was on chemotherapy prior to admission, a-fib, HTN, Hep C post tx, admitted on 10/30/2022 with aortic rupture requiring endovascular repair by vascular surgery. His recovery has been complicated by L sided weakness and MRI brain confirmed multiple embolic infarcts, and MRI C-spine showed R side cord ischemia. Palliative consulted for advanced care planning.    Subjective: Chart reviewed including labs, progress notes, imaging from this and previous encounters.  Patient in bed. Reports having some neck pain. No point tenderness, felt better with some massage. He feels it is muscular, does not associate it with previous pain of dissection/rupture. Would like a heating pad.  He continues to endorse full code/full scope care. He enjoyed walking in the hall yesterday and is looking forward to go to rehab.   Review of Systems  Musculoskeletal:  Positive for neck pain.     Physical Exam Vitals and nursing note reviewed.  Neurological:     Mental Status: He is alert and oriented to person, place, and time.     Comments: Flat affect             Vital Signs: BP (!) 148/82 (BP Location: Right Arm)   Pulse 93   Temp 98.6 F (37 C) (Oral)   Resp 16   Ht 6\' 3"  (1.905 m)   Wt 87.8 kg   SpO2 100%   BMI 24.20 kg/m  SpO2: SpO2: 100 % O2 Device: O2 Device: Room Air O2  Flow Rate: O2 Flow Rate (L/min): 2 L/min  Intake/output summary:  Intake/Output Summary (Last 24 hours) at 11/14/2022 1232 Last data filed at 11/14/2022 0500 Gross per 24 hour  Intake 730 ml  Output 1050 ml  Net -320 ml   LBM: Last BM Date : 11/11/22 Baseline Weight: Weight: 84.8 kg Most recent weight: Weight: 87.8 kg       Palliative Assessment/Data: PPS: 50%      Patient Active  Problem List   Diagnosis Date Noted   CVA (cerebral vascular accident) (HCC) 11/13/2022   Malnutrition of moderate degree 11/04/2022   Acute thoracic aortic dissection (HCC) 10/30/2022   Ascending aortic aneurysm, unspecified whether ruptured (HCC) 10/30/2022   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   Normal anion gap metabolic acidosis 10/30/2022   Malignant hypertension 10/28/2022   AKI (acute kidney injury) (HCC) 10/27/2022   Hypomagnesemia 10/27/2022   Metastatic malignant neoplasm (HCC) 10/26/2022   Dissection of descending thoracic aorta (HCC) 10/25/2022   Hepatocellular carcinoma (HCC) 09/03/2022   Liver mass 08/21/2022   Medication side effect 10/16/2021   IPF (idiopathic pulmonary fibrosis) (HCC) 08/17/2021   Smoker 08/17/2021    Palliative Care Assessment & Plan    Assessment/Recommendations/Plan  Continue current plan of care Hopeful for d/c to CIR K-pad for neck pain- notified attending MD of neck pain   Code Status: Full code  Prognosis:  Unable to determine  Discharge Planning: CIR  Care plan was discussed with patient and care team.   Thank you for allowing the Palliative Medicine Team to assist in the care of this patient.  Total time: 55 mins   Greater than 50%  of this time was spent counseling and coordinating care related to the above assessment and plan.  Ocie Bob, AGNP-C Palliative Medicine   Please contact Palliative Medicine Team phone at [phone] for questions and concerns.

## 2022-11-14 NOTE — Progress Notes (Signed)
Occupational Therapy Treatment Patient Details Name: Mathew [name] MRN: [id number] DOB: [birth year] Today's Date: 11/14/2022   History of present illness Pt is a 72 y/o M presenting to ED on 6/21 with chest pain and EKG with concern for ST elevation d/c'd 6/25, returned 6/26 and was urgently taken to OR. 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  Post op day 1 pt with L sided weakness, concern for spinal cord ischemia vs CVA. 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   OT comments  Pt in bed upon therapy arrival and agreeable to participate in OT treatment session. Requested to complete BLE exercises during session. While seated on EOB, pt completed BLE exercises focusing on increasing strength and endurance required for completing LB ADL tasks and functional transfers. Pt provided with verbal instruction and visual demonstration of exercises for proper form and technique. Verbalized neck pain when attempting resistive hip abduction. Exercise was eliminated d/t pain. HEP handout provided for reference. Nurse informed of pt's bed going in reverse Trendelenburg by itself. Pt continues to be appropriate for intensive inpatient follow up therapy, >3 hours/day. OT will continue to follow patient acutely.     Recommendations for follow up therapy are one component of a multi-disciplinary discharge planning process, led by the attending physician.  Recommendations may be updated based on patient status, additional functional criteria and insurance authorization.    Assistance Recommended at Discharge Frequent or constant Supervision/Assistance  Patient can return home with the following  A  little help with walking and/or transfers;A little help with bathing/dressing/bathroom;Help with stairs or ramp for entrance;Assist for transportation;Assistance with cooking/housework;Direct supervision/assist for financial management;Direct supervision/assist for medications management   Equipment Recommendations  Other (comment) (defer to next venue of care)       Precautions / Restrictions Precautions Precautions: Fall Restrictions Weight Bearing Restrictions: Yes LLE Weight Bearing: Weight bearing as tolerated       Mobility Bed Mobility Overal bed mobility: Needs Assistance Bed Mobility: Supine to Sit, Sit to Supine     Supine to sit: Supervision, HOB elevated Sit to supine: Min assist   General bed mobility comments: Min A to elevate BLE back onto bed when returning supine. Able to perform lateral scoot to the left seated on EOB without physical assist. Patient Response: Cooperative       Balance Overall balance assessment: Needs assistance Sitting-balance support: Bilateral upper extremity supported, Feet supported Sitting balance-Leahy Scale: Fair Sitting balance - Comments: sitting EOB. When engaging single LE and core during exercises, pt unable to maintain sitting balance demonstrating posterior bias Postural control: Posterior lean              ADL either performed or assessed with clinical judgement      Cognition Arousal/Alertness: Awake/alert Behavior During Therapy: WFL for tasks assessed/performed Overall Cognitive Status: Within Functional Limits for tasks assessed            Exercises General Exercises - Lower Extremity Quad Sets: AROM, Both, 10 reps, Seated Long Arc Quad: AROM, Both, 10 reps, Seated Heel Slides: AROM, Both, 10 reps, Seated, Limitations Heel Slides Limitations: towel placed under left foot to assist with movement and eliminate restriction from non skid socks Hip ABduction/ADduction: AROM, Both, 10 reps,  Seated,  Limitations Hip Abduction/Adduction Limitations: resisted hip adduction completed only using pillow between kness. Held for 3" each. Hip Flexion/Marching: AROM, Both, 10 reps, Seated, Limitations, AAROM Hip Flexion/Marching Limitations: AA/ROM required to complete with LLE. Toe Raises: AROM, Both, 10 reps, Seated Heel Raises: AROM, Both, 10 reps, Seated       General Comments VSS on RA. SpO2 92% HR 100-116bpm    Pertinent Vitals/ Pain       Pain Assessment Pain Assessment: Faces Faces Pain Scale: Hurts a little bit Pain Location: LLE with mobilization Pain Descriptors / Indicators: Grimacing, Sore Pain Intervention(s): Monitored during session, Repositioned         Frequency  Min 1X/week        Progress Toward Goals  OT Goals(current goals can now be found in the care plan section)  Progress towards OT goals: Progressing toward goals     Plan Discharge plan remains appropriate;Frequency remains appropriate       AM-PAC OT "6 Clicks" Daily Activity     Outcome Measure   Help from another person eating meals?: A Little Help from another person taking care of personal grooming?: A Little Help from another person toileting, which includes using toliet, bedpan, or urinal?: A Lot Help from another person bathing (including washing, rinsing, drying)?: A Lot Help from another person to put on and taking off regular upper body clothing?: A Lot Help from another person to put on and taking off regular lower body clothing?: A Lot 6 Click Score: 14    End of Session    OT Visit Diagnosis: Unsteadiness on feet (R26.81);Other abnormalities of gait and mobility (R26.89);Muscle weakness (generalized) (M62.81)   Activity Tolerance Patient tolerated treatment well;Patient limited by fatigue   Patient Left in bed;with call bell/phone within reach;with bed alarm set;with family/visitor present   Nurse Communication Other (comment) (pt's bed going in reverse trendelenberg by it's  self and need for new bed.)        Time: 1521-1600 OT Time Calculation (min): 39 min  Charges: OT General Charges $OT Visit: 1 Visit OT Treatments $Therapeutic Activity: 8-22 mins $Therapeutic Exercise: 23-37 mins  Limmie Patricia, OTR/L,CBIS  Supplemental OT - [facility] and [facility] Secure Chat Preferred    Nichola Darvin, Charisse March 11/14/2022, 4:17 PM

## 2022-11-14 NOTE — Progress Notes (Addendum)
  Progress Note    11/14/2022 7:21 AM 15 Days Post-Op  Subjective:  resting comfortably    Vitals:   11/14/22 0157 11/14/22 0429  BP: 128/83 130/80  Pulse: 98 95  Resp: (!) 22 14  Temp:  98.7 F (37.1 C)  SpO2: 95% 93%    Physical Exam: General:  no acute distress Cardiac:  regular Lungs:  nonlabored Extremities:  palpable DP pulses bilaterally, left leg weakness improving Abdomen:  soft, NT, ND  CBC    Component Value Date/Time   WBC 14.7 (H) 11/06/2022 0207   RBC 5.04 11/06/2022 0207   HGB 13.1 11/06/2022 0207   HGB 18.4 (H) 10/23/2022 1417   HCT 39.7 11/06/2022 0207   PLT 309 11/06/2022 0207   PLT 238 10/23/2022 1417   MCV 78.8 (L) 11/06/2022 0207   MCH 26.0 11/06/2022 0207   MCHC 33.0 11/06/2022 0207   RDW 18.4 (H) 11/06/2022 0207   LYMPHSABS 3.0 10/23/2022 1417   MONOABS 0.7 10/23/2022 1417   EOSABS 0.1 10/23/2022 1417   BASOSABS 0.2 (H) 10/23/2022 1417    BMET    Component Value Date/Time   NA 132 (L) 11/08/2022 0124   K 3.6 11/08/2022 0124   CL 102 11/08/2022 0124   CO2 21 (L) 11/08/2022 0124   GLUCOSE 119 (H) 11/08/2022 0124   BUN 37 (H) 11/08/2022 0124   CREATININE 1.10 11/08/2022 0124   CREATININE 1.25 (H) 10/23/2022 1417   CALCIUM 8.2 (L) 11/08/2022 0124   GFRNONAA >60 11/08/2022 0124   GFRNONAA >60 10/23/2022 1417   GFRAA >60 06/12/2018 0815    INR    Component Value Date/Time   INR 1.3 (H) 11/01/2022 1551     Intake/Output Summary (Last 24 hours) at 11/14/2022 0721 Last data filed at 11/14/2022 0500 Gross per 24 hour  Intake 970 ml  Output 1050 ml  Net -80 ml      Assessment/Plan:  72 y.o. male is 15 days post op, s/p: emergent repair of ruptured left thoracic aortic aneurysm with endovascular stent graft and laser fenestration and subsequent stenting to the left subclavian artery    -No complaints this morning. Had some generalized pain yesterday with nausea, but this was relieved with Phenergan. Abdomen is soft, NT, ND  this morning -BLE warm and well perfused with palpable DP pulses -LLE weakness is improving -Goals of care discussion yesterday with palliative. Patient plans to go to CIR then discharge to home from there. Hopefully to CIR soon   Loel Dubonnet, [facility] [phone] 11/14/2022 7:21 AM

## 2022-11-14 NOTE — Progress Notes (Signed)
SPO2 88% on room air at night time. 2 LPM of NCL was given. No SOB.   Last night Temp 100.4,  NSR/ST, HR 90s-110. BP 148/80-161/82 mmHg. Labetalol 10 mg IV was given. He has complaints of generalized pain and abdominal pain with nausea. Phenergan was given by RN day shift. Symptoms relieved and he was able to rest well.  We will continue to monitor.  Filiberto Pinks, RN

## 2022-11-14 NOTE — Progress Notes (Signed)
PROGRESS NOTE    Mathew [name]  YNW:[id number] DOB: 30-Jan-1951 DOA: 10/30/2022 PCP: Patient, No Pcp Per     Brief Narrative:  Mathew [name] is a 72 year old male with PMHx significant for HCC on chemotherapy, paroxysmal atrial fibrillation, hypertension, and hepatitis C status post treatment.  He initially presented to [facility] Emergency department on 6/21 with complaints of sudden onset chest pain and was diagnosed with thrombosed type B thoracic aortic dissection extending from the distal aortic arch to the level of the diaphragm -initially discharged on the 25th due to no expansion, ultimately returning on the 26th for worsening pain where imaging confirmed aortic rupture requiring endovascular repair by vascular surgery.   Hospital Course: 6/25 Discharged with stable type B Ao dissection with intramural thrombosis  6/26 Returned for admittance with ascending Ao rupture. Emergent OR for repair 6/27 Patient developed left-sided weakness, MRI brain noted for multiple acute infarcts with embolic pattern, MRI C-spine with cord ischemia on the right side.   6/28 Lumbar drain placed 6/30 Started on vasopressor support to maintain SBP greater than 140 7/1 Vascular surgery continue plans to continue lumbar drain  7/2 Nausea with emesis, . Increased O2 requirement with faint bibasilar crackles. CXR + Lasix x 1. Lumbar drain removed. 7/4 Coretrak clogged -ultimately removed as patient is tolerating p.o. 7/5 Continues to progress with PT, currently medically stable for discharge, awaiting safe disposition for SNF versus inpatient rehab 7/8 Discussion with Oncology reveals no real therapy options - palliative care consulted 7/9 Dr. Natale Milch discussed goals of care with patient and wife at bedside, plan for palliative care meeting 7/10 Family meeting with palliative care --> Full scope of care, wants to go to CIR  7/11 Fever 100.4   New events last 24 hours / Subjective: Fever 100.4 overnight.   He was also satting 88% on room air and required 2 L nasal cannula O2.  He denies any significant cough, although does endorse some chills.  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)   Normal anion gap metabolic acidosis   Malnutrition of moderate degree   CVA (cerebral vascular accident) (HCC)   Rupture type B aortic dissection status post emergent T EVAR complicated by spinal cord ischemia, embolic stroke -Vascular surgery following  Acute bilateral multifocal embolic/ischemic strokes -MRI Brain showed bilateral multifocal embolic ischemic stroke -Continues to improve with ongoing physical therapy -Continue aspirin, lipitor    Cervical spinal cord edema, concern for infarct Ambulatory dysfunction -MRI C-spine showing spinal cord signal in the C6-C7, edema versus stroke.  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  -On atezolizumab plus bevacizumab every 3 weeks prior to admission.  -Will need outpatient follow-up with oncology. Atezolizumab has been associated with aortic dissection previously per FDA drug information sheet (combined in the 6% fatal adverse outcomes associated with the drug). Retrospective trials have also shown associations with dissections associated with bevacizumab, but the FDA has not included this in the drug information sheet to date.   Acute transient metabolic encephalopathy, resolved Acute kidney injury likely ATN in the setting of hemodynamic insults, resolved  Questionable COPD  exacerbation, resolved  Prolonged history of tobacco  abuse  Goals of care -Appreciate palliative care medicine, continue full scope of care, looking forward to going to CIR  SIRS, not POA -Fever 100.4 overnight 7/10 to 7/11 associated with tachycardia and tachypnea, oxygen desaturation down to 88% requiring 2 L nasal cannula O2 -Blood cultures ordered -Chest x-ray reviewed independently, stable from previous examination with minimal atelectasis, no focal infiltrate -COVID test negative -Check UA -WBC elevated 15.5, although has been elevated previously and is not any worse, continue to watch fever curve -Monitor off antibiotics  DVT prophylaxis:  heparin injection 5,000 Units Start: 11/06/22 1400 SCDs Start: 10/30/22 1959  Code Status: Full  Family Communication: None at bedside Disposition Plan: CIR pending  Status is: Inpatient Remains inpatient appropriate because: CIR pending, monitor fever    Antimicrobials:  Anti-infectives (From admission, onward)    Start     Dose/Rate Route Frequency Ordered Stop   11/05/22 1400  ceFAZolin (ANCEF) IVPB 1 g/50 mL premix  Status:  Discontinued        1 g 100 mL/hr over 30 Minutes Intravenous Every 8 hours 11/05/22 0742 11/05/22 1839   11/04/22 2200  ceFAZolin (ANCEF) IVPB 1 g/50 mL premix  Status:  Discontinued        1 g 100 mL/hr over 30 Minutes Intravenous Every 8 hours 11/04/22 1116 11/05/22 0742   11/02/22 1015  ceFAZolin (ANCEF) IVPB 1 g/50 mL premix  Status:  Discontinued        1 g 100 mL/hr over 30 Minutes Intravenous Every 12 hours 11/02/22 0922 11/04/22 1116   10/30/22 2200  ceFAZolin (ANCEF) IVPB 2g/100 mL premix        2 g 200 mL/hr over 30 Minutes Intravenous Every 8 hours 10/30/22 2108 10/31/22 0541        Objective: Vitals:   11/14/22 0157 11/14/22 0429 11/14/22 0908 11/14/22 1202  BP: 128/83 130/80 127/78 (!) 148/82  Pulse: 98 95 92 93  Resp: (!) 22 14 15 16   Temp:  98.7 F (37.1 C) 98.4 F (36.9 C) 98.6 F (37 C)  TempSrc:  Oral Oral Oral  SpO2: 95% 93%  100% 100%  Weight:      Height:        Intake/Output Summary (Last 24 hours) at 11/14/2022 1306 Last data filed at 11/14/2022 1241 Gross per 24 hour  Intake 730 ml  Output 1150 ml  Net -420 ml   Filed Weights   11/10/22 0425 11/12/22 0605 11/13/22 0340  Weight: 91 kg 90.4 kg 87.8 kg    Examination:  General exam: Appears calm and comfortable  Respiratory system: Clear to auscultation. Respiratory effort normal. No respiratory distress. No conversational dyspnea.  Cardiovascular system: S1 & S2 heard, RRR. No murmurs. No pedal edema. Gastrointestinal system: Abdomen is nondistended, soft and nontender. Normal bowel sounds heard. Central nervous system: Alert and oriented. No focal neurological deficits. Speech clear.  Extremities: Symmetric in appearance  Skin: No rashes, lesions or ulcers on exposed skin  Psychiatry: Judgement and insight appear normal. Mood & affect appropriate.   Data Reviewed: I have personally reviewed following labs and imaging studies  CBC: Recent Labs  Lab 11/14/22 1033  WBC 15.5*  HGB 13.8  HCT 43.6  MCV 81.3  PLT 485*   Basic Metabolic Panel: Recent Labs  Lab 11/08/22 0124 11/14/22 1033  NA 132* 133*  K 3.6 4.1  CL 102 105  CO2 21* 21*  GLUCOSE 119* 85  BUN 37*  30*  CREATININE 1.10 1.26*  CALCIUM 8.2* 8.1*  MG 2.3  --   PHOS 3.0  --    GFR: Estimated Creatinine Clearance: 65.2 mL/min (A) (by C-G formula based on SCr of 1.26 mg/dL (H)). Liver Function Tests: No results for input(s): "AST", "ALT", "ALKPHOS", "BILITOT", "PROT", "ALBUMIN" in the last 168 hours. No results for input(s): "LIPASE", "AMYLASE" in the last 168 hours. No results for input(s): "AMMONIA" in the last 168 hours. Coagulation Profile: No results for input(s): "INR", "PROTIME" in the last 168 hours. Cardiac Enzymes: No results for input(s): "CKTOTAL", "CKMB", "CKMBINDEX", "TROPONINI" in the last 168 hours. BNP (last 3 results) No results for input(s): "PROBNP"  in the last 8760 hours. HbA1C: No results for input(s): "HGBA1C" in the last 72 hours. CBG: Recent Labs  Lab 11/12/22 2042 11/13/22 0630 11/13/22 1218 11/13/22 1601 11/14/22 1159  GLUCAP 125* 122* 115* 136* 97   Lipid Profile: No results for input(s): "CHOL", "HDL", "LDLCALC", "TRIG", "CHOLHDL", "LDLDIRECT" in the last 72 hours. Thyroid Function Tests: No results for input(s): "TSH", "T4TOTAL", "FREET4", "T3FREE", "THYROIDAB" in the last 72 hours. Anemia Panel: No results for input(s): "VITAMINB12", "FOLATE", "FERRITIN", "TIBC", "IRON", "RETICCTPCT" in the last 72 hours. Sepsis Labs: No results for input(s): "PROCALCITON", "LATICACIDVEN" in the last 168 hours.  Recent Results (from the past 240 hour(s))  SARS Coronavirus 2 by RT PCR (hospital order, performed in [facility] hospital lab) *cepheid single result test* Anterior Nasal Swab     Status: None   Collection Time: 11/14/22  9:30 AM   Specimen: Anterior Nasal Swab  Result Value Ref Range Status   SARS Coronavirus 2 by RT PCR NEGATIVE NEGATIVE Final    Comment: Performed at [facility], [address] [place], [place] [address]      Radiology Studies: DG Abd 1 View  Result Date: 11/14/2022 CLINICAL DATA:  Loss of appetite for several days, initial encounter EXAM: ABDOMEN - 1 VIEW COMPARISON:  11/05/2022 FINDINGS: Gastric catheter has been removed. Scattered large and small bowel gas is noted. No obstructive changes are seen. No free air is noted. No acute bony abnormality is seen. IMPRESSION: No acute abnormality noted. Electronically Signed   By: Alcide Clever M.D.   On: 11/14/2022 11:43   DG CHEST PORT 1 VIEW  Result Date: 11/14/2022 CLINICAL DATA:  Fever and loss of appetite, initial encounter EXAM: PORTABLE CHEST 1 VIEW COMPARISON:  11/05/2022 FINDINGS: Cardiac shadow is stable. Endovascular stent graft is again seen and stable. Mild atelectatic changes are again noted in the bases. No new focal infiltrate is  seen. No bony abnormality is noted. IMPRESSION: Stable basilar atelectasis. Electronically Signed   By: Alcide Clever M.D.   On: 11/14/2022 11:42      Scheduled Meds:  aspirin  81 mg Oral Daily   atorvastatin  80 mg Oral QHS   bacitracin   Topical BID   docusate sodium  100 mg Oral Daily   feeding supplement  1 Container Oral TID BM   heparin injection (subcutaneous)  5,000 Units Subcutaneous Q8H   melatonin  3 mg Oral QHS   pantoprazole  40 mg Oral Daily   polyethylene glycol  17 g Oral Daily   Continuous Infusions:  sodium chloride Stopped (11/06/22 1304)   promethazine (PHENERGAN) injection (IM or IVPB) 12.5 mg (11/13/22 1908)     LOS: 15 days   Time spent: 35 minutes   Noralee Stain, DO [facility] 11/14/2022, 1:06 PM   Available  via Epic secure chat 7am-7pm After these hours, please refer to coverage provider listed on [email or web address]

## 2022-11-14 NOTE — Progress Notes (Signed)
IP rehab admissions - I met with patient and his wife this am.  They do want to come to CIR.  We do not have a bed for this patient on rehab today.  I will have my partner follow up tomorrow for potential CIR admission.  Call for questions.  [phone]

## 2022-11-15 DIAGNOSIS — I71019 Dissection of thoracic aorta, unspecified: Secondary | ICD-10-CM | POA: Diagnosis not present

## 2022-11-15 LAB — GLUCOSE, CAPILLARY
Glucose-Capillary: 39 mg/dL — CL (ref 70–99)
Glucose-Capillary: 88 mg/dL (ref 70–99)
Glucose-Capillary: <10 mg/dL — CL (ref 70–99)
Glucose-Capillary: 119 mg/dL — ABNORMAL HIGH (ref 70–99)
Glucose-Capillary: 61 mg/dL — ABNORMAL LOW (ref 70–99)
Glucose-Capillary: 94 mg/dL (ref 70–99)
Glucose-Capillary: 113 mg/dL — ABNORMAL HIGH (ref 70–99)
Glucose-Capillary: 48 mg/dL — ABNORMAL LOW (ref 70–99)
Glucose-Capillary: 111 mg/dL — ABNORMAL HIGH (ref 70–99)
Glucose-Capillary: 95 mg/dL (ref 70–99)
Glucose-Capillary: 100 mg/dL — ABNORMAL HIGH (ref 70–99)
Glucose-Capillary: 123 mg/dL — ABNORMAL HIGH (ref 70–99)
Glucose-Capillary: 112 mg/dL — ABNORMAL HIGH (ref 70–99)
Glucose-Capillary: 175 mg/dL — ABNORMAL HIGH (ref 70–99)
Glucose-Capillary: 148 mg/dL — ABNORMAL HIGH (ref 70–99)
Glucose-Capillary: 121 mg/dL — ABNORMAL HIGH (ref 70–99)
Glucose-Capillary: 188 mg/dL — ABNORMAL HIGH (ref 70–99)

## 2022-11-15 LAB — COMPREHENSIVE METABOLIC PANEL
ALT: 19 U/L (ref 0–44)
AST: 139 U/L — ABNORMAL HIGH (ref 15–41)
Albumin: 1.6 g/dL — ABNORMAL LOW (ref 3.5–5.0)
Alkaline Phosphatase: 136 U/L — ABNORMAL HIGH (ref 38–126)
Anion gap: 8 (ref 5–15)
BUN: 30 mg/dL — ABNORMAL HIGH (ref 8–23)
CO2: 19 mmol/L — ABNORMAL LOW (ref 22–32)
Calcium: 8.1 mg/dL — ABNORMAL LOW (ref 8.9–10.3)
Chloride: 107 mmol/L (ref 98–111)
Creatinine, Ser: 1.38 mg/dL — ABNORMAL HIGH (ref 0.61–1.24)
GFR, Estimated: 55 mL/min — ABNORMAL LOW (ref >60)
Glucose, Bld: 68 mg/dL — ABNORMAL LOW (ref 70–99)
Potassium: 3.4 mmol/L — ABNORMAL LOW (ref 3.5–5.1)
Sodium: 134 mmol/L — ABNORMAL LOW (ref 135–145)
Total Bilirubin: 1.1 mg/dL (ref 0.3–1.2)
Total Protein: 7.5 g/dL (ref 6.5–8.1)

## 2022-11-15 LAB — CBC
HCT: 38.9 % — ABNORMAL LOW (ref 39.0–52.0)
Hemoglobin: 12.5 g/dL — ABNORMAL LOW (ref 13.0–17.0)
MCH: 26.1 pg (ref 26.0–34.0)
MCHC: 32.1 g/dL (ref 30.0–36.0)
MCV: 81.2 fL (ref 80.0–100.0)
Platelets: 373 10*3/uL (ref 150–400)
RBC: 4.79 MIL/uL (ref 4.22–5.81)
RDW: 20.3 % — ABNORMAL HIGH (ref 11.5–15.5)
WBC: 14.4 10*3/uL — ABNORMAL HIGH (ref 4.0–10.5)
nRBC: 0.3 % — ABNORMAL HIGH (ref 0.0–0.2)

## 2022-11-15 LAB — MAGNESIUM: Magnesium: 2.2 mg/dL (ref 1.7–2.4)

## 2022-11-15 LAB — TSH: TSH: 3.097 u[IU]/mL (ref 0.350–4.500)

## 2022-11-15 LAB — T4, FREE: Free T4: 1.26 ng/dL — ABNORMAL HIGH (ref 0.61–1.12)

## 2022-11-15 LAB — CORTISOL: Cortisol, Plasma: 32.6 ug/dL

## 2022-11-15 MED ORDER — DEXTROSE 50 % IV SOLN
1.0000 | INTRAVENOUS | Status: DC | PRN
  Administered 2022-11-15 (×2): 50 mL via INTRAVENOUS
  Filled 2022-11-15: qty 50

## 2022-11-15 MED ORDER — DEXTROSE 50 % IV SOLN
INTRAVENOUS | Status: AC
  Filled 2022-11-15: qty 50

## 2022-11-15 MED ORDER — DEXTROSE 10 % IV SOLN: INTRAVENOUS | Status: DC

## 2022-11-15 MED ORDER — DEXTROSE 50 % IV SOLN
INTRAVENOUS | Status: AC
  Administered 2022-11-15: 50 mL
  Filled 2022-11-15: qty 50

## 2022-11-15 MED ORDER — POTASSIUM CHLORIDE CRYS ER 20 MEQ PO TBCR
40.0000 meq | EXTENDED_RELEASE_TABLET | Freq: Once | ORAL | Status: AC
  Administered 2022-11-15: 40 meq via ORAL
  Filled 2022-11-15: qty 2

## 2022-11-15 NOTE — Progress Notes (Signed)
Physical Therapy Treatment Patient Details Name: Mathew [name] MRN: [id number] DOB: [birth year] Today's Date: 11/15/2022   History of Present Illness Pt is a 72 y/o M presenting to ED on 6/21 with chest pain and EKG with concern for ST elevation d/c'd 6/25, returned 6/26 and was urgently taken to OR. 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  Post op day 1 pt with L sided weakness, concern for spinal cord ischemia vs CVA. 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    PT Comments  Pt received in supine and agreeable to session with encouragement. Pt reporting increased fatigue and weakness due to events with low blood sugar this morning. Pt requires min A for bed mobility, mod A for standing, and min A for transfers this session. Pt limited by fatigue and weakness and unable to progress gait. Pt able to have a BM on BSC. Pt's HR noted to be tachy throughout session and elevate as high as 150 bpm, RN notified. Pt continues to benefit from PT services to progress toward functional mobility goals.      Assistance Recommended at Discharge Frequent or constant Supervision/Assistance  If plan is discharge home, recommend the following:  Can travel by private vehicle    A lot of help with walking and/or transfers;A lot of help with bathing/dressing/bathroom;Assist for transportation;Help with stairs or ramp for entrance      Equipment Recommendations  Wheelchair (measurements PT);Wheelchair cushion (measurements PT);Rolling walker (2 wheels);BSC/3in1    Recommendations for Other Services       Precautions / Restrictions Precautions Precautions: Fall Restrictions Weight Bearing  Restrictions: Yes LLE Weight Bearing: Weight bearing as tolerated     Mobility  Bed Mobility Overal bed mobility: Needs Assistance Bed Mobility: Supine to Sit, Sit to Supine     Supine to sit: Min assist, HOB elevated Sit to supine: Min guard   General bed mobility comments: Min A for trunk elevation    Transfers Overall transfer level: Needs assistance Equipment used: Rolling walker (2 wheels) Transfers: Sit to/from Stand, Bed to chair/wheelchair/BSC Sit to Stand: Mod assist, From elevated surface   Step pivot transfers: Min assist       General transfer comment: STS from elevated EOB and BSC with mod A for power up and cues for hand placement. Pt able to step pivot from bed <> BSC with min A for balance when transferring back to bed due to fatigue and increased trunk flexion.    Ambulation/Gait               General Gait Details: Unable to progress due to fatigue       Balance Overall balance assessment: Needs assistance Sitting-balance support: Bilateral upper extremity supported, Feet supported Sitting balance-Leahy Scale: Fair Sitting balance - Comments: sitting EOB   Standing balance support: Bilateral upper extremity supported, Reliant on assistive device for balance, During functional activity Standing balance-Leahy Scale: Poor Standing balance comment: with RW support and min A                            Cognition Arousal/Alertness: Awake/alert Behavior During Therapy: WFL for tasks assessed/performed Overall Cognitive Status: Within Functional Limits for tasks assessed  Exercises      General Comments General comments (skin integrity, edema, etc.): HR tachy throughout session and up to 150 bpm, RN notified      Pertinent Vitals/Pain Pain Assessment Pain Assessment: No/denies pain     PT Goals (current goals can now be found in the care plan section) Acute Rehab PT  Goals Patient Stated Goal: get better PT Goal Formulation: With patient/family Time For Goal Achievement: 11/19/22 Potential to Achieve Goals: Good Progress towards PT goals: Progressing toward goals    Frequency    Min 1X/week      PT Plan Current plan remains appropriate       AM-PAC PT "6 Clicks" Mobility   Outcome Measure  Help needed turning from your back to your side while in a flat bed without using bedrails?: A Little Help needed moving from lying on your back to sitting on the side of a flat bed without using bedrails?: A Little Help needed moving to and from a bed to a chair (including a wheelchair)?: A Little Help needed standing up from a chair using your arms (e.g., wheelchair or bedside chair)?: A Lot Help needed to walk in hospital room?: A Little Help needed climbing 3-5 steps with a railing? : Total 6 Click Score: 15    End of Session Equipment Utilized During Treatment: Gait belt Activity Tolerance: Patient limited by fatigue Patient left: in bed;with call bell/phone within reach Nurse Communication: Mobility status PT Visit Diagnosis: Unsteadiness on feet (R26.81);Muscle weakness (generalized) (M62.81);Difficulty in walking, not elsewhere classified (R26.2)     Time: 5643-3295 PT Time Calculation (min) (ACUTE ONLY): 30 min  Charges:    $Therapeutic Activity: 23-37 mins PT General Charges $$ ACUTE PT VISIT: 1 Visit                     Johny Shock, PTA Acute Rehabilitation Services Secure Chat Preferred  [phone]    Johny Shock 11/15/2022, 3:42 PM

## 2022-11-15 NOTE — Plan of Care (Signed)

## 2022-11-15 NOTE — Progress Notes (Signed)
Went into patients room he was unresponsive, and exhibiting an agonal type breathing pattern, BP 210/130 would not arouse to sternal rub. Rapid response RN paged and at the bedside. Blood sugar was less than 10 a total of 2.5 amps of D50 given pt is now nodding and following commands very minimal  speaking. Md on call paged new orders received

## 2022-11-15 NOTE — Progress Notes (Signed)
Inpatient Rehab Admissions Coordinator:  Pt not medically ready for potential CIR admission. Also await insurance authorization. Will continue to follow.   Wolfgang Phoenix, MS, CCC-SLP Admissions Coordinator [phone]

## 2022-11-15 NOTE — Progress Notes (Signed)
   11/15/22 0315  Vitals  Temp 99.1 F (37.3 C)  Temp Source Oral  BP (!) 143/81  MAP (mmHg) 98  BP Location Right Arm  BP Method Automatic  Patient Position (if appropriate) Lying  Pulse Rate (!) 105  Pulse Rate Source Monitor  ECG Heart Rate (!) 107  Resp 20   Prn labetalol given

## 2022-11-15 NOTE — Progress Notes (Signed)
TRH night cross cover note:   I was notified by RN that the patient was found to be exhibiting evidence of diminished responsiveness, at which time blood sugar was found to be less than 10.  This appears to be new hypoglycemia for him and he is not on any basal or short acting insulin at this time.  Amp of D50 was administered, and the patient's blood sugar improved to 88, but is now trending back down to 39.  Overall, his mental status is improved relative to at time of blood sugar of less than 10.  Will administer another amp of D50 now, with repeat Accu-Chek ordered for 7 AM.  If blood sugar not improved following this additional amp of D50, will consider initiation of continuous D10.  Also ordered stat labs in the form of CMP, CBC and serum magnesium level.  Also ordered every hour CBG monitoring x 3 occurrences for now, and placed order for prn amp of D50 for CBG less than 70.     Mathew Pigg, DO Hospitalist

## 2022-11-15 NOTE — TOC Progression Note (Signed)
Transition of Care [facility]) - Progression Note    Patient Details  Name: Mathew [name] MRN: [id number] Date of Birth: 1950/11/25  Transition of [facility]) CM/SW Contact  Eduard Roux, [place] Phone Number: 11/15/2022, 2:54 PM  Clinical Narrative:     Received call from patient's insurance: spoke with [employer] # [phone] ex [phone] Fax # [phone]- she states if the patient does not transfer to CIR tomorrow she will need updated PT/OT notes faxed to her-  Antony Blackbird, MSW, LCSW Clinical Social Worker      Expected Discharge Plan: Home/Self Care Barriers to Discharge: Continued Medical Work up  Expected Discharge Plan and Services   Discharge Planning Services: CM Consult   Living arrangements for the past 2 months: Single Family Home                                       Social Determinants of Health (SDOH) Interventions SDOH Screenings   Food Insecurity: No Food Insecurity (11/13/2022)  Housing: Low Risk  (11/13/2022)  Transportation Needs: No Transportation Needs (11/13/2022)  Utilities: Not At Risk (11/13/2022)  Social Connections: Unknown (09/14/2021)   Received from [facility]  Tobacco Use: Medium Risk (10/30/2022)    Readmission Risk Interventions     No data to display

## 2022-11-15 NOTE — Significant Event (Signed)
Rapid Response Event Note   Reason for Call :  Hypoglycemia. See night shift notes   Initial Focused Assessment:  He is currently alert and oriented.  He does feel weak and "strange"  He will drift off to sleep frequently.  He describes feeling hot and sweaty earlier this morning and states he still doesn't "feel right"  Lung sounds decreased left base Heart tones regular Active bowel sounds, non-tender abdomen, mildly distend, he states that he feels constipated. Denies any pain  BP 116/82  SR 89  RR 23  O2 sat 100% on 4L [place]  oral temp 97.7   Interventions:  2.5 amp D 50 given prior to my arrival 0719  CBG 119 0802  CBG 48 1 amp D 50 given IV D10 gtt started at 100cc/hr 0824 CBG 113 He is staying awake more consistently  Dr Alvino Chapel at bedside to assess patient. Wife now at bedside, encouraging patient to eat breakfast.  Plan of Care:  CBG q 1 until stable  Event Summary:   MD Notified: Dr Alvino Chapel Call Time: 0700, met night shift on department Arrival Time: 0700 End Time:  0900  Marcellina Millin, RN

## 2022-11-15 NOTE — Progress Notes (Signed)
    Subjective  - POD #16, status post endovascular repair of ruptured thoracic aortic aneurysm  Rapid response was called earlier this morning for the inability to arouse the patient.  His blood sugar was 10.  This was treated.  He is now somewhat confused but answering questions appropriately.   Physical Exam:  Alert and oriented x 3 Lower extremity exam continues to improve.  He can lift both legs off of the bed.  He has palpable pedal pulses bilaterally.  All of his incisions of healed       Assessment/Plan:  POD #16  -He seems to have recovered from his events overnight. -Awaiting CIR placement -Continue with aggressive PT and OT therapy -No new vascular surgical issues  Mathew [name] 11/15/2022 5:38 PM --  Vitals:   11/15/22 1218 11/15/22 1601  BP: 131/75 137/79  Pulse: 84 87  Resp: 19 20  Temp: 97.9 F (36.6 C) 98.2 F (36.8 C)  SpO2: 98% 96%    Intake/Output Summary (Last 24 hours) at 11/15/2022 1738 Last data filed at 11/15/2022 1612 Gross per 24 hour  Intake 240 ml  Output 1175 ml  Net -935 ml     Laboratory CBC    Component Value Date/Time   WBC 14.4 (H) 11/15/2022 0754   HGB 12.5 (L) 11/15/2022 0754   HGB 18.4 (H) 10/23/2022 1417   HCT 38.9 (L) 11/15/2022 0754   PLT 373 11/15/2022 0754   PLT 238 10/23/2022 1417    BMET    Component Value Date/Time   NA 134 (L) 11/15/2022 0754   K 3.4 (L) 11/15/2022 0754   CL 107 11/15/2022 0754   CO2 19 (L) 11/15/2022 0754   GLUCOSE 68 (L) 11/15/2022 0754   BUN 30 (H) 11/15/2022 0754   CREATININE 1.38 (H) 11/15/2022 0754   CREATININE 1.25 (H) 10/23/2022 1417   CALCIUM 8.1 (L) 11/15/2022 0754   GFRNONAA 55 (L) 11/15/2022 0754   GFRNONAA >60 10/23/2022 1417   GFRAA >60 06/12/2018 0815    COAG Lab Results  Component Value Date   INR 1.3 (H) 11/01/2022   INR 1.3 (H) 10/25/2022   INR 1.0 09/02/2022   No results found for: "PTT"  Antibiotics Anti-infectives (From admission, onward)    Start      Dose/Rate Route Frequency Ordered Stop   11/05/22 1400  ceFAZolin (ANCEF) IVPB 1 g/50 mL premix  Status:  Discontinued        1 g 100 mL/hr over 30 Minutes Intravenous Every 8 hours 11/05/22 0742 11/05/22 1839   11/04/22 2200  ceFAZolin (ANCEF) IVPB 1 g/50 mL premix  Status:  Discontinued        1 g 100 mL/hr over 30 Minutes Intravenous Every 8 hours 11/04/22 1116 11/05/22 0742   11/02/22 1015  ceFAZolin (ANCEF) IVPB 1 g/50 mL premix  Status:  Discontinued        1 g 100 mL/hr over 30 Minutes Intravenous Every 12 hours 11/02/22 0922 11/04/22 1116   10/30/22 2200  ceFAZolin (ANCEF) IVPB 2g/100 mL premix        2 g 200 mL/hr over 30 Minutes Intravenous Every 8 hours 10/30/22 2108 10/31/22 0541        V. Charlena Cross, M.D., [facility] [place] Office: [phone] Pager:  [phone]

## 2022-11-15 NOTE — Significant Event (Signed)
Rapid Response Event Note   Reason for Call : Hypoglycemia with unresponsiveness Initial Focused Assessment:  Notified by nursing staff of unresponsive patient. Upon arrival, Mathew [name] was agonally breathing with a pulse, HR 93, sats 96%, CBG <10. 1 amp D50 given immediately. Recheck CBG was 88. Pt was still not fully awake but purposeful and beginning to follow simple commands. An additional 1/2 amp D50 given. Dr. Arlean Hopping was notified and orders received. Pt began to become more somulent and rechecked a CBG at 0629 was 39. Another amp D50 given. Recheck was 94. Pt was able to verbalize and follow commands.  Recheck CBG at shift report 0700 was 61 after sips of coke. An additional amp of D50 was given.   0600-99.17F, HR 99 SR, 160/83, RR 21 with sats 96% on RA.   Interventions:  -Total of  3.5 amps of D50 given since 0550 -New PIV Left FA    MD Notified: Dr. Arlean Hopping Call Time: 0550 Arrival Time: 0555 End Time: 0715  Rose Fillers, RN

## 2022-11-15 NOTE — Progress Notes (Signed)
PROGRESS NOTE    Mathew [name]  ZOX:[id number] DOB: Apr 11, 1951 DOA: 10/30/2022 PCP: Patient, No Pcp Per     Brief Narrative:  Mathew [name] is a 72 year old male with PMHx significant for HCC on chemotherapy, paroxysmal atrial fibrillation, hypertension, and hepatitis C status post treatment.  He initially presented to [facility] Emergency department on 6/21 with complaints of sudden onset chest pain and was diagnosed with thrombosed type B thoracic aortic dissection extending from the distal aortic arch to the level of the diaphragm -initially discharged on the 25th due to no expansion, ultimately returning on the 26th for worsening pain where imaging confirmed aortic rupture requiring endovascular repair by vascular surgery.   Hospital Course: 6/25 Discharged with stable type B Ao dissection with intramural thrombosis  6/26 Returned for admittance with ascending Ao rupture. Emergent OR for repair 6/27 Patient developed left-sided weakness, MRI brain noted for multiple acute infarcts with embolic pattern, MRI C-spine with cord ischemia on the right side.   6/28 Lumbar drain placed 6/30 Started on vasopressor support to maintain SBP greater than 140 7/1 Vascular surgery continue plans to continue lumbar drain  7/2 Nausea with emesis, . Increased O2 requirement with faint bibasilar crackles. CXR + Lasix x 1. Lumbar drain removed. 7/4 Coretrak clogged -ultimately removed as patient is tolerating p.o. 7/5 Continues to progress with PT, currently medically stable for discharge, awaiting safe disposition for SNF versus inpatient rehab 7/8 Discussion with Oncology reveals no real therapy options - palliative care consulted 7/9 Dr. Natale Milch discussed goals of care with patient and wife at bedside, plan for palliative care meeting 7/10 Family meeting with palliative care --> Full scope of care, wants to go to CIR  7/11 Fever 100.4  7/12 Severe hypoglycemic episode CBG <10   New events last 24  hours / Subjective: Early this morning, patient had rapid response called due to unresponsiveness.  His blood sugar was <10.  He received D50 x 4 over the course of 2 hours to maintain his blood glucose.  Eventually, his mentation returned back to baseline.  He was started on IV fluid D10.  On my evaluation, patient was alert and interactive.  He no longer was having the diaphragmatic symptoms.  Blood sugar was 113 while I was in the room.  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)   Normal anion gap metabolic acidosis   Malnutrition of moderate degree   CVA (cerebral vascular accident) (HCC)   Rupture type B aortic dissection status post emergent T EVAR complicated by spinal cord ischemia, embolic stroke -Vascular surgery following  Acute bilateral multifocal embolic/ischemic strokes -MRI Brain showed bilateral multifocal embolic ischemic stroke -Continues to improve with ongoing physical therapy -Continue aspirin, lipitor    Cervical spinal cord edema, concern for infarct Ambulatory dysfunction -MRI C-spine showing spinal cord signal in the C6-C7, edema versus stroke.  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  -On atezolizumab plus bevacizumab every 3 weeks prior to admission.  -Will need outpatient follow-up with oncology. Atezolizumab has been associated with aortic dissection previously per FDA drug information sheet (combined in  the 6% fatal adverse outcomes associated with the drug). Retrospective trials have also shown associations with dissections  associated with bevacizumab, but the FDA has not included this in the drug information sheet to date.   Acute transient metabolic encephalopathy, resolved Acute kidney injury likely ATN in the setting of hemodynamic insults, resolved  Questionable COPD exacerbation, resolved  Prolonged history of tobacco abuse  Goals of care -Appreciate palliative care medicine, continue full scope of care, looking forward to going to CIR  SIRS, not POA -Fever 100.4 overnight 7/10 to 7/11 associated with tachycardia and tachypnea, oxygen desaturation down to 88% requiring 2 L nasal cannula O2 -Blood cultures ordered -Chest x-ray reviewed independently, stable from previous examination with minimal atelectasis, no focal infiltrate -COVID test negative -UA unremarkable -WBC elevated, although has been elevated previously and is not any worse, continue to watch fever curve -Monitor off antibiotics -Afebrile overnight  Severe hypoglycemia with metabolic encephalopathy -Encephalopathy has resolved now that his blood sugar is within acceptable range -Unclear why he had hypoglycemic episode.  He does have diagnosis of prediabetes, is not on insulin in the hospital -Checking labs including cortisol, TSH, free T4, CMP -Continue D10  DVT prophylaxis:  heparin injection 5,000 Units Start: 11/06/22 1400 SCDs Start: 10/30/22 1959  Code Status: Full  Family Communication: Updated wife over the phone Disposition Plan: CIR pending  Status is: Inpatient Remains inpatient appropriate because: CIR pending, monitor fever, blood glucose    Antimicrobials:  Anti-infectives (From admission, onward)    Start     Dose/Rate Route Frequency Ordered Stop   11/05/22 1400  ceFAZolin (ANCEF) IVPB 1 g/50 mL premix  Status:  Discontinued        1 g 100 mL/hr over 30 Minutes Intravenous Every 8 hours 11/05/22 0742 11/05/22 1839   11/04/22 2200  ceFAZolin (ANCEF) IVPB 1 g/50 mL premix  Status:  Discontinued        1  g 100 mL/hr over 30 Minutes Intravenous Every 8 hours 11/04/22 1116 11/05/22 0742   11/02/22 1015  ceFAZolin (ANCEF) IVPB 1 g/50 mL premix  Status:  Discontinued        1 g 100 mL/hr over 30 Minutes Intravenous Every 12 hours 11/02/22 0922 11/04/22 1116   10/30/22 2200  ceFAZolin (ANCEF) IVPB 2g/100 mL premix        2 g 200 mL/hr over 30 Minutes Intravenous Every 8 hours 10/30/22 2108 10/31/22 0541        Objective: Vitals:   11/15/22 0315 11/15/22 0600 11/15/22 0710 11/15/22 1218  BP: (!) 143/81 (!) 160/83 116/82 131/75  Pulse: (!) 105 99 90 84  Resp: 20 (!) 21 (!) 23 19  Temp: 99.1 F (37.3 C)  97.7 F (36.5 C) 97.9 F (36.6 C)  TempSrc: Oral  Oral Oral  SpO2: 96% 96% 100% 98%  Weight:      Height:        Intake/Output Summary (Last 24 hours) at 11/15/2022 1301 Last data filed at 11/15/2022 1218 Gross per 24 hour  Intake 240 ml  Output 975 ml  Net -735 ml   Filed Weights   11/10/22 0425 11/12/22 0605 11/13/22 0340  Weight: 91 kg 90.4 kg 87.8 kg    Examination:  General exam: Appears calm and comfortable  Respiratory system: Clear to auscultation. Respiratory effort normal. No respiratory distress. No conversational dyspnea.  Cardiovascular system: S1 & S2 heard, RRR. No murmurs. No pedal edema. Gastrointestinal system: Abdomen is nondistended, soft and nontender. Normal bowel  sounds heard. Central nervous system: Alert and oriented. No focal neurological deficits. Speech clear.  Extremities: Symmetric in appearance  Skin: No rashes, lesions or ulcers on exposed skin  Psychiatry: Judgement and insight appear normal. Mood & affect appropriate.   Data Reviewed: I have personally reviewed following labs and imaging studies  CBC: Recent Labs  Lab 11/14/22 1033 11/15/22 0754  WBC 15.5* 14.4*  HGB 13.8 12.5*  HCT 43.6 38.9*  MCV 81.3 81.2  PLT 485* 373   Basic Metabolic Panel: Recent Labs  Lab 11/14/22 1033 11/15/22 0754  NA 133* 134*  K 4.1 3.4*  CL  105 107  CO2 21* 19*  GLUCOSE 85 68*  BUN 30* 30*  CREATININE 1.26* 1.38*  CALCIUM 8.1* 8.1*  MG  --  2.2   GFR: Estimated Creatinine Clearance: 59.5 mL/min (A) (by C-G formula based on SCr of 1.38 mg/dL (H)). Liver Function Tests: Recent Labs  Lab 11/15/22 0754  AST 139*  ALT 19  ALKPHOS 136*  BILITOT 1.1  PROT 7.5  ALBUMIN 1.6*   No results for input(s): "LIPASE", "AMYLASE" in the last 168 hours. No results for input(s): "AMMONIA" in the last 168 hours. Coagulation Profile: No results for input(s): "INR", "PROTIME" in the last 168 hours. Cardiac Enzymes: No results for input(s): "CKTOTAL", "CKMB", "CKMBINDEX", "TROPONINI" in the last 168 hours. BNP (last 3 results) No results for input(s): "PROBNP" in the last 8760 hours. HbA1C: No results for input(s): "HGBA1C" in the last 72 hours. CBG: Recent Labs  Lab 11/15/22 0802 11/15/22 0824 11/15/22 0930 11/15/22 1034 11/15/22 1215  GLUCAP 48* 113* 111* 95 100*   Lipid Profile: No results for input(s): "CHOL", "HDL", "LDLCALC", "TRIG", "CHOLHDL", "LDLDIRECT" in the last 72 hours. Thyroid Function Tests: Recent Labs    11/15/22 0754  TSH 3.097  FREET4 1.26*   Anemia Panel: No results for input(s): "VITAMINB12", "FOLATE", "FERRITIN", "TIBC", "IRON", "RETICCTPCT" in the last 72 hours. Sepsis Labs: No results for input(s): "PROCALCITON", "LATICACIDVEN" in the last 168 hours.  Recent Results (from the past 240 hour(s))  SARS Coronavirus 2 by RT PCR (hospital order, performed in [facility] hospital lab) *cepheid single result test* Anterior Nasal Swab     Status: None   Collection Time: 11/14/22  9:30 AM   Specimen: Anterior Nasal Swab  Result Value Ref Range Status   SARS Coronavirus 2 by RT PCR NEGATIVE NEGATIVE Final    Comment: Performed at [facility], [address], [place], [place] [address]  Culture, blood (Routine X 2) w Reflex to ID Panel     Status: None (Preliminary result)   Collection Time:  11/14/22 10:10 AM   Specimen: BLOOD LEFT HAND  Result Value Ref Range Status   Specimen Description BLOOD LEFT HAND  Final   Special Requests   Final    BOTTLES DRAWN AEROBIC ONLY Blood Culture adequate volume   Culture   Final    NO GROWTH < 24 HOURS Performed at [facility], [address], [place], [place] [address]    Report Status PENDING  Incomplete  Culture, blood (Routine X 2) w Reflex to ID Panel     Status: None (Preliminary result)   Collection Time: 11/14/22 10:33 AM   Specimen: BLOOD RIGHT HAND  Result Value Ref Range Status   Specimen Description BLOOD RIGHT HAND  Final   Special Requests   Final    BOTTLES DRAWN AEROBIC ONLY Blood Culture adequate volume   Culture  Final    NO GROWTH < 24 HOURS Performed at [facility], [address] [place], [place] [address]    Report Status PENDING  Incomplete      Radiology Studies: DG Abd 1 View  Result Date: 11/14/2022 CLINICAL DATA:  Loss of appetite for several days, initial encounter EXAM: ABDOMEN - 1 VIEW COMPARISON:  11/05/2022 FINDINGS: Gastric catheter has been removed. Scattered large and small bowel gas is noted. No obstructive changes are seen. No free air is noted. No acute bony abnormality is seen. IMPRESSION: No acute abnormality noted. Electronically Signed   By: Alcide Clever M.D.   On: 11/14/2022 11:43   DG CHEST PORT 1 VIEW  Result Date: 11/14/2022 CLINICAL DATA:  Fever and loss of appetite, initial encounter EXAM: PORTABLE CHEST 1 VIEW COMPARISON:  11/05/2022 FINDINGS: Cardiac shadow is stable. Endovascular stent graft is again seen and stable. Mild atelectatic changes are again noted in the bases. No new focal infiltrate is seen. No bony abnormality is noted. IMPRESSION: Stable basilar atelectasis. Electronically Signed   By: Alcide Clever M.D.   On: 11/14/2022 11:42      Scheduled Meds:  aspirin  81 mg Oral Daily   atorvastatin  80 mg Oral QHS   bacitracin   Topical BID   docusate sodium   100 mg Oral Daily   feeding supplement  1 Container Oral TID BM   heparin injection (subcutaneous)  5,000 Units Subcutaneous Q8H   melatonin  3 mg Oral QHS   pantoprazole  40 mg Oral Daily   polyethylene glycol  17 g Oral Daily   potassium chloride  40 mEq Oral Once   Continuous Infusions:  sodium chloride Stopped (11/06/22 1304)   dextrose 100 mL/hr at 11/15/22 0816   promethazine (PHENERGAN) injection (IM or IVPB) 12.5 mg (11/13/22 1908)     LOS: 16 days   Time spent: 45 minutes   Noralee Stain, DO [facility] 11/15/2022, 1:01 PM   Available via Epic secure chat 7am-7pm After these hours, please refer to coverage provider listed on [email or web address]

## 2022-11-15 NOTE — Progress Notes (Signed)
Ok to replace low k+ with KCL x1 per Dr. Lorenda Peck, PharmD, BCIDP, AAHIVP, CPP Infectious Disease Pharmacist 11/15/2022 9:54 AM

## 2022-11-15 NOTE — Progress Notes (Signed)
Pt initially with q1 hour cbgs and then q2 hour. Pt encouraged to eat more today and started on D10 IVF. CBGs have remained within normal limits for diet and IVF. Pt resting with call bell within reach.  Will continue to monitor. Thomas Hoff, RN

## 2022-11-15 NOTE — Progress Notes (Signed)
Mathew [name]   DOB:[birth year]   ZO#:[id number]   JWJ#:[id number]  Med/onc follow up note   Subjective: Patient had an episode of severe hypoglycemia, he is not on diabetic medicine.  Rapid response was called, patient was unresponsive, resolved after IV glucose.  He has been laying in bed for most of the daytime, he did walk in the hallway with physical therapist yesterday for short distance.  He has dyspnea on exertion.  His appetite is low, no other new symptoms.  Objective:  Vitals:   11/15/22 1601 11/15/22 1941  BP: 137/79 (!) 144/85  Pulse: 87   Resp: 20 20  Temp: 98.2 F (36.8 C) 98.2 F (36.8 C)  SpO2: 96%     Body mass index is 24.2 kg/m.  Intake/Output Summary (Last 24 hours) at 11/15/2022 2109 Last data filed at 11/15/2022 1612 Gross per 24 hour  Intake 120 ml  Output 1175 ml  Net -1055 ml     Sclerae unicteric  Oropharynx clear  No peripheral adenopathy  Lungs clear -- no rales or rhonchi  Heart regular rate and rhythm  Abdomen benign  MSK no focal spinal tenderness, no peripheral edema  Neuro nonfocal    CBG (last 3)  Recent Labs    11/15/22 1411 11/15/22 1604 11/15/22 1850  GLUCAP 112* 175* 148*     Labs:   Urine Studies No results for input(s): "UHGB", "CRYS" in the last 72 hours.  Invalid input(s): "UACOL", "UAPR", "USPG", "UPH", "UTP", "UGL", "UKET", "UBIL", "UNIT", "UROB", "ULEU", "UEPI", "UWBC", "URBC", "UBAC", "CAST", "UCOM", "BILUA"  Basic Metabolic Panel: Recent Labs  Lab 11/14/22 1033 11/15/22 0754  NA 133* 134*  K 4.1 3.4*  CL 105 107  CO2 21* 19*  GLUCOSE 85 68*  BUN 30* 30*  CREATININE 1.26* 1.38*  CALCIUM 8.1* 8.1*  MG  --  2.2   GFR Estimated Creatinine Clearance: 59.5 mL/min (A) (by C-G formula based on SCr of 1.38 mg/dL (H)). Liver Function Tests: Recent Labs  Lab 11/15/22 0754  AST 139*  ALT 19  ALKPHOS 136*  BILITOT 1.1  PROT 7.5  ALBUMIN 1.6*   No results for input(s): "LIPASE", "AMYLASE" in the last 168  hours. No results for input(s): "AMMONIA" in the last 168 hours.  Coagulation profile No results for input(s): "INR", "PROTIME" in the last 168 hours.  CBC: Recent Labs  Lab 11/14/22 1033 11/15/22 0754  WBC 15.5* 14.4*  HGB 13.8 12.5*  HCT 43.6 38.9*  MCV 81.3 81.2  PLT 485* 373   Cardiac Enzymes: No results for input(s): "CKTOTAL", "CKMB", "CKMBINDEX", "TROPONINI" in the last 168 hours. BNP: Invalid input(s): "POCBNP" CBG: Recent Labs  Lab 11/15/22 1215 11/15/22 1329 11/15/22 1411 11/15/22 1604 11/15/22 1850  GLUCAP 100* 123* 112* 175* 148*   D-Dimer No results for input(s): "DDIMER" in the last 72 hours. Hgb A1c No results for input(s): "HGBA1C" in the last 72 hours. Lipid Profile No results for input(s): "CHOL", "HDL", "LDLCALC", "TRIG", "CHOLHDL", "LDLDIRECT" in the last 72 hours. Thyroid function studies Recent Labs    11/15/22 0754  TSH 3.097   Anemia work up No results for input(s): "VITAMINB12", "FOLATE", "FERRITIN", "TIBC", "IRON", "RETICCTPCT" in the last 72 hours. Microbiology Recent Results (from the past 240 hour(s))  SARS Coronavirus 2 by RT PCR (hospital order, performed in [facility] hospital lab) *cepheid single result test* Anterior Nasal Swab     Status: None   Collection Time: 11/14/22  9:30 AM   Specimen: Anterior Nasal Swab  Result Value Ref Range Status   SARS Coronavirus 2 by RT PCR NEGATIVE NEGATIVE Final    Comment: Performed at [facility], [address] [place], [place] [address]  Culture, blood (Routine X 2) w Reflex to ID Panel     Status: None (Preliminary result)   Collection Time: 11/14/22 10:10 AM   Specimen: BLOOD LEFT HAND  Result Value Ref Range Status   Specimen Description BLOOD LEFT HAND  Final   Special Requests   Final    BOTTLES DRAWN AEROBIC ONLY Blood Culture adequate volume   Culture   Final    NO GROWTH < 24 HOURS Performed at [facility], [address], [place], [place] [address]     Report Status PENDING  Incomplete  Culture, blood (Routine X 2) w Reflex to ID Panel     Status: None (Preliminary result)   Collection Time: 11/14/22 10:33 AM   Specimen: BLOOD RIGHT HAND  Result Value Ref Range Status   Specimen Description BLOOD RIGHT HAND  Final   Special Requests   Final    BOTTLES DRAWN AEROBIC ONLY Blood Culture adequate volume   Culture   Final    NO GROWTH < 24 HOURS Performed at [facility], [address], [place], [place] [address]    Report Status PENDING  Incomplete      Studies:  DG Abd 1 View  Result Date: 11/14/2022 CLINICAL DATA:  Loss of appetite for several days, initial encounter EXAM: ABDOMEN - 1 VIEW COMPARISON:  11/05/2022 FINDINGS: Gastric catheter has been removed. Scattered large and small bowel gas is noted. No obstructive changes are seen. No free air is noted. No acute bony abnormality is seen. IMPRESSION: No acute abnormality noted. Electronically Signed   By: Alcide Clever M.D.   On: 11/14/2022 11:43   DG CHEST PORT 1 VIEW  Result Date: 11/14/2022 CLINICAL DATA:  Fever and loss of appetite, initial encounter EXAM: PORTABLE CHEST 1 VIEW COMPARISON:  11/05/2022 FINDINGS: Cardiac shadow is stable. Endovascular stent graft is again seen and stable. Mild atelectatic changes are again noted in the bases. No new focal infiltrate is seen. No bony abnormality is noted. IMPRESSION: Stable basilar atelectasis. Electronically Signed   By: Alcide Clever M.D.   On: 11/14/2022 11:42    Assessment: 72 y.o. 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  He  previously received immunotherapy and bevacizumab, had limited response.  Bevacizumab or TKI will be contraindicated due to his recent serious vascular events.  I do not think he will respond well to immunotherapy alone, even if I was able to give him down the road.  Given his overall very poor performance status, I am not sure if he is able to participate inpatient rehab, but I think it is reasonable to try some sort of rehabilitation if he can participate, because his goal is to regain some independence and go home.  -We again discussed his overall poor prognosis, his life expectancy is likely a few months.  I recommend home hospice after he completes rehab.  His  wife has retired, and he does not have some support at home.  I discussed the logistics and the benefit of hospice, he seems to be open to it. -We also discussed CODE STATUS, after lengthy discussion, he agreed to DNR -his wife is very supportive, will agree with whatever he wishes -I hope palliative care team will continue seeing him for his symptom management and transition to home hospice upon discharge. I spent total of 50 minutes for his visit today. We had an open and good conversation, he talked about his kids, grandchildren and his wishes to see his grandchildren and spend some time at home.   Malachy Mood, MD 11/15/2022

## 2022-11-16 DIAGNOSIS — I71019 Dissection of thoracic aorta, unspecified: Secondary | ICD-10-CM | POA: Diagnosis not present

## 2022-11-16 LAB — COMPREHENSIVE METABOLIC PANEL
ALT: 16 U/L (ref 0–44)
AST: 121 U/L — ABNORMAL HIGH (ref 15–41)
Albumin: <1.5 g/dL — ABNORMAL LOW (ref 3.5–5.0)
Alkaline Phosphatase: 141 U/L — ABNORMAL HIGH (ref 38–126)
Anion gap: 6 (ref 5–15)
BUN: 25 mg/dL — ABNORMAL HIGH (ref 8–23)
CO2: 20 mmol/L — ABNORMAL LOW (ref 22–32)
Calcium: 7.6 mg/dL — ABNORMAL LOW (ref 8.9–10.3)
Chloride: 100 mmol/L (ref 98–111)
Creatinine, Ser: 1.19 mg/dL (ref 0.61–1.24)
GFR, Estimated: >60 mL/min (ref >60)
Glucose, Bld: 141 mg/dL — ABNORMAL HIGH (ref 70–99)
Potassium: 3.5 mmol/L (ref 3.5–5.1)
Sodium: 126 mmol/L — ABNORMAL LOW (ref 135–145)
Total Bilirubin: 1 mg/dL (ref 0.3–1.2)
Total Protein: 7.2 g/dL (ref 6.5–8.1)

## 2022-11-16 LAB — CBC
HCT: 37.2 % — ABNORMAL LOW (ref 39.0–52.0)
Hemoglobin: 12.1 g/dL — ABNORMAL LOW (ref 13.0–17.0)
MCH: 26 pg (ref 26.0–34.0)
MCHC: 32.5 g/dL (ref 30.0–36.0)
MCV: 79.8 fL — ABNORMAL LOW (ref 80.0–100.0)
Platelets: 427 10*3/uL — ABNORMAL HIGH (ref 150–400)
RBC: 4.66 MIL/uL (ref 4.22–5.81)
RDW: 19.7 % — ABNORMAL HIGH (ref 11.5–15.5)
WBC: 15.8 10*3/uL — ABNORMAL HIGH (ref 4.0–10.5)
nRBC: 0.4 % — ABNORMAL HIGH (ref 0.0–0.2)

## 2022-11-16 LAB — GLUCOSE, CAPILLARY
Glucose-Capillary: 148 mg/dL — ABNORMAL HIGH (ref 70–99)
Glucose-Capillary: 134 mg/dL — ABNORMAL HIGH (ref 70–99)
Glucose-Capillary: 142 mg/dL — ABNORMAL HIGH (ref 70–99)
Glucose-Capillary: 126 mg/dL — ABNORMAL HIGH (ref 70–99)
Glucose-Capillary: 116 mg/dL — ABNORMAL HIGH (ref 70–99)
Glucose-Capillary: 117 mg/dL — ABNORMAL HIGH (ref 70–99)
Glucose-Capillary: 100 mg/dL — ABNORMAL HIGH (ref 70–99)
Glucose-Capillary: 94 mg/dL (ref 70–99)

## 2022-11-16 MED ORDER — POTASSIUM CHLORIDE CRYS ER 20 MEQ PO TBCR
40.0000 meq | EXTENDED_RELEASE_TABLET | Freq: Once | ORAL | Status: AC
  Administered 2022-11-16: 40 meq via ORAL
  Filled 2022-11-16: qty 2

## 2022-11-16 MED ORDER — DEXTROSE-SODIUM CHLORIDE 5-0.45 % IV SOLN: INTRAVENOUS | Status: DC

## 2022-11-16 MED ORDER — ONDANSETRON HCL 4 MG/2ML IJ SOLN
4.0000 mg | Freq: Three times a day (TID) | INTRAMUSCULAR | Status: DC
  Administered 2022-11-16 – 2022-11-18 (×8): 4 mg via INTRAVENOUS
  Filled 2022-11-16 (×9): qty 2

## 2022-11-16 NOTE — Progress Notes (Addendum)
[facility]  Subjective  - Slowly getting better   Objective (!) 149/90 91 98.2 F (36.8 C) (Oral) 19 96%  Intake/Output Summary (Last 24 hours) at 11/16/2022 0913 Last data filed at 11/16/2022 0545 Gross per 24 hour  Intake --  Output 775 ml  Net -775 ml    Left leg weakness  Palpable pedal pulses B Incision all healing well Lungs non labored breathing  Assessment/Planning:  Indication for surgical intervention:  This is a 72 year old gentleman who presented to the hospital several days ago with abdominal pain and chest pain.  He was found to have a type B aortic dissection/intramural hematoma.  He was managed medically with blood pressure control and pain management.  Follow-up CT scan showed no progression of his dissection so he was sent home.  Earlier today he developed recurrent chest pain and came to the emergency department.  CT scan of the chest showed acute aortic rupture.  I discussed going emergently to the operating room for repair.   POD #17 Endovascular repair of ruptured thoracic aortic aneurysm with coverage of left subclavian artery  Stent, left subclavian artery Laser fenestration, left subclavian artery     No new issues, Palpable pedal pulses with improving mobility pending rehab  Mosetta Pigeon Endovascular repair of ruptured thoracic aortic aneurysm with coverage of left subclavian artery  9:13 AM --  VASCULAR STAFF ADDENDUM: I have independently interviewed and examined the patient. I agree with the above.  Palpable pedal pulses and left radial artery   Fara Olden, MD [facility] Phone Number: [phone] 11/16/2022 9:21 AM    Laboratory Lab Results: Recent Labs    11/15/22 0754 11/16/22 0123  WBC 14.4* 15.8*  HGB 12.5* 12.1*  HCT 38.9* 37.2*  PLT 373 427*   BMET Recent Labs    11/15/22 0754 11/16/22 0123  NA 134* 126*  K 3.4* 3.5  CL 107  100  CO2 19* 20*  GLUCOSE 68* 141*  BUN 30* 25*  CREATININE 1.38* 1.19  CALCIUM 8.1* 7.6*    COAG Lab Results  Component Value Date   INR 1.3 (H) 11/01/2022   INR 1.3 (H) 10/25/2022   INR 1.0 09/02/2022   No results found for: "PTT"

## 2022-11-16 NOTE — Progress Notes (Addendum)
PROGRESS NOTE    Mathew [name]  GNF:[id number] DOB: 1951-04-23 DOA: 10/30/2022 PCP: Patient, No Pcp Per     Brief Narrative:  Mathew [name] is a 72 year old male with PMHx significant for HCC on chemotherapy, paroxysmal atrial fibrillation, hypertension, and hepatitis C status post treatment.  He initially presented to [facility] Emergency department on 6/21 with complaints of sudden onset chest pain and was diagnosed with thrombosed type B thoracic aortic dissection extending from the distal aortic arch to the level of the diaphragm -initially discharged on the 25th due to no expansion, ultimately returning on the 26th for worsening pain where imaging confirmed aortic rupture requiring endovascular repair by vascular surgery.   Hospital Course: 6/25 Discharged with stable type B Ao dissection with intramural thrombosis  6/26 Returned for admittance with ascending Ao rupture. Emergent OR for repair 6/27 Patient developed left-sided weakness, MRI brain noted for multiple acute infarcts with embolic pattern, MRI C-spine with cord ischemia on the right side.   6/28 Lumbar drain placed 6/30 Started on vasopressor support to maintain SBP greater than 140 7/1 Vascular surgery continue plans to continue lumbar drain  7/2 Nausea with emesis, . Increased O2 requirement with faint bibasilar crackles. CXR + Lasix x 1. Lumbar drain removed. 7/4 Coretrak clogged -ultimately removed as patient is tolerating p.o. 7/5 Continues to progress with PT 7/8 Discussion with Oncology reveals no real therapy options - palliative care consulted 7/9 Dr. Natale Milch discussed goals of care with patient and wife at bedside, plan for palliative care meeting 7/10 Family meeting with palliative care --> Full scope of care, wants to go to CIR  7/11 Fever 100.4  7/12 Severe hypoglycemic episode CBG <10  7/13 Agreeable to DNR, treat the treatable, wants to go to CIR, then home with hospice   New events last 24 hours /  Subjective: Patient's blood sugar has been better.  Complains of nausea without vomiting.  Poor appetite and oral intake.  Had a long discussion with patient and wife at bedside, discussed Dr. Latanya Maudlin assessment from last night.  They are in agreement with DNR, knowing his poor prognosis and that he is not a candidate for any cancer treatments.  Their goal is to go to CIR, continue rehab efforts, then go home with home hospice after discharge.  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)   Normal anion gap metabolic acidosis   Malnutrition of moderate degree   CVA (cerebral vascular accident) (HCC)   Rupture type B aortic dissection status post emergent T EVAR complicated by spinal cord ischemia, embolic stroke -Vascular surgery following  Acute bilateral multifocal embolic/ischemic strokes -MRI Brain showed bilateral multifocal embolic ischemic stroke -Continues to improve with ongoing physical therapy -Continue aspirin, lipitor    Cervical spinal cord edema, concern for infarct Ambulatory dysfunction -MRI C-spine showing spinal cord signal in the C6-C7, edema versus stroke.  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  -On atezolizumab plus bevacizumab every 3 weeks prior to admission.  -Will need outpatient follow-up with oncology. Atezolizumab has been associated with aortic dissection previously per FDA drug information sheet (combined in the 6% fatal  adverse outcomes associated with the drug). Retrospective trials have also shown associations with dissections associated with  bevacizumab, but the FDA has not included this in the drug information sheet to date.  -Per Dr. Mosetta Putt, patient is unlikely to be a candidate for more cancer treatments with his stroke and aortic dissection.  Overall prognosis is poor, likely 3 to 6 months  Acute transient metabolic encephalopathy, resolved Acute kidney injury likely ATN in the setting of hemodynamic insults, resolved  Questionable COPD exacerbation, resolved  Prolonged history of tobacco abuse  Goals of care -Appreciate palliative care medicine  SIRS, not POA -Fever 100.4 overnight 7/10 to 7/11 associated with tachycardia and tachypnea, oxygen desaturation down to 88% requiring 2 L nasal cannula O2 -Blood cultures ordered -Chest x-ray reviewed independently, stable from previous examination with minimal atelectasis, no focal infiltrate -COVID test negative -UA unremarkable -WBC elevated, although has been elevated previously and is not any worse, continue to watch fever curve -Monitor off antibiotics -Afebrile 48 hours   Severe hypoglycemia with metabolic encephalopathy -Encephalopathy has resolved now that his blood sugar is within acceptable range -Unclear why he had hypoglycemic episode.  He does have diagnosis of prediabetes, is not on insulin in the hospital -Continue D5-0.45NS   Non-severe malnutrition -Albumin < 1.5 -Appreciate dietitian   DVT prophylaxis:  heparin injection 5,000 Units Start: 11/06/22 1400 SCDs Start: 10/30/22 1959  Code Status: DNR   Family Communication: Wife at bedside Disposition Plan: CIR pending  Status is: Inpatient Remains inpatient appropriate because: CIR pending, awaiting insurance authorization   Antimicrobials:  Anti-infectives (From admission, onward)    Start     Dose/Rate Route Frequency Ordered Stop   11/05/22 1400  ceFAZolin (ANCEF) IVPB 1 g/50 mL premix  Status:  Discontinued        1 g 100 mL/hr over 30 Minutes Intravenous Every 8 hours 11/05/22 0742 11/05/22  1839   11/04/22 2200  ceFAZolin (ANCEF) IVPB 1 g/50 mL premix  Status:  Discontinued        1 g 100 mL/hr over 30 Minutes Intravenous Every 8 hours 11/04/22 1116 11/05/22 0742   11/02/22 1015  ceFAZolin (ANCEF) IVPB 1 g/50 mL premix  Status:  Discontinued        1 g 100 mL/hr over 30 Minutes Intravenous Every 12 hours 11/02/22 0922 11/04/22 1116   10/30/22 2200  ceFAZolin (ANCEF) IVPB 2g/100 mL premix        2 g 200 mL/hr over 30 Minutes Intravenous Every 8 hours 10/30/22 2108 10/31/22 0541        Objective: Vitals:   11/15/22 1941 11/15/22 2300 11/16/22 0303 11/16/22 0758  BP: (!) 144/85 (!) 155/85 (!) 144/86 (!) 149/90  Pulse:  89 92 91  Resp: 20 19 15 19   Temp: 98.2 F (36.8 C) 97.7 F (36.5 C) 98.2 F (36.8 C) 98.2 F (36.8 C)  TempSrc: Oral Oral Oral Oral  SpO2:  95% 94% 96%  Weight:      Height:        Intake/Output Summary (Last 24 hours) at 11/16/2022 1100 Last data filed at 11/16/2022 1004 Gross per 24 hour  Intake 200 ml  Output 775 ml  Net -575 ml   Filed Weights   11/10/22 0425 11/12/22 0605 11/13/22 0340  Weight: 91 kg 90.4 kg 87.8 kg    Examination:  General exam: Appears calm and comfortable  Respiratory system: Clear to auscultation. Respiratory effort normal. No respiratory distress. No conversational dyspnea.  Cardiovascular system: S1 &  S2 heard, RRR. No murmurs. No pedal edema. Gastrointestinal system: Abdomen is nondistended, soft and nontender. Normal bowel sounds heard. Central nervous system: Alert and oriented. No focal neurological deficits. Speech clear.  Extremities: Symmetric in appearance  Skin: No rashes, lesions or ulcers on exposed skin  Psychiatry: Judgement and insight appear normal. Mood & affect appropriate.   Data Reviewed: I have personally reviewed following labs and imaging studies  CBC: Recent Labs  Lab 11/14/22 1033 11/15/22 0754 11/16/22 0123  WBC 15.5* 14.4* 15.8*  HGB 13.8 12.5* 12.1*  HCT 43.6 38.9* 37.2*   MCV 81.3 81.2 79.8*  PLT 485* 373 427*   Basic Metabolic Panel: Recent Labs  Lab 11/14/22 1033 11/15/22 0754 11/16/22 0123  NA 133* 134* 126*  K 4.1 3.4* 3.5  CL 105 107 100  CO2 21* 19* 20*  GLUCOSE 85 68* 141*  BUN 30* 30* 25*  CREATININE 1.26* 1.38* 1.19  CALCIUM 8.1* 8.1* 7.6*  MG  --  2.2  --    GFR: Estimated Creatinine Clearance: 69 mL/min (by C-G formula based on SCr of 1.19 mg/dL). Liver Function Tests: Recent Labs  Lab 11/15/22 0754 11/16/22 0123  AST 139* 121*  ALT 19 16  ALKPHOS 136* 141*  BILITOT 1.1 1.0  PROT 7.5 7.2  ALBUMIN 1.6* <1.5*   No results for input(s): "LIPASE", "AMYLASE" in the last 168 hours. No results for input(s): "AMMONIA" in the last 168 hours. Coagulation Profile: No results for input(s): "INR", "PROTIME" in the last 168 hours. Cardiac Enzymes: No results for input(s): "CKTOTAL", "CKMB", "CKMBINDEX", "TROPONINI" in the last 168 hours. BNP (last 3 results) No results for input(s): "PROBNP" in the last 8760 hours. HbA1C: No results for input(s): "HGBA1C" in the last 72 hours. CBG: Recent Labs  Lab 11/15/22 2302 11/16/22 0110 11/16/22 0301 11/16/22 0504 11/16/22 0658  GLUCAP 188* 148* 134* 142* 126*   Lipid Profile: No results for input(s): "CHOL", "HDL", "LDLCALC", "TRIG", "CHOLHDL", "LDLDIRECT" in the last 72 hours. Thyroid Function Tests: Recent Labs    11/15/22 0754  TSH 3.097  FREET4 1.26*   Anemia Panel: No results for input(s): "VITAMINB12", "FOLATE", "FERRITIN", "TIBC", "IRON", "RETICCTPCT" in the last 72 hours. Sepsis Labs: No results for input(s): "PROCALCITON", "LATICACIDVEN" in the last 168 hours.  Recent Results (from the past 240 hour(s))  SARS Coronavirus 2 by RT PCR (hospital order, performed in [facility] hospital lab) *cepheid single result test* Anterior Nasal Swab     Status: None   Collection Time: 11/14/22  9:30 AM   Specimen: Anterior Nasal Swab  Result Value Ref Range Status   SARS  Coronavirus 2 by RT PCR NEGATIVE NEGATIVE Final    Comment: Performed at [facility], [address], [place], [place] [address]  Culture, blood (Routine X 2) w Reflex to ID Panel     Status: None (Preliminary result)   Collection Time: 11/14/22 10:10 AM   Specimen: BLOOD LEFT HAND  Result Value Ref Range Status   Specimen Description BLOOD LEFT HAND  Final   Special Requests   Final    BOTTLES DRAWN AEROBIC ONLY Blood Culture adequate volume   Culture   Final    NO GROWTH 2 DAYS Performed at [facility], [address] [place], [place] [address]    Report Status PENDING  Incomplete  Culture, blood (Routine X 2) w Reflex to ID Panel     Status: None (Preliminary result)   Collection Time: 11/14/22 10:33 AM  Specimen: BLOOD RIGHT HAND  Result Value Ref Range Status   Specimen Description BLOOD RIGHT HAND  Final   Special Requests   Final    BOTTLES DRAWN AEROBIC ONLY Blood Culture adequate volume   Culture   Final    NO GROWTH 2 DAYS Performed at [facility], [address] [place], [place] [address]    Report Status PENDING  Incomplete      Radiology Studies: No results found.    Scheduled Meds:  aspirin  81 mg Oral Daily   atorvastatin  80 mg Oral QHS   bacitracin   Topical BID   docusate sodium  100 mg Oral Daily   feeding supplement  1 Container Oral TID BM   heparin injection (subcutaneous)  5,000 Units Subcutaneous Q8H   melatonin  3 mg Oral QHS   ondansetron (ZOFRAN) IV  4 mg Intravenous Q8H   pantoprazole  40 mg Oral Daily   polyethylene glycol  17 g Oral Daily   Continuous Infusions:  sodium chloride Stopped (11/06/22 1304)   dextrose 5 % and 0.45 % NaCl 50 mL/hr at 11/16/22 1004   promethazine (PHENERGAN) injection (IM or IVPB) 12.5 mg (11/13/22 1908)     LOS: 17 days   Time spent: 45 minutes   Noralee Stain, DO [facility] 11/16/2022, 11:00 AM   Available via Epic secure chat 7am-7pm After these hours, please refer  to coverage provider listed on [email or web address]

## 2022-11-16 NOTE — Progress Notes (Signed)
Mobility Specialist: Progress Note   11/16/22 1406  Mobility  Activity Ambulated with assistance in room  Level of Assistance Moderate assist, patient does 50-74%  Assistive Device Front wheel walker  Distance Ambulated (ft) 10 ft  LLE Weight Bearing WBAT  Activity Response Tolerated poorly  Mobility Referral Yes  $Mobility charge 1 Mobility  Mobility Specialist Start Time (ACUTE ONLY) 1320  Mobility Specialist Stop Time (ACUTE ONLY) 1359  Mobility Specialist Time Calculation (min) (ACUTE ONLY) 39 min   Post-Mobility: 97 HR  Pt received in the bed and agreeable to mobility. Mod I with bed mobility and modA to stand. Pt assisted to [facility] per request upon standing and agreeable to ambulation once finished. Pt able to ambulate to the door but distance limited secondary to N&V. Wheeled back into the room with call bell at his side. Chair alarm is on. RN aware of N&V.   Mitsue Peery Mobility Specialist Please contact via SecureChat or Rehab office at [phone]

## 2022-11-16 NOTE — Plan of Care (Signed)
  Problem: Education: Goal: Knowledge of General Education information will improve Description: Including pain rating scale, medication(s)/side effects and non-pharmacologic comfort measures Outcome: Progressing   Problem: Health Behavior/Discharge Planning: Goal: Ability to manage health-related needs will improve Outcome: Progressing   Problem: Clinical Measurements: Goal: Ability to maintain clinical measurements within normal limits will improve Outcome: Progressing Goal: Will remain free from infection Outcome: Progressing Goal: Respiratory complications will improve Outcome: Progressing Goal: Cardiovascular complication will be avoided Outcome: Progressing   Problem: Activity: Goal: Risk for activity intolerance will decrease Outcome: Progressing   Problem: Education: Goal: Knowledge of patient specific risk factors will improve Loraine Leriche N/A or DELETE if not current risk factor) Outcome: Progressing   Problem: Ischemic Stroke/TIA Tissue Perfusion: Goal: Complications of ischemic stroke/TIA will be minimized Outcome: Progressing   Problem: Coping: Goal: Will verbalize positive feelings about self Outcome: Progressing Goal: Will identify appropriate support needs Outcome: Progressing   Problem: Education: Goal: Knowledge of General Education information will improve Description: Including pain rating scale, medication(s)/side effects and non-pharmacologic comfort measures Outcome: Progressing   Problem: Health Behavior/Discharge Planning: Goal: Ability to manage health-related needs will improve Outcome: Progressing   Problem: Clinical Measurements: Goal: Ability to maintain clinical measurements within normal limits will improve Outcome: Progressing Goal: Will remain free from infection Outcome: Progressing Goal: Respiratory complications will improve Outcome: Progressing Goal: Cardiovascular complication will be avoided Outcome: Progressing   Problem:  Activity: Goal: Risk for activity intolerance will decrease Outcome: Progressing   Problem: Education: Goal: Knowledge of patient specific risk factors will improve Loraine Leriche N/A or DELETE if not current risk factor) Outcome: Progressing   Problem: Ischemic Stroke/TIA Tissue Perfusion: Goal: Complications of ischemic stroke/TIA will be minimized Outcome: Progressing   Problem: Coping: Goal: Will verbalize positive feelings about self Outcome: Progressing Goal: Will identify appropriate support needs Outcome: Progressing

## 2022-11-17 DIAGNOSIS — I71019 Dissection of thoracic aorta, unspecified: Secondary | ICD-10-CM | POA: Diagnosis not present

## 2022-11-17 LAB — CBC
HCT: 39.2 % (ref 39.0–52.0)
Hemoglobin: 12.8 g/dL — ABNORMAL LOW (ref 13.0–17.0)
MCH: 25.6 pg — ABNORMAL LOW (ref 26.0–34.0)
MCHC: 32.7 g/dL (ref 30.0–36.0)
MCV: 78.4 fL — ABNORMAL LOW (ref 80.0–100.0)
Platelets: 433 10*3/uL — ABNORMAL HIGH (ref 150–400)
RBC: 5 MIL/uL (ref 4.22–5.81)
RDW: 20.1 % — ABNORMAL HIGH (ref 11.5–15.5)
WBC: 14 10*3/uL — ABNORMAL HIGH (ref 4.0–10.5)
nRBC: 0.6 % — ABNORMAL HIGH (ref 0.0–0.2)

## 2022-11-17 LAB — COMPREHENSIVE METABOLIC PANEL
ALT: 19 U/L (ref 0–44)
AST: 129 U/L — ABNORMAL HIGH (ref 15–41)
Albumin: 1.6 g/dL — ABNORMAL LOW (ref 3.5–5.0)
Alkaline Phosphatase: 136 U/L — ABNORMAL HIGH (ref 38–126)
Anion gap: 11 (ref 5–15)
BUN: 23 mg/dL (ref 8–23)
CO2: 18 mmol/L — ABNORMAL LOW (ref 22–32)
Calcium: 7.9 mg/dL — ABNORMAL LOW (ref 8.9–10.3)
Chloride: 99 mmol/L (ref 98–111)
Creatinine, Ser: 1.18 mg/dL (ref 0.61–1.24)
GFR, Estimated: >60 mL/min (ref >60)
Glucose, Bld: 96 mg/dL (ref 70–99)
Potassium: 5 mmol/L (ref 3.5–5.1)
Sodium: 128 mmol/L — ABNORMAL LOW (ref 135–145)
Total Bilirubin: 0.9 mg/dL (ref 0.3–1.2)
Total Protein: 7.5 g/dL (ref 6.5–8.1)

## 2022-11-17 LAB — GLUCOSE, CAPILLARY
Glucose-Capillary: 107 mg/dL — ABNORMAL HIGH (ref 70–99)
Glucose-Capillary: 103 mg/dL — ABNORMAL HIGH (ref 70–99)
Glucose-Capillary: 106 mg/dL — ABNORMAL HIGH (ref 70–99)
Glucose-Capillary: 118 mg/dL — ABNORMAL HIGH (ref 70–99)

## 2022-11-17 LAB — MAGNESIUM: Magnesium: 1.8 mg/dL (ref 1.7–2.4)

## 2022-11-17 MED ORDER — METOCLOPRAMIDE HCL 5 MG/ML IJ SOLN
5.0000 mg | Freq: Two times a day (BID) | INTRAMUSCULAR | Status: DC
  Administered 2022-11-17 – 2022-11-18 (×4): 5 mg via INTRAVENOUS
  Filled 2022-11-17 (×4): qty 2

## 2022-11-17 NOTE — Progress Notes (Addendum)
[facility]  Subjective  - Vomiting with gagging a lot.  He is unable to tolerate PO's without gagging.   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  -Per Dr. Mosetta Putt, patient is unlikely to be a candidate for more cancer treatments with his stroke and aortic dissection.  Overall prognosis is poor, likely 3 to 6 months     Mathew [name] 11/17/2022 7:14 AM --  VASCULAR STAFF ADDENDUM: I agree with the above.  Pending CIR  J. Gillis Santa, MD [facility] Phone Number: [phone] 11/17/2022 9:33 AM    Laboratory Lab Results: Recent Labs    11/16/22 0123 11/17/22 0120  WBC 15.8* 14.0*  HGB 12.1* 12.8*  HCT 37.2* 39.2  PLT 427* 433*   BMET Recent Labs    11/16/22 0123 11/17/22 0120  NA 126* 128*  K 3.5 5.0  CL 100 99  CO2 20* 18*  GLUCOSE 141* 96  BUN 25* 23  CREATININE 1.19 1.18  CALCIUM 7.6* 7.9*    COAG Lab Results  Component Value Date   INR 1.3 (H) 11/01/2022   INR 1.3 (H) 10/25/2022   INR 1.0 09/02/2022   No results found for: "PTT"

## 2022-11-17 NOTE — Progress Notes (Signed)
PROGRESS NOTE    Mathew [name]  WUJ:[id number] DOB: 1950-09-17 DOA: 10/30/2022 PCP: Patient, No Pcp Per     Brief Narrative:  Mathew [name] is a 72 year old male with PMHx significant for HCC on chemotherapy, paroxysmal atrial fibrillation, hypertension, and hepatitis C status post treatment.  He initially presented to [facility] Emergency department on 6/21 with complaints of sudden onset chest pain and was diagnosed with thrombosed type B thoracic aortic dissection extending from the distal aortic arch to the level of the diaphragm -initially discharged on the 25th due to no expansion, ultimately returning on the 26th for worsening pain where imaging confirmed aortic rupture requiring endovascular repair by vascular surgery.   Hospital Course: 6/25 Discharged with stable type B Ao dissection with intramural thrombosis  6/26 Returned for admittance with ascending Ao rupture. Emergent OR for repair 6/27 Patient developed left-sided weakness, MRI brain noted for multiple acute infarcts with embolic pattern, MRI C-spine with cord ischemia on the right side.   6/28 Lumbar drain placed 6/30 Started on vasopressor support to maintain SBP greater than 140 7/1 Vascular surgery continue plans to continue lumbar drain  7/2 Nausea with emesis, . Increased O2 requirement with faint bibasilar crackles. CXR + Lasix x 1. Lumbar drain removed. 7/4 Coretrak clogged -ultimately removed as patient is tolerating p.o. 7/5 Continues to progress with PT 7/8 Discussion with Oncology reveals no real therapy options - palliative care consulted 7/9 Dr. Natale Milch discussed goals of care with patient and wife at bedside, plan for palliative care meeting 7/10 Family meeting with palliative care --> Full scope of care, wants to go to CIR  7/11 Fever 100.4  7/12 Severe hypoglycemic episode CBG <10  7/13 Agreeable to DNR, treat the treatable, wants to go to CIR, then home with hospice   New events last 24 hours /  Subjective: Having significant nausea and vomiting.  Has not been able to eat much.  Wife at bedside requesting to stop sleep medicines overnight.  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)   Normal anion gap metabolic acidosis   Malnutrition of moderate degree   CVA (cerebral vascular accident) (HCC)   Rupture type B aortic dissection status post emergent T EVAR complicated by spinal cord ischemia, embolic stroke -Vascular surgery following  Acute bilateral multifocal embolic/ischemic strokes -MRI Brain showed bilateral multifocal embolic ischemic stroke -Continues to improve with ongoing physical therapy -Continue aspirin, lipitor    Cervical spinal cord edema, concern for infarct Ambulatory dysfunction -MRI C-spine showing spinal cord signal in the C6-C7, edema versus stroke.  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  -On atezolizumab plus bevacizumab every 3 weeks prior to admission.  -Will need outpatient follow-up with oncology. Atezolizumab has been associated with aortic dissection previously per FDA drug information sheet (combined in the 6% fatal adverse outcomes associated with the drug). Retrospective trials have also shown associations with dissections associated with bevacizumab, but the FDA has not included this in the drug information sheet to date.  -Per Dr. Mosetta Putt, patient is unlikely to be a candidate for more  cancer treatments with his stroke and aortic dissection.  Overall prognosis is poor, likely 3 to 6  months  Acute transient metabolic encephalopathy, resolved Acute kidney injury likely ATN in the setting of hemodynamic insults, resolved  Questionable COPD exacerbation, resolved  Prolonged history of tobacco abuse  Goals of care -Appreciate palliative care medicine  SIRS, not POA -Fever 100.4 overnight 7/10 to 7/11 associated with tachycardia and tachypnea, oxygen desaturation down to 88% requiring 2 L nasal cannula O2 -Blood cultures ordered -Chest x-ray reviewed independently, stable from previous examination with minimal atelectasis, no focal infiltrate -COVID test negative -UA unremarkable -WBC elevated, although has been elevated previously and is not any worse, continue to watch fever curve -Monitor off antibiotics -Afebrile >48 hours   Severe hypoglycemia with metabolic encephalopathy -Encephalopathy has resolved now that his blood sugar is within acceptable range -Unclear why he had hypoglycemic episode.  He does have diagnosis of prediabetes, is not on insulin in the hospital -Continue D5-0.45NS   Non-severe malnutrition -Albumin < 1.5 -Appreciate dietitian   Nausea and vomiting -Scheduled Zofran and Reglan for symptom management  DVT prophylaxis:  heparin injection 5,000 Units Start: 11/06/22 1400 SCDs Start: 10/30/22 1959  Code Status: DNR   Family Communication: Wife at bedside Disposition Plan: CIR pending  Status is: Inpatient Remains inpatient appropriate because: CIR pending, awaiting insurance authorization   Antimicrobials:  Anti-infectives (From admission, onward)    Start     Dose/Rate Route Frequency Ordered Stop   11/05/22 1400  ceFAZolin (ANCEF) IVPB 1 g/50 mL premix  Status:  Discontinued        1 g 100 mL/hr over 30 Minutes Intravenous Every 8 hours 11/05/22 0742 11/05/22 1839   11/04/22 2200  ceFAZolin (ANCEF) IVPB 1 g/50 mL premix  Status:  Discontinued        1 g 100 mL/hr over 30 Minutes Intravenous Every 8 hours 11/04/22 1116  11/05/22 0742   11/02/22 1015  ceFAZolin (ANCEF) IVPB 1 g/50 mL premix  Status:  Discontinued        1 g 100 mL/hr over 30 Minutes Intravenous Every 12 hours 11/02/22 0922 11/04/22 1116   10/30/22 2200  ceFAZolin (ANCEF) IVPB 2g/100 mL premix        2 g 200 mL/hr over 30 Minutes Intravenous Every 8 hours 10/30/22 2108 10/31/22 0541        Objective: Vitals:   11/17/22 0435 11/17/22 0436 11/17/22 0437 11/17/22 0853  BP: (!) 150/83   (!) 143/82  Pulse: (!) 24 (!) 102 (!) 102 93  Resp: (!) 24 (!) 25 (!) 22 (!) 21  Temp: 99 F (37.2 C)   99 F (37.2 C)  TempSrc: Oral   Oral  SpO2: 94% 94% 95% 96%  Weight:      Height:        Intake/Output Summary (Last 24 hours) at 11/17/2022 1155 Last data filed at 11/17/2022 8119 Gross per 24 hour  Intake 1094.42 ml  Output 700 ml  Net 394.42 ml   Filed Weights   11/10/22 0425 11/12/22 0605 11/13/22 0340  Weight: 91 kg 90.4 kg 87.8 kg    Examination:  General exam: Appears calm and comfortable  Respiratory system: Clear to auscultation. Respiratory effort normal. No respiratory distress. No conversational dyspnea.  Cardiovascular system: S1 & S2 heard, RRR. No murmurs. No pedal edema. Gastrointestinal system: Abdomen is nondistended, soft and nontender. Normal bowel sounds heard. Central nervous system: Alert and oriented. No focal neurological deficits. Speech clear.  Extremities: Symmetric in appearance  Skin: No rashes, lesions or ulcers on exposed skin  Psychiatry: Judgement and insight appear normal. Mood & affect appropriate.   Data Reviewed: I have personally reviewed following labs and imaging studies  CBC: Recent Labs  Lab 11/14/22 1033 11/15/22 0754 11/16/22 0123 11/17/22 0120  WBC 15.5* 14.4* 15.8* 14.0*  HGB 13.8 12.5* 12.1* 12.8*  HCT 43.6 38.9* 37.2* 39.2  MCV 81.3 81.2 79.8* 78.4*  PLT 485* 373 427* 433*   Basic Metabolic Panel: Recent Labs  Lab 11/14/22 1033 11/15/22 0754 11/16/22 0123 11/17/22 0120   NA 133* 134* 126* 128*  K 4.1 3.4* 3.5 5.0  CL 105 107 100 99  CO2 21* 19* 20* 18*  GLUCOSE 85 68* 141* 96  BUN 30* 30* 25* 23  CREATININE 1.26* 1.38* 1.19 1.18  CALCIUM 8.1* 8.1* 7.6* 7.9*  MG  --  2.2  --  1.8   GFR: Estimated Creatinine Clearance: 69.6 mL/min (by C-G formula based on SCr of 1.18 mg/dL). Liver Function Tests: Recent Labs  Lab 11/15/22 0754 11/16/22 0123 11/17/22 0120  AST 139* 121* 129*  ALT 19 16 19   ALKPHOS 136* 141* 136*  BILITOT 1.1 1.0 0.9  PROT 7.5 7.2 7.5  ALBUMIN 1.6* <1.5* 1.6*   No results for input(s): "LIPASE", "AMYLASE" in the last 168 hours. No results for input(s): "AMMONIA" in the last 168 hours. Coagulation Profile: No results for input(s): "INR", "PROTIME" in the last 168 hours. Cardiac Enzymes: No results for input(s): "CKTOTAL", "CKMB", "CKMBINDEX", "TROPONINI" in the last 168 hours. BNP (last 3 results) No results for input(s): "PROBNP" in the last 8760 hours. HbA1C: No results for input(s): "HGBA1C" in the last 72 hours. CBG: Recent Labs  Lab 11/16/22 1609 11/16/22 2003 11/16/22 2320 11/17/22 0432 11/17/22 0856  GLUCAP 117* 100* 94 107* 103*   Lipid Profile: No results for input(s): "CHOL", "HDL", "LDLCALC", "TRIG", "CHOLHDL", "LDLDIRECT" in the last 72 hours. Thyroid Function Tests: Recent Labs    11/15/22 0754  TSH 3.097  FREET4 1.26*   Anemia Panel: No results for input(s): "VITAMINB12", "FOLATE", "FERRITIN", "TIBC", "IRON", "RETICCTPCT" in the last 72 hours. Sepsis Labs: No results for input(s): "PROCALCITON", "LATICACIDVEN" in the last 168 hours.  Recent Results (from the past 240 hour(s))  SARS Coronavirus 2 by RT PCR (hospital order, performed in [facility] hospital lab) *cepheid single result test* Anterior Nasal Swab     Status: None   Collection Time: 11/14/22  9:30 AM   Specimen: Anterior Nasal Swab  Result Value Ref Range Status   SARS Coronavirus 2 by RT PCR NEGATIVE NEGATIVE Final    Comment:  Performed at [facility], [address] [place], [place] [address]  Culture, blood (Routine X 2) w Reflex to ID Panel     Status: None (Preliminary result)   Collection Time: 11/14/22 10:10 AM   Specimen: BLOOD LEFT HAND  Result Value Ref Range Status   Specimen Description BLOOD LEFT HAND  Final   Special Requests   Final    BOTTLES DRAWN AEROBIC ONLY Blood Culture adequate volume   Culture   Final    NO GROWTH 3 DAYS Performed at [facility], [address], [place], [place] [address]    Report Status PENDING  Incomplete  Culture, blood (Routine X 2) w Reflex to ID Panel     Status: None (Preliminary result)   Collection Time: 11/14/22 10:33 AM   Specimen: BLOOD RIGHT HAND  Result Value Ref Range Status   Specimen Description BLOOD RIGHT HAND  Final   Special Requests   Final    BOTTLES DRAWN AEROBIC ONLY Blood Culture adequate volume   Culture   Final    NO GROWTH 3 DAYS Performed at [facility], [address] [place], [place] [address]    Report Status PENDING  Incomplete      Radiology Studies: No results found.    Scheduled Meds:  aspirin  81 mg Oral Daily   atorvastatin  80 mg Oral QHS   bacitracin   Topical BID   docusate sodium  100 mg Oral Daily   feeding supplement  1 Container Oral TID BM   heparin injection (subcutaneous)  5,000 Units Subcutaneous Q8H   metoCLOPramide (REGLAN) injection  5 mg Intravenous Q12H   ondansetron (ZOFRAN) IV  4 mg Intravenous Q8H   pantoprazole  40 mg Oral Daily   polyethylene glycol  17 g Oral Daily   Continuous Infusions:  sodium chloride Stopped (11/06/22 1304)   dextrose 5 % and 0.45 % NaCl 50 mL/hr at 11/17/22 0438     LOS: 18 days   Time spent: 25 minutes   Noralee Stain, DO [facility] 11/17/2022, 11:55 AM   Available via Epic secure chat 7am-7pm After these hours, please refer to coverage provider listed on [email or web address]

## 2022-11-18 DIAGNOSIS — I71019 Dissection of thoracic aorta, unspecified: Secondary | ICD-10-CM | POA: Diagnosis not present

## 2022-11-18 LAB — BASIC METABOLIC PANEL
Anion gap: 9 (ref 5–15)
BUN: 22 mg/dL (ref 8–23)
CO2: 19 mmol/L — ABNORMAL LOW (ref 22–32)
Calcium: 8 mg/dL — ABNORMAL LOW (ref 8.9–10.3)
Chloride: 101 mmol/L (ref 98–111)
Creatinine, Ser: 1.31 mg/dL — ABNORMAL HIGH (ref 0.61–1.24)
GFR, Estimated: 59 mL/min — ABNORMAL LOW (ref >60)
Glucose, Bld: 98 mg/dL (ref 70–99)
Potassium: 4.5 mmol/L (ref 3.5–5.1)
Sodium: 129 mmol/L — ABNORMAL LOW (ref 135–145)

## 2022-11-18 LAB — GLUCOSE, CAPILLARY
Glucose-Capillary: 100 mg/dL — ABNORMAL HIGH (ref 70–99)
Glucose-Capillary: 111 mg/dL — ABNORMAL HIGH (ref 70–99)
Glucose-Capillary: 93 mg/dL (ref 70–99)
Glucose-Capillary: 96 mg/dL (ref 70–99)
Glucose-Capillary: 96 mg/dL (ref 70–99)
Glucose-Capillary: 96 mg/dL (ref 70–99)
Glucose-Capillary: 99 mg/dL (ref 70–99)

## 2022-11-18 LAB — CULTURE, BLOOD (ROUTINE X 2)
Culture: NO GROWTH
Special Requests: ADEQUATE
Special Requests: ADEQUATE

## 2022-11-18 LAB — CBC
HCT: 39 % (ref 39.0–52.0)
Hemoglobin: 12.7 g/dL — ABNORMAL LOW (ref 13.0–17.0)
MCH: 25.7 pg — ABNORMAL LOW (ref 26.0–34.0)
MCHC: 32.6 g/dL (ref 30.0–36.0)
MCV: 78.8 fL — ABNORMAL LOW (ref 80.0–100.0)
Platelets: 430 10*3/uL — ABNORMAL HIGH (ref 150–400)
RBC: 4.95 MIL/uL (ref 4.22–5.81)
RDW: 20.4 % — ABNORMAL HIGH (ref 11.5–15.5)
WBC: 13.3 10*3/uL — ABNORMAL HIGH (ref 4.0–10.5)
nRBC: 0.4 % — ABNORMAL HIGH (ref 0.0–0.2)

## 2022-11-18 MED ORDER — AMLODIPINE BESYLATE 5 MG PO TABS
5.0000 mg | ORAL_TABLET | Freq: Every day | ORAL | Status: DC
  Administered 2022-11-18: 5 mg via ORAL
  Filled 2022-11-18: qty 1

## 2022-11-18 NOTE — Progress Notes (Signed)
PROGRESS NOTE    Mathew [name]  XLK:[id number] DOB: May 23, 1950 DOA: 10/30/2022 PCP: Patient, No Pcp Per     Brief Narrative:  Mathew [name] is a 72 year old male with PMHx significant for HCC on chemotherapy, paroxysmal atrial fibrillation, hypertension, and hepatitis C status post treatment.  He initially presented to [facility] Emergency department on 6/21 with complaints of sudden onset chest pain and was diagnosed with thrombosed type B thoracic aortic dissection extending from the distal aortic arch to the level of the diaphragm -initially discharged on the 25th due to no expansion, ultimately returning on the 26th for worsening pain where imaging confirmed aortic rupture requiring endovascular repair by vascular surgery.   Hospital Course: 6/25 Discharged with stable type B Ao dissection with intramural thrombosis  6/26 Returned for admittance with ascending Ao rupture. Emergent OR for repair 6/27 Patient developed left-sided weakness, MRI brain noted for multiple acute infarcts with embolic pattern, MRI C-spine with cord ischemia on the right side.   6/28 Lumbar drain placed 6/30 Started on vasopressor support to maintain SBP greater than 140 7/1 Vascular surgery continue plans to continue lumbar drain  7/2 Nausea with emesis, . Increased O2 requirement with faint bibasilar crackles. CXR + Lasix x 1. Lumbar drain removed. 7/4 Coretrak clogged -ultimately removed as patient is tolerating p.o. 7/5 Continues to progress with PT 7/8 Discussion with Oncology reveals no real therapy options - palliative care consulted 7/9 Dr. Natale Milch discussed goals of care with patient and wife at bedside, plan for palliative care meeting 7/10 Family meeting with palliative care --> Full scope of care, wants to go to CIR  7/11 Fever 100.4  7/12 Severe hypoglycemic episode CBG <10  7/13 Agreeable to DNR, treat the treatable, wants to go to CIR, then home with hospice   New events last 24 hours /  Subjective: Nausea seems to be better.  Was able to eat some oatmeal this morning.  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)   Normal anion gap metabolic acidosis   Malnutrition of moderate degree   CVA (cerebral vascular accident) (HCC)   Rupture type B aortic dissection status post emergent T EVAR complicated by spinal cord ischemia, embolic stroke -Vascular surgery following  Acute bilateral multifocal embolic/ischemic strokes -MRI Brain showed bilateral multifocal embolic ischemic stroke -Continues to improve with ongoing physical therapy -Continue aspirin, lipitor    Cervical spinal cord edema, concern for infarct Ambulatory dysfunction -MRI C-spine showing spinal cord signal in the C6-C7, edema versus stroke.  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  -On atezolizumab plus bevacizumab every 3 weeks prior to admission.  -Will need outpatient follow-up with oncology. Atezolizumab has been associated with aortic dissection previously per FDA drug information sheet (combined in the 6% fatal adverse outcomes associated with the drug). Retrospective trials have also shown associations with dissections associated with bevacizumab, but the FDA has not included this in the drug information sheet to date.  -Per Dr. Mosetta Putt, patient is unlikely to be a candidate for more cancer treatments with his stroke and aortic dissection.  Overall prognosis is poor, likely 3 to 6 months  Acute transient metabolic encephalopathy, resolved Acute kidney injury likely ATN in the setting of hemodynamic  insults, resolved  Questionable COPD exacerbation, resolved  Prolonged history of tobacco abuse  Goals of care -Appreciate palliative care medicine  SIRS, not POA -Fever 100.4 overnight 7/10 to 7/11 associated with tachycardia and tachypnea, oxygen desaturation down to 88% requiring 2 L nasal cannula O2 -Blood cultures ordered -Chest x-ray reviewed independently, stable from previous examination with minimal atelectasis, no focal infiltrate -COVID test negative -UA unremarkable -WBC elevated, although has been elevated previously and is not any worse, continue to watch fever curve -Monitor off antibiotics -Afebrile >48 hours   Severe hypoglycemia with metabolic encephalopathy -Encephalopathy has resolved now that his blood sugar is within acceptable range -Unclear why he had hypoglycemic episode.  He does have diagnosis of prediabetes, is not on insulin in the hospital -Stop IV fluid, continue CBG checks, encourage p.o. intake  Non-severe malnutrition -Albumin < 1.5 -Appreciate dietitian   Nausea and vomiting -Scheduled Zofran and Reglan for symptom management -Improved  DVT prophylaxis:  heparin injection 5,000 Units Start: 11/06/22 1400 SCDs Start: 10/30/22 1959  Code Status: DNR   Family Communication: Wife at bedside Disposition Plan: Need placement Status is: Inpatient Remains inpatient appropriate because: CIR versus SNF.  Conversation ongoing  Antimicrobials:  Anti-infectives (From admission, onward)    Start     Dose/Rate Route Frequency Ordered Stop   11/05/22 1400  ceFAZolin (ANCEF) IVPB 1 g/50 mL premix  Status:  Discontinued        1 g 100 mL/hr over 30 Minutes Intravenous Every 8 hours 11/05/22 0742 11/05/22 1839   11/04/22 2200  ceFAZolin (ANCEF) IVPB 1 g/50 mL premix  Status:  Discontinued        1 g 100 mL/hr over 30 Minutes Intravenous Every 8 hours 11/04/22 1116 11/05/22 0742   11/02/22 1015  ceFAZolin (ANCEF) IVPB 1 g/50 mL premix  Status:   Discontinued        1 g 100 mL/hr over 30 Minutes Intravenous Every 12 hours 11/02/22 0922 11/04/22 1116   10/30/22 2200  ceFAZolin (ANCEF) IVPB 2g/100 mL premix        2 g 200 mL/hr over 30 Minutes Intravenous Every 8 hours 10/30/22 2108 10/31/22 0541        Objective: Vitals:   11/17/22 2358 11/18/22 0350 11/18/22 0806 11/18/22 1150  BP: (!) 160/99 (!) 144/90 (!) 153/93 (!) 157/93  Pulse: 61 (!) 105 (!) 108 (!) 101  Resp: 20 20 19 20   Temp: 99.1 F (37.3 C) 99.6 F (37.6 C) 99.2 F (37.3 C) 99 F (37.2 C)  TempSrc: Oral Axillary Oral Oral  SpO2: 94% 94% 97% 100%  Weight:      Height:        Intake/Output Summary (Last 24 hours) at 11/18/2022 1215 Last data filed at 11/18/2022 1153 Gross per 24 hour  Intake 1111.33 ml  Output 2050 ml  Net -938.67 ml   Filed Weights   11/10/22 0425 11/12/22 0605 11/13/22 0340  Weight: 91 kg 90.4 kg 87.8 kg    Examination:  General exam: Appears calm and comfortable  Respiratory system: Clear to auscultation. Respiratory effort normal. No respiratory distress. No conversational dyspnea.  Cardiovascular system: S1 & S2 heard, tachycardic, regular rhythm. No murmurs. No pedal edema. Gastrointestinal system: Abdomen is nondistended, soft and nontender. Normal bowel sounds heard. Central nervous system: Alert and oriented. No focal neurological deficits. Speech clear.  Extremities: Symmetric in appearance  Skin: No  rashes, lesions or ulcers on exposed skin  Psychiatry: Judgement and insight appear normal. Mood & affect appropriate.   Data Reviewed: I have personally reviewed following labs and imaging studies  CBC: Recent Labs  Lab 11/14/22 1033 11/15/22 0754 11/16/22 0123 11/17/22 0120 11/18/22 0141  WBC 15.5* 14.4* 15.8* 14.0* 13.3*  HGB 13.8 12.5* 12.1* 12.8* 12.7*  HCT 43.6 38.9* 37.2* 39.2 39.0  MCV 81.3 81.2 79.8* 78.4* 78.8*  PLT 485* 373 427* 433* 430*   Basic Metabolic Panel: Recent Labs  Lab 11/14/22 1033  11/15/22 0754 11/16/22 0123 11/17/22 0120 11/18/22 0141  NA 133* 134* 126* 128* 129*  K 4.1 3.4* 3.5 5.0 4.5  CL 105 107 100 99 101  CO2 21* 19* 20* 18* 19*  GLUCOSE 85 68* 141* 96 98  BUN 30* 30* 25* 23 22  CREATININE 1.26* 1.38* 1.19 1.18 1.31*  CALCIUM 8.1* 8.1* 7.6* 7.9* 8.0*  MG  --  2.2  --  1.8  --    GFR: Estimated Creatinine Clearance: 62.7 mL/min (A) (by C-G formula based on SCr of 1.31 mg/dL (H)). Liver Function Tests: Recent Labs  Lab 11/15/22 0754 11/16/22 0123 11/17/22 0120  AST 139* 121* 129*  ALT 19 16 19   ALKPHOS 136* 141* 136*  BILITOT 1.1 1.0 0.9  PROT 7.5 7.2 7.5  ALBUMIN 1.6* <1.5* 1.6*   No results for input(s): "LIPASE", "AMYLASE" in the last 168 hours. No results for input(s): "AMMONIA" in the last 168 hours. Coagulation Profile: No results for input(s): "INR", "PROTIME" in the last 168 hours. Cardiac Enzymes: No results for input(s): "CKTOTAL", "CKMB", "CKMBINDEX", "TROPONINI" in the last 168 hours. BNP (last 3 results) No results for input(s): "PROBNP" in the last 8760 hours. HbA1C: No results for input(s): "HGBA1C" in the last 72 hours. CBG: Recent Labs  Lab 11/17/22 1957 11/18/22 0000 11/18/22 0353 11/18/22 0815 11/18/22 1150  GLUCAP 118* 111* 96 100* 93   Lipid Profile: No results for input(s): "CHOL", "HDL", "LDLCALC", "TRIG", "CHOLHDL", "LDLDIRECT" in the last 72 hours. Thyroid Function Tests: No results for input(s): "TSH", "T4TOTAL", "FREET4", "T3FREE", "THYROIDAB" in the last 72 hours.  Anemia Panel: No results for input(s): "VITAMINB12", "FOLATE", "FERRITIN", "TIBC", "IRON", "RETICCTPCT" in the last 72 hours. Sepsis Labs: No results for input(s): "PROCALCITON", "LATICACIDVEN" in the last 168 hours.  Recent Results (from the past 240 hour(s))  SARS Coronavirus 2 by RT PCR (hospital order, performed in [facility] hospital lab) *cepheid single result test* Anterior Nasal Swab     Status: None   Collection Time: 11/14/22   9:30 AM   Specimen: Anterior Nasal Swab  Result Value Ref Range Status   SARS Coronavirus 2 by RT PCR NEGATIVE NEGATIVE Final    Comment: Performed at [facility], [address] [place], [place] [address]  Culture, blood (Routine X 2) w Reflex to ID Panel     Status: None (Preliminary result)   Collection Time: 11/14/22 10:10 AM   Specimen: BLOOD LEFT HAND  Result Value Ref Range Status   Specimen Description BLOOD LEFT HAND  Final   Special Requests   Final    BOTTLES DRAWN AEROBIC ONLY Blood Culture adequate volume   Culture   Final    NO GROWTH 4 DAYS Performed at [facility], [address], [place], [place] [address]    Report Status PENDING  Incomplete  Culture, blood (Routine X 2) w Reflex to ID Panel     Status: None (Preliminary  result)   Collection Time: 11/14/22 10:33 AM   Specimen: BLOOD RIGHT HAND  Result Value Ref Range Status   Specimen Description BLOOD RIGHT HAND  Final   Special Requests   Final    BOTTLES DRAWN AEROBIC ONLY Blood Culture adequate volume   Culture   Final    NO GROWTH 4 DAYS Performed at [facility], [address] [place], [place] [address]    Report Status PENDING  Incomplete      Radiology Studies: No results found.    Scheduled Meds:  amLODipine  5 mg Oral Daily   aspirin  81 mg Oral Daily   atorvastatin  80 mg Oral QHS   bacitracin   Topical BID   docusate sodium  100 mg Oral Daily   feeding supplement  1 Container Oral TID BM   heparin injection (subcutaneous)  5,000 Units Subcutaneous Q8H   metoCLOPramide (REGLAN) injection  5 mg Intravenous Q12H   ondansetron (ZOFRAN) IV  4 mg Intravenous Q8H   pantoprazole  40 mg Oral Daily   polyethylene glycol  17 g Oral Daily   Continuous Infusions:  sodium chloride Stopped (11/06/22 1304)     LOS: 19 days   Time spent: 25 minutes   Noralee Stain, DO [facility] 11/18/2022, 12:15 PM   Available via Epic secure chat 7am-7pm After these hours,  please refer to coverage provider listed on [email or web address]

## 2022-11-18 NOTE — Progress Notes (Cosign Needed Addendum)
  Progress Note    11/18/2022 6:36 AM 19 Days Post-Op  Subjective:  no complaints   Tm 99.6  Vitals:   11/17/22 2358 11/18/22 0350  BP: (!) 160/99 (!) 144/90  Pulse: 61 (!) 105  Resp: 20 20  Temp: 99.1 F (37.3 C) 99.6 F (37.6 C)  SpO2: 94% 94%    Physical Exam: General:  no distress; resting comfortably Cardiac:  regular Lungs:  non labored Extremities:  palpable DP and radial pulses bilaterally   CBC    Component Value Date/Time   WBC 13.3 (H) 11/18/2022 0141   RBC 4.95 11/18/2022 0141   HGB 12.7 (L) 11/18/2022 0141   HGB 18.4 (H) 10/23/2022 1417   HCT 39.0 11/18/2022 0141   PLT 430 (H) 11/18/2022 0141   PLT 238 10/23/2022 1417   MCV 78.8 (L) 11/18/2022 0141   MCH 25.7 (L) 11/18/2022 0141   MCHC 32.6 11/18/2022 0141   RDW 20.4 (H) 11/18/2022 0141   LYMPHSABS 3.0 10/23/2022 1417   MONOABS 0.7 10/23/2022 1417   EOSABS 0.1 10/23/2022 1417   BASOSABS 0.2 (H) 10/23/2022 1417    BMET    Component Value Date/Time   NA 129 (L) 11/18/2022 0141   K 4.5 11/18/2022 0141   CL 101 11/18/2022 0141   CO2 19 (L) 11/18/2022 0141   GLUCOSE 98 11/18/2022 0141   BUN 22 11/18/2022 0141   CREATININE 1.31 (H) 11/18/2022 0141   CREATININE 1.25 (H) 10/23/2022 1417   CALCIUM 8.0 (L) 11/18/2022 0141   GFRNONAA 59 (L) 11/18/2022 0141   GFRNONAA >60 10/23/2022 1417   GFRAA >60 06/12/2018 0815    INR    Component Value Date/Time   INR 1.3 (H) 11/01/2022 1551     Intake/Output Summary (Last 24 hours) at 11/18/2022 0636 Last data filed at 11/18/2022 0453 Gross per 24 hour  Intake 1111.33 ml  Output 1600 ml  Net -488.67 ml      Assessment/Plan:  72 y.o. 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  Per oncology, he is unlikely to be a candidate for  further cancer treatment with stroke and aortic dissection and his overall prognosis is poor.    Doreatha Massed, PA-C [facility] [phone] 11/18/2022 6:36 AM

## 2022-11-18 NOTE — Progress Notes (Signed)
Inpatient Rehab Admissions Coordinator:    I met with pt. And wife to discuss CIR vs. SNF. Explained that pt. May not progress much or may even experience a regression in function while on rehab if he were to experience significant disease progression while on CIR. They state understanding. Pt. And wife state that they could potentially hire private duty nurse to assist wife after d/c from CIR if he fails to progress to a level at which his wife could care for him on her own. I will send updates to pt.'s insurance for auth today once PT/OT have documented.   Megan Salon, MS, CCC-SLP Rehab Admissions Coordinator  [phone] (celll) [phone] (office)

## 2022-11-18 NOTE — Progress Notes (Signed)
Occupational Therapy Treatment Patient Details Name: Mathew [name] MRN: [id number] DOB: August 12, 1950 Today's Date: 11/18/2022   History of present illness Pt is a 72 y/o M presenting to ED on 6/21 with chest pain and EKG with concern for ST elevation d/c'd 6/25, returned 6/26 and was urgently taken to OR. 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  Post op day 1 pt with L sided weakness, concern for spinal cord ischemia vs CVA. 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   OT comments  Beginning of session spent with pt verbalizing his feelings on his overall medical status and picking which rehab placement he would like to go to. Provided education on both types of rehab to provide some clarity. With reports of neck pain that has been on-going since his admission, session was focused on manual therapy to address. Pt presented with moderate fascial restrictions in bilateral upper trapezius and cervical region L>R. Pt had good response to manual techniques. Pt educated on isometric strengthening and neck ROM exercises to complete independently. Pt demonstrated and verbalized understanding. Pt continues to be appropriate for intensive inpatient follow up therapy, >3 hours/day. OT will continue to follow patient acutely.     Recommendations for follow up therapy are one component of a multi-disciplinary discharge planning process, led by the attending physician.  Recommendations may be updated based on patient status, additional functional criteria and insurance authorization.    Assistance Recommended at Discharge Frequent or constant Supervision/Assistance  Patient can return home with the  following  A little help with walking and/or transfers;A little help with bathing/dressing/bathroom;Help with stairs or ramp for entrance;Assist for transportation;Assistance with cooking/housework;Direct supervision/assist for financial management;Direct supervision/assist for medications management         Precautions / Restrictions Precautions Precautions: Fall Restrictions Weight Bearing Restrictions: Yes LLE Weight Bearing: Weight bearing as tolerated       Mobility Bed Mobility Overal bed mobility:  (declined OOB activity during session.)     Transfers Overall transfer level:  (declined OOB activity during session)          ADL either performed or assessed with clinical judgement      Cognition Arousal/Alertness: Awake/alert Behavior During Therapy: Flat affect Overall Cognitive Status: Within Functional Limits for tasks assessed  General Comments: talked sofly. Expressed not knowing what he should do in regards to rehab when discharged.        Exercises Other Exercises Other Exercises: Myofascial release completed to bilateral upper trapezius and lateral/posterior neck to decrease fascial restrictions and muscle knots while decreasing pain level when completing activities while seated EOB. Other Exercises: Supine, isometric strengthening, scapular retraction, 10x3" hold.            Pertinent Vitals/ Pain       Pain Assessment Pain Assessment: Faces Faces Pain Scale: Hurts little more Pain Location: posterior neck when sitting up Pain Descriptors / Indicators: Grimacing, Sore Pain Intervention(s): Limited activity within patient's tolerance, Monitored during session, Other (comment) (manual therapy)         Frequency  Min 1X/week        Progress Toward Goals  OT Goals(current goals can now be found in the care plan section)  Progress towards OT goals: Progressing toward  goals     Plan Discharge plan remains appropriate;Frequency remains  appropriate       AM-PAC OT "6 Clicks" Daily Activity     Outcome Measure   Help from another person eating meals?: A Little Help from another person taking care of personal grooming?: A Little Help from another person toileting, which includes using toliet, bedpan, or urinal?: A Lot Help from another person bathing (including washing, rinsing, drying)?: A Lot Help from another person to put on and taking off regular upper body clothing?: A Lot Help from another person to put on and taking off regular lower body clothing?: A Lot 6 Click Score: 14    End of Session    OT Visit Diagnosis: Unsteadiness on feet (R26.81);Other abnormalities of gait and mobility (R26.89);Muscle weakness (generalized) (M62.81);Pain Pain - Right/Left:  (bilateral) Pain - part of body:  (neck)   Activity Tolerance Patient tolerated treatment well   Patient Left in bed;with call bell/phone within reach;with bed alarm set;with family/visitor present           Time: 4098-1191 OT Time Calculation (min): 37 min  Charges: OT General Charges $OT Visit: 1 Visit OT Treatments $Therapeutic Activity: 8-22 mins $Therapeutic Exercise: 8-22 mins  Limmie Patricia, OTR/L,CBIS  Supplemental OT - [facility] and [facility] Secure Chat Preferred    Dickie Labarre, Charisse March 11/18/2022, 1:38 PM

## 2022-11-18 NOTE — Progress Notes (Signed)
Physical Therapy Treatment Patient Details Name: Mathew [name] MRN: [id number] DOB: [birth year] Today's Date: 11/18/2022   History of Present Illness Pt is a 72 y/o M presenting to ED on 6/21 with chest pain and EKG with concern for ST elevation d/c'd 6/25, returned 6/26 and was urgently taken to OR. 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  Post op day 1 pt with L sided weakness, concern for spinal cord ischemia vs CVA. 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    PT Comments  Pt received in supine and agreeable to session. Pt reporting nausea and weakness this session that has slightly improved over the last few days. Pt requiring increased time for bed mobility due to decreased core strength and intermittent posterior lean that the pt was able to correct. Pt requesting to use the [facility] and required increased time for a BM with pt reporting that he is constipated. Pt able to stand for total A with pericare and pivot to the EOB for a seated rest break before a gait trial. Pt able to tolerate a short gait trial in the room with cues and min A for upright posture due to increased trunk flexion with progressed distance. Pt continues to benefit from PT services to progress toward functional mobility goals.     Assistance Recommended at Discharge Frequent or constant Supervision/Assistance  If plan is discharge home, recommend the following:  Can travel by private vehicle    A lot of help with walking and/or transfers;A lot of help with bathing/dressing/bathroom;Assist for transportation;Help with stairs or ramp for entrance      Equipment Recommendations  Wheelchair (measurements  PT);Wheelchair cushion (measurements PT);Rolling walker (2 wheels);BSC/3in1    Recommendations for Other Services       Precautions / Restrictions Precautions Precautions: Fall Restrictions Weight Bearing Restrictions: Yes LLE Weight Bearing: Weight bearing as tolerated     Mobility  Bed Mobility Overal bed mobility: Needs Assistance Bed Mobility: Supine to Sit, Sit to Supine     Supine to sit: Min guard, HOB elevated Sit to supine: Min assist   General bed mobility comments: Min A for LLE elevation to EOB when returning to supine    Transfers Overall transfer level: Needs assistance Equipment used: Rolling walker (2 wheels) Transfers: Sit to/from Stand, Bed to chair/wheelchair/BSC Sit to Stand: Min assist, From elevated surface   Step pivot transfers: Min guard       General transfer comment: From elevated EOB x2 and BSC x1 with min A for power up. Cues for hand placement.    Ambulation/Gait Ambulation/Gait assistance: Min assist Gait Distance (Feet): 15 Feet Assistive device: Rolling walker (2 wheels) Gait Pattern/deviations: Step-through pattern, Decreased stride length, Trunk flexed       General Gait Details: Pt demonstrating slow step-through pattern with short steps. Cues and min A for upright posture due to increased trunk flexion with progressed distance.      Balance Overall balance assessment: Needs assistance Sitting-balance support: Bilateral upper extremity supported, Feet supported Sitting balance-Leahy Scale: Fair Sitting balance - Comments: sitting EOB   Standing balance support: Bilateral upper extremity supported, Reliant on assistive device for balance, During functional activity Standing balance-Leahy Scale: Poor Standing balance comment: with RW support  Cognition Arousal/Alertness: Awake/alert Behavior During Therapy: Flat affect Overall Cognitive Status: Within Functional Limits for tasks  assessed                                          Exercises      General Comments        Pertinent Vitals/Pain Pain Assessment Pain Assessment: Faces Faces Pain Scale: Hurts a little bit Pain Location: generalized Pain Descriptors / Indicators: Grimacing, Sore Pain Intervention(s): Monitored during session, Repositioned     PT Goals (current goals can now be found in the care plan section) Acute Rehab PT Goals Patient Stated Goal: get better PT Goal Formulation: With patient/family Time For Goal Achievement: 11/19/22 Potential to Achieve Goals: Good Progress towards PT goals: Progressing toward goals    Frequency    Min 1X/week      PT Plan Current plan remains appropriate       AM-PAC PT "6 Clicks" Mobility   Outcome Measure  Help needed turning from your back to your side while in a flat bed without using bedrails?: A Little Help needed moving from lying on your back to sitting on the side of a flat bed without using bedrails?: A Little Help needed moving to and from a bed to a chair (including a wheelchair)?: A Little Help needed standing up from a chair using your arms (e.g., wheelchair or bedside chair)?: A Little Help needed to walk in hospital room?: A Little Help needed climbing 3-5 steps with a railing? : Total 6 Click Score: 16    End of Session Equipment Utilized During Treatment: Gait belt Activity Tolerance: Patient tolerated treatment well Patient left: in bed;with call bell/phone within reach;with bed alarm set Nurse Communication: Mobility status PT Visit Diagnosis: Unsteadiness on feet (R26.81);Muscle weakness (generalized) (M62.81);Difficulty in walking, not elsewhere classified (R26.2)     Time: 6578-4696 PT Time Calculation (min) (ACUTE ONLY): 32 min  Charges:    $Gait Training: 8-22 mins $Therapeutic Activity: 8-22 mins PT General Charges $$ ACUTE PT VISIT: 1 Visit                     Johny Shock,  PTA Acute Rehabilitation Services Secure Chat Preferred  [phone]    Johny Shock 11/18/2022, 4:02 PM

## 2022-11-19 ENCOUNTER — Encounter (HOSPITAL_COMMUNITY): Payer: Self-pay | Admitting: Physical Medicine and Rehabilitation

## 2022-11-19 ENCOUNTER — Other Ambulatory Visit: Payer: Self-pay

## 2022-11-19 ENCOUNTER — Inpatient Hospital Stay (HOSPITAL_COMMUNITY)
Admission: RE | Admit: 2022-11-19 | Discharge: 2022-12-05 | DRG: 056 | Disposition: E | Payer: BC Managed Care – PPO | Source: Intra-hospital | Attending: Physical Medicine and Rehabilitation | Admitting: Physical Medicine and Rehabilitation

## 2022-11-19 DIAGNOSIS — Z95820 Peripheral vascular angioplasty status with implants and grafts: Secondary | ICD-10-CM

## 2022-11-19 DIAGNOSIS — E860 Dehydration: Secondary | ICD-10-CM | POA: Diagnosis not present

## 2022-11-19 DIAGNOSIS — Z9079 Acquired absence of other genital organ(s): Secondary | ICD-10-CM

## 2022-11-19 DIAGNOSIS — Z9081 Acquired absence of spleen: Secondary | ICD-10-CM

## 2022-11-19 DIAGNOSIS — D72829 Elevated white blood cell count, unspecified: Secondary | ICD-10-CM | POA: Diagnosis present

## 2022-11-19 DIAGNOSIS — Z515 Encounter for palliative care: Secondary | ICD-10-CM | POA: Diagnosis not present

## 2022-11-19 DIAGNOSIS — R600 Localized edema: Secondary | ICD-10-CM | POA: Diagnosis not present

## 2022-11-19 DIAGNOSIS — C220 Liver cell carcinoma: Secondary | ICD-10-CM | POA: Diagnosis present

## 2022-11-19 DIAGNOSIS — M542 Cervicalgia: Secondary | ICD-10-CM | POA: Diagnosis present

## 2022-11-19 DIAGNOSIS — C7801 Secondary malignant neoplasm of right lung: Secondary | ICD-10-CM | POA: Diagnosis present

## 2022-11-19 DIAGNOSIS — I614 Nontraumatic intracerebral hemorrhage in cerebellum: Secondary | ICD-10-CM | POA: Diagnosis not present

## 2022-11-19 DIAGNOSIS — I10 Essential (primary) hypertension: Secondary | ICD-10-CM | POA: Diagnosis present

## 2022-11-19 DIAGNOSIS — G9389 Other specified disorders of brain: Secondary | ICD-10-CM | POA: Diagnosis not present

## 2022-11-19 DIAGNOSIS — E440 Moderate protein-calorie malnutrition: Secondary | ICD-10-CM | POA: Diagnosis present

## 2022-11-19 DIAGNOSIS — I71012 Dissection of descending thoracic aorta: Secondary | ICD-10-CM | POA: Diagnosis present

## 2022-11-19 DIAGNOSIS — I639 Cerebral infarction, unspecified: Secondary | ICD-10-CM | POA: Diagnosis not present

## 2022-11-19 DIAGNOSIS — D688 Other specified coagulation defects: Secondary | ICD-10-CM | POA: Diagnosis present

## 2022-11-19 DIAGNOSIS — Z66 Do not resuscitate: Secondary | ICD-10-CM | POA: Diagnosis present

## 2022-11-19 DIAGNOSIS — I71019 Dissection of thoracic aorta, unspecified: Secondary | ICD-10-CM | POA: Diagnosis present

## 2022-11-19 DIAGNOSIS — Z8042 Family history of malignant neoplasm of prostate: Secondary | ICD-10-CM

## 2022-11-19 DIAGNOSIS — Z7189 Other specified counseling: Secondary | ICD-10-CM | POA: Diagnosis not present

## 2022-11-19 DIAGNOSIS — I6200 Nontraumatic subdural hemorrhage, unspecified: Secondary | ICD-10-CM | POA: Diagnosis not present

## 2022-11-19 DIAGNOSIS — R5381 Other malaise: Secondary | ICD-10-CM | POA: Diagnosis present

## 2022-11-19 DIAGNOSIS — R059 Cough, unspecified: Secondary | ICD-10-CM | POA: Diagnosis not present

## 2022-11-19 DIAGNOSIS — R000 Tachycardia, unspecified: Secondary | ICD-10-CM | POA: Diagnosis not present

## 2022-11-19 DIAGNOSIS — R630 Anorexia: Secondary | ICD-10-CM | POA: Diagnosis not present

## 2022-11-19 DIAGNOSIS — Z8546 Personal history of malignant neoplasm of prostate: Secondary | ICD-10-CM

## 2022-11-19 DIAGNOSIS — C7802 Secondary malignant neoplasm of left lung: Secondary | ICD-10-CM | POA: Diagnosis present

## 2022-11-19 DIAGNOSIS — R530 Neoplastic (malignant) related fatigue: Secondary | ICD-10-CM | POA: Diagnosis present

## 2022-11-19 DIAGNOSIS — M549 Dorsalgia, unspecified: Secondary | ICD-10-CM | POA: Diagnosis present

## 2022-11-19 DIAGNOSIS — Z8619 Personal history of other infectious and parasitic diseases: Secondary | ICD-10-CM

## 2022-11-19 DIAGNOSIS — F4321 Adjustment disorder with depressed mood: Secondary | ICD-10-CM | POA: Diagnosis present

## 2022-11-19 DIAGNOSIS — E785 Hyperlipidemia, unspecified: Secondary | ICD-10-CM | POA: Diagnosis present

## 2022-11-19 DIAGNOSIS — R5383 Other fatigue: Secondary | ICD-10-CM | POA: Diagnosis not present

## 2022-11-19 DIAGNOSIS — N179 Acute kidney failure, unspecified: Secondary | ICD-10-CM | POA: Diagnosis present

## 2022-11-19 DIAGNOSIS — G8314 Monoplegia of lower limb affecting left nondominant side: Secondary | ICD-10-CM | POA: Diagnosis not present

## 2022-11-19 DIAGNOSIS — C228 Malignant neoplasm of liver, primary, unspecified as to type: Secondary | ICD-10-CM | POA: Diagnosis not present

## 2022-11-19 DIAGNOSIS — I6300 Cerebral infarction due to thrombosis of unspecified precerebral artery: Secondary | ICD-10-CM

## 2022-11-19 DIAGNOSIS — G4700 Insomnia, unspecified: Secondary | ICD-10-CM | POA: Diagnosis present

## 2022-11-19 DIAGNOSIS — I480 Paroxysmal atrial fibrillation: Secondary | ICD-10-CM | POA: Diagnosis present

## 2022-11-19 DIAGNOSIS — J9811 Atelectasis: Secondary | ICD-10-CM | POA: Diagnosis not present

## 2022-11-19 DIAGNOSIS — G4733 Obstructive sleep apnea (adult) (pediatric): Secondary | ICD-10-CM | POA: Diagnosis present

## 2022-11-19 DIAGNOSIS — R601 Generalized edema: Secondary | ICD-10-CM | POA: Diagnosis not present

## 2022-11-19 DIAGNOSIS — G936 Cerebral edema: Secondary | ICD-10-CM | POA: Diagnosis not present

## 2022-11-19 DIAGNOSIS — Z79899 Other long term (current) drug therapy: Secondary | ICD-10-CM

## 2022-11-19 DIAGNOSIS — M7989 Other specified soft tissue disorders: Secondary | ICD-10-CM | POA: Diagnosis not present

## 2022-11-19 DIAGNOSIS — Z7982 Long term (current) use of aspirin: Secondary | ICD-10-CM

## 2022-11-19 DIAGNOSIS — E8809 Other disorders of plasma-protein metabolism, not elsewhere classified: Secondary | ICD-10-CM | POA: Diagnosis not present

## 2022-11-19 DIAGNOSIS — Z87891 Personal history of nicotine dependence: Secondary | ICD-10-CM

## 2022-11-19 DIAGNOSIS — E871 Hypo-osmolality and hyponatremia: Secondary | ICD-10-CM | POA: Diagnosis present

## 2022-11-19 DIAGNOSIS — C3490 Malignant neoplasm of unspecified part of unspecified bronchus or lung: Secondary | ICD-10-CM | POA: Diagnosis not present

## 2022-11-19 DIAGNOSIS — I69354 Hemiplegia and hemiparesis following cerebral infarction affecting left non-dominant side: Principal | ICD-10-CM

## 2022-11-19 DIAGNOSIS — Z85528 Personal history of other malignant neoplasm of kidney: Secondary | ICD-10-CM

## 2022-11-19 DIAGNOSIS — R4189 Other symptoms and signs involving cognitive functions and awareness: Secondary | ICD-10-CM | POA: Diagnosis present

## 2022-11-19 DIAGNOSIS — I6930 Unspecified sequelae of cerebral infarction: Secondary | ICD-10-CM | POA: Diagnosis not present

## 2022-11-19 DIAGNOSIS — Z800 Family history of malignant neoplasm of digestive organs: Secondary | ICD-10-CM

## 2022-11-19 DIAGNOSIS — R471 Dysarthria and anarthria: Secondary | ICD-10-CM | POA: Diagnosis not present

## 2022-11-19 DIAGNOSIS — I493 Ventricular premature depolarization: Secondary | ICD-10-CM | POA: Diagnosis present

## 2022-11-19 DIAGNOSIS — R053 Chronic cough: Secondary | ICD-10-CM | POA: Diagnosis not present

## 2022-11-19 DIAGNOSIS — Z6824 Body mass index (BMI) 24.0-24.9, adult: Secondary | ICD-10-CM

## 2022-11-19 DIAGNOSIS — R110 Nausea: Secondary | ICD-10-CM | POA: Diagnosis not present

## 2022-11-19 LAB — CBC
HCT: 42.1 % (ref 39.0–52.0)
Hemoglobin: 13.7 g/dL (ref 13.0–17.0)
MCH: 26 pg (ref 26.0–34.0)
MCHC: 32.5 g/dL (ref 30.0–36.0)
MCV: 79.9 fL — ABNORMAL LOW (ref 80.0–100.0)
Platelets: 359 10*3/uL (ref 150–400)
RBC: 5.27 MIL/uL (ref 4.22–5.81)
RDW: 20.7 % — ABNORMAL HIGH (ref 11.5–15.5)
WBC: 14.8 10*3/uL — ABNORMAL HIGH (ref 4.0–10.5)
nRBC: 0.3 % — ABNORMAL HIGH (ref 0.0–0.2)

## 2022-11-19 LAB — CULTURE, BLOOD (ROUTINE X 2): Culture: NO GROWTH

## 2022-11-19 LAB — BASIC METABOLIC PANEL
Anion gap: 7 (ref 5–15)
BUN: 19 mg/dL (ref 8–23)
CO2: 19 mmol/L — ABNORMAL LOW (ref 22–32)
Calcium: 8 mg/dL — ABNORMAL LOW (ref 8.9–10.3)
Chloride: 102 mmol/L (ref 98–111)
Creatinine, Ser: 1.17 mg/dL (ref 0.61–1.24)
GFR, Estimated: >60 mL/min (ref >60)
Glucose, Bld: 99 mg/dL (ref 70–99)
Potassium: 4.3 mmol/L (ref 3.5–5.1)
Sodium: 128 mmol/L — ABNORMAL LOW (ref 135–145)

## 2022-11-19 LAB — GLUCOSE, CAPILLARY
Glucose-Capillary: 99 mg/dL (ref 70–99)
Glucose-Capillary: 127 mg/dL — ABNORMAL HIGH (ref 70–99)

## 2022-11-19 LAB — CREATININE, SERUM
Creatinine, Ser: 1.21 mg/dL (ref 0.61–1.24)
GFR, Estimated: >60 mL/min (ref >60)

## 2022-11-19 MED ORDER — OXYCODONE-ACETAMINOPHEN 5-325 MG PO TABS
1.0000 | ORAL_TABLET | ORAL | Status: DC | PRN
  Administered 2022-11-20: 2 via ORAL
  Filled 2022-11-19: qty 2

## 2022-11-19 MED ORDER — GUAIFENESIN-DM 100-10 MG/5ML PO SYRP: 10.0000 mL | ORAL_SOLUTION | ORAL | Status: DC | PRN

## 2022-11-19 MED ORDER — POLYETHYLENE GLYCOL 3350 17 G PO PACK: 17.0000 g | PACK | Freq: Every day | ORAL | Status: DC

## 2022-11-19 MED ORDER — ALUM & MAG HYDROXIDE-SIMETH 200-200-20 MG/5ML PO SUSP: 15.0000 mL | ORAL | Status: DC | PRN

## 2022-11-19 MED ORDER — ASPIRIN 81 MG PO CHEW: 81.0000 mg | CHEWABLE_TABLET | Freq: Every day | ORAL | Status: DC

## 2022-11-19 MED ORDER — PANTOPRAZOLE SODIUM 40 MG PO TBEC: 40.0000 mg | DELAYED_RELEASE_TABLET | Freq: Every day | ORAL | Status: DC

## 2022-11-19 MED ORDER — METOCLOPRAMIDE HCL 5 MG PO TABS: 5.0000 mg | ORAL_TABLET | Freq: Three times a day (TID) | ORAL | Status: DC

## 2022-11-19 MED ORDER — METOPROLOL SUCCINATE ER 25 MG PO TB24
12.5000 mg | ORAL_TABLET | Freq: Every day | ORAL | Status: DC
  Administered 2022-11-20 – 2022-11-28 (×9): 12.5 mg via ORAL
  Filled 2022-11-19 (×9): qty 1

## 2022-11-19 MED ORDER — ATORVASTATIN CALCIUM 80 MG PO TABS
80.0000 mg | ORAL_TABLET | Freq: Every day | ORAL | Status: DC
  Administered 2022-11-19 – 2022-11-28 (×10): 80 mg via ORAL
  Filled 2022-11-19 (×10): qty 1

## 2022-11-19 MED ORDER — POLYETHYLENE GLYCOL 3350 17 G PO PACK
17.0000 g | PACK | Freq: Every day | ORAL | Status: DC
  Administered 2022-11-20 – 2022-11-27 (×7): 17 g via ORAL
  Filled 2022-11-19 (×9): qty 1

## 2022-11-19 MED ORDER — SENNA 8.6 MG PO TABS: 1.0000 | ORAL_TABLET | Freq: Every day | ORAL | Status: DC | PRN

## 2022-11-19 MED ORDER — AMLODIPINE BESYLATE 10 MG PO TABS
10.0000 mg | ORAL_TABLET | Freq: Every day | ORAL | Status: DC
  Administered 2022-11-20 – 2022-11-25 (×6): 10 mg via ORAL
  Filled 2022-11-19 (×6): qty 1

## 2022-11-19 MED ORDER — AMLODIPINE BESYLATE 10 MG PO TABS
10.0000 mg | ORAL_TABLET | Freq: Every day | ORAL | Status: DC
  Administered 2022-11-19: 10 mg via ORAL
  Filled 2022-11-19: qty 1

## 2022-11-19 MED ORDER — BENZONATATE 100 MG PO CAPS
100.0000 mg | ORAL_CAPSULE | Freq: Three times a day (TID) | ORAL | Status: DC
  Administered 2022-11-19 – 2022-11-28 (×29): 100 mg via ORAL
  Filled 2022-11-19 (×30): qty 1

## 2022-11-19 MED ORDER — BISACODYL 10 MG RE SUPP: 10.0000 mg | Freq: Every day | RECTAL | Status: DC | PRN

## 2022-11-19 MED ORDER — BENZONATATE 100 MG PO CAPS
100.0000 mg | ORAL_CAPSULE | Freq: Three times a day (TID) | ORAL | Status: DC
  Administered 2022-11-19: 100 mg via ORAL
  Filled 2022-11-19: qty 1

## 2022-11-19 MED ORDER — GUAIFENESIN-DM 100-10 MG/5ML PO SYRP
10.0000 mL | ORAL_SOLUTION | ORAL | Status: DC | PRN
  Administered 2022-11-19: 10 mL via ORAL
  Filled 2022-11-19: qty 10

## 2022-11-19 MED ORDER — METOPROLOL SUCCINATE ER 25 MG PO TB24
12.5000 mg | ORAL_TABLET | Freq: Every day | ORAL | Status: DC
  Administered 2022-11-19: 12.5 mg via ORAL
  Filled 2022-11-19: qty 1

## 2022-11-19 MED ORDER — PANTOPRAZOLE SODIUM 40 MG PO TBEC
40.0000 mg | DELAYED_RELEASE_TABLET | Freq: Every day | ORAL | Status: DC
  Administered 2022-11-20 – 2022-11-28 (×9): 40 mg via ORAL
  Filled 2022-11-19 (×9): qty 1

## 2022-11-19 MED ORDER — BOOST / RESOURCE BREEZE PO LIQD CUSTOM
1.0000 | Freq: Three times a day (TID) | ORAL | Status: DC
  Administered 2022-11-19 – 2022-11-28 (×19): 1 via ORAL

## 2022-11-19 MED ORDER — METOCLOPRAMIDE HCL 5 MG PO TABS
5.0000 mg | ORAL_TABLET | Freq: Three times a day (TID) | ORAL | Status: DC
  Administered 2022-11-19 – 2022-11-28 (×28): 5 mg via ORAL
  Filled 2022-11-19 (×28): qty 1

## 2022-11-19 MED ORDER — LOPERAMIDE HCL 2 MG PO CAPS: 2.0000 mg | ORAL_CAPSULE | ORAL | Status: DC | PRN

## 2022-11-19 MED ORDER — HEPARIN SODIUM (PORCINE) 5000 UNIT/ML IJ SOLN
5000.0000 [IU] | Freq: Three times a day (TID) | INTRAMUSCULAR | Status: DC
  Administered 2022-11-19 – 2022-11-28 (×29): 5000 [IU] via SUBCUTANEOUS
  Filled 2022-11-19 (×29): qty 1

## 2022-11-19 MED ORDER — GUAIFENESIN-DM 100-10 MG/5ML PO SYRP
10.0000 mL | ORAL_SOLUTION | ORAL | Status: DC | PRN
  Administered 2022-11-19 – 2022-11-24 (×3): 10 mL via ORAL
  Filled 2022-11-19 (×3): qty 10

## 2022-11-19 MED ORDER — METOPROLOL SUCCINATE ER 25 MG PO TB24: 12.5000 mg | ORAL_TABLET | Freq: Every day | ORAL | Status: DC

## 2022-11-19 MED ORDER — METOCLOPRAMIDE HCL 5 MG PO TABS
5.0000 mg | ORAL_TABLET | Freq: Three times a day (TID) | ORAL | Status: DC
  Administered 2022-11-19: 5 mg via ORAL
  Filled 2022-11-19: qty 1

## 2022-11-19 MED ORDER — DOCUSATE SODIUM 100 MG PO CAPS
100.0000 mg | ORAL_CAPSULE | Freq: Every day | ORAL | Status: DC
  Administered 2022-11-20 – 2022-11-28 (×8): 100 mg via ORAL
  Filled 2022-11-19 (×9): qty 1

## 2022-11-19 MED ORDER — ASPIRIN 81 MG PO CHEW
81.0000 mg | CHEWABLE_TABLET | Freq: Every day | ORAL | Status: DC
  Administered 2022-11-20 – 2022-11-28 (×9): 81 mg via ORAL
  Filled 2022-11-19 (×9): qty 1

## 2022-11-19 MED ORDER — DOCUSATE SODIUM 100 MG PO CAPS
100.0000 mg | ORAL_CAPSULE | Freq: Two times a day (BID) | ORAL | Status: DC | PRN
  Filled 2022-11-19: qty 1

## 2022-11-19 MED ORDER — AMLODIPINE BESYLATE 10 MG PO TABS: 10.0000 mg | ORAL_TABLET | Freq: Every day | ORAL | Status: DC

## 2022-11-19 NOTE — Progress Notes (Signed)
Daily Progress Note   Patient Name: Mathew [name]       Date: 11/19/2022 DOB: 1951/05/04  Age: 72 y.o. MRN#: [id number] Attending Physician: Noralee Stain, DO Primary Care Physician: Patient, No Pcp Per Admit Date: 10/30/2022  Reason for Consultation/Follow-up: Establishing goals of care  Patient Profile/HPI:  72 y.o. male  with past medical history of stage IVa hepatocellular cancer (more than one tumor, no distant metastasis) was on chemotherapy prior to admission, a-fib, HTN, Hep C post tx, admitted on 10/30/2022 with aortic rupture requiring endovascular repair by vascular surgery. His recovery has been complicated by L sided weakness and MRI brain confirmed multiple embolic infarcts, and MRI C-spine showed R side cord ischemia. Palliative consulted for advanced care planning.    Subjective: Chart reviewed including labs, progress notes, imaging from this and previous encounters.  Patient awake and alert. Appears to be feeling improved today. No symptom complaints. Getting discharged to CIR today.  He had discussion with Dr. Mosetta Putt and changed his code status to DNR.   Review of Systems  Musculoskeletal:  Positive for neck pain.     Physical Exam Vitals and nursing note reviewed.  Neurological:     Mental Status: He is alert and oriented to person, place, and time.             Vital Signs: BP (!) 156/92 (BP Location: Right Arm)   Pulse (!) 101   Temp 98.5 F (36.9 C) (Oral)   Resp 18   Ht 6\' 3"  (1.905 m)   Wt 87.8 kg   SpO2 96%   BMI 24.20 kg/m  SpO2: SpO2: 96 % O2 Device: O2 Device: Room Air O2 Flow Rate: O2 Flow Rate (L/min): 4 L/min  Intake/output summary:  Intake/Output Summary (Last 24 hours) at 11/19/2022 1348 Last data filed at 11/19/2022 0600 Gross per 24 hour   Intake 240 ml  Output 1000 ml  Net -760 ml   LBM: Last BM Date : 11/18/22 Baseline Weight: Weight: 84.8 kg Most recent weight: Weight: 87.8 kg       Palliative Assessment/Data: PPS: 50%      Patient Active Problem List   Diagnosis Date Noted   CVA (cerebral vascular accident) (HCC) 11/13/2022   Malnutrition of moderate degree 11/04/2022   Acute thoracic aortic dissection (HCC) 10/30/2022   Ascending  aortic aneurysm, unspecified whether ruptured (HCC) 10/30/2022   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   Normal anion gap metabolic acidosis 10/30/2022   Malignant hypertension 10/28/2022   AKI (acute kidney injury) (HCC) 10/27/2022   Hypomagnesemia 10/27/2022   Metastatic malignant neoplasm (HCC) 10/26/2022   Dissection of descending thoracic aorta (HCC) 10/25/2022   Hepatocellular carcinoma (HCC) 09/03/2022   Liver mass 08/21/2022   Medication side effect 10/16/2021   IPF (idiopathic pulmonary fibrosis) (HCC) 08/17/2021   Smoker 08/17/2021    Palliative Care Assessment & Plan    Assessment/Recommendations/Plan  Continue current plan of care To d/c to CIR and then possibly home with hospice   Code Status: DNR  Prognosis:  Unable to determine  Discharge Planning: CIR  Care plan was discussed with patient and care team.   Thank you for allowing the Palliative Medicine Team to assist in the care of this patient.   Greater than 50%  of this time was spent counseling and coordinating care related to the above assessment and plan.  Mathew [name], AGNP-C Palliative Medicine   Please contact Palliative Medicine Team phone at [phone] for questions and concerns.

## 2022-11-19 NOTE — H&P (Signed)
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  Per chart review patient lives with spouse.  Two-level home bed bath main level 2 steps to entry.  Independent and working prior to admission.  Presented 10/25/2022 with chest pain radiating to the abdomen.  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  He was started on nitroglycerin infusion.  Given need for tight hemodynamic control PCCM consulted for further management and admission.  VVS and cardiology consulted.  Initially required Esmolol drip, weaned off with the addition of multiple p.o. medications.  Blood pressures normalized and patient was deemed stable for discharge 10/29/2022 due to no expansion of dissection ultimately returning on  6/26 for worsening pain or imaging confirmed aortic rupture requiring endovascular repair by vascular surgery Dr. Chestine Spore.  Hospital course 11/01/2022 patient with left lower extremity weakness of acute onset.  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  CT angiogram head and neck showed no acute intracranial process no intracranial large vessel occlusion or significant stenosis.  No hemodynamically significant stenosis in the carotid and vertebral arteries.  Neurology follow-up and patient was cleared to begin low-dose aspirin.  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 Mosetta Putt and patient not felt to be a candidate for further cancer treatments at this time and patient is currently DNR.  Mild AKI improved with gentle IV fluids latest creatinine 1.17.  Patient was cleared to begin subcutaneous heparin for DVT prophylaxis.  Palliative care was consulted to establish goals of care.  His diet has been advanced to regular consistency.  Therapy evaluations completed due to patient's deconditioning as well as left-sided weakness was admitted for a comprehensive rehab program.     Pt reports LBM yesterday- denies constipation Always has urinary leakage- ever since prostate surgery 20 years ago -prostatectomy.  Having pain in back of neck when stands up as well as weakness in Arms when stands up Always weak since aneurysm surgery in LE's- LLE worse than RLE.  Skin hurts "all over" Using purewick to void-  Meds for coughing were justs tarted- very helpful.  Nausea better with scheduled nausea meds, but not resolved.    Of note, Albumin <1.5     Review of Systems  Constitutional:  Positive for fever.  HENT:  Negative for hearing loss.   Eyes:  Negative for blurred vision and double vision.  Respiratory:  Negative for cough, shortness of breath and wheezing.   Cardiovascular:  Positive for leg swelling. Negative for chest pain and palpitations.  Gastrointestinal:  Positive for abdominal pain. Negative for constipation, diarrhea, nausea and vomiting.  Genitourinary:  Positive for frequency and urgency. Negative for dysuria, flank pain and hematuria.  Musculoskeletal:  Positive for joint pain and myalgias.  Skin:  Negative for rash.  Neurological:  Positive for focal weakness and weakness. Negative for tingling,  tremors and sensory change.  Psychiatric/Behavioral:  Positive for depression. Negative for substance abuse and suicidal ideas.   All other systems reviewed and are negative.       Past Medical History:  Diagnosis Date   Hepatitis C       s/p treatment   Hypertension     Obstructive sleep apnea      unable to tolerate CPAP   Premature ventricular contraction     Prostate cancer (HCC) 2006             Past Surgical History:  Procedure Laterality Date   LOOP RECORDER INSERTION N/A 07/04/2016    Procedure: Loop Recorder Insertion;  Surgeon: Hillis Range, MD;  Location: [facility];  Service: Cardiovascular;  Laterality: N/A;   prostectomy       SPLENECTOMY, TOTAL       TEE WITHOUT CARDIOVERSION   10/30/2022    Procedure: TRANSESOPHAGEAL ECHOCARDIOGRAM;  Surgeon: Nada Libman, MD;  Location: [facility] OR;  Service: Vascular;;   THORACIC AORTIC ENDOVASCULAR STENT GRAFT N/A 10/30/2022    Procedure: THORACIC AORTIC ENDOVASCULAR STENT GRAFT with Left Subclavian artery revascularization with laser.;  Surgeon: Nada Libman, MD;  Location: [facility] OR;  Service: Vascular;  Laterality: N/A;             Family History  Problem Relation Age of Onset   Cancer Brother          colon cancer and prostate cancer   Heart attack Neg Hx          Social History:  reports that he quit smoking about 3 months ago. His smoking use included cigarettes. He started smoking about 40 years ago. He has a 40 pack-year smoking history. He has been exposed to tobacco smoke. He has never used smokeless tobacco. He reports current alcohol use of about 2.0 standard drinks of alcohol per week. He reports that he does not use drugs. Allergies:  Allergies  No Known Allergies         Medications Prior to Admission  Medication Sig Dispense Refill   atorvastatin (LIPITOR) 80 MG tablet Take 1 tablet (80 mg total) by mouth at bedtime. 30 tablet 2   carvedilol (COREG) 25 MG tablet Take 1 tablet (25 mg total) by mouth 2 (two) times daily with a meal. 60 tablet 2   escitalopram (LEXAPRO) 10 MG tablet Take 10 mg by mouth daily.       isosorbide mononitrate (IMDUR) 60 MG 24 hr tablet Take 1 tablet (60 mg total) by mouth daily. 30 tablet 2   losartan (COZAAR)  100 MG tablet Take 1 tablet (100 mg total) by mouth daily. 30 tablet 2   scopolamine (TRANSDERM-SCOP) 1 MG/3DAYS Place 1 patch (1.5 mg total) onto the skin every 3 (three) days. 10 patch 12   spironolactone (ALDACTONE) 50 MG tablet Take 1 tablet (50 mg total) by mouth daily. 30 tablet 2   valACYclovir (VALTREX) 500 MG tablet Take 500 mg by mouth daily as needed (cold sores flare ups).       amLODipine (NORVASC) 10 MG tablet Take 1 tablet (10 mg total) by mouth daily. 30 tablet 2   amLODipine (NORVASC) 5 MG tablet Take 5 mg by mouth daily.       Apixaban Starter Pack, 10mg  and 5mg , (ELIQUIS DVT/PE STARTER PACK) Take by mouth. Take as directed on package: start with two-5mg  tablets twice daily for 7 days. On day 8, switch to one-5mg  tablet twice daily. (  Patient not taking: Reported on 11/09/2022)       dronabinol (MARINOL) 5 MG capsule Take 5 mg by mouth 2 (two) times daily before a meal.                  Home: Home Living Family/patient expects to be discharged to:: Skilled nursing facility Living Arrangements: Spouse/significant other Available Help at Discharge: Family, Available PRN/intermittently (can work toward 24/7 assist) Type of Home: House Home Access: Ramped entrance, Stairs to enter [employer] of Steps: 2 Entrance Stairs-Rails: None Home Layout: Two level, Able to live on main level with bedroom/bathroom, Bed/bath upstairs (only half bath is downstairs) Alternate Level Stairs-Number of Steps: flight Alternate Level Stairs-Rails: Can reach both Bathroom Shower/Tub: Health visitor: Standard Home Equipment: None  Lives With: Spouse   Functional History: Prior Function Prior Level of Function : Independent/Modified Independent, Working/employed, Driving (drove truck) Mobility Comments: no AD use ADLs Comments: ind   Functional Status:  Mobility: Bed Mobility Overal bed mobility: Needs Assistance Bed Mobility: Supine to Sit, Sit to  Supine Rolling: Min guard Sidelying to sit: Min assist Supine to sit: Min guard, HOB elevated Sit to supine: Min guard Sit to sidelying: Max assist, +2 for physical assistance General bed mobility comments: increased time and rest breaks, but no physical assist needed Transfers Overall transfer level: Needs assistance Equipment used: Rolling walker (2 wheels) Transfers: Sit to/from Stand, Bed to chair/wheelchair/BSC Sit to Stand: Min assist, Min guard, From elevated surface Bed to/from chair/wheelchair/BSC transfer type:: Step pivot Squat pivot transfers: Mod assist Step pivot transfers: Min guard Transfer via Lift Equipment: Stedy General transfer comment: From elevated EOB x2 and BSC x1 with min A for power up progressing to min guard with elevated surface. Cues for safety due to pt releasing RW and sitting sideways to EOB before fully turning around despite cues Ambulation/Gait Ambulation/Gait assistance: Min assist Gait Distance (Feet): 50 Feet Assistive device: Rolling walker (2 wheels) Gait Pattern/deviations: Step-through pattern, Decreased stride length, Trunk flexed General Gait Details: Pt demonstrating slow step-through pattern with short steps. Cues and min A for upright posture and RW proximity due to increased trunk flexion with progressed distance. Pre-gait activities: pt steps from bedside commode to chair, tends to sink into knee and hip flexion with stepping   ADL: ADL Overall ADL's : Needs assistance/impaired Eating/Feeding: Moderate assistance, Sitting, Bed level Grooming: Set up, Sitting, Wash/dry face Upper Body Bathing: Moderate assistance, Sitting, Bed level Lower Body Bathing: Maximal assistance, Sit to/from stand Lower Body Bathing Details (indicate cue type and reason): assistance with peri area back while standing Upper Body Dressing : Moderate assistance, Sitting, Bed level Lower Body Dressing: Maximal assistance, Sitting/lateral leans, Bed level Toilet  Transfer: Maximal assistance Toilet Transfer Details (indicate cue type and reason): with use of RW, mod A for [employer]- Clothing Manipulation and Hygiene: Total assistance Functional mobility during ADLs: Moderate assistance   Cognition: Cognition Overall Cognitive Status: Difficult to assess Arousal/Alertness: Awake/alert Orientation Level: Oriented X4 Year: 2024 Month: June Day of Week: Correct Attention: Sustained Sustained Attention: Impaired Sustained Attention Impairment: Verbal complex Memory: Impaired Memory Impairment: Storage deficit Problem Solving: Impaired Problem Solving Impairment: Verbal complex, Functional basic Executive Function: Landscape architect: Impaired Organizing Impairment: Verbal basic, Verbal complex Cognition Arousal/Alertness: Awake/alert Behavior During Therapy: Flat affect Overall Cognitive Status: Difficult to assess Area of Impairment: Following commands Orientation Level: Situation Following Commands: Follows one step commands with increased time General Comments: Pt required repetition of most questions and  cues and demonstrates some impairment in short-term memory as he did not recall education on neck ROM and exercises from previous OT session.   Physical Exam: Blood pressure (!) 155/91, pulse (!) 102, temperature 98.7 F (37.1 C), temperature source Oral, resp. rate 20, height 6\' 3"  (1.905 m), weight 87.8 kg, SpO2 96%. Physical Exam Vitals and nursing note reviewed. Exam conducted with a chaperone present.  Constitutional:      Appearance: Normal appearance. He is normal weight.     Comments: Pt sitting up in bed; godson at bedside- wife came in at end; rehab nursing getting ready to move to CIR; awake, alert, quiet, NAD  HENT:     Head: Normocephalic and atraumatic.     Comments: No facial droop seen- didn't want to smile Tongue midline    Right Ear: External ear normal.     Left Ear: External ear normal.     Nose: Nose  normal. No congestion.     Mouth/Throat:     Mouth: Mucous membranes are dry.     Pharynx: Oropharynx is clear. No oropharyngeal exudate.  Eyes:     General:        Right eye: No discharge.        Left eye: No discharge.     Extraocular Movements: Extraocular movements intact.  Neck:     Comments: Ttp with some tight musculature in neck- esp upper traps and scalenes Cardiovascular:     Rate and Rhythm: Normal rate and regular rhythm.     Heart sounds: Normal heart sounds. No murmur heard.    No gallop.  Pulmonary:     Effort: Pulmonary effort is normal. No respiratory distress.     Breath sounds: Normal breath sounds. No wheezing, rhonchi or rales.  Abdominal:     General: Bowel sounds are normal. There is no distension.     Palpations: Abdomen is soft.     Tenderness: There is no abdominal tenderness.  Genitourinary:    Comments: Wearing male purewick- urine on darker side/almost medium beer color Musculoskeletal:     Cervical back: Neck supple.     Comments: RUE- 5/5 in biceps,t riceps, grip and FA LUE- slightly weaker- 5-/5 in same muscles RLE- HF 4/5; KE/KF 4+/5; DF/PF 5-/5 LLE-  HF 3-/5; KE 4/5; DF/PF 4+/5  Skin:    General: Skin is warm and dry.     Comments: Infiltrated IV that's been removed in L AC fossa- large pore opening appearance- maybe ~ 3-4 mm opening- with associate slough- and appears to be slightly warm and maybe cellulitis R forearm IV looks OK  Neurological:     Comments: Patient is alert however appears fatigued.  Oriented x 3 and follows commands Intact to light touch in all  4 extremities   Psychiatric:     Comments: Very flat affect- slightly delayed responses        Lab Results Last 48 Hours        Results for orders placed or performed during the hospital encounter of 10/30/22 (from the past 48 hour(s))  Glucose, capillary     Status: Abnormal    Collection Time: 11/17/22  4:53 PM  Result Value Ref Range    Glucose-Capillary 106 (H) 70 - 99  mg/dL      Comment: Glucose reference range applies only to samples taken after fasting for at least 8 hours.  Glucose, capillary     Status: Abnormal    Collection Time: 11/17/22  7:57 PM  Result Value Ref Range    Glucose-Capillary 118 (H) 70 - 99 mg/dL      Comment: Glucose reference range applies only to samples taken after fasting for at least 8 hours.  Glucose, capillary     Status: Abnormal    Collection Time: 11/18/22 12:00 AM  Result Value Ref Range    Glucose-Capillary 111 (H) 70 - 99 mg/dL      Comment: Glucose reference range applies only to samples taken after fasting for at least 8 hours.  Basic metabolic panel     Status: Abnormal    Collection Time: 11/18/22  1:41 AM  Result Value Ref Range    Sodium 129 (L) 135 - 145 mmol/L    Potassium 4.5 3.5 - 5.1 mmol/L    Chloride 101 98 - 111 mmol/L    CO2 19 (L) 22 - 32 mmol/L    Glucose, Bld 98 70 - 99 mg/dL      Comment: Glucose reference range applies only to samples taken after fasting for at least 8 hours.    BUN 22 8 - 23 mg/dL    Creatinine, Ser 9.52 (H) 0.61 - 1.24 mg/dL    Calcium 8.0 (L) 8.9 - 10.3 mg/dL    GFR, Estimated 59 (L) >60 mL/min      Comment: (NOTE) Calculated using the CKD-EPI Creatinine Equation (2021)      Anion gap 9 5 - 15      Comment: Performed at [facility], [address], [place], [place] [address]  CBC     Status: Abnormal    Collection Time: 11/18/22  1:41 AM  Result Value Ref Range    WBC 13.3 (H) 4.0 - 10.5 K/uL    RBC 4.95 4.22 - 5.81 MIL/uL    Hemoglobin 12.7 (L) 13.0 - 17.0 g/dL    HCT 44.0 10.2 - 72.5 %    MCV 78.8 (L) 80.0 - 100.0 fL    MCH 25.7 (L) 26.0 - 34.0 pg    MCHC 32.6 30.0 - 36.0 g/dL    RDW 36.6 (H) 44.0 - 15.5 %    Platelets 430 (H) 150 - 400 K/uL    nRBC 0.4 (H) 0.0 - 0.2 %      Comment: Performed at [facility], [address], [place], [place] [address]  Glucose, capillary     Status: None    Collection Time: 11/18/22  3:53 AM  Result Value Ref  Range    Glucose-Capillary 96 70 - 99 mg/dL      Comment: Glucose reference range applies only to samples taken after fasting for at least 8 hours.  Glucose, capillary     Status: Abnormal    Collection Time: 11/18/22  8:15 AM  Result Value Ref Range    Glucose-Capillary 100 (H) 70 - 99 mg/dL      Comment: Glucose reference range applies only to samples taken after fasting for at least 8 hours.  Glucose, capillary     Status: None    Collection Time: 11/18/22 11:50 AM  Result Value Ref Range    Glucose-Capillary 93 70 - 99 mg/dL      Comment: Glucose reference range applies only to samples taken after fasting for at least 8 hours.  Glucose, capillary     Status: None    Collection Time: 11/18/22  4:12 PM  Result Value Ref Range    Glucose-Capillary 96 70 - 99 mg/dL      Comment:  Glucose reference range applies only to samples taken after fasting for at least 8 hours.  Glucose, capillary     Status: None    Collection Time: 11/18/22  8:01 PM  Result Value Ref Range    Glucose-Capillary 99 70 - 99 mg/dL      Comment: Glucose reference range applies only to samples taken after fasting for at least 8 hours.  Glucose, capillary     Status: None    Collection Time: 11/18/22 11:32 PM  Result Value Ref Range    Glucose-Capillary 96 70 - 99 mg/dL      Comment: Glucose reference range applies only to samples taken after fasting for at least 8 hours.  Basic metabolic panel     Status: Abnormal    Collection Time: 11/19/22  3:26 AM  Result Value Ref Range    Sodium 128 (L) 135 - 145 mmol/L    Potassium 4.3 3.5 - 5.1 mmol/L    Chloride 102 98 - 111 mmol/L    CO2 19 (L) 22 - 32 mmol/L    Glucose, Bld 99 70 - 99 mg/dL      Comment: Glucose reference range applies only to samples taken after fasting for at least 8 hours.    BUN 19 8 - 23 mg/dL    Creatinine, Ser 1.61 0.61 - 1.24 mg/dL    Calcium 8.0 (L) 8.9 - 10.3 mg/dL    GFR, Estimated >09 >60 mL/min      Comment: (NOTE) Calculated using  the CKD-EPI Creatinine Equation (2021)      Anion gap 7 5 - 15      Comment: Performed at [facility], [address], [place], [place] [address]  Glucose, capillary     Status: None    Collection Time: 11/19/22  3:49 AM  Result Value Ref Range    Glucose-Capillary 99 70 - 99 mg/dL      Comment: Glucose reference range applies only to samples taken after fasting for at least 8 hours.  Glucose, capillary     Status: Abnormal    Collection Time: 11/19/22  9:00 AM  Result Value Ref Range    Glucose-Capillary 127 (H) 70 - 99 mg/dL      Comment: Glucose reference range applies only to samples taken after fasting for at least 8 hours.      Imaging Results (Last 48 hours)  No results found.         Blood pressure (!) 155/91, pulse (!) 102, temperature 98.7 F (37.1 C), temperature source Oral, resp. rate 20, height 6\' 3"  (1.905 m), weight 87.8 kg, SpO2 96%.   Medical Problem List and Plan: 1. 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  Antithrombotics: -DVT/anticoagulation:  Pharmaceutical: Heparin             -antiplatelet therapy: Aspirin 81 mg daily 3. Pain Management: Oxycodone as needed- pt c/o "skin hurting"- might be worth doing nerve pain meds? 4. Mood/Behavior/Sleep: Provide emotional support             -antipsychotic agents: N/A 5. Neuropsych/cognition: This patient is capable of making decisions on his own behalf.  6. Skin/Wound Care: Routine skin checks 7. Fluids/Electrolytes/Nutrition: Routine in and outs with follow-up chemistries 8.  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  Follow-up outpatient Dr. Mosetta Putt.  Currently not a candidate for more cancer treatment 9.  History of  prostate cancer with prostatectomy.  Follow-up outpatient- pt has had leakage for 20 years since surgery done- wears briefs 10.  AKI.  Follow-up chemistries. 11.  Hypertension.  Norvasc 10 mg daily.,  Toprol-XL 12.5 mg daily 12.  Hyperlipidemia.  Lipitor 13.  History of tobacco use.  Quit smoking 3 months ago.  Provide counseling 14. DNR- made DNR and plans for outpt with hospice when discharged from CIR.  15. Chronic nausea- on oral meds- helpful, but not resolved 16. Cough- notes better with new cough meds. Will monitor for any worsening of Sx's.    Mcarthur Rossetti Angiulli, PA-C 11/19/2022     I have personally performed a face to face diagnostic evaluation of this patient and formulated the key components of the plan.  Additionally, I have personally reviewed laboratory data, imaging studies, as well as relevant notes and concur with the physician assistant's documentation above.   The patient's status has not changed from the original H&P.  Any changes in documentation from the acute care chart have been noted above.

## 2022-11-19 NOTE — Discharge Summary (Signed)
Physician Discharge Summary  Mathew [name] ZOX:[id number] DOB: 26-Apr-1951 DOA: 10/30/2022  PCP: Patient, No Pcp Per  Admit date: 10/30/2022 Discharge date: 11/19/2022  Admitted From: Home Disposition:  CIR. Recommend home hospice on discharge from CIR.   Discharge Condition: Stable, but with overall poor prognosis CODE STATUS: DNR  Diet recommendation: Heart healthy   Brief/Interim Summary: Mathew [name] is a 72 year old male with PMHx significant for HCC on chemotherapy, paroxysmal atrial fibrillation, hypertension, and hepatitis C status post treatment.  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   Hospital Course: 6/25 Discharged with stable type B Ao dissection with intramural thrombosis  6/26 Returned for admittance with ascending Ao rupture. Emergent OR for repair 6/27 Patient developed left-sided weakness, MRI brain noted for multiple acute infarcts with embolic pattern, MRI C-spine with cord ischemia on the right side.   6/28 Lumbar drain placed 6/30 Started on vasopressor support to maintain SBP greater than 140 7/1 Vascular surgery continue plans to continue lumbar drain  7/2 Nausea with emesis, . Increased O2 requirement with faint bibasilar crackles. CXR + Lasix x 1. Lumbar drain removed. 7/4 Coretrak clogged -ultimately removed as patient is tolerating p.o. 7/5 Continues to progress with PT 7/8 Discussion with Oncology reveals no real therapy options - palliative care consulted 7/9 Dr. Natale Milch discussed goals of care with patient and wife at bedside, plan for palliative care  meeting 7/10 Family meeting with palliative care --> Full scope of care, wants to go to CIR  7/11 Fever 100.4  7/12 Severe hypoglycemic episode CBG <10  7/13 Agreeable to DNR, treat the treatable, wants to go to CIR, then home with hospice  7/16 Afebrile, blood sugar stable, discharge to CIR today   Discharge Diagnoses:   Principal Problem:   Acute thoracic aortic dissection (HCC) Active Problems:   Hepatocellular carcinoma (HCC)   Dissection of descending thoracic aorta (HCC)   AKI (acute kidney injury) (HCC)   Ascending aortic aneurysm, unspecified whether ruptured (HCC)   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)   Normal anion gap metabolic acidosis   Malnutrition of moderate degree   CVA (cerebral vascular accident) (HCC)     Rupture type B aortic dissection status post emergent T EVAR complicated by spinal cord ischemia, embolic stroke -Vascular surgery following   Acute bilateral multifocal embolic/ischemic strokes -MRI Brain showed bilateral multifocal embolic ischemic stroke -Continues to improve with ongoing physical therapy -Continue aspirin, lipitor    Cervical spinal cord edema, concern for infarct Ambulatory dysfunction -MRI C-spine showing spinal cord signal in the C6-C7, edema versus stroke.  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  -On atezolizumab plus bevacizumab every 3 weeks prior to admission.  -Will need outpatient follow-up with oncology. Atezolizumab has been associated with aortic dissection previously per FDA drug information sheet (combined in the 6% fatal adverse outcomes associated with the drug). Retrospective trials have also shown associations with dissections associated with bevacizumab, but the FDA has not included this in the drug information sheet to date.  -Per Dr. Mosetta Putt, patient is unlikely  to be a candidate for more  cancer treatments with his stroke and aortic dissection.  Overall prognosis is poor, likely 3 to 6 months   Acute transient metabolic encephalopathy, resolved Acute kidney injury likely ATN in the setting of hemodynamic insults, resolved  Questionable COPD exacerbation, resolved  Prolonged history of tobacco abuse   Goals of care -Appreciate palliative care medicine   SIRS, not POA -Fever 100.4 overnight 7/10 to 7/11 associated with tachycardia and tachypnea, oxygen desaturation down to 88% requiring 2 L nasal cannula O2 -Blood cultures ordered -Chest x-ray reviewed independently, stable from previous examination with minimal atelectasis, no focal infiltrate -COVID test negative -UA unremarkable -WBC elevated, although has been elevated previously and is not any worse, continue to watch fever curve -Monitor off antibiotics -Afebrile >48 hours.  No sign of infection   Severe hypoglycemia with metabolic encephalopathy -Encephalopathy has resolved now that his blood sugar is within acceptable range -Unclear why he had hypoglycemic episode.  He does have diagnosis of prediabetes, is not on insulin in the hospital -Stop IV fluid, continue CBG checks, encourage p.o. intake -Blood sugars remained stable off IV fluid   Non-severe malnutrition -Albumin < 1.5 -Appreciate dietitian    Nausea and vomiting -Scheduled Zofran and Reglan for symptom management -Improved    Discharge Instructions  Discharge Instructions     Increase activity slowly   Complete by: As directed       Allergies as of 11/19/2022   No Known Allergies      Medication List     STOP taking these medications    carvedilol 25 MG tablet Commonly known as: COREG   dronabinol 5 MG capsule Commonly known as: MARINOL   Eliquis DVT/PE Starter Pack Generic drug: Apixaban Starter Pack (10mg  and 5mg )   escitalopram 10 MG tablet Commonly known as: LEXAPRO   isosorbide mononitrate 60 MG 24 hr  tablet Commonly known as: IMDUR   losartan 100 MG tablet Commonly known as: COZAAR   spironolactone 50 MG tablet Commonly known as: ALDACTONE       TAKE these medications    amLODipine 10 MG tablet Commonly known as: NORVASC Take 1 tablet (10 mg total) by mouth daily. Start taking on: November 20, 2022 What changed: Another medication with the same name was removed. Continue taking this medication, and follow the directions you see here.   aspirin 81 MG chewable tablet Chew 1 tablet (81 mg total) by mouth daily. Start taking on: November 20, 2022   atorvastatin 80 MG tablet Commonly known as: LIPITOR Take 1 tablet (80 mg total) by mouth at bedtime.   guaiFENesin-dextromethorphan 100-10 MG/5ML syrup Commonly known as: ROBITUSSIN DM Take 10 mLs by mouth every 4 (four) hours as needed for cough.   metoCLOPramide 5 MG tablet Commonly known as: REGLAN Take 1 tablet (5 mg total) by mouth 3 (three) times daily before meals.   metoprolol succinate 25 MG 24 hr tablet Commonly known as: TOPROL-XL Take 0.5 tablets (12.5 mg total) by mouth daily. Start taking on: November 20, 2022   pantoprazole 40 MG tablet Commonly known as: PROTONIX Take 1 tablet (40 mg total) by mouth daily. Start taking on: November 20, 2022   polyethylene glycol 17 g packet Commonly known as: MIRALAX / GLYCOLAX Take 17 g by mouth daily. Start taking on: November 20, 2022   scopolamine 1 MG/3DAYS Commonly known as: TRANSDERM-SCOP Place 1 patch (1.5 mg total) onto the skin every 3 (three) days.   valACYclovir 500 MG  tablet Commonly known as: VALTREX Take 500 mg by mouth daily as needed (cold sores flare ups).        Follow-up Information     Nada Libman, MD Follow up in 4 week(s).   Specialties: Vascular Surgery, Cardiology Contact information: [address] [place] [address] [phone]         Malachy Mood, MD Follow up.   Specialties: Hematology, Oncology Contact information: [address] [place] [address] [phone]                No Known Allergies   Procedures/Studies: DG Abd 1 View  Result Date: 11/14/2022 CLINICAL DATA:  Loss of appetite for several days, initial encounter EXAM: ABDOMEN - 1 VIEW COMPARISON:  11/05/2022 FINDINGS: Gastric catheter has been removed. Scattered large and small bowel gas is noted. No obstructive changes are seen. No free air is noted. No acute bony abnormality is seen. IMPRESSION: No acute abnormality noted. Electronically Signed   By: Alcide Clever M.D.   On: 11/14/2022 11:43   DG CHEST PORT 1 VIEW  Result Date: 11/14/2022 CLINICAL DATA:  Fever and loss of appetite, initial encounter EXAM: PORTABLE CHEST 1 VIEW COMPARISON:  11/05/2022 FINDINGS: Cardiac shadow is stable. Endovascular stent graft is again seen and stable. Mild atelectatic changes are again noted in the bases. No new focal infiltrate is seen. No bony abnormality is noted. IMPRESSION: Stable basilar atelectasis. Electronically Signed   By: Alcide Clever M.D.   On: 11/14/2022 11:42   DG Abd 1 View  Result Date: 11/05/2022 CLINICAL DATA:  Nasogastric tube placement EXAM: ABDOMEN - 1 VIEW COMPARISON:  11/05/2022 at 2:20 p.m. FINDINGS: A feeding tube is present coils once in the stomach with tip in the stomach body oriented towards the cardia. Nasogastric tube side port and tip are in the stomach body. Descending thoracic aortic stents noted. Left greater than right basilar airspace opacities, pneumonia is not excluded. IMPRESSION: 1. Feeding tube coils once in the stomach with tip in the stomach body oriented towards the cardia. 2. Nasogastric tube side port and tip in the stomach body. 3. Left greater than right basilar airspace opacities, pneumonia is not excluded. Electronically Signed   By: Gaylyn Rong M.D.   On: 11/05/2022 19:51   DG Abd 1 View  Result Date: 11/05/2022 CLINICAL DATA:  Abdominal distention EXAM: ABDOMEN - 1 VIEW COMPARISON:  None  Available. FINDINGS: Nonobstructive pattern of bowel gas with gas present to the rectum. Scattered stool in the left colon. No obvious free air on supine radiograph. Probable enteric feeding tube, incompletely imaged at the upper extent of the exam. IMPRESSION: Nonobstructive pattern of bowel gas with gas present to the rectum. Scattered stool in the left colon. Electronically Signed   By: Jearld Lesch M.D.   On: 11/05/2022 15:14   DG CHEST PORT 1 VIEW  Result Date: 11/05/2022 CLINICAL DATA:  Shortness of breath EXAM: PORTABLE CHEST 1 VIEW COMPARISON:  10/30/2022 FINDINGS: Redemonstrated endoluminal stent within the aortic arch and descending thoracic aorta. Unchanged cardiac and mediastinal contours. Redemonstrated interstitial and ground-glass opacities. Likely trace right pleural effusion. No definite left pleural effusion. No pneumothorax. No acute osseous abnormality. Enteric tube terminates in the stomach. IMPRESSION: 1. Unchanged appearance of endoluminal stent and cardiomediastinal contours. 2. Redemonstrated interstitial and ground-glass opacities, possibly mild pulmonary edema. Likely trace right pleural effusion. 3. Enteric tube terminates in the stomach. Electronically Signed   By: Wiliam Ke M.D.   On: 11/05/2022  13:09   DG Abd Portable 1V  Result Date: 11/04/2022 CLINICAL DATA:  Feeding tube placement. EXAM: PORTABLE ABDOMEN - 1 VIEW COMPARISON:  09/02/2022. FINDINGS: Feeding tube is looped in the stomach with the tip near the gastric cardia. IMPRESSION: Feeding tube terminates in the stomach. Electronically Signed   By: Leanna Battles M.D.   On: 11/04/2022 12:55   CT ANGIO HEAD NECK W WO CM  Result Date: 11/01/2022 CLINICAL DATA:  Stroke, follow-up EXAM: CT ANGIOGRAPHY HEAD AND NECK WITH AND WITHOUT CONTRAST TECHNIQUE: Multidetector CT imaging of the head and neck was performed using the standard protocol during bolus administration of intravenous contrast. Multiplanar CT image  reconstructions and MIPs were obtained to evaluate the vascular anatomy. Carotid stenosis measurements (when applicable) are obtained utilizing NASCET criteria, using the distal internal carotid diameter as the denominator. RADIATION DOSE REDUCTION: This exam was performed according to the departmental dose-optimization program which includes automated exposure control, adjustment of the mA and/or kV according to patient size and/or use of iterative reconstruction technique. CONTRAST:  75mL OMNIPAQUE IOHEXOL 350 MG/ML SOLN COMPARISON:  No prior CT head available, correlation is made with MRI head 11/01/2022 and CTA chest 10/30/2022 FINDINGS: CT HEAD FINDINGS Brain: No evidence of acute infarct, hemorrhage, mass, mass effect, or midline shift. No hydrocephalus or extra-axial fluid collection. The punctate infarcts noted on the same-day MRI are not discernible on the CT. Vascular: No hyperdense vessel. Skull: Negative for fracture or focal lesion. Sinuses/Orbits: No acute finding. Other: The mastoid air cells are well aerated. CTA NECK FINDINGS Aortic arch: Two-vessel arch with a common origin of the brachiocephalic and left common carotid arteries. Imaged portion shows no evidence of known dissection, now status post graft placement. A stent extends into the origin of left subclavian artery, which appears narrowed as it passes through the aortic graft (series 11, image 348), measuring approximately 2 mm (series 11, image 348), with the proximal and distal diameter of the stent approximately 8 mm. Right carotid system: No evidence of stenosis, dissection, or occlusion. Left carotid system: No evidence of stenosis, dissection, or occlusion. Vertebral arteries: No evidence of stenosis, dissection, or occlusion. Diminished opacification of the left vertebral artery as it ascends in the cervical spine, which may be related to stenosis in the left subclavian stent. Skeleton: No acute osseous abnormality. Degenerative  changes in the cervical spine. Other neck: No acute finding. Upper chest: Small left pleural effusion. Emphysema. Redemonstrated mediastinal hematoma, without evidence of contrast extravasation. Review of the MIP images confirms the above findings CTA HEAD FINDINGS Anterior circulation: Both internal carotid arteries are patent to the termini, without significant stenosis. A1 segments patent. Normal anterior communicating artery. Anterior cerebral arteries are patent to their distal aspects without significant stenosis. No M1 stenosis or occlusion. MCA branches perfused to their distal aspects without significant stenosis. Posterior circulation: Vertebral arteries patent to the vertebrobasilar junction without significant stenosis, although diminished opacification of the left vertebral artery continues intracranially. Posterior inferior cerebellar artery patent on the right. Common origin of the left PICA and AICA a Basilar patent to its distal aspect without significant stenosis. Superior cerebellar arteries patent proximally. Patent P1 segments. PCAs perfused to their distal aspects without significant stenosis. The bilateral posterior communicating arteries are not visualized. Venous sinuses: As permitted by contrast timing, patent. Anatomic variants: None significant. Review of the MIP images confirms the above findings IMPRESSION: 1. No acute intracranial process. 2. No intracranial large vessel occlusion or significant stenosis. 3. No hemodynamically significant stenosis  in the carotid and vertebral arteries. 4. Status post aortic arch graft for aortic dissection with left subclavian origin stent, which appears quite narrow as it passes through the graft. There is diminishing opacification of the left vertebral artery as it ascends in the cervical spine, as well as intracranially, concerning for hemodynamically significant stenosis from stent narrowing. 5. Redemonstrated mediastinal hematoma, without evidence  of active contrast extravasation. Imaging results were communicated on 11/01/2022 at 4:08 pm to provider Dr. Amada Jupiter via secure text paging. Electronically Signed   By: Wiliam Ke M.D.   On: 11/01/2022 16:08   MR THORACIC SPINE W WO CONTRAST  Result Date: 11/01/2022 CLINICAL DATA:  72 year old male with paresthesia, numbness and tingling. Aortic dissection status post thoracic aortic repair. EXAM: MRI THORACIC WITHOUT AND WITH CONTRAST TECHNIQUE: Multiplanar and multiecho pulse sequences of the thoracic spine were obtained without and with intravenous contrast. CONTRAST:  10mL GADAVIST GADOBUTROL 1 MMOL/ML IV SOLN COMPARISON:  None Available. FINDINGS: The examination had to be discontinued prior to completion due to patient desaturation. Subsequently, only sagittal and axial T1 postcontrast imaging of the thoracic spine is provided. This is compared with complete Cervical spine MRI today reported separately. Upper thoracic spinal canal is capacious, and below the C6-C7 disc space the upper thoracic spinal cord appears normal to the T3-T4 level on the cervical comparison. These images are affected by aortic endograft related susceptibility artifact 2 add greater degree than the cervical images today. Also wrap artifact is present. Subsequently, detail of the thoracic spinal cord is limited, and only T1 weighted postcontrast thoracic cord imaging is provided here. But the thoracic spinal canal appears capacious. And there is no evidence of thoracic spinal cord swelling or abnormal enhancement. IMPRESSION: 1. Discontinued exam, limited to only T1 postcontrast imaging of the thoracic spine which is significantly degraded from aortic endograft susceptibility artifact. 2. Capacious thoracic spinal canal. Upper thoracic spinal cord seen to be normal on Cervical sagittal today (reported separately). No evidence of thoracic spinal cord swelling or abnormal enhancement below the T3 level. Electronically Signed    By: [name] M.D.   On: 11/01/2022 10:35   MR CERVICAL SPINE W WO CONTRAST  Result Date: 11/01/2022 CLINICAL DATA:  72 year old male with paresthesia, numbness and tingling. Aortic dissection status post thoracic aortic repair. EXAM: MRI CERVICAL SPINE WITHOUT AND WITH CONTRAST TECHNIQUE: Multiplanar and multiecho pulse sequences of the cervical spine, to include the craniocervical junction and cervicothoracic junction, were obtained without and with intravenous contrast. CONTRAST:  10mL GADAVIST GADOBUTROL 1 MMOL/ML IV SOLN COMPARISON:  Brain MRI without and with contrast 0831 hours today. Previous cervical spine MRI 07/22/2013. FINDINGS: Alignment: Reversal of the normal cervical lordosis, versus more straightening of lordosis in 2015. No significant spondylolisthesis. Vertebrae: No marrow edema or evidence of acute osseous abnormality. Visualized bone marrow signal is within normal limits. Cord: Normal above C6, and capacious cervical spinal canal. At the C6-C7 spinal cord level there is an oval roughly 8 minimally ule ower roughly 8 mm long segment of central right hemi cord T2 and STIR hyperintensity, with the much smaller left hemicord central area of increased signal. See series 11, image 8 and series 16, image 31. No associated enhancement. Mild if any cord expansion there. Thoracic spinal cord today is reported separately. No abnormal intradural enhancement elsewhere.  No dural thickening. Posterior Fossa, vertebral arteries, paraspinal tissues: Cervicomedullary junction is within normal limits. Brain is detailed separately today. Preserved major vascular flow voids in the neck. Trace  retained secretions in the pharynx. Right IJ approach vascular catheter. Pronounced left thoracic inlet susceptibility artifact, likely endograft related. Disc levels: Generally mild for age cervical spine degeneration has not significantly changed from the 2015 MRI and there is a capacious spinal canal with no cervical  spinal stenosis. IMPRESSION: 1. Right worse than left C6-C7 spinal cord signal abnormality without enhancement is compatible with subacute cord edema or developing myelomalacia. 2. No other acute finding in the cervical spine and capacious spinal canal, mild for age cervical spine degeneration. 3. Thoracic MRI reported separately. Electronically Signed   By: [name] M.D.   On: 11/01/2022 10:30   MR BRAIN WO CONTRAST  Result Date: 11/01/2022 CLINICAL DATA:  Numbness or tingling, paresthesia (Ped 0-17y) s/p repair of thoracic aorta EXAM: MRI HEAD WITHOUT CONTRAST TECHNIQUE: Multiplanar, multiecho pulse sequences of the brain and surrounding structures were obtained without intravenous contrast. COMPARISON:  None Available. FINDINGS: Brain: Punctate acute infarcts in the right cerebellum (series 2, image 16), splenium of the corpus callosum on the left (series 2, image 34), right occipital lobe (series 2, image 32), in the right parietal lobe (series 2, image 47), left lentiform nucleus (series 2, image 27). No hemorrhage. No hydrocephalus. No extra-axial fluid collection. Sequela mild overall chronic microvascular ischemic change. Vascular: Normal flow voids. Skull and upper cervical spine: Normal marrow signal. Sinuses/Orbits: No middle ear or mastoid effusion. Paranasal sinuses are notable for mucosal thickening in the bilateral ethmoid sinuses. Orbits are unremarkable. Other: None. IMPRESSION: Punctate acute infarcts in the right cerebellum, splenium of the corpus callosum on the left, right occipital lobe, right parietal lobe, and left lentiform nucleus. Given distribution and appearance these are favored to be embolic in nature. Electronically Signed   By: Lorenza Cambridge M.D.   On: 11/01/2022 09:12   DG Chest Port 1 View  Result Date: 10/30/2022 CLINICAL DATA:  Status post thoracic aortic aneurysm repair EXAM: PORTABLE CHEST 1 VIEW COMPARISON:  10/30/2022 FINDINGS: Single frontal view of the chest  demonstrates interval placement of endoluminal stent graft within the aortic arch and descending thoracic aorta. Stable mediastinal and periaortic hematoma as seen on previous CT. Cardiac silhouette is stable. Increased interstitial and ground-glass opacities throughout the lungs could reflect an element of edema. No effusion or pneumothorax. Right internal jugular catheter tip overlies the superior vena cava. IMPRESSION: 1. Interval endoluminal stent graft repair within the distal aortic arch and descending thoracic aorta. Stable mediastinal and periaortic hematoma. 2. Mild pulmonary edema. 3. No complication after right internal jugular catheter placement. Electronically Signed   By: Sharlet Salina M.D.   On: 10/30/2022 21:44   PERIPHERAL VASCULAR CATHETERIZATION  Result Date: 10/30/2022 See surgical note for result.  EP STUDY  Result Date: 10/30/2022 See surgical note for result.  HYBRID OR IMAGING ([facility] ONLY)  Result Date: 10/30/2022 There is no interpretation for this exam.  This order is for images obtained during a surgical procedure.  Please See "Surgeries" Tab for more information regarding the procedure.   CT Angio Chest/Abd/Pel for Dissection W and/or W/WO  Result Date: 10/30/2022 CLINICAL DATA:  Chest pain. History of aortic dissection. New sudden chest pain radiating to back which started today. EXAM: CT ANGIOGRAPHY CHEST, ABDOMEN AND PELVIS TECHNIQUE: Non-contrast CT of the chest was initially obtained. Multidetector CT imaging through the chest, abdomen and pelvis was performed using the standard protocol during bolus administration of intravenous contrast. Multiplanar reconstructed images and MIPs were obtained and reviewed to evaluate the vascular anatomy. RADIATION  DOSE REDUCTION: This exam was performed according to the departmental dose-optimization program which includes automated exposure control, adjustment of the mA and/or kV according to patient size and/or use of iterative  reconstruction technique. CONTRAST:  75mL OMNIPAQUE IOHEXOL 350 MG/ML SOLN COMPARISON:  Prior CT scans from 10/25/2022 and 2022/10/30 FINDINGS: CTA CHEST FINDINGS Cardiovascular: Significant progression of aortic dissection with a focal area of active extravasation and significant hematoma in the mediastinum and pericardium new since the prior study. No involvement of the ascending aorta is identified. The aortic branch vessels are patent. Progressive intramural hematoma compressing the descending thoracic aorta. Mediastinum/Nodes: Mediastinal hematoma. No obvious mass or adenopathy. The esophagus is grossly normal. Lungs/Pleura: Small left pleural effusion. Streaky bibasilar atelectasis. No pulmonary edema. Stable pulmonary nodules. Musculoskeletal: No significant bony findings. Review of the MIP images confirms the above findings. CTA ABDOMEN AND PELVIS FINDINGS VASCULAR Aorta: Intramural hematoma extends down into the abdominal aorta. There is a new small intramural bleed involving the abdominal aorta just below the right renal artery takeoff. Celiac: Small focal dissection noted in the celiac artery but no occlusion. SMA: Widely patent. Renals: The main right and left renal arteries are normal. There is a small secondary left renal artery. IMA: Patent Inflow: No dissection or aneurysm. Moderate atherosclerotic calcifications. Veins: Patent Review of the MIP images confirms the above findings. NON-VASCULAR Hepatobiliary: Again demonstrated is a large partially necrotic right hepatic lobe mass. Pancreas: No significant findings. Spleen: Status post splenectomy. Adrenals/Urinary Tract: The adrenal glands and kidneys are unremarkable. The bladder is unremarkable. Stomach/Bowel: The stomach, duodenum small and colon are unremarkable. No acute inflammatory process or obstructive findings. Lymphatic: No lymphadenopathy. Reproductive: The prostate gland is normal. The seminal vesicles are normal. Other: No pelvic mass  or adenopathy. No free pelvic fluid collections. No inguinal mass or adenopathy. No abdominal wall hernia or subcutaneous lesions. Musculoskeletal: No significant bony findings. Review of the MIP images confirms the above findings. IMPRESSION: 1. Significant progression of aortic dissection with a focal area of new active extravasation just past the left subclavian artery with a gastric at dimensions pulmonary-matters vascular pulmonary nodules significant hematoma in the mediastinum and pericardium new since the prior study. 2. New small intramural bleed involving the abdominal aorta just below the right renal artery takeoff. 3. Small focal dissection in the celiac artery but no occlusion. 4. Stable large partially necrotic right hepatic lobe mass. 5. Small left pleural effusion and bibasilar atelectasis. These results were called by telephone at the time of interpretation on 10/30/2022 at 3:01 pm to provider Carmell Austria, MD , who verbally acknowledged these results. Electronically Signed   By: Rudie Meyer M.D.   On: 10/30/2022 15:02   DG Chest 2 View  Result Date: 10/30/2022 CLINICAL DATA:  Chest pain, shortness of breath EXAM: CHEST - 2 VIEW COMPARISON:  Previous studies including the CT done on 10/30/22 and chest radiographs done on 10/25/2022 FINDINGS: Transverse diameter of heart is in the upper limits of normal. Thoracic aorta is ectatic. Increased interstitial markings are seen in both lower lung fields. There is linear density in right parahilar region. There is no pleural effusion or pneumothorax. No new focal pulmonary consolidation is seen. There is no pleural effusion or pneumothorax. IMPRESSION: Increased interstitial markings in both lower lung fields suggest possible scarring. There are no signs of alveolar pulmonary edema or new focal pulmonary consolidation. Electronically Signed   By: Ernie Avena M.D.   On: 10/30/2022 12:30   CT Angio Chest/Abd/Pel for Dissection  W and/or  W/WO  Result Date: 10/28/2022 CLINICAL DATA:  Follow-up dissection EXAM: CT ANGIOGRAPHY CHEST, ABDOMEN AND PELVIS TECHNIQUE: Non-contrast CT of the chest was initially obtained. Multidetector CT imaging through the chest, abdomen and pelvis was performed using the standard protocol during bolus administration of intravenous contrast. Multiplanar reconstructed images and MIPs were obtained and reviewed to evaluate the vascular anatomy. RADIATION DOSE REDUCTION: This exam was performed according to the departmental dose-optimization program which includes automated exposure control, adjustment of the mA and/or kV according to patient size and/or use of iterative reconstruction technique. CONTRAST:  OMNIPAQUE IOHEXOL 350 MG/ML SOLN COMPARISON:  Chest CT dated Sep 10, 2022 FINDINGS: CTA CHEST FINDINGS Cardiovascular: Normal heart size. No pericardial effusion. No coronary artery calcifications. No suspicious filling defects of the central pulmonary arteries. Mediastinum/Nodes: Esophagus and thyroid are unremarkable. No enlarged lymph nodes seen in the chest. Lungs/Pleura: Central airways are patent. Moderate centrilobular emphysema. Bibasilar atelectasis. Stable mild lower lung predominant fibrosis. Redemonstrated numerous bilateral solid pulmonary nodules. No pleural effusion or pneumothorax. Musculoskeletal: No chest wall abnormality. No acute or significant osseous findings. Review of the MIP images confirms the above findings. CTA ABDOMEN AND PELVIS FINDINGS VASCULAR Aorta: Stable type B thrombosed dissection versus intramural hematoma extending from the origin of the left subclavian artery to just above the celiac artery. Mild atherosclerotic disease. Celiac: Patent without evidence of aneurysm, dissection, vasculitis or significant stenosis. SMA: Patent without evidence of aneurysm, dissection, vasculitis or significant stenosis. Renals: Both renal arteries are patent without evidence of aneurysm,  dissection, vasculitis, fibromuscular dysplasia or significant stenosis. IMA: Patent without evidence of aneurysm, dissection, vasculitis or significant stenosis. Inflow: Patent without evidence of aneurysm, dissection, vasculitis or significant stenosis. Veins: No obvious venous abnormality within the limitations of this arterial phase study. Review of the MIP images confirms the above findings. NON-VASCULAR Hepatobiliary: Large mass of the right lobe of the liver, unchanged. Normal appearance of the gallbladder. No biliary ductal dilation. Pancreas: Unremarkable. No pancreatic ductal dilatation or surrounding inflammatory changes. Spleen: Left upper quadrant splenules. Adrenals/Urinary Tract: Bilateral adrenal glands are unremarkable. No hydronephrosis nephrolithiasis. 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 Bladder is unremarkable. Stomach/Bowel: Stomach is within normal limits. Appendix appears normal. No evidence of bowel wall thickening, distention, or inflammatory changes. Lymphatic: No enlarged lymph nodes seen in the abdomen or pelvis Reproductive: Unremarkable. Other: No abdominal wall hernia or abnormality. No abdominopelvic ascites. Musculoskeletal: No acute or significant osseous findings. Review of the MIP images confirms the above findings. IMPRESSION: 1. Stable type B thrombosed dissection/intramural hematoma extending from the origin of the left subclavian artery to just above the celiac artery. 2. Redemonstrated numerous bilateral solid pulmonary nodules, concerning for metastatic disease. 3. Large mass of the right lobe of the liver, consistent with history of hepatocellular carcinoma. 4. 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 5. Aortic Atherosclerosis (ICD10-I70.0) and Emphysema  (ICD10-J43.9). Electronically Signed   By: Allegra Lai M.D.   On: 10/28/2022 11:15   CT Angio Chest/Abd/Pel for Dissection W and/or Wo Contrast  Result Date: 10/25/2022 CLINICAL DATA:  Worsening severe substernal chest pain radiating from right shoulder to back, history of hepatocellular carcinoma EXAM: CT ANGIOGRAPHY CHEST, ABDOMEN AND PELVIS TECHNIQUE: Non-contrast CT of the chest was initially obtained. Multidetector CT imaging through the chest, abdomen and pelvis  was performed using the standard protocol during bolus administration of intravenous contrast. Multiplanar reconstructed images and MIPs were obtained and reviewed to evaluate the vascular anatomy. RADIATION DOSE REDUCTION: This exam was performed according to the departmental dose-optimization program which includes automated exposure control, adjustment of the mA and/or kV according to patient size and/or use of iterative reconstruction technique. CONTRAST:  75mL OMNIPAQUE IOHEXOL 350 MG/ML SOLN COMPARISON:  09/10/2022, 09/20/2022, 08/19/2022 FINDINGS: CTA CHEST FINDINGS Cardiovascular: There is either intramural hematoma or thrombosed dissection of the descending thoracic aorta, extending from the distal arch just beyond the left subclavian artery origin to the level of the diaphragmatic hiatus. Maximal thickness of the intramural hematoma or thrombosed dissection measures up to 1.3 cm at the level of the proximal descending thoracic aorta reference image 43/6. This has developed since the prior CT scans performed 08/19/2022 and 09/10/2022. There is no periaortic fat stranding to suggest leak or rupture. No significant stenosis within the true lumen of the aorta. Minimal calcified atheromatous plaque within the distal aortic arch and descending thoracic aorta. No evidence of thoracic aortic aneurysm. The heart is unremarkable without pericardial effusion. Timing of the contrast bolus is suboptimal for evaluation of the pulmonary  vasculature. Mediastinum/Nodes: No enlarged mediastinal, hilar, or axillary lymph nodes. Thyroid gland, trachea, and esophagus demonstrate no significant findings. Lungs/Pleura: No acute airspace disease, effusion, or pneumothorax. Minimal bibasilar hypoventilatory changes. Mild emphysema. There are multiple small pulmonary nodules that have developed in the interim, consistent with metastatic disease. Index nodules are as follows: Left lower lobe, image 74/11, 7 mm. Left upper lobe, image 30/11, 4 mm. Right upper lobe, image 61/11, 6 mm. Central airways are patent. Musculoskeletal: No acute or destructive bony abnormalities. Reconstructed images demonstrate no additional findings. Review of the MIP images confirms the above findings. CTA ABDOMEN AND PELVIS FINDINGS VASCULAR Aorta: Normal caliber aorta without aneurysm, dissection, vasculitis or significant stenosis. Celiac: Patent without evidence of aneurysm, dissection, vasculitis or significant stenosis. SMA: Patent without evidence of aneurysm, dissection, vasculitis or significant stenosis. Renals: Both renal arteries are patent without evidence of aneurysm, dissection, vasculitis, fibromuscular dysplasia or significant stenosis. IMA: Patent without evidence of aneurysm, dissection, vasculitis or significant stenosis. Inflow: Patent without evidence of aneurysm, dissection, vasculitis or significant stenosis. Veins: No obvious venous abnormality within the limitations of this arterial phase study. Review of the MIP images confirms the above findings. NON-VASCULAR Hepatobiliary: Large necrotic mass within the right lobe liver measuring up to 14.8 x 13.5 cm compatible with the patient's known hepatocellular carcinoma. Exact comparison to prior imaging is difficult due to differences in phase of contrast enhancement, though subjectively the mass appears larger than the previous abdominal CT. Gallbladder is unremarkable. No biliary duct dilation. Pancreas:  Unremarkable. No pancreatic ductal dilatation or surrounding inflammatory changes. Spleen: Small residual splenules in the left upper quadrant are unchanged. Adrenals/Urinary Tract: The adrenals are stable. 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 No urinary tract calculi or obstructive uropathy within either kidney. Bladder is unremarkable. Stomach/Bowel: No bowel obstruction or ileus. No bowel wall thickening or inflammatory change. Lymphatic: No pathologic adenopathy within the abdomen or pelvis. Reproductive: Prostate is unremarkable. Other: No free fluid or free intraperitoneal gas. Small fat containing umbilical hernia. No bowel herniation. Musculoskeletal: No acute or destructive bony abnormalities. Reconstructed images demonstrate no additional findings. Review  of the MIP images confirms the above findings. IMPRESSION: Vascular: 1. 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 Maximal thickness of the thrombosed false lumen of the dissection measures 1.3 cm. No evidence of true luminal narrowing or aneurysm. 2. Unremarkable abdominal aorta. 3.  Aortic Atherosclerosis (ICD10-I70.0). Nonvascular: 1. Enlarging necrotic right lobe liver mass consistent with progressive hepatocellular carcinoma. 2. Interval development of numerous bilateral pulmonary nodules consistent with pulmonary metastatic disease. 3. 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 4.  Emphysema (ICD10-J43.9). Critical Value/emergent results were called by telephone at the time of interpretation on 10/25/2022 at 4:47 pm to provider [facility] RAY , who verbally acknowledged these results. Electronically Signed   By: Sharlet Salina M.D.   On:  10/25/2022 16:58   DG Chest Portable 1 View  Result Date: 10/25/2022 CLINICAL DATA:  Chest pain EXAM: PORTABLE CHEST 1 VIEW COMPARISON:  Chest radiograph 09/02/2022 FINDINGS: The cardiomediastinal silhouette is normal. A loop recorder is again noted. There is asymmetric elevation of the right hemidiaphragm. There is no acute osseous abnormality. Coarsened interstitial markings primarily in the lower lobes correspond to changes of chronic interstitial lung disease as seen on recent CT chest. There is no superimposed acute consolidation. There is no pulmonary edema. There is no pleural effusion or pneumothorax. IMPRESSION: No radiographic evidence of acute cardiopulmonary process. Electronically Signed   By: Lesia Hausen M.D.   On: 10/25/2022 11:48   ECHOCARDIOGRAM COMPLETE  Result Date: 10/25/2022    ECHOCARDIOGRAM REPORT   Patient Name:   JOSEANTONIO DITTMAR Date of Exam: 10/25/2022 Medical Rec #:  [id number]    Height:       75.0 in Accession #:    [id number]   Weight:       185.0 lb Date of Birth:  1950/12/01   BSA:          2.123 m Patient Age:    71 years     BP:           148/99 mmHg Patient Gender: M            HR:           54 bpm. Exam Location:  Inpatient Procedure: 2D Echo, Color Doppler and Cardiac Doppler STAT ECHO Indications:    R07.9* Chest pain, unspecified  History:        Patient has prior history of Echocardiogram examinations, most                 recent 03/26/2018. Risk Factors:Hypertension and Sleep Apnea.  Sonographer:    Irving Burton Senior RDCS Referring Phys: [phone] MICHAEL COOPER IMPRESSIONS  1. Left ventricular ejection fraction, by estimation, is 55%. The left ventricle has normal function. The left ventricle has no regional wall motion abnormalities. There is moderate asymmetric left ventricular hypertrophy of the septal segment. Left ventricular diastolic parameters are consistent with Grade I diastolic dysfunction (impaired relaxation).  2. Right ventricular systolic function is normal. The  right ventricular size is normal. There is normal pulmonary artery systolic pressure.  3. The mitral valve is normal in structure. Trivial mitral valve regurgitation. No evidence of mitral stenosis.  4. The aortic valve is tricuspid. Aortic valve regurgitation is not visualized. No aortic stenosis is present.  5. The inferior vena cava is dilated in size with <50% respiratory variability, suggesting right atrial pressure of 15 mmHg. FINDINGS  Left Ventricle: Left ventricular ejection fraction, by estimation, is 55%. The left ventricle has normal function. The left ventricle has no regional wall motion abnormalities. The left ventricular internal cavity size was normal in size. There is moderate asymmetric left ventricular hypertrophy of the septal segment. Left ventricular diastolic parameters are consistent with Grade I diastolic dysfunction (impaired relaxation). Right Ventricle: The right ventricular size is normal. No increase in right ventricular wall thickness. Right ventricular systolic function is normal. There is normal pulmonary artery systolic pressure. The tricuspid regurgitant velocity is 1.63 m/s, and  with an assumed right atrial pressure of 15 mmHg, the estimated right ventricular systolic pressure is 25.6 mmHg. Left Atrium: Left atrial size was normal in size. Right Atrium: Right atrial size was normal in size. Pericardium: Trivial pericardial effusion is present. Mitral Valve: The mitral valve is normal in structure. Trivial mitral valve regurgitation. No evidence of mitral valve stenosis. Tricuspid Valve: The tricuspid valve is normal in structure. Tricuspid valve regurgitation is mild . No evidence of tricuspid stenosis. Aortic Valve: The aortic valve is tricuspid. Aortic valve regurgitation is not visualized. No aortic stenosis is present. Pulmonic Valve: The pulmonic valve was normal in structure. Pulmonic valve regurgitation is mild. No evidence of pulmonic stenosis. Aorta: The aortic root and  ascending aorta are structurally normal, with no evidence of dilitation. Ascending aorta measurements are within normal limits for age when indexed to body surface area. Venous: The inferior vena cava is dilated in size with less than 50% respiratory variability, suggesting right atrial pressure of 15 mmHg. IAS/Shunts: No atrial level shunt detected by color flow Doppler.  LEFT VENTRICLE PLAX 2D LVIDd:         3.80 cm   Diastology LVIDs:         2.70 cm   LV e' medial:    6.74 cm/s LV PW:         1.10 cm   LV E/e' medial:  8.1 LV IVS:        1.40 cm   LV e' lateral:   6.53 cm/s LVOT diam:     2.30 cm   LV E/e' lateral: 8.4 LV SV:         90 LV SV Index:   42 LVOT Area:     4.15 cm  RIGHT VENTRICLE RV S prime:     11.50 cm/s TAPSE (M-mode): 1.7 cm LEFT ATRIUM             Index        RIGHT ATRIUM           Index LA diam:        2.80 cm 1.32 cm/m   RA Area:     15.30 cm LA Vol (A2C):   48.8 ml 22.99 ml/m  RA Volume:   39.10 ml  18.42 ml/m LA Vol (A4C):   35.9 ml 16.91 ml/m LA Biplane Vol: 42.8 ml 20.16 ml/m  AORTIC VALVE LVOT Vmax:   97.40 cm/s LVOT Vmean:  75.300 cm/s LVOT VTI:    0.217 m  AORTA Ao Root diam: 3.70 cm Ao Asc diam:  3.80 cm MITRAL VALVE               TRICUSPID VALVE MV Area (PHT): 4.12 cm    TR Peak grad:   10.6 mmHg MV Decel Time: 184 msec    TR Vmax:        163.00 cm/s MV E velocity: 54.80 cm/s MV A velocity: 62.20 cm/s  SHUNTS MV E/A ratio:  0.88        Systemic VTI:  0.22 m                            Systemic Diam: 2.30 cm Weston Brass MD Electronically signed by Weston Brass MD Signature Date/Time: 10/25/2022/10:57:13 AM    Final       Discharge Exam: Vitals:   11/18/22 2319 11/19/22 0855  BP: (!) 143/84 (!) 155/91  Pulse: (!) 107 (!) 102  Resp: 20 18  Temp: 99.6 F (37.6 C) 98.7 F (37.1 C)  SpO2: 93% 96%    General: Pt is alert, awake, not in acute distress Cardiovascular: Tachycardic, regular rhythm, S1/S2 +, no edema Respiratory: CTA bilaterally, no wheezing, no  rhonchi, no respiratory distress, no conversational dyspnea  Abdominal: Soft, NT, ND, bowel sounds + Extremities: no edema, no cyanosis Psych: Normal mood and affect, stable judgement and insight     The results of significant diagnostics from this hospitalization (including imaging, microbiology, ancillary and laboratory) are listed below for reference.     Microbiology: Recent Results (from the past 240 hour(s))  SARS Coronavirus 2 by RT PCR (hospital order, performed in [facility] hospital lab) *cepheid single result test* Anterior Nasal Swab     Status: None   Collection Time: 11/14/22  9:30 AM   Specimen: Anterior Nasal Swab  Result Value Ref Range Status   SARS Coronavirus 2 by RT PCR NEGATIVE NEGATIVE Final    Comment: Performed at [facility], [address] [place], [place] [address]  Culture, blood (Routine X 2) w Reflex to ID Panel     Status: None   Collection Time: 11/14/22 10:10 AM   Specimen: BLOOD LEFT HAND  Result Value Ref Range Status   Specimen Description BLOOD LEFT HAND  Final   Special Requests   Final    BOTTLES DRAWN AEROBIC ONLY Blood Culture adequate volume   Culture   Final    NO GROWTH 5 DAYS Performed at [facility], [address] [place], [place] [address]    Report Status 11/19/2022 FINAL  Final  Culture, blood (Routine X 2) w Reflex to ID Panel     Status: None   Collection Time: 11/14/22 10:33 AM   Specimen: BLOOD RIGHT HAND  Result Value Ref Range Status   Specimen Description BLOOD RIGHT HAND  Final   Special Requests   Final    BOTTLES DRAWN AEROBIC ONLY Blood Culture adequate volume   Culture   Final    NO GROWTH 5 DAYS Performed at [facility], [address], [place], [place] [address]    Report Status 11/19/2022 FINAL  Final     Labs: BNP (last 3 results) No results for input(s): "BNP" in the last 8760 hours. Basic Metabolic Panel: Recent Labs  Lab 11/15/22 0754 11/16/22 0123 11/17/22 0120  11/18/22 0141 11/19/22 0326  NA 134* 126* 128* 129* 128*  K 3.4* 3.5 5.0 4.5 4.3  CL 107 100 99 101 102  CO2 19* 20* 18* 19* 19*  GLUCOSE 68* 141* 96 98 99  BUN 30* 25* 23 22 19   CREATININE 1.38* 1.19 1.18 1.31* 1.17  CALCIUM 8.1* 7.6* 7.9* 8.0* 8.0*  MG 2.2  --  1.8  --   --    Liver Function Tests: Recent Labs  Lab 11/15/22 0754 11/16/22 0123 11/17/22 0120  AST 139* 121* 129*  ALT 19 16 19   ALKPHOS 136* 141* 136*  BILITOT 1.1 1.0 0.9  PROT 7.5 7.2 7.5  ALBUMIN 1.6* <1.5* 1.6*   No results for input(s): "LIPASE", "AMYLASE" in the last 168 hours. No results for input(s): "AMMONIA" in the last 168 hours. CBC: Recent Labs  Lab 11/14/22 1033 11/15/22 0754 11/16/22 0123 11/17/22 0120 11/18/22 0141  WBC 15.5* 14.4* 15.8* 14.0* 13.3*  HGB 13.8 12.5* 12.1* 12.8* 12.7*  HCT 43.6 38.9* 37.2* 39.2 39.0  MCV 81.3 81.2 79.8* 78.4* 78.8*  PLT 485* 373 427* 433* 430*   Cardiac Enzymes: No results for input(s): "CKTOTAL", "CKMB", "CKMBINDEX", "TROPONINI" in the last 168 hours. BNP: Invalid input(s): "POCBNP" CBG: Recent Labs  Lab 11/18/22 1612 11/18/22 2001 11/18/22 2332 11/19/22 0349 11/19/22 0900  GLUCAP 96 99 96 99 127*   D-Dimer No results for input(s): "DDIMER" in the last 72 hours. Hgb A1c No results for input(s): "HGBA1C" in the last 72 hours. Lipid Profile No results for input(s): "CHOL", "HDL", "LDLCALC", "TRIG", "CHOLHDL", "LDLDIRECT" in the last 72 hours. Thyroid function studies No results for input(s): "TSH", "T4TOTAL", "T3FREE", "THYROIDAB" in the last 72 hours.  Invalid input(s): "FREET3" Anemia work up No results for input(s): "VITAMINB12", "FOLATE", "FERRITIN", "TIBC", "IRON", "RETICCTPCT" in the last 72 hours. Urinalysis    Component Value Date/Time   COLORURINE AMBER (A) 11/14/2022 1604   APPEARANCEUR CLEAR 11/14/2022 1604   LABSPEC 1.017 11/14/2022 1604   PHURINE 5.0 11/14/2022 1604   GLUCOSEU NEGATIVE 11/14/2022 1604   HGBUR NEGATIVE  11/14/2022 1604   BILIRUBINUR NEGATIVE 11/14/2022 1604   KETONESUR NEGATIVE 11/14/2022 1604   PROTEINUR NEGATIVE 11/14/2022 1604   UROBILINOGEN 1.0 06/06/2013 2316   NITRITE NEGATIVE 11/14/2022 1604   LEUKOCYTESUR NEGATIVE 11/14/2022 1604   Sepsis Labs Recent Labs  Lab 11/15/22 0754 11/16/22 0123 11/17/22 0120 11/18/22 0141  WBC 14.4* 15.8* 14.0* 13.3*   Microbiology Recent Results (from the past 240 hour(s))  SARS Coronavirus 2 by RT PCR (hospital order, performed in [facility] hospital lab) *cepheid single result test* Anterior Nasal Swab     Status: None   Collection Time: 11/14/22  9:30 AM   Specimen: Anterior Nasal Swab  Result Value Ref Range Status   SARS Coronavirus 2 by RT PCR NEGATIVE NEGATIVE Final    Comment: Performed at [facility], [address], [place], [place] [address]  Culture, blood (Routine X 2) w Reflex to ID Panel     Status: None   Collection Time: 11/14/22 10:10 AM   Specimen: BLOOD LEFT HAND  Result Value Ref Range Status   Specimen Description BLOOD LEFT HAND  Final   Special Requests   Final    BOTTLES DRAWN AEROBIC ONLY Blood Culture adequate volume   Culture   Final    NO GROWTH 5 DAYS Performed at [facility], [address] [place], [place] [address]    Report Status 11/19/2022 FINAL  Final  Culture, blood (Routine X 2) w Reflex to ID Panel     Status: None   Collection Time: 11/14/22 10:33 AM   Specimen: BLOOD RIGHT HAND  Result Value Ref Range Status   Specimen Description BLOOD RIGHT HAND  Final   Special Requests   Final    BOTTLES DRAWN AEROBIC ONLY Blood Culture adequate volume   Culture   Final    NO GROWTH 5 DAYS Performed at [facility], [address], [place], [place] [address]    Report  Status 11/19/2022 FINAL  Final     Patient was seen and examined on the day of discharge and was found to be in stable condition. Time coordinating discharge: 40 minutes including assessment and coordination of  care, as well as examination of the patient.   SIGNED:  Noralee Stain, DO [facility] 11/19/2022, 12:01 PM

## 2022-11-19 NOTE — Progress Notes (Signed)
Pt with transfer order to [facility] today. Patient and family made aware.  Report called to receiving RN. Awaiting bed/room readiness on 4W.

## 2022-11-19 NOTE — Discharge Instructions (Signed)
STROKE/TIA DISCHARGE INSTRUCTIONS SMOKING Cigarette smoking nearly doubles your risk of having a stroke & is the single most alterable risk factor  If you smoke or have smoked in the last 12 months, you are advised to quit smoking for your health. Most of the excess cardiovascular risk related to smoking disappears within a year of stopping. Ask you doctor about anti-smoking medications [place] Quit Line: 1-800-QUIT NOW Free Smoking Cessation Classes [phone]  CHOLESTEROL Know your levels; limit fat & cholesterol in your diet  Lipid Panel     Component Value Date/Time   CHOL 181 11/02/2022 0410   TRIG 133 11/02/2022 0410   HDL 17 (L) 11/02/2022 0410   CHOLHDL 10.6 11/02/2022 0410   VLDL 27 11/02/2022 0410   LDLCALC 137 (H) 11/02/2022 0410     Many patients benefit from treatment even if their cholesterol is at goal. Goal: Total Cholesterol (CHOL) less than 160 Goal:  Triglycerides (TRIG) less than 150 Goal:  HDL greater than 40 Goal:  LDL (LDLCALC) less than 100   BLOOD PRESSURE American Stroke Association blood pressure target is less that 120/80 mm/Hg  Your discharge blood pressure is:  BP: (!) 147/85 Monitor your blood pressure Limit your salt and alcohol intake Many individuals will require more than one medication for high blood pressure  DIABETES (A1c is a blood sugar average for last 3 months) Goal HGBA1c is under 7% (HBGA1c is blood sugar average for last 3 months)  Diabetes: No known diagnosis of diabetes    Lab Results  Component Value Date   HGBA1C 5.7 (H) 10/25/2022    Your HGBA1c can be lowered with medications, healthy diet, and exercise. Check your blood sugar as directed by your physician Call your physician if you experience unexplained or low blood sugars.  PHYSICAL ACTIVITY/REHABILITATION Goal is 30 minutes at least 4 days per week  Activity: Increase activity slowly, Therapies: Physical Therapy: Home Health Return to work:  Activity decreases your risk of  heart attack and stroke and makes your heart stronger.  It helps control your weight and blood pressure; helps you relax and can improve your mood. Participate in a regular exercise program. Talk with your doctor about the best form of exercise for you (dancing, walking, swimming, cycling).  DIET/WEIGHT Goal is to maintain a healthy weight  Your discharge diet is:  Diet Order             DIET DYS 3 Room service appropriate? No; Fluid consistency: Thin  Diet effective now                   liquids Your height is:  Height: 6\' 3"  (190.5 cm) Your current weight is: Weight: 90.7 kg Your Body Mass Index (BMI) is:  BMI (Calculated): 24.99 Following the type of diet specifically designed for you will help prevent another stroke. Your goal weight range is:   Your goal Body Mass Index (BMI) is 19-24. Healthy food habits can help reduce 3 risk factors for stroke:  High cholesterol, hypertension, and excess weight.  RESOURCES Stroke/Support Group:  Call [phone]   STROKE EDUCATION PROVIDED/REVIEWED AND GIVEN TO PATIENT Stroke warning signs and symptoms How to activate emergency medical system (call 911). Medications prescribed at discharge. Need for follow-up after discharge. Personal risk factors for stroke. Pneumonia vaccine given: No Flu vaccine given: No My questions have been answered, the writing is legible, and I understand these instructions.  I will adhere to these goals & educational materials that have  been provided to me after my discharge from the hospital.   Inpatient Rehab Discharge Instructions  [facility] Discharge date and time: No discharge date for patient encounter.   Activities/Precautions/ Functional Status: Activity: As tolerated Diet: Regular Wound Care: Routine skin checks Functional status:  ___ No restrictions     ___ Walk up steps independently ___ 24/7 supervision/assistance   ___ Walk up steps with assistance ___ Intermittent  supervision/assistance  ___ Bathe/dress independently ___ Walk with walker     _x__ Bathe/dress with assistance ___ Walk Independently    ___ Shower independently ___ Walk with assistance    ___ Shower with assistance ___ No alcohol     ___ Return to work/school ________  Special Instructions: No driving smoking or alcohol   My questions have been answered and I understand these instructions. I will adhere to these goals and the provided educational materials after my discharge from the hospital.  Patient/Caregiver Signature _______________________________ Date __________  Clinician Signature _______________________________________ Date __________  Please bring this form and your medication list with you to all your follow-up doctor's appointments.

## 2022-11-19 NOTE — H&P (Signed)
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  Per chart review patient lives with spouse.  Two-level home bed bath main level 2 steps to entry.  Independent and working prior to admission.  Presented 10/25/2022 with chest pain radiating to the abdomen.  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  He was started on nitroglycerin infusion.  Given need for tight hemodynamic control PCCM consulted for further management and admission.  VVS and cardiology consulted.  Initially required Esmolol drip, weaned off with the addition of multiple p.o. medications.  Blood pressures normalized and patient was deemed stable for discharge 10/29/2022 due to no expansion of dissection ultimately returning on  6/26 for worsening pain or imaging confirmed aortic rupture requiring endovascular repair by vascular surgery Dr. Chestine Spore.  Hospital course 11/01/2022 patient with left lower extremity weakness of acute onset.  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  CT angiogram head and neck showed no acute intracranial process no intracranial large vessel occlusion or significant stenosis.  No hemodynamically significant stenosis in the carotid and vertebral arteries.  Neurology follow-up and patient was cleared to begin low-dose aspirin.  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 Mosetta Putt and patient not felt to be a candidate for further cancer treatments at this time and patient is currently DNR.  Mild AKI improved with gentle IV fluids latest creatinine 1.17.  Patient was cleared to begin subcutaneous heparin for DVT prophylaxis.  Palliative care was consulted to establish goals of care.  His diet has been advanced to regular consistency.  Therapy evaluations completed due to patient's deconditioning as well as left-sided weakness was admitted for a comprehensive rehab program.   Pt reports LBM yesterday- denies constipation Always has urinary leakage- ever since prostate surgery 20 years ago -prostatectomy.  Having pain in back of neck when stands up as well as weakness in Arms when stands up Always weak since aneurysm surgery in LE's- LLE worse than RLE.  Skin hurts "all over" Using purewick to void-  Meds for coughing were justs tarted- very helpful.  Nausea better with scheduled nausea meds, but not resolved.   Of note, Albumin <1.5   Review of Systems  Constitutional:  Positive for fever.  HENT:  Negative for hearing loss.   Eyes:  Negative for blurred vision and double vision.  Respiratory:  Negative for cough, shortness of breath and wheezing.   Cardiovascular:  Positive for leg swelling. Negative for chest pain and palpitations.  Gastrointestinal:  Positive for abdominal pain. Negative for constipation, diarrhea, nausea and vomiting.  Genitourinary:  Positive for frequency and urgency. Negative for dysuria, flank pain and hematuria.  Musculoskeletal:  Positive for joint pain and myalgias.  Skin:  Negative for rash.  Neurological:  Positive for focal weakness and weakness. Negative for tingling, tremors and sensory change.  Psychiatric/Behavioral:  Positive for depression. Negative  for substance abuse and suicidal ideas.   All other systems reviewed and are negative.  Past Medical History:  Diagnosis Date   Hepatitis C    s/p treatment    Hypertension    Obstructive sleep apnea    unable to tolerate CPAP   Premature ventricular contraction    Prostate cancer (HCC) 2006   Past Surgical History:  Procedure Laterality Date   LOOP RECORDER INSERTION N/A 07/04/2016   Procedure: Loop Recorder Insertion;  Surgeon: Hillis Range, MD;  Location: [facility] INVASIVE [facility];  Service: Cardiovascular;  Laterality: N/A;   prostectomy     SPLENECTOMY, TOTAL     TEE WITHOUT CARDIOVERSION  10/30/2022   Procedure: TRANSESOPHAGEAL ECHOCARDIOGRAM;  Surgeon: Nada Libman, MD;  Location: [facility] OR;  Service: Vascular;;   THORACIC AORTIC ENDOVASCULAR STENT GRAFT N/A 10/30/2022   Procedure: THORACIC AORTIC ENDOVASCULAR STENT GRAFT with Left Subclavian artery revascularization with laser.;  Surgeon: Nada Libman, MD;  Location: [facility];  Service: Vascular;  Laterality: N/A;   Family History  Problem Relation Age of Onset   Cancer Brother        colon cancer and prostate cancer   Heart attack Neg Hx    Social History:  reports that he quit smoking about 3 months ago. His smoking use included cigarettes. He started smoking about 40 years ago. He has a 40 pack-year smoking history. He has been exposed to tobacco smoke. He has never used smokeless tobacco. He reports current alcohol use of about 2.0 standard drinks of alcohol per week. He reports that he does not use drugs. Allergies: No Known Allergies Medications Prior to Admission  Medication Sig Dispense Refill   atorvastatin (LIPITOR) 80 MG tablet Take 1 tablet (80 mg total) by mouth at bedtime. 30 tablet 2   carvedilol (COREG) 25 MG tablet Take 1 tablet (25 mg total) by mouth 2 (two) times daily with a meal. 60 tablet 2   escitalopram (LEXAPRO) 10 MG tablet Take 10 mg by mouth daily.     isosorbide mononitrate (IMDUR) 60 MG 24 hr tablet Take 1 tablet (60 mg total) by mouth daily. 30 tablet 2   losartan (COZAAR) 100 MG tablet Take 1 tablet (100 mg total) by mouth daily. 30 tablet 2   scopolamine  (TRANSDERM-SCOP) 1 MG/3DAYS Place 1 patch (1.5 mg total) onto the skin every 3 (three) days. 10 patch 12   spironolactone (ALDACTONE) 50 MG tablet Take 1 tablet (50 mg total) by mouth daily. 30 tablet 2   valACYclovir (VALTREX) 500 MG tablet Take 500 mg by mouth daily as needed (cold sores flare ups).     amLODipine (NORVASC) 10 MG tablet Take 1 tablet (10 mg total) by mouth daily. 30 tablet 2   amLODipine (NORVASC) 5 MG tablet Take 5 mg by mouth daily.     Apixaban Starter Pack, 10mg  and 5mg , (ELIQUIS DVT/PE STARTER PACK) Take by mouth. Take as directed on package: start with two-5mg  tablets twice daily for 7 days. On day 8, switch to one-5mg  tablet twice daily. (Patient not taking: Reported on 11/09/2022)     dronabinol (MARINOL) 5 MG capsule Take 5 mg by mouth 2 (two) times daily before a meal.        Home: Home Living Family/patient expects to be discharged to:: Skilled nursing facility Living Arrangements: Spouse/significant other Available Help at Discharge: Family, Available PRN/intermittently (can work toward 24/7 assist) Type of Home: House Home Access: Ramped entrance, Stairs to enter [employer]  of Steps: 2 Entrance Stairs-Rails: None Home Layout: Two level, Able to live on main level with bedroom/bathroom, Bed/bath upstairs (only half bath is downstairs) Alternate Level Stairs-Number of Steps: flight Alternate Level Stairs-Rails: Can reach both Bathroom Shower/Tub: Health visitor: Standard Home Equipment: None  Lives With: Spouse   Functional History: Prior Function Prior Level of Function : Independent/Modified Independent, Working/employed, Driving (drove truck) Mobility Comments: no AD use ADLs Comments: ind  Functional Status:  Mobility: Bed Mobility Overal bed mobility: Needs Assistance Bed Mobility: Supine to Sit, Sit to Supine Rolling: Min guard Sidelying to sit: Min assist Supine to sit: Min guard, HOB elevated Sit to supine: Min  guard Sit to sidelying: Max assist, +2 for physical assistance General bed mobility comments: increased time and rest breaks, but no physical assist needed Transfers Overall transfer level: Needs assistance Equipment used: Rolling walker (2 wheels) Transfers: Sit to/from Stand, Bed to chair/wheelchair/BSC Sit to Stand: Min assist, Min guard, From elevated surface Bed to/from chair/wheelchair/BSC transfer type:: Step pivot Squat pivot transfers: Mod assist Step pivot transfers: Min guard Transfer via Lift Equipment: Stedy General transfer comment: From elevated EOB x2 and BSC x1 with min A for power up progressing to min guard with elevated surface. Cues for safety due to pt releasing RW and sitting sideways to EOB before fully turning around despite cues Ambulation/Gait Ambulation/Gait assistance: Min assist Gait Distance (Feet): 50 Feet Assistive device: Rolling walker (2 wheels) Gait Pattern/deviations: Step-through pattern, Decreased stride length, Trunk flexed General Gait Details: Pt demonstrating slow step-through pattern with short steps. Cues and min A for upright posture and RW proximity due to increased trunk flexion with progressed distance. Pre-gait activities: pt steps from bedside commode to chair, tends to sink into knee and hip flexion with stepping    ADL: ADL Overall ADL's : Needs assistance/impaired Eating/Feeding: Moderate assistance, Sitting, Bed level Grooming: Set up, Sitting, Wash/dry face Upper Body Bathing: Moderate assistance, Sitting, Bed level Lower Body Bathing: Maximal assistance, Sit to/from stand Lower Body Bathing Details (indicate cue type and reason): assistance with peri area back while standing Upper Body Dressing : Moderate assistance, Sitting, Bed level Lower Body Dressing: Maximal assistance, Sitting/lateral leans, Bed level Toilet Transfer: Maximal assistance Toilet Transfer Details (indicate cue type and reason): with use of RW, mod A for  [employer]- Clothing Manipulation and Hygiene: Total assistance Functional mobility during ADLs: Moderate assistance  Cognition: Cognition Overall Cognitive Status: Difficult to assess Arousal/Alertness: Awake/alert Orientation Level: Oriented X4 Year: 2024 Month: June Day of Week: Correct Attention: Sustained Sustained Attention: Impaired Sustained Attention Impairment: Verbal complex Memory: Impaired Memory Impairment: Storage deficit Problem Solving: Impaired Problem Solving Impairment: Verbal complex, Functional basic Executive Function: Landscape architect: Impaired Organizing Impairment: Verbal basic, Verbal complex Cognition Arousal/Alertness: Awake/alert Behavior During Therapy: Flat affect Overall Cognitive Status: Difficult to assess Area of Impairment: Following commands Orientation Level: Situation Following Commands: Follows one step commands with increased time General Comments: Pt required repetition of most questions and cues and demonstrates some impairment in short-term memory as he did not recall education on neck ROM and exercises from previous OT session.  Physical Exam: Blood pressure (!) 155/91, pulse (!) 102, temperature 98.7 F (37.1 C), temperature source Oral, resp. rate 20, height 6\' 3"  (1.905 m), weight 87.8 kg, SpO2 96%. Physical Exam Vitals and nursing note reviewed. Exam conducted with a chaperone present.  Constitutional:      Appearance: Normal appearance. He is normal weight.     Comments: Pt sitting  up in bed; godson at bedside- wife came in at end; rehab nursing getting ready to move to CIR; awake, alert, quiet, NAD  HENT:     Head: Normocephalic and atraumatic.     Comments: No facial droop seen- didn't want to smile Tongue midline    Right Ear: External ear normal.     Left Ear: External ear normal.     Nose: Nose normal. No congestion.     Mouth/Throat:     Mouth: Mucous membranes are dry.     Pharynx: Oropharynx is clear.  No oropharyngeal exudate.  Eyes:     General:        Right eye: No discharge.        Left eye: No discharge.     Extraocular Movements: Extraocular movements intact.  Neck:     Comments: Ttp with some tight musculature in neck- esp upper traps and scalenes Cardiovascular:     Rate and Rhythm: Normal rate and regular rhythm.     Heart sounds: Normal heart sounds. No murmur heard.    No gallop.  Pulmonary:     Effort: Pulmonary effort is normal. No respiratory distress.     Breath sounds: Normal breath sounds. No wheezing, rhonchi or rales.  Abdominal:     General: Bowel sounds are normal. There is no distension.     Palpations: Abdomen is soft.     Tenderness: There is no abdominal tenderness.  Genitourinary:    Comments: Wearing male purewick- urine on darker side/almost medium beer color Musculoskeletal:     Cervical back: Neck supple.     Comments: RUE- 5/5 in biceps,t riceps, grip and FA LUE- slightly weaker- 5-/5 in same muscles RLE- HF 4/5; KE/KF 4+/5; DF/PF 5-/5 LLE-  HF 3-/5; KE 4/5; DF/PF 4+/5  Skin:    General: Skin is warm and dry.     Comments: Infiltrated IV that's been removed in L AC fossa- large pore opening appearance- maybe ~ 3-4 mm opening- with associate slough- and appears to be slightly warm and maybe cellulitis R forearm IV looks OK  Neurological:     Comments: Patient is alert however appears fatigued.  Oriented x 3 and follows commands Intact to light touch in all  4 extremities   Psychiatric:     Comments: Very flat affect- slightly delayed responses     Results for orders placed or performed during the hospital encounter of 10/30/22 (from the past 48 hour(s))  Glucose, capillary     Status: Abnormal   Collection Time: 11/17/22  4:53 PM  Result Value Ref Range   Glucose-Capillary 106 (H) 70 - 99 mg/dL    Comment: Glucose reference range applies only to samples taken after fasting for at least 8 hours.  Glucose, capillary     Status: Abnormal    Collection Time: 11/17/22  7:57 PM  Result Value Ref Range   Glucose-Capillary 118 (H) 70 - 99 mg/dL    Comment: Glucose reference range applies only to samples taken after fasting for at least 8 hours.  Glucose, capillary     Status: Abnormal   Collection Time: 11/18/22 12:00 AM  Result Value Ref Range   Glucose-Capillary 111 (H) 70 - 99 mg/dL    Comment: Glucose reference range applies only to samples taken after fasting for at least 8 hours.  Basic metabolic panel     Status: Abnormal   Collection Time: 11/18/22  1:41 AM  Result Value Ref Range   Sodium 129 (  L) 135 - 145 mmol/L   Potassium 4.5 3.5 - 5.1 mmol/L   Chloride 101 98 - 111 mmol/L   CO2 19 (L) 22 - 32 mmol/L   Glucose, Bld 98 70 - 99 mg/dL    Comment: Glucose reference range applies only to samples taken after fasting for at least 8 hours.   BUN 22 8 - 23 mg/dL   Creatinine, Ser 0.86 (H) 0.61 - 1.24 mg/dL   Calcium 8.0 (L) 8.9 - 10.3 mg/dL   GFR, Estimated 59 (L) >60 mL/min    Comment: (NOTE) Calculated using the CKD-EPI Creatinine Equation (2021)    Anion gap 9 5 - 15    Comment: Performed at [facility], [address] [place], [place] [address]  CBC     Status: Abnormal   Collection Time: 11/18/22  1:41 AM  Result Value Ref Range   WBC 13.3 (H) 4.0 - 10.5 K/uL   RBC 4.95 4.22 - 5.81 MIL/uL   Hemoglobin 12.7 (L) 13.0 - 17.0 g/dL   HCT 96.2 95.2 - 84.1 %   MCV 78.8 (L) 80.0 - 100.0 fL   MCH 25.7 (L) 26.0 - 34.0 pg   MCHC 32.6 30.0 - 36.0 g/dL   RDW 32.4 (H) 40.1 - 02.7 %   Platelets 430 (H) 150 - 400 K/uL   nRBC 0.4 (H) 0.0 - 0.2 %    Comment: Performed at [facility], [address], [place], [place] [address]  Glucose, capillary     Status: None   Collection Time: 11/18/22  3:53 AM  Result Value Ref Range   Glucose-Capillary 96 70 - 99 mg/dL    Comment: Glucose reference range applies only to samples taken after fasting for at least 8 hours.  Glucose, capillary     Status: Abnormal    Collection Time: 11/18/22  8:15 AM  Result Value Ref Range   Glucose-Capillary 100 (H) 70 - 99 mg/dL    Comment: Glucose reference range applies only to samples taken after fasting for at least 8 hours.  Glucose, capillary     Status: None   Collection Time: 11/18/22 11:50 AM  Result Value Ref Range   Glucose-Capillary 93 70 - 99 mg/dL    Comment: Glucose reference range applies only to samples taken after fasting for at least 8 hours.  Glucose, capillary     Status: None   Collection Time: 11/18/22  4:12 PM  Result Value Ref Range   Glucose-Capillary 96 70 - 99 mg/dL    Comment: Glucose reference range applies only to samples taken after fasting for at least 8 hours.  Glucose, capillary     Status: None   Collection Time: 11/18/22  8:01 PM  Result Value Ref Range   Glucose-Capillary 99 70 - 99 mg/dL    Comment: Glucose reference range applies only to samples taken after fasting for at least 8 hours.  Glucose, capillary     Status: None   Collection Time: 11/18/22 11:32 PM  Result Value Ref Range   Glucose-Capillary 96 70 - 99 mg/dL    Comment: Glucose reference range applies only to samples taken after fasting for at least 8 hours.  Basic metabolic panel     Status: Abnormal   Collection Time: 11/19/22  3:26 AM  Result Value Ref Range   Sodium 128 (L) 135 - 145 mmol/L   Potassium 4.3 3.5 - 5.1 mmol/L   Chloride 102 98 - 111 mmol/L  CO2 19 (L) 22 - 32 mmol/L   Glucose, Bld 99 70 - 99 mg/dL    Comment: Glucose reference range applies only to samples taken after fasting for at least 8 hours.   BUN 19 8 - 23 mg/dL   Creatinine, Ser 4.69 0.61 - 1.24 mg/dL   Calcium 8.0 (L) 8.9 - 10.3 mg/dL   GFR, Estimated >62 >95 mL/min    Comment: (NOTE) Calculated using the CKD-EPI Creatinine Equation (2021)    Anion gap 7 5 - 15    Comment: Performed at [facility], [address], [place], [place] [address]  Glucose, capillary     Status: None   Collection Time: 11/19/22  3:49 AM   Result Value Ref Range   Glucose-Capillary 99 70 - 99 mg/dL    Comment: Glucose reference range applies only to samples taken after fasting for at least 8 hours.  Glucose, capillary     Status: Abnormal   Collection Time: 11/19/22  9:00 AM  Result Value Ref Range   Glucose-Capillary 127 (H) 70 - 99 mg/dL    Comment: Glucose reference range applies only to samples taken after fasting for at least 8 hours.   No results found.    Blood pressure (!) 155/91, pulse (!) 102, temperature 98.7 F (37.1 C), temperature source Oral, resp. rate 20, height 6\' 3"  (1.905 m), weight 87.8 kg, SpO2 96%.  Medical Problem List and Plan: 1. Functional deficits secondary to acute bilateral multifocal embolic/ischemic infarcts after ruptured type B aortic dissection status post emergent T EVAR complicated by spinal cord ischemia  -patient may  shower- if covers dressing/incision- be careful  -ELOS/Goals: 14-18 days min A to supervision 2.  Antithrombotics: -DVT/anticoagulation:  Pharmaceutical: Heparin  -antiplatelet therapy: Aspirin 81 mg daily 3. Pain Management: Oxycodone as needed- pt c/o "skin hurting"- might be worth doing nerve pain meds? 4. Mood/Behavior/Sleep: Provide emotional support  -antipsychotic agents: N/A 5. Neuropsych/cognition: This patient is capable of making decisions on his own behalf. 6. Skin/Wound Care: Routine skin checks 7. Fluids/Electrolytes/Nutrition: Routine in and outs with follow-up chemistries 8.  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  Follow-up outpatient Dr. Mosetta Putt.  Currently not a candidate for more cancer treatment 9.  History of prostate cancer with prostatectomy.  Follow-up outpatient- pt has had leakage for 20 years since surgery done- wears briefs 10.  AKI.  Follow-up chemistries. 11.  Hypertension.  Norvasc 10 mg daily.,  Toprol-XL 12.5 mg daily 12.  Hyperlipidemia.  Lipitor 13.  History of tobacco use.  Quit smoking 3 months ago.  Provide  counseling 14. DNR- made DNR and plans for outpt with hospice when discharged from CIR.  15. Chronic nausea- on oral meds- helpful, but not resolved 16. Cough- notes better with new cough meds. Will monitor for any worsening of Sx's.   Mcarthur Rossetti Angiulli, PA-C 11/19/2022   I have personally performed a face to face diagnostic evaluation of this patient and formulated the key components of the plan.  Additionally, I have personally reviewed laboratory data, imaging studies, as well as relevant notes and concur with the physician assistant's documentation above.   The patient's status has not changed from the original H&P.  Any changes in documentation from the acute care chart have been noted above.

## 2022-11-19 NOTE — Progress Notes (Signed)
Inpatient Rehab Admissions Coordinator:    I have a CIR bed and will admit this Pt. Today. RN may call report to [phone].   Pt. In agreement to admit to CIR for an ELOS of 10-12 days with the goal of reaching supervision level and discharging home with his wife.   Megan Salon, MS, CCC-SLP Rehab Admissions Coordinator  [phone] (celll) [phone] (office)

## 2022-11-19 NOTE — TOC Transition Note (Signed)
Transition of Care (TOC) - CM/SW Discharge Note Donn Pierini RN, BSN Transitions of Care Unit 4E- RN Case Manager See Treatment Team for direct phone #   Patient Details  Name: Mathew [name] MRN: [id number] Date of Birth: Sep 15, 1950  Transition of Care [facility]) CM/SW Contact:  Darrold Span, RN Phone Number: 11/19/2022, 3:05 PM   Clinical Narrative:    CM has confirmed with CIR liaison pt has insurance auth and bed available today for admission to INPT rehab. Pt stable for transition to INPT rehab and will transfer later today.   No further TOC needs noted.    Final next level of care: IP Rehab Facility Barriers to Discharge: Barriers Resolved   Patient Goals and CMS Choice    Rehab then return home  Discharge Placement                 [facility] INPT rehab        Discharge Plan and Services Additional resources added to the After Visit Summary for     Discharge Planning Services: CM Consult Post Acute Care Choice: IP Rehab          DME Arranged: N/A DME Agency: NA       HH Arranged: NA HH Agency: NA        Social Determinants of Health (SDOH) Interventions SDOH Screenings   Food Insecurity: No Food Insecurity (11/13/2022)  Housing: Low Risk  (11/13/2022)  Transportation Needs: No Transportation Needs (11/13/2022)  Utilities: Not At Risk (11/13/2022)  Social Connections: Unknown (09/14/2021)   Received from [facility]  Tobacco Use: Medium Risk (10/30/2022)     Readmission Risk Interventions    11/19/2022    3:05 PM  Readmission Risk Prevention Plan  Transportation Screening Complete  Medication Review Oceanographer) Complete  HRI or Home Care Consult Complete  SW Recovery Care/Counseling Consult Complete  Palliative Care Screening Complete  Skilled Nursing Facility Not Applicable

## 2022-11-19 NOTE — Progress Notes (Signed)
PMR Admission Coordinator Pre-Admission Assessment   Patient: Mathew [name] is an 72 y.o., male MRN: [id number] DOB: 07/07/1950 Height: 6\' 3"  (190.5 cm) Weight: 91 kg   Insurance Information HMO:     PPO:  yes    PCP:      IPA:      80/20: yes     OTHER:  PRIMARY: BCBS Commercial       Policy#: [id number]       Subscriber: Pt CM Name: naveneet       Phone#: [phone] [id number]     Fax#: [phone] Approved on 7/16 for admit 7-16-7/22 with update due 5/57 Pre-Cert#:  [id number].      Employer:  Benefits:  Phone #:  Eff Date: 05/06/2022 - 05/06/2023 Deductible: $1,250 ($1,250 met) OOP Max: $4,000 ($4,000 met) CIR: 80% coverage; 20% co-insurance SNF: 80% coverage; 20% co-insurance, limited to 60 days/cal yr Outpatient:  80% coverage; 20% co-insurance Home Health:  80% coverage; 20% co-insurance DME: 80% coverage; 20% co-insurance Providers: in network    SECONDARY: Medicare Part A       Policy#:  [id number]     Phone#:    Artist:       Phone#:    The Engineer, materials Information Summary" for patients in Inpatient Rehabilitation Facilities with attached "Privacy Act Statement-Health Care Records" was provided and verbally reviewed with: Patient   Emergency Contact Information Contact Information       Name Relation Home Work Mobile    [name] Spouse [phone]   [phone]           Current Medical History  Patient Admitting Diagnosis: Ruptured Thoracic Aortic Aneurysm, CVA History of Present Illness:Mathew [name] is a 72 year old right-handed male with history 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  Per chart review patient lives with spouse.  Two-level home bed bath main level 2 steps to entry.  Independent and working prior to admission.  Presented 10/25/2022 with chest pain radiating to  the abdomen.  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  He was started on nitroglycerin infusion.  Given need for tight hemodynamic control PCCM consulted for further management and admission.  VVS and cardiology consulted.  Initially required Esmolol drip, weaned off with the addition of multiple p.o. medications.  Blood pressures normalized and patient was deemed stable for discharge 10/29/2022 due to no expansion of dissection ultimately returning on  6/26 for worsening pain or imaging confirmed aortic rupture requiring endovascular repair by vascular surgery Dr. Chestine Spore.  Hospital course 11/01/2022 patient with left lower extremity weakness of acute onset.  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  CT angiogram head and neck showed no acute intracranial process no intracranial large vessel occlusion or significant stenosis.  No hemodynamically significant stenosis in the carotid and vertebral arteries.  Neurology follow-up and patient was cleared to begin low-dose aspirin.  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 Mosetta Putt and patient not felt to be a candidate for further cancer treatments at this time and patient is currently DNR.  Mild AKI  improved with gentle IV fluids latest creatinine 1.17.  Patient was cleared to begin subcutaneous heparin for DVT prophylaxis.  Palliative care was consulted to establish goals of care.  His diet has been advanced to regular consistency.  Therapy evaluations completed due to patient's deconditioning as well as left-sided weakness was admitted for a comprehensive rehab program.       Complete NIHSS TOTAL: 3   Patient's medical record from  [facility] has been reviewed by the rehabilitation admission coordinator and physician.   Past Medical History      Past Medical History:  Diagnosis Date   Hepatitis C      s/p treatment   Hypertension     Obstructive sleep apnea      unable to tolerate CPAP   Premature ventricular contraction     Prostate cancer (HCC) 2006          Has the patient had major surgery during 100 days prior to admission? Yes   Family History   family history includes Cancer in his brother.   Current Medications  Current Medications    Current Facility-Administered Medications:    (feeding supplement) PROSource Plus liquid 30 mL, 30 mL, Oral, BID BM, Azucena Fallen, MD, 30 mL at 11/10/22 1641   0.9 %  sodium chloride infusion, 500 mL, Intravenous, Once PRN, Rhyne, Samantha J, PA-C   0.9 %  sodium chloride infusion, 250 mL, Intravenous, Continuous, Chand, Sudham, MD, Stopped at 11/06/22 1304   alum & mag hydroxide-simeth (MAALOX/MYLANTA) 200-200-20 MG/5ML suspension 15-30 mL, 15-30 mL, Oral, Q2H PRN, Nada Libman, MD   aspirin chewable tablet 81 mg, 81 mg, Oral, Daily, Steffanie Dunn, DO, 81 mg at 11/11/22 1122   atorvastatin (LIPITOR) tablet 80 mg, 80 mg, Oral, QHS, Rhyne, Samantha J, PA-C, 80 mg at 11/10/22 2151   bacitracin ointment, , Topical, BID, Baglia, Corrina, PA-C, 31.5 Application at 11/10/22 2143   bisacodyl (DULCOLAX) suppository 10 mg, 10 mg, Rectal, Daily PRN, Janeann Forehand D, NP, 10 mg at 11/04/22 1013   docusate sodium (COLACE) capsule 100 mg, 100 mg, Oral, BID PRN, Briant Sites, DO   docusate sodium (COLACE) capsule 100 mg, 100 mg, Oral, Daily, Nada Libman, MD, 100 mg at 11/10/22 1027   feeding supplement (BOOST / RESOURCE BREEZE) liquid 1 Container, 1 Container, Oral, TID BM, Azucena Fallen, MD, 1 Container at 11/10/22 1641   heparin injection 5,000 Units, 5,000 Units, Subcutaneous, Q8H, Rhyne, Samantha J, PA-C, 5,000 Units at 11/11/22  0657   HYDROmorphone (DILAUDID) injection 0.5 mg, 0.5 mg, Intravenous, Q2H PRN, Rhyne, Samantha J, PA-C, 0.5 mg at 11/01/22 0640   insulin aspart (novoLOG) injection 0-9 Units, 0-9 Units, Subcutaneous, TID AC & HS, Azucena Fallen, MD, 1 Units at 11/10/22 2151   loperamide (IMODIUM) capsule 2 mg, 2 mg, Oral, PRN, Azucena Fallen, MD, 2 mg at 11/11/22 1122   melatonin tablet 3 mg, 3 mg, Oral, QHS, Harris, Whitney D, NP, 3 mg at 11/10/22 2151   ondansetron (ZOFRAN) injection 4 mg, 4 mg, Intravenous, Q6H PRN, Nada Libman, MD, 4 mg at 11/10/22 1950   Oral care mouth rinse, 15 mL, Mouth Rinse, PRN, Briant Sites, DO   oxyCODONE-acetaminophen (PERCOCET/ROXICET) 5-325 MG per tablet 1-2 tablet, 1-2 tablet, Oral, Q4H PRN, Rhyne, Samantha J, PA-C, 2 tablet at 11/08/22 0751   pantoprazole (PROTONIX) EC tablet 40 mg, 40 mg, Oral, Daily, Nada Libman, MD, 40 mg at 11/11/22 1122  phenol (CHLORASEPTIC) mouth spray 1 spray, 1 spray, Mouth/Throat, PRN, Rhyne, Samantha J, PA-C, 1 spray at 11/06/22 1807   polyethylene glycol (MIRALAX / GLYCOLAX) packet 17 g, 17 g, Oral, Daily, Harris, Whitney D, NP, 17 g at 11/07/22 0857   promethazine (PHENERGAN) 12.5 mg in sodium chloride 0.9 % 50 mL IVPB, 12.5 mg, Intravenous, Q6H PRN, Azucena Fallen, MD   senna (SENOKOT) tablet 8.6 mg, 1 tablet, Oral, Daily PRN, Steffanie Dunn, DO   traZODone (DESYREL) tablet 50 mg, 50 mg, Oral, QHS PRN, Janeann Forehand D, NP, 50 mg at 11/04/22 2237     Patients Current Diet:  Diet Order                  DIET DYS 3 Room service appropriate? Yes; Fluid consistency: Thin  Diet effective now                         Precautions / Restrictions Precautions Precautions: Fall Precaution Comments: cortrak, O2, NG tube Restrictions Weight Bearing Restrictions: No    Has the patient had 2 or more falls or a fall with injury in the past year? No   Prior Activity Level Community (5-7x/wk): Pt. active in the  community PTA   Prior Functional Level Self Care: Did the patient need help bathing, dressing, using the toilet or eating? Independent   Indoor Mobility: Did the patient need assistance with walking from room to room (with or without device)? Independent   Stairs: Did the patient need assistance with internal or external stairs (with or without device)? Independent   Functional Cognition: Did the patient need help planning regular tasks such as shopping or remembering to take medications? Independent   Patient Information Are you of Hispanic, Latino/a,or Spanish origin?: A. No, not of Hispanic, Latino/a, or Spanish origin What is your race?: B. Black or African American Do you need or want an interpreter to communicate with a doctor or health care staff?: 0. No   Patient's Response To:  Health Literacy and Transportation Is the patient able to respond to health literacy and transportation needs?: Yes Health Literacy - How often do you need to have someone help you when you read instructions, pamphlets, or other written material from your doctor or pharmacy?: Never In the past 12 months, has lack of transportation kept you from medical appointments or from getting medications?: No In the past 12 months, has lack of transportation kept you from meetings, work, or from getting things needed for daily living?: No   Home Assistive Devices / Equipment Home Equipment: None   Prior Device Use: Indicate devices/aids used by the patient prior to current illness, exacerbation or injury? None of the above   Current Functional Level Cognition   Arousal/Alertness: Awake/alert Overall Cognitive Status: Within Functional Limits for tasks assessed Orientation Level: Oriented X4 Following Commands: Follows one step commands with increased time General Comments: pt very fatigued, talks softly, requires constant verbal cues to complete task Attention: Sustained Sustained Attention:  Impaired Sustained Attention Impairment: Verbal complex Memory: Impaired Memory Impairment: Storage deficit Problem Solving: Impaired Problem Solving Impairment: Verbal complex, Functional basic Executive Function: Organizing Organizing: Impaired Organizing Impairment: Verbal basic, Verbal complex    Extremity Assessment (includes Sensation/Coordination)   Upper Extremity Assessment: Generalized weakness (mild weakness compared to RUE, functional for BADL, able to brush teeth, pt is RHD)  Lower Extremity Assessment: Defer to PT evaluation LLE Deficits / Details: grossly 1/5, reports normal sensation  ADLs   Overall ADL's : Needs assistance/impaired Eating/Feeding: Moderate assistance, Sitting, Bed level Grooming: Set up, Sitting, Wash/dry face Upper Body Bathing: Moderate assistance, Sitting, Bed level Lower Body Bathing: Maximal assistance, Sit to/from stand Lower Body Bathing Details (indicate cue type and reason): assistance with peri area back while standing Upper Body Dressing : Moderate assistance, Sitting, Bed level Lower Body Dressing: Maximal assistance, Sitting/lateral leans, Bed level Toilet Transfer: Maximal assistance Toilet Transfer Details (indicate cue type and reason): with use of RW, mod A for [employer]- Clothing Manipulation and Hygiene: Total assistance Functional mobility during ADLs: Moderate assistance     Mobility   Overal bed mobility: Needs Assistance Bed Mobility: Rolling, Sidelying to Sit Rolling: Min guard Sidelying to sit: Min assist Supine to sit: Min assist, HOB elevated Sit to supine: Min assist Sit to sidelying: Max assist, +2 for physical assistance General bed mobility comments: hand hold when attemtping to pull into sitting     Transfers   Overall transfer level: Needs assistance Equipment used: Ambulation equipment used, Rolling walker (2 wheels) Transfers: Sit to/from Stand, Bed to chair/wheelchair/BSC Sit to Stand: Mod  assist, Max assist Bed to/from chair/wheelchair/BSC transfer type:: Squat pivot, Step pivot Squat pivot transfers: Mod assist Step pivot transfers: Mod assist Transfer via Lift Equipment: Stedy General transfer comment: modA for initial transfers, PT provides cues for foot placement, trunk flexion, rocking to gain momentum, and hand placement. minA sit to stand with cues for technique from elevated surface.     Ambulation / Gait / Stairs / Wheelchair Mobility   Ambulation/Gait General Gait Details: unable Pre-gait activities: pt steps from bedside commode to chair, tends to sink into knee and hip flexion with stepping     Posture / Balance Dynamic Sitting Balance Sitting balance - Comments: sitting in recliner Balance Overall balance assessment: Needs assistance Sitting-balance support: No upper extremity supported, Feet supported Sitting balance-Leahy Scale: Fair Sitting balance - Comments: sitting in recliner Standing balance support: Bilateral upper extremity supported, Reliant on assistive device for balance Standing balance-Leahy Scale: Poor Standing balance comment: minA with BUE support     Special needs/care consideration Skin surgical incision, sternal precautions    Previous Home Environment (from acute therapy documentation) Living Arrangements: Spouse/significant other  Lives With: Spouse Available Help at Discharge: Family, Available PRN/intermittently (can work toward 24/7 assist) Type of Home: House Home Layout: Two level, Able to live on main level with bedroom/bathroom, Bed/bath upstairs (only half bath is downstairs) Alternate Level Stairs-Rails: Can reach both Alternate Level Stairs-Number of Steps: flight Home Access: Ramped entrance, Stairs to enter Entrance Stairs-Rails: None Entrance Stairs-Number of Steps: 2 Bathroom Shower/Tub: Health visitor: Standard   Discharge Living Setting Plans for Discharge Living Setting: Patient's home Type  of Home at Discharge: House Discharge Home Layout: Two level, Able to live on main level with bedroom/bathroom, 1/2 bath on main level Alternate Level Stairs-Rails: None Alternate Level Stairs-Number of Steps: full flight Discharge Home Access: Stairs to enter Entrance Stairs-Rails: Right, Left Entrance Stairs-Number of Steps: 2 Discharge Bathroom Shower/Tub: Tub/shower unit Discharge Bathroom Toilet: Standard Discharge Bathroom Accessibility: No Does the patient have any problems obtaining your medications?: No   Social/Family/Support Systems Patient Roles: Spouse Contact Information: [phone] Anticipated Caregiver: Murel Shenberger Anticipated Caregiver's Contact Information: [phone] Ability/Limitations of Caregiver: Can doe 24/7 Min A Caregiver Availability: 24/7 Discharge Plan Discussed with Primary Caregiver: Yes Is Caregiver In Agreement with Plan?: Yes Does Caregiver/Family have Issues with Lodging/Transportation while Pt is in Rehab?: No  Goals Patient/Family Goal for Rehab: PT/OT Min A Expected length of stay: 14-16 days Pt/Family Agrees to Admission and willing to participate: Yes Program Orientation Provided & Reviewed with Pt/Caregiver Including Roles  & Responsibilities: Yes   Decrease burden of Care through IP rehab admission: not anticipated    Possible need for SNF placement upon discharge: not anticipated   Patient Condition: I have reviewed medical records from [facility] , spoken with CM, and patient and spouse. I met with patient at the bedside for inpatient rehabilitation assessment.  Patient will benefit from ongoing PT and OT, can actively participate in 3 hours of therapy a day 5 days of the week, and can make measurable gains during the admission.  Patient will also benefit from the coordinated team approach during an Inpatient Acute Rehabilitation admission.  The patient will receive intensive therapy as well as Rehabilitation  physician, nursing, social worker, and care management interventions.  Due to safety, skin/wound care, disease management, medication administration, pain management, and patient education the patient requires 24 hour a day rehabilitation nursing.  The patient is currently min-mod A with mobility and basic ADLs.  Discharge setting and therapy post discharge at home with home health is anticipated.  Patient has agreed to participate in the Acute Inpatient Rehabilitation Program and will admit today.   Preadmission Screen Completed By:  Jeronimo Greaves, 11/11/2022 1:10 PM ______________________________________________________________________   Discussed status with Dr. Berline Chough  on 11/19/22 at 930 and received approval for admission today.   Admission Coordinator:  Jeronimo Greaves, CCC-SLP, time 1000/Date 11/19/22    Assessment/Plan: Diagnosis: Does the need for close, 24 hr/day Medical supervision in concert with the patient's rehab needs make it unreasonable for this patient to be served in a less intensive setting? Yes Co-Morbidities requiring supervision/potential complications: Aortic aneurysm s/p grafting, HCC stage IV-  with mets - Hep C; PAF; smoker- just quit; prior prostectomy- due to prostate CA;  Due to bladder management, bowel management, safety, skin/wound care, disease management, medication administration, pain management, and patient education, does the patient require 24 hr/day rehab nursing? Yes Does the patient require coordinated care of a physician, rehab nurse, PT, OT, and SLP to address physical and functional deficits in the context of the above medical diagnosis(es)? Yes Addressing deficits in the following areas: balance, endurance, locomotion, strength, transferring, bowel/bladder control, bathing, dressing, feeding, grooming, and toileting Can the patient actively participate in an intensive therapy program of at least 3 hrs of therapy 5 days a week? Yes The potential for patient  to make measurable gains while on inpatient rehab is good Anticipated functional outcomes upon discharge from inpatient rehab: supervision and min assist PT, supervision and min assist OT, n/a SLP Estimated rehab length of stay to reach the above functional goals is: 14-18 days Anticipated discharge destination: Home 10. Overall Rehab/Functional Prognosis: good     MD Signature:

## 2022-11-19 NOTE — Progress Notes (Signed)
Inpatient Rehabilitation Admission Medication Review by a Pharmacist  A complete drug regimen review was completed for this patient to identify any potential clinically significant medication issues.  High Risk Drug Classes Is patient taking? Indication by Medication  Antipsychotic No   Anticoagulant Yes Sq heparin - VTE ppx  Antibiotic No   Opioid Yes Percocet prn pain  Antiplatelet No   Hypoglycemics/insulin No   Vasoactive Medication Yes Amlodipine, metoprolol - HTN  Chemotherapy No   Other Yes Pantoprazole, metoclopramide - Reflux  Atorvastatin - HLD Benzonatate - prn cough     Type of Medication Issue Identified Description of Issue Recommendation(s)  Drug Interaction(s) (clinically significant)     Duplicate Therapy     Allergy     No Medication Administration End Date     Incorrect Dose     Additional Drug Therapy Needed     Significant med changes from prior encounter (inform family/care partners about these prior to discharge).    Other       Clinically significant medication issues were identified that warrant physician communication and completion of prescribed/recommended actions by midnight of the next day:  No  Name of provider notified for urgent issues identified:   Provider Method of Notification:     Pharmacist comments: None  Time spent performing this drug regimen review (minutes):  20 minutes  Thank you Okey Regal, PharmD

## 2022-11-19 NOTE — Progress Notes (Signed)
Physical Therapy Treatment Patient Details Name: Mathew [name] MRN: [id number] DOB: Mar 07, 1951 Today's Date: 11/19/2022   History of Present Illness Pt is a 72 y/o M presenting to ED on 6/21 with chest pain and EKG with concern for ST elevation d/c'd 6/25, returned 6/26 and was urgently taken to OR. 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  Post op day 1 pt with L sided weakness, concern for spinal cord ischemia vs CVA. 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    PT Comments  Pt received in supine and agreeable to session. Pt reporting continued neck pain at the beginning of the session and manual massage is performed to decrease tension and pain during functional mobility tasks. Pt requires increased time to initiate and perform tasks due to fatigue and decreased activity tolerance. Pt requests to use the BSC, but is unable to have BM. Pt able to tolerate increased gait distance this session with min A for upright posture and dense cues for RW proximity with pt demonstrating trunk flexion.  When turning to sit on EOB, pt reaches to the bed's foot rail and sits to EOB sideways despite cues and pt reports "my low back was giving out". Pt educated on importance of keeping RW with him and completing turn before sitting for safety. Pt continues to benefit from PT services to progress toward functional mobility goals.     Assistance Recommended at Discharge Frequent or constant Supervision/Assistance  If plan is discharge home, recommend the following:  Can travel by private vehicle    A lot of help with walking and/or transfers;A lot of help with bathing/dressing/bathroom;Assist for  transportation;Help with stairs or ramp for entrance      Equipment Recommendations  Wheelchair (measurements PT);Wheelchair cushion (measurements PT);Rolling walker (2 wheels);BSC/3in1    Recommendations for Other Services       Precautions / Restrictions Precautions Precautions: Fall Restrictions Weight Bearing Restrictions: Yes LLE Weight Bearing: Weight bearing as tolerated     Mobility  Bed Mobility Overal bed mobility: Needs Assistance Bed Mobility: Supine to Sit, Sit to Supine     Supine to sit: Min guard, HOB elevated Sit to supine: Min guard   General bed mobility comments: increased time and rest breaks, but no physical assist needed    Transfers Overall transfer level: Needs assistance Equipment used: Rolling walker (2 wheels) Transfers: Sit to/from Stand, Bed to chair/wheelchair/BSC Sit to Stand: Min assist, Min guard, From elevated surface           General transfer comment: From elevated EOB x2 and BSC x1 with min A for power up progressing to min guard with elevated surface. Cues for safety due to pt releasing RW and sitting sideways to EOB before fully turning around despite cues    Ambulation/Gait Ambulation/Gait assistance: Min assist Gait Distance (Feet): 50 Feet Assistive device: Rolling walker (2 wheels) Gait Pattern/deviations: Step-through pattern, Decreased stride length, Trunk flexed       General Gait Details: Pt demonstrating slow step-through pattern with short steps. Cues and min A for upright posture and RW proximity due to increased trunk flexion with progressed distance.       Balance Overall balance assessment: Needs assistance Sitting-balance support: Bilateral upper extremity supported,  Feet supported Sitting balance-Leahy Scale: Fair Sitting balance - Comments: sitting EOB   Standing balance support: Bilateral upper extremity supported, Reliant on assistive device for balance, During functional activity Standing  balance-Leahy Scale: Poor Standing balance comment: with RW support                            Cognition Arousal/Alertness: Awake/alert Behavior During Therapy: Flat affect Overall Cognitive Status: Difficult to assess                                 General Comments: Pt required repetition of most questions and cues and demonstrates some impairment in short-term memory as he did not recall education on neck ROM and exercises from previous OT session.        Exercises Other Exercises Other Exercises: Manual massage to neck to decrease tension and pain    General Comments        Pertinent Vitals/Pain Pain Assessment Pain Assessment: Faces Faces Pain Scale: Hurts even more Pain Location: neck Pain Descriptors / Indicators: Grimacing, Sore Pain Intervention(s): Monitored during session, Repositioned     PT Goals (current goals can now be found in the care plan section) Acute Rehab PT Goals Patient Stated Goal: get better PT Goal Formulation: With patient/family Time For Goal Achievement: 11/19/22 Potential to Achieve Goals: Good Progress towards PT goals: Progressing toward goals    Frequency    Min 1X/week      PT Plan Current plan remains appropriate       AM-PAC PT "6 Clicks" Mobility   Outcome Measure  Help needed turning from your back to your side while in a flat bed without using bedrails?: None Help needed moving from lying on your back to sitting on the side of a flat bed without using bedrails?: A Little Help needed moving to and from a bed to a chair (including a wheelchair)?: A Little Help needed standing up from a chair using your arms (e.g., wheelchair or bedside chair)?: A Little Help needed to walk in hospital room?: A Little Help needed climbing 3-5 steps with a railing? : Total 6 Click Score: 17    End of Session Equipment Utilized During Treatment: Gait belt Activity Tolerance: Patient tolerated treatment  well Patient left: in bed;with call bell/phone within reach;with family/visitor present Nurse Communication: Mobility status PT Visit Diagnosis: Unsteadiness on feet (R26.81);Muscle weakness (generalized) (M62.81);Difficulty in walking, not elsewhere classified (R26.2)     Time: 4098-1191 PT Time Calculation (min) (ACUTE ONLY): 38 min  Charges:    $Gait Training: 8-22 mins $Therapeutic Activity: 8-22 mins $Massage: 8-22 mins PT General Charges $$ ACUTE PT VISIT: 1 Visit                     Johny Shock, PTA Acute Rehabilitation Services Secure Chat Preferred  [phone]    Johny Shock 11/19/2022, 10:03 AM

## 2022-11-19 NOTE — Plan of Care (Signed)
  Problem: Consults Goal: RH STROKE PATIENT EDUCATION Description: See Patient Education module for education specifics  Outcome: Progressing   Problem: RH SAFETY Goal: RH STG ADHERE TO SAFETY PRECAUTIONS W/ASSISTANCE/DEVICE Description: STG Adhere to Safety Precautions With min Assistance/Device. Outcome: Progressing   Problem: RH KNOWLEDGE DEFICIT Goal: RH STG INCREASE KNOWLEDGE OF HYPERTENSION Description: Patient and wife will be able to manage care/HTN at discharge with medications and dietary modifications using educational resources independently Outcome: Progressing Goal: RH STG INCREASE KNOWLEDGE OF DYSPHAGIA/FLUID INTAKE Description: Patient and wife will be able to manage dysphagia/diet at discharge with medications and dietary modifications using educational resources independently Outcome: Progressing Goal: RH STG INCREASE KNOWLEGDE OF HYPERLIPIDEMIA Description: Patient and wife will be able to manage care/HLD at discharge with medications and dietary modifications using educational resources independently Outcome: Progressing Goal: RH STG INCREASE KNOWLEDGE OF STROKE PROPHYLAXIS Description: Patient and wife will be able to manage secondary risks at discharge with medications and dietary modifications using educational resources independently Outcome: Progressing

## 2022-11-20 DIAGNOSIS — R5381 Other malaise: Secondary | ICD-10-CM | POA: Diagnosis not present

## 2022-11-20 LAB — CBC WITH DIFFERENTIAL/PLATELET
Abs Immature Granulocytes: 0.13 10*3/uL — ABNORMAL HIGH (ref 0.00–0.07)
Basophils Absolute: 0.2 10*3/uL — ABNORMAL HIGH (ref 0.0–0.1)
Basophils Relative: 1 %
Eosinophils Absolute: 0.2 10*3/uL (ref 0.0–0.5)
Eosinophils Relative: 1 %
HCT: 37 % — ABNORMAL LOW (ref 39.0–52.0)
Hemoglobin: 12.1 g/dL — ABNORMAL LOW (ref 13.0–17.0)
Immature Granulocytes: 1 %
Lymphocytes Relative: 16 %
Lymphs Abs: 2.5 10*3/uL (ref 0.7–4.0)
MCH: 25.3 pg — ABNORMAL LOW (ref 26.0–34.0)
MCHC: 32.7 g/dL (ref 30.0–36.0)
MCV: 77.4 fL — ABNORMAL LOW (ref 80.0–100.0)
Monocytes Absolute: 2 10*3/uL — ABNORMAL HIGH (ref 0.1–1.0)
Monocytes Relative: 12 %
Neutro Abs: 11 10*3/uL — ABNORMAL HIGH (ref 1.7–7.7)
Neutrophils Relative %: 69 %
Platelets: 410 10*3/uL — ABNORMAL HIGH (ref 150–400)
RBC: 4.78 MIL/uL (ref 4.22–5.81)
RDW: 20.4 % — ABNORMAL HIGH (ref 11.5–15.5)
WBC: 16 10*3/uL — ABNORMAL HIGH (ref 4.0–10.5)
nRBC: 0.3 % — ABNORMAL HIGH (ref 0.0–0.2)

## 2022-11-20 LAB — COMPREHENSIVE METABOLIC PANEL
ALT: 17 U/L (ref 0–44)
AST: 125 U/L — ABNORMAL HIGH (ref 15–41)
Albumin: 1.5 g/dL — ABNORMAL LOW (ref 3.5–5.0)
Alkaline Phosphatase: 148 U/L — ABNORMAL HIGH (ref 38–126)
Anion gap: 6 (ref 5–15)
BUN: 16 mg/dL (ref 8–23)
CO2: 19 mmol/L — ABNORMAL LOW (ref 22–32)
Calcium: 7.7 mg/dL — ABNORMAL LOW (ref 8.9–10.3)
Chloride: 101 mmol/L (ref 98–111)
Creatinine, Ser: 1.07 mg/dL (ref 0.61–1.24)
GFR, Estimated: >60 mL/min (ref >60)
Glucose, Bld: 94 mg/dL (ref 70–99)
Potassium: 4.4 mmol/L (ref 3.5–5.1)
Sodium: 126 mmol/L — ABNORMAL LOW (ref 135–145)
Total Bilirubin: 1.5 mg/dL — ABNORMAL HIGH (ref 0.3–1.2)
Total Protein: 7.2 g/dL (ref 6.5–8.1)

## 2022-11-20 LAB — GLUCOSE, CAPILLARY: Glucose-Capillary: 134 mg/dL — ABNORMAL HIGH (ref 70–99)

## 2022-11-20 MED ORDER — MODAFINIL 100 MG PO TABS
100.0000 mg | ORAL_TABLET | Freq: Every day | ORAL | Status: DC
  Administered 2022-11-21 – 2022-11-28 (×8): 100 mg via ORAL
  Filled 2022-11-20 (×8): qty 1

## 2022-11-20 MED ORDER — MEDIHONEY WOUND/BURN DRESSING EX PSTE
1.0000 | PASTE | Freq: Every day | CUTANEOUS | Status: DC
  Administered 2022-11-20 – 2022-11-27 (×8): 1 via TOPICAL
  Filled 2022-11-20: qty 44

## 2022-11-20 MED ORDER — DOXYCYCLINE HYCLATE 100 MG PO TABS
100.0000 mg | ORAL_TABLET | Freq: Two times a day (BID) | ORAL | Status: AC
  Administered 2022-11-20 – 2022-11-24 (×10): 100 mg via ORAL
  Filled 2022-11-20 (×10): qty 1

## 2022-11-20 MED ORDER — MIRTAZAPINE 15 MG PO TABS
7.5000 mg | ORAL_TABLET | Freq: Every day | ORAL | Status: DC
  Administered 2022-11-20 – 2022-11-24 (×5): 7.5 mg via ORAL
  Filled 2022-11-20 (×6): qty 1

## 2022-11-20 NOTE — Progress Notes (Signed)
Inpatient Rehabilitation Care Coordinator Assessment and Plan Patient Details  Name: Mathew [name] MRN: [id number] Date of Birth: [birth year]  Today's Date: 11/20/2022  Hospital Problems: Principal Problem:   Debility Active Problems:   Acute thoracic aortic dissection [facility])  Past Medical History:  Past Medical History:  Diagnosis Date   Hepatitis C    s/p treatment   Hypertension    Obstructive sleep apnea    unable to tolerate CPAP   Premature ventricular contraction    Prostate cancer (HCC) 2006   Past Surgical History:  Past Surgical History:  Procedure Laterality Date   LOOP RECORDER INSERTION N/A 07/04/2016   Procedure: Loop Recorder Insertion;  Surgeon: Hillis Range, MD;  Location: [facility] INVASIVE CV LAB;  Service: Cardiovascular;  Laterality: N/A;   prostectomy     SPLENECTOMY, TOTAL     TEE WITHOUT CARDIOVERSION  10/30/2022   Procedure: TRANSESOPHAGEAL ECHOCARDIOGRAM;  Surgeon: Nada Libman, MD;  Location: [facility] OR;  Service: Vascular;;   THORACIC AORTIC ENDOVASCULAR STENT GRAFT N/A 10/30/2022   Procedure: THORACIC AORTIC ENDOVASCULAR STENT GRAFT with Left Subclavian artery revascularization with laser.;  Surgeon: Nada Libman, MD;  Location: [facility] OR;  Service: Vascular;  Laterality: N/A;   Social History:  reports that he quit smoking about 3 months ago. His smoking use included cigarettes. He started smoking about 40 years ago. He has a 40 pack-year smoking history. He has been exposed to tobacco smoke. He has never used smokeless tobacco. He reports current alcohol use of about 2.0 standard drinks of alcohol per week. He reports that he does not use drugs.  Family / Support Systems Marital Status: Married Patient Roles: Spouse, Parent, Other (Comment) (employee) Spouse/Significant Other: Melba  [phone] Children: Three children who are not involved Other Supports: Friends Anticipated Caregiver: Wife Ability/Limitations of Caregiver: Has some health issues and is  small 5'2 but feels can provide min assist level. Works a part time job but can quit if needed. Caregiver Availability: 24/7 Family Dynamics: Close with wife who is very involved and supportive. They have a few friends who are supportive. 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 Wife comes daily to provide support   Abuse/Neglect Abuse/Neglect Assessment Can Be Completed: Yes Physical Abuse: Denies Verbal Abuse: Denies Sexual Abuse: Denies Exploitation of patient/patient's resources: Denies Self-Neglect: Denies  Patient response to: Social Isolation - How often do you feel lonely or isolated from those around you?: Never  Emotional Status Pt's affect, behavior and adjustment status: Pt is motviated to take be able to get up the flight of stairs in his home. He has always been independent and taken care of himself even with his cancer and chemo. He hopes to get more mobile while here and not be a burden to his wife Recent Psychosocial Issues: other health issues-cancer Psychiatric History: No history with all he has dealt with and diagnosis of end stagfe cancer he would benefit from seeing neuro-psych while here Substance Abuse History: Remote history of tobacco  and no other issues  Patient / Family Perceptions, Expectations & Goals Pt/Family understanding of illness & functional limitations: Pt and wife can  explain his health issues and continue to speak with the involved MD's. Pt wants to get more mobiule and get home he wants to try to prolong his life as much as he can. Premorbid pt/family roles/activities: husband, father, cancer pt, employee, friend, etc Anticipated changes in roles/activities/participation: resume Pt/family expectations/goals: Pt states: " I want to be able to do for myself and not have to rely on my wife."  Wife states: " I hope he can move better and not be so much assist."  [employer]: Other (Comment) [facility]) Premorbid Home Care/DME Agencies: None Transportation available at discharge: self and wife Is the patient able to respond to transportation needs?: Yes In the past 12 months, has lack of transportation kept you from medical appointments or from getting medications?: No In the past 12 months, has lack of transportation kept you from meetings, work, or from getting things needed for daily living?: No Resource referrals recommended: Neuropsychology  Discharge Planning Living Arrangements: Spouse/significant other Support Systems: Spouse/significant other, Friends/neighbors Type of Residence: Private residence Insurance Resources: Media planner (specify) (BCBS and Medicare part A) Financial Resources: Employment, Engineer, site, Social Security Financial Screen Referred: No Living Expenses: Own Money Management: Patient, Spouse Does the patient have any problems obtaining your medications?: No Home Management: both mostly wife Patient/Family Preliminary Plans: Return home with wife who is able to assist but does have some helath issues and is of small stature compared to 6'3 pt. Aware being evaluated today anfd goals being set for stay here. Care Coordinator Barriers to Discharge: Insurance for SNF coverage Care Coordinator Anticipated Follow Up Needs: HH/OP, Other (comment) (hospice)  Clinical Impression Pleasant  gentleman who is fatigued from his first therapy of the day. He is motivated to push himself and get more mobile while here and be able to walk up a flight of stairs to get to his full bathroom at home. Wife is supportive and willing to assist. Will need to see if plan home with home health versus home with hospice. Await therapy evaluations  [name] 11/20/2022, 2:32 PM

## 2022-11-20 NOTE — Evaluation (Signed)
Physical Therapy Assessment and Plan  Patient Details  Name: Mathew [name] MRN: [id number] Date of Birth: 01/02/1951  PT Diagnosis: Abnormal posture, Abnormality of gait, Difficulty walking, Hemiplegia non-dominant, Low back pain, and Muscle weakness Rehab Potential: Fair ELOS: 10-12 days   Today's Date: 11/20/2022 PT Individual Time: 0800-0912 PT Individual Time Calculation (min): 72 min    Hospital Problem: Principal Problem:   Debility Active Problems:   Acute thoracic aortic dissection (HCC)   Past Medical History:  Past Medical History:  Diagnosis Date   Hepatitis C    s/p treatment   Hypertension    Obstructive sleep apnea    unable to tolerate CPAP   Premature ventricular contraction    Prostate cancer (HCC) 2006   Past Surgical History:  Past Surgical History:  Procedure Laterality Date   LOOP RECORDER INSERTION N/A 07/04/2016   Procedure: Loop Recorder Insertion;  Surgeon: Hillis Range, MD;  Location: [facility]IVE [facility];  Service: Cardiovascular;  Laterality: N/A;   prostectomy     SPLENECTOMY, TOTAL     TEE WITHOUT CARDIOVERSION  10/30/2022   Procedure: TRANSESOPHAGEAL ECHOCARDIOGRAM;  Surgeon: Nada Libman, MD;  Location: [facility] OR;  Service: Vascular;;   THORACIC AORTIC ENDOVASCULAR STENT GRAFT N/A 10/30/2022   Procedure: THORACIC AORTIC ENDOVASCULAR STENT GRAFT with Left Subclavian artery revascularization with laser.;  Surgeon: Nada Libman, MD;  Location: [facility] OR;  Service: Vascular;  Laterality: N/A;    Assessment & Plan Clinical Impression: Patient is a 72 year old right-handed male with history 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 Per chart review patient lives with spouse. Two-level home bed bath main level 2 steps to entry. Independent and working prior to admission. Presented 10/25/2022 with  chest pain radiating to the abdomen. 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 He was started on nitroglycerin infusion. Given need for tight hemodynamic control PCCM consulted for further management and admission. VVS and cardiology consulted. Initially required Esmolol drip, weaned off with the addition of multiple p.o. medications. Blood pressures normalized and patient was deemed stable for discharge 10/29/2022 due to no expansion of dissection ultimately returning on 6/26 for worsening pain or imaging confirmed aortic rupture requiring endovascular repair by vascular surgery Dr. Chestine Spore. Hospital course 11/01/2022 patient with left lower extremity weakness of acute onset. 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 CT angiogram head and neck showed no acute intracranial process no intracranial large vessel occlusion or significant stenosis. No hemodynamically significant stenosis in the carotid and vertebral arteries. Neurology follow-up and patient was cleared to begin low-dose aspirin. 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 Mosetta Putt and patient not felt to be a candidate for further cancer treatments at this time and patient is currently DNR. Mild AKI improved with gentle IV fluids latest creatinine 1.17. Patient was cleared to begin subcutaneous heparin for DVT prophylaxis. Palliative care was consulted to establish goals of care. His diet has been advanced to regular consistency. Therapy evaluations completed due to patient's deconditioning as well as left-sided weakness was admitted for a comprehensive rehab  program.  Patient transferred to CIR on 11/19/2022 .   Patient  currently requires min with mobility secondary to muscle weakness, decreased cardiorespiratoy endurance, and decreased standing balance, hemiplegia, and decreased balance strategies.  Prior to hospitalization, patient was modified independent  with mobility and lived with Spouse in a House home.  Home access is 2-4Stairs to enter.  Patient will benefit from skilled PT intervention to maximize safe functional mobility, minimize fall risk, and decrease caregiver burden for planned discharge home with 24 hour assist.  Anticipate patient will benefit from follow up [facility] at discharge.  PT - End of Session Activity Tolerance: Tolerates < 10 min activity, no significant change in vital signs Endurance Deficit: Yes PT Assessment Rehab Potential (ACUTE/IP ONLY): Fair PT Barriers to Discharge: Home environment access/layout;Lack of/limited family support;Incontinence;Insurance for SNF coverage PT Patient demonstrates impairments in the following area(s): Balance;Endurance;Motor;Pain;Safety;Skin Integrity PT Transfers Functional Problem(s): Bed Mobility;Bed to Chair;Car PT Locomotion Functional Problem(s): Ambulation;Stairs PT Plan PT Intensity: Minimum of 1-2 x/day ,45 to 90 minutes PT Frequency: 5 out of 7 days PT Duration Estimated Length of Stay: 10-12 days PT Treatment/Interventions: Ambulation/gait training;Discharge planning;Functional mobility training;Psychosocial support;Visual/perceptual remediation/compensation;Wheelchair propulsion/positioning;Therapeutic Exercise;Balance/vestibular training;Disease management/prevention;Neuromuscular re-education;Skin care/wound management;Cognitive remediation/compensation;DME/adaptive equipment instruction;Pain management;Splinting/orthotics;UE/LE Strength taining/ROM;Community reintegration;Functional electrical stimulation;Patient/family education;UE/LE Runner, broadcasting/film/video PT Transfers Anticipated Outcome(s): Supervision PT Locomotion  Anticipated Outcome(s): Supervision PT Recommendation Recommendations for Other Services: Neuropsych consult Follow Up Recommendations: Home health PT Patient destination: Home Equipment Recommended: To be determined   PT Evaluation Precautions/Restrictions Precautions Precautions: Fall Precaution Comments: General debility; cancer Restrictions Weight Bearing Restrictions: No Pain Interference Pain Interference Pain Effect on Sleep: 3. Frequently Pain Interference with Therapy Activities: 3. Frequently Pain Interference with Day-to-Day Activities: 3. Frequently Home Living/Prior Functioning Home Living Available Help at Discharge: Family;Available PRN/intermittently Type of Home: House Home Access: Stairs to enter [employer] of Steps: 2-4 Entrance Stairs-Rails: None Home Layout: Two level;Bed/bath upstairs;1/2 bath on main level Alternate Level Stairs-Number of Steps: flight Alternate Level Stairs-Rails: None  Lives With: Spouse Prior Function Level of Independence: Independent with transfers;Independent with gait;Independent with basic ADLs  Able to Take Stairs?: Yes Driving: Yes Vocation: Retired Gaffer: Retired Marketing executive - History Ability to See in Adequate Light: 0 Adequate Perception Perception: Within Functional Limits Praxis Praxis: Intact  Cognition Overall Cognitive Status: Within Functional Limits for tasks assessed Arousal/Alertness: Awake/alert Attention: Sustained;Focused;Selective Focused Attention: Appears intact Sustained Attention: Appears intact Selective Attention: Impaired Selective Attention Impairment: Verbal complex;Functional complex Memory: Appears intact Awareness: Appears intact Problem Solving: Appears intact Safety/Judgment: Appears intact Comments: Will continue to assess in functional context Sensation Sensation Light Touch: Appears Intact Hot/Cold: Appears  Intact Proprioception: Appears Intact Stereognosis: Not tested Coordination Gross Motor Movements are Fluid and Coordinated: No Coordination and Movement Description: Generalized weakness and deconditioning Motor  Motor Motor: Hemiplegia;Abnormal postural alignment and control Motor - Skilled Clinical Observations: mild L hemi; debility   Trunk/Postural Assessment  Cervical Assessment Cervical Assessment: Exceptions to [facility] (forward head posturing) Thoracic Assessment Thoracic Assessment: Exceptions to [facility] (rounded shoulders) Lumbar Assessment Lumbar Assessment: Exceptions to [facility] (posterior tilt) Postural Control Postural Control: Deficits on evaluation Trunk Control: posterior bias - mild  Balance Balance Balance Assessed: Yes Static Sitting Balance Static Sitting - Balance Support: Feet supported;No upper extremity supported Static Sitting - Level of Assistance: 5: Stand by assistance Dynamic Sitting Balance Dynamic Sitting - Balance Support: Feet supported;No upper extremity supported Dynamic Sitting - Level of Assistance: 4: Min Oncologist  Standing - Balance Support: Bilateral upper extremity supported Static Standing - Level of Assistance: Other (comment) (CGA) Dynamic Standing Balance Dynamic Standing - Balance Support: Bilateral upper extremity supported;During functional activity Dynamic Standing - Level of Assistance: 4: Min assist Extremity Assessment      RLE Assessment RLE Assessment: Exceptions to [facility] General Strength Comments: Grossly 4/5 LLE Assessment LLE Assessment: Exceptions to [facility] General Strength Comments: Grossly 4-/5  Care Tool Care Tool Bed Mobility Roll left and right activity   Roll left and right assist level: Supervision/Verbal cueing    Sit to lying activity   Sit to lying assist level: Minimal Assistance - Patient > 75%    Lying to sitting on side of bed activity   Lying to sitting on side of bed assist  level: the ability to move from lying on the back to sitting on the side of the bed with no back support.: Minimal Assistance - Patient > 75%     Care Tool Transfers Sit to stand transfer   Sit to stand assist level: Minimal Assistance - Patient > 75%    Chair/bed transfer   Chair/bed transfer assist level: Minimal Assistance - Patient > 75%     Scientist, research (physical sciences) transfer activity did not occur: Safety/medical concerns (fatigue)        Care Tool Locomotion Ambulation   Assist level: Minimal Assistance - Patient > 75% Assistive device: Walker-rolling Max distance: 59ft  Walk 10 feet activity   Assist level: Minimal Assistance - Patient > 75% Assistive device: Walker-rolling   Walk 50 feet with 2 turns activity   Assist level: Minimal Assistance - Patient > 75% Assistive device: Walker-rolling  Walk 150 feet activity Walk 150 feet activity did not occur: Safety/medical concerns (fatigue)      Walk 10 feet on uneven surfaces activity Walk 10 feet on uneven surfaces activity did not occur: Safety/medical concerns      Stairs   Assist level: Minimal Assistance - Patient > 75% Stairs assistive device: 2 hand rails Max number of stairs: 4  Walk up/down 1 step activity   Walk up/down 1 step (curb) assist level: Minimal Assistance - Patient > 75% Walk up/down 1 step or curb assistive device: 2 hand rails  Walk up/down 4 steps activity   Walk up/down 4 steps assist level: Minimal Assistance - Patient > 75% Walk up/down 4 steps assistive device: 2 hand rails  Walk up/down 12 steps activity Walk up/down 12 steps activity did not occur: Safety/medical concerns      Pick up small objects from floor Pick up small object from the floor (from standing position) activity did not occur: Safety/medical concerns      Wheelchair Is the patient using a wheelchair?: Yes Type of Wheelchair: Manual   Wheelchair assist level: Dependent - Patient 0%    Wheel 50 feet  with 2 turns activity   Assist Level: Dependent - Patient 0%  Wheel 150 feet activity   Assist Level: Dependent - Patient 0%    Refer to Care Plan for Long Term Goals  SHORT TERM GOAL WEEK 1 PT Short Term Goal 1 (Week 1): Pt will complete bed mobility with CGA and no hospital bed features PT Short Term Goal 2 (Week 1): Pt will complete bed<>chair transfers with CGA and LRAD PT Short Term Goal 3 (Week 1): Pt will ambulate 120ft with CGA and LRAD PT Short Term Goal 4 (Week 1): Pt will navigate  up/down x4 stairs with CGA and LRAD  Recommendations for other services: Neuropsych  Skilled Therapeutic Intervention Mobility Bed Mobility Bed Mobility: Sit to Supine;Supine to Sit Supine to Sit: Minimal Assistance - Patient > 75% Sit to Supine: Minimal Assistance - Patient > 75% Transfers Transfers: Sit to Stand;Stand to Sit;Stand Pivot Transfers Sit to Stand: Minimal Assistance - Patient > 75% Stand to Sit: Minimal Assistance - Patient > 75% Stand Pivot Transfers: Minimal Assistance - Patient > 75% Stand Pivot Transfer Details: Tactile cues for initiation;Verbal cues for technique;Manual facilitation for weight shifting;Verbal cues for precautions/safety;Verbal cues for safe use of DME/AE;Verbal cues for gait pattern;Verbal cues for sequencing Transfer (Assistive device): Rolling walker Locomotion  Gait Ambulation: Yes Gait Assistance: Contact Guard/Touching assist;Minimal Assistance - Patient > 75% Gait Distance (Feet): 75 Feet Assistive device: Rolling walker Gait Assistance Details: Verbal cues for gait pattern;Verbal cues for sequencing;Verbal cues for technique;Verbal cues for precautions/safety;Tactile cues for initiation;Tactile cues for posture Gait Gait: Yes Gait Pattern: Impaired Gait Pattern: Step-through pattern;Trunk flexed Stairs / Additional Locomotion Stairs: Yes Stairs Assistance: Minimal Assistance - Patient > 75% Stair Management Technique: Two rails;Step to  pattern;Forwards Number of Stairs: 4 Height of Stairs: 6 Wheelchair Mobility Wheelchair Mobility: No    Skilled Intervention: Pt resting in bed on arrival - awkens to voice and agreeable to PT evaluation. No complaints of resting pain but does endorse intermittent neck/back pain that sometimes keeps him up at night. Also reports poor appetite since hospitalization.   Spent time discussing CIR policies, PT goals, PT POC, typical schedule, etc. Also discussed patient's goals of "returning to normal life" and "being able to care for himself." But inquiring on "massage therapy" during CIR stay and educated on purpose of CIR for goals to return home, so mostly functional therapy interventions.   Retrieved manual wheelchair from DME closet to allow energy conservation b/w therapy gyms and his room. Patient requesting to toilet before leaving.   Bed mobility completed with minA and hospital bed features. Sit<>stand with minA from raised EOB (pt is 6'3) to RW and ambulates with CGA/minA to bathroom with RW. Poor controlled lowering to raised BSC over toilet. Pt continent x2. Returned to EOB to assist with dressing. MaxA for time management for LB/UB dressing, including donning socks and tennis shoes.   Transported in w/c to main rehab gym. Sit<>stand to RW with minA from w/c - cues for hand placement to push from wheelchair arm rests. Ambulates ~89ft with CGA/minA and RW - gait speed slowed - 0.23 m/s, indicative of increased falls risk and household ambulator.  Stair training with 6" steps and 2 hand rails, minA needed for navigating up/down x4 steps with cues for safety and sequencing. No knee buckling observed regardless of which foot leads.   Returned to his room and ended session in wheelchair - NT making bed so direct handoff of care.   Instructed pt in results of PT evaluation as detailed above, PT POC, rehab potential, rehab goals, and discharge recommendations. Additionally discussed CIR's  policies regarding fall safety and use of chair alarm and/or quick release belt. Pt verbalized understanding and in agreement. Will update pt's family members as they become available.   Discharge Criteria: Patient will be discharged from PT if patient refuses treatment 3 consecutive times without medical reason, if treatment goals not met, if there is a change in medical status, if patient makes no progress towards goals or if patient is discharged from hospital.  The above assessment, treatment plan, treatment  alternatives and goals were discussed and mutually agreed upon: by patient  Orrin Brigham  PT, DPT, CSRS  11/20/2022, 10:19 AM

## 2022-11-20 NOTE — Progress Notes (Signed)
 Inpatient Rehabilitation  Patient information reviewed and entered into eRehab system by Melissa M. Bowie, M.A., CCC/SLP, PPS Coordinator.  Information including medical coding, functional ability and quality indicators will be reviewed and updated through discharge.    

## 2022-11-20 NOTE — Progress Notes (Signed)
Patient c/o chest heaviness in center of chest. Patient rates chest heaviness 5/10. Patient denies shortness of breath at this time. Breathing is non labored. Lung sounds are clear/diminished. BP is 148/89, HR 101, SPO2 on room air is 96%. Respiratory rate is 18. On-call PA-C made aware. Order received to administer PRN pain medication and provide patient with K-Pad.

## 2022-11-20 NOTE — Plan of Care (Signed)
  Problem: RH Balance Goal: LTG Patient will maintain dynamic standing balance (PT) Description: LTG:  Patient will maintain dynamic standing balance with assistance during mobility activities (PT) Flowsheets (Taken 11/20/2022 1023) LTG: Pt will maintain dynamic standing balance during mobility activities with:: Supervision/Verbal cueing   Problem: Sit to Stand Goal: LTG:  Patient will perform sit to stand with assistance level (PT) Description: LTG:  Patient will perform sit to stand with assistance level (PT) Flowsheets (Taken 11/20/2022 1023) LTG: PT will perform sit to stand in preparation for functional mobility with assistance level: Supervision/Verbal cueing   Problem: RH Bed Mobility Goal: LTG Patient will perform bed mobility with assist (PT) Description: LTG: Patient will perform bed mobility with assistance, with/without cues (PT). Flowsheets (Taken 11/20/2022 1023) LTG: Pt will perform bed mobility with assistance level of: Supervision/Verbal cueing   Problem: RH Bed to Chair Transfers Goal: LTG Patient will perform bed/chair transfers w/assist (PT) Description: LTG: Patient will perform bed to chair transfers with assistance (PT). Flowsheets (Taken 11/20/2022 1023) LTG: Pt will perform Bed to Chair Transfers with assistance level: Supervision/Verbal cueing   Problem: RH Car Transfers Goal: LTG Patient will perform car transfers with assist (PT) Description: LTG: Patient will perform car transfers with assistance (PT). Flowsheets (Taken 11/20/2022 1023) LTG: Pt will perform car transfers with assist:: Minimal Assistance - Patient > 75%   Problem: RH Ambulation Goal: LTG Patient will ambulate in controlled environment (PT) Description: LTG: Patient will ambulate in a controlled environment, # of feet with assistance (PT). Flowsheets (Taken 11/20/2022 1023) LTG: Pt will ambulate in controlled environ  assist needed:: Supervision/Verbal cueing LTG: Ambulation distance in  controlled environment: 169ft Goal: LTG Patient will ambulate in home environment (PT) Description: LTG: Patient will ambulate in home environment, # of feet with assistance (PT). Flowsheets (Taken 11/20/2022 1023) LTG: Pt will ambulate in home environ  assist needed:: Supervision/Verbal cueing LTG: Ambulation distance in home environment: 50ft   Problem: RH Stairs Goal: LTG Patient will ambulate up and down stairs w/assist (PT) Description: LTG: Patient will ambulate up and down # of stairs with assistance (PT) Flowsheets (Taken 11/20/2022 1023) LTG: Pt will ambulate up/down stairs assist needed:: Minimal Assistance - Patient > 75% LTG: Pt will  ambulate up and down number of stairs: 2 without rails or as per home setup

## 2022-11-20 NOTE — Evaluation (Signed)
Speech Language Pathology Assessment and Plan  Patient Details  Name: Mathew [name] MRN: [id number] Date of Birth: 25-Apr-1951  SLP Diagnosis: Cognitive Impairments  Rehab Potential: Excellent ELOS: 7/29    Today's Date: 11/20/2022 SLP Individual Time: 1100-1200 SLP Individual Time Calculation (min): 60 min   Hospital Problem: Principal Problem:   Debility Active Problems:   Acute thoracic aortic dissection (HCC)  Past Medical History:  Past Medical History:  Diagnosis Date   Hepatitis C    s/p treatment   Hypertension    Obstructive sleep apnea    unable to tolerate CPAP   Premature ventricular contraction    Prostate cancer (HCC) 2006   Past Surgical History:  Past Surgical History:  Procedure Laterality Date   LOOP RECORDER INSERTION N/A 07/04/2016   Procedure: Loop Recorder Insertion;  Surgeon: Hillis Range, MD;  Location: [facility];  Service: Cardiovascular;  Laterality: N/A;   prostectomy     SPLENECTOMY, TOTAL     TEE WITHOUT CARDIOVERSION  10/30/2022   Procedure: TRANSESOPHAGEAL ECHOCARDIOGRAM;  Surgeon: Nada Libman, MD;  Location: [facility] OR;  Service: Vascular;;   THORACIC AORTIC ENDOVASCULAR STENT GRAFT N/A 10/30/2022   Procedure: THORACIC AORTIC ENDOVASCULAR STENT GRAFT with Left Subclavian artery revascularization with laser.;  Surgeon: Nada Libman, MD;  Location: [facility] OR;  Service: Vascular;  Laterality: N/A;    Assessment / Plan / Recommendation Clinical Impression HPI: Mathew [name] is a 72 year old right-handed male with history 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  Per chart review patient lives with spouse.  Two-level home bed bath main level 2 steps to entry.  Independent and working prior to admission.  Presented 10/25/2022 with chest pain radiating to the abdomen.  Initially there  was a STEMI code on further workup he was noted to have type B aortic dissection extending from the distal aortic arch to the level of the diaphragm per CT angiogram of the chest.  Hospital course 11/01/2022 patient with left lower extremity weakness of acute onset.  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  CT angiogram head and neck showed no acute intracranial process no intracranial large vessel occlusion or significant stenosis. His diet has been advanced to regular consistency.   Patient presents with [facility] oropharyngeal function based on bedside swallow evaluation. Oral mechanism exam WFL. No reports of s/sx of aspiration. Patient self fed thin liquids, purees, dys 3 textures, and solids with WFL oral phase functioning. No overt s/sx of aspiration noted during trials. Recommend regular textured diet with thin liquids. Medications may be administered whole in thin liquids. No supervision required. No further dysphagia management necessary.   Patient was administered the Cognistat to assess cognitive-linguistic function. Patient scored WFL on orientation, attention, registration, repetition, naming, visual construction, and judgement tasks. Mild deficits were noted in memory, calculations and reasoning/executive functioning. Patient was able to independently recall 1/4 vocabulary words during delayed recall, however required min-mod cues to identify the remainder. During mathematical tasks, patient unable to mentally compute subtraction/division tasks correctly. Patient demonstrated difficulty in similarities tasks, indicating deficits in abstract thought. Patient would benefit from skilled ST intervention to maximize memory and cognitive function in order to maximize functional independence at d/c. Anticipate patient will require intermittent supervision  at home and f/u  OP SLP services.    Skilled Therapeutic Interventions          Patient was evaluated via a standardized cognitive linguistic assessment and bedside swallow evaluation. See above for details.    SLP Assessment  Patient will need skilled Speech Lanaguage Pathology Services during CIR admission    Recommendations  SLP Diet Recommendations: Age appropriate regular solids;Thin Liquid Administration via: Straw;Cup;Spoon Medication Administration: Whole meds with liquid Supervision: Patient able to self feed Postural Changes and/or Swallow Maneuvers: Seated upright 90 degrees Oral Care Recommendations: Oral care BID Patient destination: Home Follow up Recommendations: Outpatient SLP Equipment Recommended: None recommended by SLP    SLP Frequency 1 to 3 out of 7 days   SLP Duration  SLP Intensity  SLP Treatment/Interventions 7/29  Minumum of 1-2 x/day, 30 to 90 minutes  Cognitive remediation/compensation;Internal/external aids;Therapeutic Activities;Cueing hierarchy;Functional tasks;Patient/family education;Therapeutic Exercise    Pain Pain Assessment Pain Scale: Faces Pain Score: 0-No pain Faces Pain Scale: No hurt  Prior Functioning Type of Home: House  Lives With: Spouse Available Help at Discharge: Family;Available PRN/intermittently Vocation: Retired  SLP Evaluation Cognition Overall Cognitive Status: Impaired/Different from baseline Arousal/Alertness: Awake/alert Orientation Level: Oriented X4 Year: 2024 Month: July Day of Week: Incorrect Attention: Sustained;Focused;Selective Focused Attention: Appears intact Sustained Attention: Appears intact Selective Attention: Appears intact Selective Attention Impairment: Verbal complex;Functional complex Memory: Impaired Memory Impairment: Decreased short term memory Decreased Short Term Memory: Verbal basic;Functional basic Awareness: Appears intact Problem Solving: Impaired Problem Solving Impairment: Functional  complex;Verbal complex Organizing: Appears intact Safety/Judgment: Appears intact Comments: Will continue to assess in functional context  Comprehension Auditory Comprehension Overall Auditory Comprehension: Appears within functional limits for tasks assessed Expression Expression Primary Mode of Expression: Verbal Verbal Expression Overall Verbal Expression: Appears within functional limits for tasks assessed Repetition: No impairment Naming: No impairment Pragmatics: No impairment Oral Motor Oral Motor/Sensory Function Overall Oral Motor/Sensory Function: Within functional limits Motor Speech Overall Motor Speech: Appears within functional limits for tasks assessed Respiration: Within functional limits Phonation: Normal Resonance: Within functional limits Articulation: Within functional limitis Intelligibility: Intelligible Motor Planning: Witnin functional limits Motor Speech Errors: Not applicable  Care Tool Care Tool Cognition Ability to hear (with hearing aid or hearing appliances if normally used Ability to hear (with hearing aid or hearing appliances if normally used): 0. Adequate - no difficulty in normal conservation, social interaction, listening to TV   Expression of Ideas and Wants Expression of Ideas and Wants: 4. Without difficulty (complex and basic) - expresses complex messages without difficulty and with speech that is clear and easy to understand   Understanding Verbal and Non-Verbal Content Understanding Verbal and Non-Verbal Content: 4. Understands (complex and basic) - clear comprehension without cues or repetitions  Memory/Recall Ability Memory/Recall Ability : Staff names and faces;That he or she is in a hospital/hospital unit;Location of own room   Bedside Swallowing Assessment General Diet Prior to this Study: Dysphagia 3 (mechanical soft) Behavior/Cognition: Alert;Cooperative;Pleasant mood Oral Cavity - Dentition: Adequate natural  dentition Self-Feeding Abilities: Able to feed self Patient Positioning: Upright in chair/Tumbleform Baseline Vocal Quality: Normal Volitional Cough: Strong Volitional Swallow: Able to elicit  Oral Care Assessment Oral Assessment  (WDL): Within Defined Limits Lips: Symmetrical Teeth: Intact Tongue: Pink Mucous Membrane(s): Moist Saliva: Moist, saliva free flowing Level of Consciousness: Alert Is patient on any of following O2 devices?: None of the above Nutritional status: Dysphagia Oral Assessment Risk : High Risk Ice Chips Ice chips: Not tested Thin Liquid Thin  Liquid: Within functional limits Presentation: Straw;Self Fed Nectar Thick Nectar Thick Liquid: Not tested Honey Thick Honey Thick Liquid: Not tested Puree Puree: Within functional limits Presentation: Self Fed;Spoon Solid Solid: Within functional limits Presentation: Self Fed BSE Assessment Risk for Aspiration Impact on safety and function: No limitations  Short Term Goals: Week 1: SLP Short Term Goal 1 (Week 1): Patient will demonstrate problem solving abilities in complex daily situations with supervision A SLP Short Term Goal 2 (Week 1): Patient will utilize memory compensatory aids with supervision A SLP Short Term Goal 3 (Week 1): Patient will recall memory compensatory aids with supervision A  Refer to Care Plan for Long Term Goals  Recommendations for other services: None   Discharge Criteria: Patient will be discharged from SLP if patient refuses treatment 3 consecutive times without medical reason, if treatment goals not met, if there is a change in medical status, if patient makes no progress towards goals or if patient is discharged from hospital.  The above assessment, treatment plan, treatment alternatives and goals were discussed and mutually agreed upon: by patient  Kallan Merrick M.A., CF-SLP 11/20/2022, 12:33 PM

## 2022-11-20 NOTE — Evaluation (Signed)
Occupational Therapy Assessment and Plan  Patient Details  Name: Mathew [name] MRN: [id number] Date of Birth: 08/29/1950  OT Diagnosis: acute pain and muscle weakness (generalized) Rehab Potential: Rehab Potential (ACUTE ONLY): Excellent ELOS: 14-18 days   Today's Date: 11/20/2022 OT Individual Time: 4132-4401 OT Individual Time Calculation (min): 84 min     Hospital Problem: Principal Problem:   Debility Active Problems:   Acute thoracic aortic dissection (HCC)   Past Medical History:  Past Medical History:  Diagnosis Date   Hepatitis C    s/p treatment   Hypertension    Obstructive sleep apnea    unable to tolerate CPAP   Premature ventricular contraction    Prostate cancer (HCC) 2006   Past Surgical History:  Past Surgical History:  Procedure Laterality Date   LOOP RECORDER INSERTION N/A 07/04/2016   Procedure: Loop Recorder Insertion;  Surgeon: Hillis Range, MD;  Location: [facility];  Service: Cardiovascular;  Laterality: N/A;   prostectomy     SPLENECTOMY, TOTAL     TEE WITHOUT CARDIOVERSION  10/30/2022   Procedure: TRANSESOPHAGEAL ECHOCARDIOGRAM;  Surgeon: Nada Libman, MD;  Location: [facility] OR;  Service: Vascular;;   THORACIC AORTIC ENDOVASCULAR STENT GRAFT N/A 10/30/2022   Procedure: THORACIC AORTIC ENDOVASCULAR STENT GRAFT with Left Subclavian artery revascularization with laser.;  Surgeon: Nada Libman, MD;  Location: [facility] OR;  Service: Vascular;  Laterality: N/A;    Assessment & Plan Clinical Impression: Patient is a 72 year old right-handed male with history 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  Presented 10/25/2022 with chest pain radiating to the abdomen.  Initially there was a STEMI code on further workup he was noted to have type B aortic dissection extending from the distal aortic arch to  the level of the diaphragm per CT angiogram of the chest.  He was started on nitroglycerin infusion.  Given need for tight hemodynamic control PCCM consulted for further management and admission.  VVS and cardiology consulted.  Initially required Esmolol drip, weaned off with the addition of multiple p.o. medications.  Blood pressures normalized and patient was deemed stable for discharge 10/29/2022 due to no expansion of dissection ultimately returning on  6/26 for worsening pain or imaging confirmed aortic rupture requiring endovascular repair by vascular surgery Dr. Chestine Spore.  Hospital course 11/01/2022 patient with left lower extremity weakness of acute onset.  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  CT angiogram head and neck showed no acute intracranial process no intracranial large vessel occlusion or significant stenosis.  No hemodynamically significant stenosis in the carotid and vertebral arteries.  Patient transferred to CIR on 11/19/2022 .    Patient currently requires  min-mod A  with basic self-care skills secondary to muscle weakness, decreased cardiorespiratoy endurance, decreased problem solving, decreased memory, and delayed processing, and decreased sitting balance, decreased standing balance, and decreased balance strategies.  Prior to hospitalization, patient could complete all ADL tasks with independent .  Patient will benefit from skilled intervention to decrease level of assist with basic self-care skills prior to discharge home with care partner.  Anticipate patient will require intermittent supervision and  Home Health OT or Hospice services (Will continue to assess based on pt's progress) .  OT - End of  Session Activity Tolerance: Decreased this session Endurance Deficit: Yes Endurance Deficit Description: delayed and impaired OT  Assessment Rehab Potential (ACUTE ONLY): Excellent OT Barriers to Discharge: Other (comments) OT Barriers to Discharge Comments: active cancer diagnosis OT Patient demonstrates impairments in the following area(s): Balance;Endurance;Motor;Pain;Safety OT Basic ADL's Functional Problem(s): Grooming;Bathing;Dressing;Toileting OT Transfers Functional Problem(s): Toilet;Tub/Shower OT Plan OT Intensity: Minimum of 1-2 x/day, 45 to 90 minutes OT Frequency: 5 out of 7 days OT Duration/Estimated Length of Stay: 14-18 days OT Treatment/Interventions: Balance/vestibular training;Neuromuscular re-education;Self Care/advanced ADL retraining;Therapeutic Exercise;UE/LE Strength taining/ROM;Wheelchair propulsion/positioning;DME/adaptive equipment instruction;Pain management;UE/LE Coordination activities;Patient/family education;Functional electrical stimulation;Community reintegration;Discharge planning;Functional mobility training;Psychosocial support;Therapeutic Activities OT Basic Self-Care Anticipated Outcome(s): SBA OT Toileting Anticipated Outcome(s): Mod I OT Bathroom Transfers Anticipated Outcome(s): Mod I OT Recommendation Recommendations for Other Services: Neuropsych consult;Therapeutic Recreation consult Therapeutic Recreation Interventions: Outing/community reintergration;Stress management Patient destination: Home Follow Up Recommendations: Home health OT;Other (comment) Equipment Recommended: 3 in 1 bedside comode;Tub/shower seat;To be determined Equipment Details: [facility]   OT Evaluation Precautions/Restrictions  Precautions Precautions: Fall Precaution Comments: General debility; cancer Restrictions Weight Bearing Restrictions: No LLE Weight Bearing: Weight bearing as tolerated Pain Pain Assessment Pain Scale: 0-10 Pain Score: 5  Pain Type: Acute pain Pain Location: Neck Pain Orientation: Posterior Pain Descriptors / Indicators: Throbbing Pain Onset: On-going Pain  Intervention(s): Refused;Repositioned;Distraction;Emotional support;Massage Home Living/Prior Functioning Home Living Family/patient expects to be discharged to:: Private residence Living Arrangements: Spouse/significant other Available Help at Discharge: Family, Available PRN/intermittently Type of Home: House Home Access: Stairs to enter [employer] of Steps: 2-4 Entrance Stairs-Rails: None Home Layout: Two level, Bed/bath upstairs, 1/2 bath on main level Alternate Level Stairs-Number of Steps: flight Alternate Level Stairs-Rails: None Bathroom Shower/Tub: Psychologist, counselling, Sport and exercise psychologist: Standard Bathroom Accessibility: Yes  Lives With: Spouse Prior Function Level of Independence: Independent with transfers, Independent with gait, Independent with basic ADLs  Able to Take Stairs?: Yes Driving: Yes Vocation: Retired Gaffer: Retired Advice worker Baseline Vision/History: 0 No visual deficits Ability to See in Adequate Light: 1 Impaired Patient Visual Report: Other (comment) (sees occassional "block floaters") Vision Assessment?: No apparent visual deficits Perception  Perception: Within Functional Limits Praxis Praxis: Intact Cognition Cognition Overall Cognitive Status: Impaired/Different from baseline Arousal/Alertness: Awake/alert Memory: Impaired Memory Impairment: Decreased short term memory Decreased Short Term Memory: Verbal basic;Functional basic Attention: Sustained;Focused;Selective Focused Attention: Appears intact Sustained Attention: Appears intact Selective Attention: Appears intact Selective Attention Impairment: Verbal complex;Functional complex Awareness: Appears intact Problem Solving: Impaired Problem Solving Impairment: Functional complex;Verbal complex Organizing: Appears intact Safety/Judgment: Appears intact Comments: Will continue to assess in functional context Brief Interview for Mental Status  (BIMS) Repetition of Three Words (First Attempt): 3 Temporal Orientation: Year: Correct Temporal Orientation: Month: Accurate within 5 days BIMS Summary Score: 99 Sensation Sensation Light Touch: Appears Intact Hot/Cold: Appears Intact Proprioception: Appears Intact Coordination Coordination and Movement Description: Generalized weakness and deconditioning Motor  Motor Motor: Hemiplegia;Abnormal postural alignment and control Motor - Skilled Clinical Observations: mild L hemi; debility  Trunk/Postural Assessment  Cervical Assessment Cervical Assessment: Exceptions to [facility] (forward head) Thoracic Assessment Thoracic Assessment: Exceptions to [facility] (rounded shoulders) Lumbar Assessment Lumbar Assessment: Exceptions to [facility] (posterior pelvic tilt) Postural Control Postural Control: Deficits on evaluation Trunk Control: posterior bias - mild  Balance Balance Balance Assessed: Yes Static Sitting Balance Static Sitting - Balance Support: Feet supported;No upper extremity supported Static Sitting - Level of Assistance: 5: Stand by assistance Dynamic Sitting Balance Dynamic Sitting - Balance Support: Feet supported;No upper extremity supported Dynamic  Sitting - Level of Assistance: 4: Min assist Sitting balance - Comments: sitting EOB Static Standing Balance Static Standing - Balance Support: Bilateral upper extremity supported;During functional activity Static Standing - Level of Assistance: 4: Min assist Dynamic Standing Balance Dynamic Standing - Balance Support: Bilateral upper extremity supported;During functional activity Dynamic Standing - Level of Assistance: 4: Min assist Extremity/Trunk Assessment RUE Assessment RUE Assessment: Exceptions to [facility] Active Range of Motion (AROM) Comments: WFL in all ranges and joints General Strength Comments: . MMT: 4/5 overall shoulder, elbow. Decreased gross grasp. LUE Assessment LUE Assessment: Exceptions to [facility] Active Range of Motion  (AROM) Comments: WFL in all ranges. General Strength Comments: . MMT: 4/5 overall shoulder, elbow. Decreased gross grasp.  Care Tool Care Tool Self Care Eating   Eating Assist Level: Independent with assistive device    Oral Care    Oral Care Assist Level: Set up assist    Bathing   Body parts bathed by patient: Right arm;Abdomen;Chest;Left arm;Front perineal area;Buttocks;Right upper leg;Left upper leg;Face Body parts bathed by helper: Left lower leg;Right lower leg   Assist Level: Minimal Assistance - Patient > 75%    Upper Body Dressing(including orthotics)   What is the patient wearing?: Hospital gown only   Assist Level: Minimal Assistance - Patient > 75%    Lower Body Dressing (excluding footwear)   What is the patient wearing?: Incontinence brief Assist for lower body dressing: Total Assistance - Patient < 25%    Putting on/Taking off footwear   What is the patient wearing?: Shoes;Socks Assist for footwear: Minimal Assistance - Patient > 75%       Care Tool Toileting Toileting activity   Assist for toileting: Minimal Assistance - Patient > 75%     Care Tool Bed Mobility    Sit to lying activity   Sit to lying assist level: Minimal Assistance - Patient > 75%        Care Tool Transfers Sit to stand transfer   Sit to stand assist level: Moderate Assistance - Patient 50 - 74%    Chair/bed transfer   Chair/bed transfer assist level: Minimal Assistance - Patient > 75%     Toilet transfer   Assist Level: Minimal Assistance - Patient > 75%     Care Tool Cognition  Expression of Ideas and Wants Expression of Ideas and Wants: 3. Some difficulty - exhibits some difficulty with expressing needs and ideas (e.g, some words or finishing thoughts) or speech is not clear  Understanding Verbal and Non-Verbal Content Understanding Verbal and Non-Verbal Content: 3. Usually understands - understands most conversations, but misses some part/intent of message. Requires cues  at times to understand   Memory/Recall Ability Memory/Recall Ability : Staff names and faces;That he or she is in a hospital/hospital unit   Refer to Care Plan for Long Term Goals  SHORT TERM GOAL WEEK 1 OT Short Term Goal 1 (Week 1): Patient will complete sit to stand from standard surface level with Min guard assist OT Short Term Goal 2 (Week 1): Pt will complete LB dressing with Min A utilizing AE as needed. OT Short Term Goal 3 (Week 1): Pt will completed toileting with Min A  Recommendations for other services: Neuropsych and Therapeutic Recreation  Stress management and Outing/community reintegration   Skilled Therapeutic Intervention ADL ADL Eating: Modified independent Where Assessed-Eating: Chair Grooming: Setup Where Assessed-Grooming: Sitting at sink Upper Body Bathing: Setup Where Assessed-Upper Body Bathing: Shower Lower Body Bathing: Minimal assistance Where Assessed-Lower Body Bathing: Shower  Upper Body Dressing: Minimal assistance Where Assessed-Upper Body Dressing: Edge of bed Lower Body Dressing: Maximal assistance Where Assessed-Lower Body Dressing: Edge of bed Toileting: Moderate assistance Where Assessed-Toileting: Teacher, adult education: Curator Method: Proofreader: Grab bars;Bedside commode Tub/Shower Transfer: Not assessed Walk-In Shower Transfer: Minimal assistance;Minimal cueing Film/video editor Method: Designer, industrial/product: Grab bars;Transfer tub bench Mobility  Bed Mobility Bed Mobility: Sit to Supine Sit to Supine: Minimal Assistance - Patient > 75% Transfers Sit to Stand: Moderate Assistance - Patient 50-74% (from low surface (recliner)) Stand to Sit: Minimal Assistance - Patient > 75% Skilled Interventions Pt sitting up in recliner upon therapy arrival with brother, Weyman Croon visiting in room. Pt complete bathing at shower level while seated utilizing tub bench. Tub bench  extended to highest height to accommodate for p being  6'3". RW raised one notice to be at appropriate height. VC provided during functional mobility while using RW on appropriate distance to keep RW while walking in room and bathroom. VC provided for appropriate hand placement while managing RW during sit<>stand transitions. Pt participated in goal setting for OT. Education provided on therapy schedule. Assessed posterior neck pain. Pt presents with moderate fascial restrictions along SCL and levator scapulae L>R. Completed myofascial release to bilateral upper trapezius and cervical region to address fascial restrictions and decrease pain level. K pad applied at end of session with pt supine in bed.    Discharge Criteria: Patient will be discharged from OT if patient refuses treatment 3 consecutive times without medical reason, if treatment goals not met, if there is a change in medical status, if patient makes no progress towards goals or if patient is discharged from hospital.  The above assessment, treatment plan, treatment alternatives and goals were discussed and mutually agreed upon: by patient  Limmie Patricia, OTR/L,CBIS  Supplemental OT - [facility] Secure Chat Preferred   11/20/2022, 4:34 PM

## 2022-11-20 NOTE — Patient Care Conference (Signed)
Inpatient RehabilitationTeam Conference and Plan of Care Update Date: 11/20/2022   Time: 11:18 AM    Patient Name: Mathew [name]      Medical Record Number: [id number]  Date of Birth: [birth year] Sex: Male         Room/Bed: 4W20C/4W20C-01 Payor Info: Payor: BLUE CROSS BLUE SHIELD / Plan: BCBS COMM PPO / Product Type: *No Product type* /    Admit Date/Time:  11/19/2022  2:55 PM  Primary Diagnosis:  Debility  Hospital Problems: Principal Problem:   Debility Active Problems:   Acute thoracic aortic dissection [facility] [address])    Expected Discharge Date: Expected Discharge Date: 12/02/22  Team Members Present: Physician leading conference: Dr. Sula Soda Social Worker Present: Dossie Der, LCSW Nurse Present: Chana Bode, RN PT Present: Wynelle Link, PT OT Present: Roney Mans, OT SLP Present: Other (comment) Abbe Amsterdam, SLP) PPS Coordinator present : Fae Pippin, SLP     Current Status/Progress Goal Weekly Team Focus  Bowel/Bladder   Continent of bowel, Periods of incontinence of bladder: LBM 11/18/22   Remain continent of bowel, Regain continence of bladder.   Offer toileting q2 hours while awake and PRN.    Swallow/Nutrition/ Hydration   Eval Pending           ADL's   evals pending            Mobility               Communication   Eval Pending            Safety/Cognition/ Behavioral Observations  Eval Pending            Pain   Patient denies pain at this time.   Patient rates pain < or equal to 2 out of 10.   Assess pain q shift and PRN    Skin   Left AC wound-yellow,open-foam dressing, B/L buttocks-red, Left and Right groin site intact.   Prevent skin from breakdown and infection.  Assess skin q shift and PRN.      Discharge Planning:  New evaluation-home with wife and may need to hire assist. Will await team's evaluations today   Team Discussion: Patient post aortic dissection with history of HCC; presenting with  deconditioning, hyponatremia, malnutrition and fatigue. Requesting medication treatment for excessive salivation, nausea and appetite stimulant.  Patient on target to meet rehab goals: yes, currently needs min assist overall for sit- stand, stand pivot, ambulating up to 75' and managing ascending and descending steps.  Goals for discharge set for supervision overall.  *See Care Plan and progress notes for long and short-term goals.   Revisions to Treatment Plan:  N/a   Teaching Needs: Safety, medications, dietary modification, transfers, etc.   Current Barriers to Discharge: Decreased caregiver support and Home enviroment access/layout  Possible Resolutions to Barriers: Family education HH follow up services     Medical Summary Current Status: HTN, tachycardia, hyponatremia, hypoalbuminemia, fatigue, cellulitis, leukocytosis  Barriers to Discharge: Medical stability  Barriers to Discharge Comments: HTN, tachycardia, hyponatremia, hypoalbuminemia, fatigue, cellulitis, leukocytosis Possible Resolutions to [employer] Focus: continue to monitor vitals TID, repeat Na tomororw, educated regarind levels, encougared high protein diet, start mirtzapine at night, start modafinil tomorrow, start Vibra for cellulitis   Continued Need for Acute Rehabilitation Level of Care: The patient requires daily medical management by a physician with specialized training in physical medicine and rehabilitation for the following reasons: Direction of a multidisciplinary physical rehabilitation program to maximize functional independence : Yes Medical management of patient  stability for increased activity during participation in an intensive rehabilitation regime.: Yes Analysis of laboratory values and/or radiology reports with any subsequent need for medication adjustment and/or medical intervention. : Yes   I attest that I was present, lead the team conference, and concur with the assessment and plan  of the team.   Chana Bode B 11/20/2022, 2:37 PM

## 2022-11-20 NOTE — Progress Notes (Signed)
PROGRESS NOTE   Subjective/Complaints: Patient complains of neck and back pain, kpad ordered Feels tired with therapy this morning Says he would like to be DNR, will ask palliative to discuss goals of care again  ROS: +neck and back pain  Objective:   No results found. Recent Labs    11/19/22 1535 11/20/22 0607  WBC 14.8* 16.0*  HGB 13.7 12.1*  HCT 42.1 37.0*  PLT 359 410*   Recent Labs    11/19/22 0326 11/19/22 1535 11/20/22 0607  NA 128*  --  126*  K 4.3  --  4.4  CL 102  --  101  CO2 19*  --  19*  GLUCOSE 99  --  94  BUN 19  --  16  CREATININE 1.17 1.21 1.07  CALCIUM 8.0*  --  7.7*    Intake/Output Summary (Last 24 hours) at 11/20/2022 0947 Last data filed at 11/20/2022 0634 Gross per 24 hour  Intake 240 ml  Output 550 ml  Net -310 ml        Physical Exam: Vital Signs Blood pressure (!) 147/85, pulse (!) 102, temperature 98.2 F (36.8 C), temperature source Oral, resp. rate 18, height 6\' 3"  (1.905 m), weight 90.7 kg, SpO2 93%. Gen: no distress, normal appearing HEENT: oral mucosa pink and moist, NCAT Cardio: Tachycardia Chest: normal effort, normal rate of breathing Abd: soft, non-distended Genitourinary:    Comments: Wearing male purewick- urine on darker side/almost medium beer color Musculoskeletal:     Cervical back: Neck supple.     Comments: RUE- 5/5 in biceps,t riceps, grip and FA LUE- slightly weaker- 5-/5 in same muscles RLE- HF 4/5; KE/KF 4+/5; DF/PF 5-/5 LLE-  HF 3-/5; KE 4/5; DF/PF 4+/5  Skin:    General: Skin is warm and dry.     Comments: Infiltrated IV that's been removed in L AC fossa- large pore opening appearance- maybe ~ 3-4 mm opening- with associate slough- and appears to be slightly warm and maybe cellulitis R forearm IV looks OK  Neurological:     Comments: Patient is alert however appears fatigued.  Oriented x 3 and follows commands Intact to light touch in all  4  extremities   Psychiatric:     Comments: Very flat affect- slightly delayed responses    Assessment/Plan: 1. Functional deficits which require 3+ hours per day of interdisciplinary therapy in a comprehensive inpatient rehab setting. Physiatrist is providing close team supervision and 24 hour management of active medical problems listed below. Physiatrist and rehab team continue to assess barriers to discharge/monitor patient progress toward functional and medical goals  Care Tool:  Bathing              Bathing assist       Upper Body Dressing/Undressing Upper body dressing        Upper body assist      Lower Body Dressing/Undressing Lower body dressing            Lower body assist       Toileting Toileting    Toileting assist       Transfers Chair/bed transfer  Transfers assist     Chair/bed transfer assist  level: Minimal Assistance - Patient > 75%     Locomotion Ambulation   Ambulation assist      Assist level: Minimal Assistance - Patient > 75% Assistive device: Walker-rolling Max distance: 51ft   Walk 10 feet activity   Assist     Assist level: Minimal Assistance - Patient > 75% Assistive device: Walker-rolling   Walk 50 feet activity   Assist    Assist level: Minimal Assistance - Patient > 75% Assistive device: Walker-rolling    Walk 150 feet activity   Assist Walk 150 feet activity did not occur: Safety/medical concerns (fatigue)         Walk 10 feet on uneven surface  activity   Assist Walk 10 feet on uneven surfaces activity did not occur: Safety/medical concerns         Wheelchair     Assist Is the patient using a wheelchair?: Yes Type of Wheelchair: Manual    Wheelchair assist level: Dependent - Patient 0%      Wheelchair 50 feet with 2 turns activity    Assist        Assist Level: Dependent - Patient 0%   Wheelchair 150 feet activity     Assist      Assist Level: Dependent  - Patient 0%   Blood pressure (!) 147/85, pulse (!) 102, temperature 98.2 F (36.8 C), temperature source Oral, resp. rate 18, height 6\' 3"  (1.905 m), weight 90.7 kg, SpO2 93%.  Medical Problem List and Plan: 1. 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  Antithrombotics: -DVT/anticoagulation:  Pharmaceutical: Heparin             -antiplatelet therapy: Aspirin 81 mg daily 3. Neck and back pain: kpad ordered  4. Mood/Behavior/Sleep: Provide emotional support             -antipsychotic agents: N/A 5. Neuropsych/cognition: This patient is capable of making decisions on his own behalf. 6. Skin/Wound Care: Routine skin checks 7. Fluids/Electrolytes/Nutrition: Routine in and outs with follow-up chemistries 8.  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  Follow-up outpatient Dr. Mosetta Putt.  Currently not a candidate for more cancer treatment 9.  History of prostate cancer with prostatectomy.  Follow-up outpatient- pt has had leakage for 20 years since surgery done- wears briefs 10.  AKI.  Follow-up chemistries. 11.  Hypertension.  Norvasc 10 mg daily.,  Toprol-XL 12.5 mg daily 12.  Hyperlipidemia.  Lipitor 13.  History of tobacco use.  Quit smoking 3 months ago.  Provide counseling 14. DNR- made DNR and plans for outpt with hospice when discharged from CIR. Discussed with patient and he expressed wishes to remain DNR, there was some conflicting information in prior palliative care note, so have consulted palliative care to redicuss goals of care with patient  15. Chronic nausea- on oral meds- helpful, but not resolved 16. Cough- notes better with new cough meds. Will monitor for any worsening of Sx's.  17.  Hyponatremia: messaged SLP to see if we can advance him to a regular diet, repeat BMP tomorrow  18. Decreased appetite: discussed starting mirtazepine 7.5mg  HS  19. Insomnia: discussed starting mirtazepine 7.5mg  HS  20. Leukocytosis:  temperature reviewed and afebrile, repeat tomorrow to trend  >50 minutes spent in discussion of low sodium, how it could be related to his decreased appetite/intake, messaged SLP regarding whether he can advance to regular diet, discussed starting mirtazepine 7.5mg  HS for decreased appetite and insomnia, kpad ordered for neck and back pain, repeat WBC tomorrow, discussed code status and he wishes to be DNR, reconsulted palliative care for further goals of care discussion    LOS: 1 days A FACE TO FACE EVALUATION WAS PERFORMED  Mathew [name] 11/20/2022, 9:47 AM

## 2022-11-20 NOTE — Progress Notes (Signed)
Upon re-assessing patient's pain. Patient rates pain 0/10. Patient denies chest pain. Patient not showing any signs of pain or discomfort. Breathing is non-labored.

## 2022-11-20 NOTE — Plan of Care (Signed)
  Problem: RH Problem Solving Goal: LTG Patient will demonstrate problem solving for (SLP) Description: LTG:  Patient will demonstrate problem solving for basic/complex daily situations with cues  (SLP) Flowsheets (Taken 11/20/2022 1231) LTG: Patient will demonstrate problem solving for (SLP): Complex daily situations LTG Patient will demonstrate problem solving for: Modified Independent   Problem: RH Memory Goal: LTG Patient will use memory compensatory aids to (SLP) Description: LTG:  Patient will use memory compensatory aids to recall biographical/new, daily complex information with cues (SLP) Flowsheets (Taken 11/20/2022 1231) LTG: Patient will use memory compensatory aids to (SLP): Modified Independent

## 2022-11-20 NOTE — Progress Notes (Signed)
Initial Nutrition Assessment  DOCUMENTATION CODES:   Non-severe (moderate) malnutrition in context of chronic illness  INTERVENTION:  - Continue Boost Breeze po TID, each supplement provides 250 kcal and 9 grams of protein  - Add MVI q day.   NUTRITION DIAGNOSIS:   Moderate Malnutrition related to chronic illness as evidenced by moderate fat depletion, moderate muscle depletion.  GOAL:   Patient will meet greater than or equal to 90% of their needs  MONITOR:   PO intake, Supplement acceptance  REASON FOR ASSESSMENT:   Malnutrition Screening Tool    ASSESSMENT:   72 y.o. 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 PMH includes: Hep C, HTN, OSA, prostate cancer.  Meds reviewed: lipitor, TUMS, colace, reglan, remeron, miralax, sodium chloride. Labs reviewed: Na low.   Pt reports that his appetite has been fair and he also states that he is having some difficulty chewing and swallowing certain foods still. SLP is following. Per record, pt's intakes have varied since admission to rehab, ranging from 5-100%. Pt also has Boost Breeze ordered TID which he states that he has been drinking. He states that he does not like the regular boost or ensure shakes. RD will continue to monitor PO intakes. No significant wt loss.   NUTRITION - FOCUSED PHYSICAL EXAM:  Flowsheet Row Most Recent Value  Orbital Region Moderate depletion  Upper Arm Region Moderate depletion  Thoracic and Lumbar Region Moderate depletion  Buccal Region Moderate depletion  Temple Region Moderate depletion  Clavicle Bone Region Moderate depletion  Clavicle and Acromion Bone Region Moderate depletion  Scapular Bone Region Moderate depletion  Dorsal Hand Moderate depletion  Patellar Region Mild depletion  Anterior Thigh Region Mild  depletion  Posterior Calf Region Mild depletion  Edema (RD Assessment) None  Hair Reviewed  Eyes Reviewed  Mouth Reviewed  Skin Reviewed  Nails Reviewed       Diet Order:   Diet Order             Diet regular Room service appropriate? Yes; Fluid consistency: Thin  Diet effective now                   EDUCATION NEEDS:   Not appropriate for education at this time  Skin:  Skin Assessment: Reviewed RN Assessment  Last BM:  7/15  Height:   Ht Readings from Last 1 Encounters:  11/19/22 6\' 3"  (1.905 m)    Weight:   Wt Readings from Last 1 Encounters:  11/19/22 90.7 kg    Ideal Body Weight:     BMI:  Body mass index is 24.99 kg/m.  Estimated Nutritional Needs:   Kcal:  2200-2500 kcals  Protein:  125-150 gm  Fluid:  >/= 2 L  Bethann Humble, RD, LDN, CNSC.

## 2022-11-20 NOTE — Progress Notes (Addendum)
[facility] Individual Statement of Services  Patient Name:  Mathew [name]  Date:  11/20/2022  Welcome to the [facility].  Our goal is to provide you with an individualized program based on your diagnosis and situation, designed to meet your specific needs.  With this comprehensive rehabilitation program, you will be expected to participate in at least 3 hours of rehabilitation therapies Monday-Friday, with modified therapy programming on the weekends.  Your rehabilitation program will include the following services:  Physical Therapy (PT), Occupational Therapy (OT), Speech Therapy (ST), 24 hour per day rehabilitation nursing, Therapeutic Recreaction (TR), Neuropsychology, Care Coordinator, Rehabilitation Medicine, Nutrition Services, and Pharmacy Services  Weekly team conferences will be held on Wednesday to discuss your progress.  Your Inpatient Rehabilitation Care Coordinator will talk with you frequently to get your input and to update you on team discussions.  Team conferences with you and your family in attendance may also be held.  Expected length of stay: 10-12 days  Overall anticipated outcome: supervision level  Depending on your progress and recovery, your program may change. Your Inpatient Rehabilitation Care Coordinator will coordinate services and will keep you informed of any changes. Your Inpatient Rehabilitation Care Coordinator's name and contact numbers are listed  below.  The following services may also be recommended but are not provided by the [facility]:  Driving Evaluations Home Health Rehabiltiation Services Outpatient Rehabilitation Services    Arrangements will be made to provide these services after discharge if needed.  Arrangements include referral to agencies that provide these services.  Your insurance has been verified to be:  BCBS and medicare part A Your primary doctor is:  Benedetto Goad  Pertinent  information will be shared with your doctor and your insurance company.  Inpatient Rehabilitation Care Coordinator:  Dossie Der, [place] [phone] or Luna Glasgow  Information discussed with and copy given to patient by: [name], 11/20/2022, 2:33 PM

## 2022-11-21 DIAGNOSIS — C3490 Malignant neoplasm of unspecified part of unspecified bronchus or lung: Secondary | ICD-10-CM

## 2022-11-21 DIAGNOSIS — C228 Malignant neoplasm of liver, primary, unspecified as to type: Secondary | ICD-10-CM

## 2022-11-21 DIAGNOSIS — R110 Nausea: Secondary | ICD-10-CM

## 2022-11-21 DIAGNOSIS — R053 Chronic cough: Secondary | ICD-10-CM

## 2022-11-21 DIAGNOSIS — I6930 Unspecified sequelae of cerebral infarction: Secondary | ICD-10-CM

## 2022-11-21 DIAGNOSIS — G4700 Insomnia, unspecified: Secondary | ICD-10-CM

## 2022-11-21 LAB — BASIC METABOLIC PANEL
Anion gap: 6 (ref 5–15)
BUN: 17 mg/dL (ref 8–23)
CO2: 20 mmol/L — ABNORMAL LOW (ref 22–32)
Calcium: 7.9 mg/dL — ABNORMAL LOW (ref 8.9–10.3)
Chloride: 99 mmol/L (ref 98–111)
Creatinine, Ser: 1.23 mg/dL (ref 0.61–1.24)
GFR, Estimated: >60 mL/min (ref >60)
Glucose, Bld: 81 mg/dL (ref 70–99)
Potassium: 3.9 mmol/L (ref 3.5–5.1)
Sodium: 125 mmol/L — ABNORMAL LOW (ref 135–145)

## 2022-11-21 LAB — CBC WITH DIFFERENTIAL/PLATELET
Abs Immature Granulocytes: 0 10*3/uL (ref 0.00–0.07)
Basophils Absolute: 0.3 10*3/uL — ABNORMAL HIGH (ref 0.0–0.1)
Basophils Relative: 2 %
Eosinophils Absolute: 0.7 10*3/uL — ABNORMAL HIGH (ref 0.0–0.5)
Eosinophils Relative: 5 %
HCT: 39.6 % (ref 39.0–52.0)
Hemoglobin: 12.8 g/dL — ABNORMAL LOW (ref 13.0–17.0)
Lymphocytes Relative: 8 %
Lymphs Abs: 1.1 10*3/uL (ref 0.7–4.0)
MCH: 25.4 pg — ABNORMAL LOW (ref 26.0–34.0)
MCHC: 32.3 g/dL (ref 30.0–36.0)
MCV: 78.7 fL — ABNORMAL LOW (ref 80.0–100.0)
Monocytes Absolute: 1.4 10*3/uL — ABNORMAL HIGH (ref 0.1–1.0)
Monocytes Relative: 10 %
Neutro Abs: 10.7 10*3/uL — ABNORMAL HIGH (ref 1.7–7.7)
Neutrophils Relative %: 75 %
Platelets: 434 10*3/uL — ABNORMAL HIGH (ref 150–400)
RBC: 5.03 MIL/uL (ref 4.22–5.81)
RDW: 20.6 % — ABNORMAL HIGH (ref 11.5–15.5)
WBC: 14.3 10*3/uL — ABNORMAL HIGH (ref 4.0–10.5)
nRBC: 0 /100 WBC
nRBC: 0.3 % — ABNORMAL HIGH (ref 0.0–0.2)

## 2022-11-21 LAB — GLUCOSE, CAPILLARY: Glucose-Capillary: 100 mg/dL — ABNORMAL HIGH (ref 70–99)

## 2022-11-21 MED ORDER — SODIUM CHLORIDE 1 G PO TABS
1.0000 g | ORAL_TABLET | Freq: Two times a day (BID) | ORAL | Status: DC
  Administered 2022-11-21 – 2022-11-28 (×14): 1 g via ORAL
  Filled 2022-11-21 (×15): qty 1

## 2022-11-21 MED ORDER — CALCIUM CARBONATE ANTACID 500 MG PO CHEW
1.0000 | CHEWABLE_TABLET | Freq: Every day | ORAL | Status: DC
  Administered 2022-11-21 – 2022-11-28 (×8): 200 mg via ORAL
  Filled 2022-11-21 (×8): qty 1

## 2022-11-21 NOTE — Progress Notes (Signed)
Physical Therapy Session Note  Patient Details  Name: Mathew [name] MRN: [id number] Date of Birth: [birth year]  Today's Date: 11/21/2022 PT Individual Time: 0200-0304 PT Individual Time Calculation (min): 64 min   Short Term Goals: Week 1:  PT Short Term Goal 1 (Week 1): Pt will complete bed mobility with CGA and no hospital bed features PT Short Term Goal 2 (Week 1): Pt will complete bed<>chair transfers with CGA and LRAD PT Short Term Goal 3 (Week 1): Pt will ambulate 189ft with CGA and LRAD PT Short Term Goal 4 (Week 1): Pt will navigate up/down x4 stairs with CGA and LRAD  Skilled Therapeutic Interventions/Progress Updates:   Pt received reclining in recliner chair. No complaints of pain. Pt agreeable to tx.   Theract: STS from recliner chair with PT light CGA to SBA; education performed on bed mobility due to pt having a regular bed at home but relying on bed rails and headboard in room. Pt states the head of his bed goes up/down only. Sit to supine min A for L LE placement initially and supine to sit min A; with education, pt able to perform bed mobility with SBA.  Therex: bridge x 20 with concentration on control; SLR x 20 with difficulty L LE but with concentration on quad contraction; supine hip abduction/adduction x 20. All performed bil.  Gait training 2x82 ft with RW light CGA and verbal cues for improving L foot clearance and ambulating closer to RW.  After tx, pt states he can feel his legs and hips had been worked but no pain. Pt left supine in bed, bed alarm on, all needs within reach. Reports of fatigue but no pain.     Therapy Documentation Precautions:  Precautions Precautions: Fall Precaution Comments: General debility; cancer Restrictions Weight Bearing Restrictions: No LLE Weight Bearing: Weight bearing as tolerated     Therapy/Group: Individual Therapy  Luna Fuse 11/21/2022, 12:48 PM

## 2022-11-21 NOTE — Progress Notes (Signed)
Speech Language Pathology Daily Session Note  Patient Details  Name: Mathew [name] MRN: [id number] Date of Birth: 05/14/1950  Today's Date: 11/21/2022 SLP Individual Time: 1002-1102 SLP Individual Time Calculation (min): 60 min  Short Term Goals: Week 1: SLP Short Term Goal 1 (Week 1): Patient will demonstrate problem solving abilities in complex daily situations with supervision A SLP Short Term Goal 2 (Week 1): Patient will utilize memory compensatory aids with supervision A SLP Short Term Goal 3 (Week 1): Patient will recall memory compensatory aids with supervision A  Skilled Therapeutic Interventions: Skilled therapy session focused on cognitive re-training. SLP facilitated session by providing education regarding memory strategies and their importance. SLP also recommended use of pill box for medication management upon d/c to aid in organization and memory. Patient and SLP reviewed current medications and patient completed bill box with supervision A to address problem solving goals. Patient verbalized understanding of importance of utilizing pill box for organization. Patient noted he was very fatigued this date due to medications. Patient left in chair with alarm set and call bell in reach. Continue POC.   Addendum: OT reports patient noted difficulty swallowing different textures. SLP spoke with patient about concern and he reports feeling discomfort when foods are too dry. SLP recommended utilizing condiments when consuming dry textures.   Pain Pain Assessment Pain Scale: 0-10 Pain Score: 0-No pain  Therapy/Group: Individual Therapy [name] M.A., CF-SLP 11/21/2022, 12:50 PM

## 2022-11-21 NOTE — Progress Notes (Signed)
Occupational Therapy Session Note  Patient Details  Name: Mathew [name] MRN: [id number] Date of Birth: 11/30/1950  Today's Date: 11/21/2022 OT Individual Time: 4098-1191 OT Individual Time Calculation (min): 57 min    Short Term Goals: Week 1:  OT Short Term Goal 1 (Week 1): Patient will complete sit to stand from standard surface level with Min guard assist OT Short Term Goal 2 (Week 1): Pt will complete LB dressing with Min A utilizing AE as needed. OT Short Term Goal 3 (Week 1): Pt will completed toileting with Min A  Skilled Therapeutic Interventions/Progress Updates:     Pt received reclined in bed presenting to be in good spirits receptive to skilled OT session reporting unrated chronic pain in upper back/neck- OT offering intermittent rest breaks, repositioning, and therapeutic support to optimize participation in therapy session. Focus this session BADL retraining, functional mobility training, pain management, and activity tolerance.  Pt transitioned to EOB with supervision using bed features with increased amount of time provided. Pt required mod verbal cues for motor planning and techniques during bed mobility. Sitting EOB, OT guided Pt through gentle neck stretches to decrease neck pain with mild improvement. OT performed myofascial release of posterior neck/bilateral trapezius to decrease pain, promote increased ROM, and support participation in therapy session.   Education provided on hemi-dressing techniques with Pt receptive and verbalizing understanding. Pt able to donn clean shirt EOB with CGA for sitting balance. Pt required assistance to weave LLE into pants with Pt then able to weave RLE. Donned Pt's socks and shoes total A for time management. When transitioning from sit>stand to bring pants to waist, Pt requiring multiple attempts and mod A to rise into stand d/t first stand of the day. Pt able to maintain balance with BUE support on RW while OT brought Pt's pants to waist.    Education provided on energy conservation techniques and fall prevention with Pt receptive to education. Pt chose to complete grooming/hygiene tasks seated at sink for energy conservation. Stand step EOB>wc using RW min A for balance and to power up to stand. Pt able to brush teeth and wash face with supervision.   Pt urgently requesting to use restroom. Utilized urinal d/t urgency to prevent accident. Pt with continent void, documented in flowsheets.   Pt presenting fatigued following morning BADLs with seated rest break provided. Pt receptive to completing functional mobility for endurance and functional mobility training with emphasis on trunk positioning and body awareness. Pt able to complete ~25 ft functional mobility with mod verbal and tactile cues for trunk positioning and alignment with Pt presenting with heavy reliance on BUEs.   Pt was left resting in wc with call bell in reach, seat belt alarm on, and all needs met.    Therapy Documentation Precautions:  Precautions Precautions: Fall Precaution Comments: General debility; cancer Restrictions Weight Bearing Restrictions: No LLE Weight Bearing: Weight bearing as tolerated  Therapy/Group: Individual Therapy  Army Fossa 11/21/2022, 7:55 AM

## 2022-11-21 NOTE — Progress Notes (Signed)
Physical Therapy Session Note  Patient Details  Name: Mathew [name] MRN: [id number] Date of Birth: [birth year]  Today's Date: 11/21/2022 PT Individual Time: 1500-1530 PT Individual Time Calculation (min): 30 min   Short Term Goals: Week 1:  PT Short Term Goal 1 (Week 1): Pt will complete bed mobility with CGA and no hospital bed features PT Short Term Goal 2 (Week 1): Pt will complete bed<>chair transfers with CGA and LRAD PT Short Term Goal 3 (Week 1): Pt will ambulate 160ft with CGA and LRAD PT Short Term Goal 4 (Week 1): Pt will navigate up/down x4 stairs with CGA and LRAD  Skilled Therapeutic Interventions/Progress Updates:    pt received in bed and agreeable to therapy. No complaint of pain.   Pt reports previous therapist recommended supine<>sit practice. Pt able to perform both motions with supervision, Sup>sit using momentum in one fluid motion.   Pt performed the following exercises to promote LE strength and endurance:  LE lifts to mimic in/out of bed Supine marches with active assist for LLE BKFO for inner thigh strength.   Pt remained in bed at end of session, was left with all needs in reach and alarm active.   Therapy Documentation Precautions:  Precautions Precautions: Fall Precaution Comments: General debility; cancer Restrictions Weight Bearing Restrictions: No LLE Weight Bearing: Weight bearing as tolerated General:         Therapy/Group: Individual Therapy  Juluis Rainier 11/21/2022, 3:13 PM

## 2022-11-21 NOTE — Progress Notes (Signed)
PROGRESS NOTE   Subjective/Complaints: Patient and wife have no new complaints this morning Patient does feel sleepy this morning and discussed this could be secondary to mirtazepine  ROS: +neck and back pain, slept better last night  Objective:   No results found. Recent Labs    11/20/22 0607 11/21/22 0701  WBC 16.0* 14.3*  HGB 12.1* 12.8*  HCT 37.0* 39.6  PLT 410* 434*   Recent Labs    11/20/22 0607 11/21/22 0701  NA 126* 125*  K 4.4 3.9  CL 101 99  CO2 19* 20*  GLUCOSE 94 81  BUN 16 17  CREATININE 1.07 1.23  CALCIUM 7.7* 7.9*    Intake/Output Summary (Last 24 hours) at 11/21/2022 1040 Last data filed at 11/21/2022 9147 Gross per 24 hour  Intake 417 ml  Output 175 ml  Net 242 ml        Physical Exam: Vital Signs Blood pressure 129/81, pulse 97, temperature 98.3 F (36.8 C), temperature source Oral, resp. rate 16, height 6\' 3"  (1.905 m), weight 90.7 kg, SpO2 96%. Gen: no distress, normal appearing HEENT: oral mucosa pink and moist, NCAT Cardio: Tachycardia Chest: normal effort, normal rate of breathing Abd: soft, non-distended Genitourinary:    Comments: Wearing male purewick- urine on darker side/almost medium beer color Musculoskeletal:     Cervical back: Neck supple.     Comments: RUE- 5/5 in biceps,t riceps, grip and FA LUE- slightly weaker- 5-/5 in same muscles RLE- HF 4/5; KE/KF 4+/5; DF/PF 5-/5 LLE-  HF 3-/5; KE 4/5; DF/PF 4+/5  Cervical myofascial tightness Skin:    General: Skin is warm and dry.     Comments: Infiltrated IV that's been removed in L AC fossa- large pore opening appearance- maybe ~ 3-4 mm opening- with associate slough- and appears to be slightly warm and maybe cellulitis R forearm IV looks OK  Neurological:     Comments: Patient is alert however appears fatigued.  Oriented x 3 and follows commands Intact to light touch in all  4 extremities   Psychiatric:      Comments: Very flat affect- slightly delayed responses    Assessment/Plan: 1. Functional deficits which require 3+ hours per day of interdisciplinary therapy in a comprehensive inpatient rehab setting. Physiatrist is providing close team supervision and 24 hour management of active medical problems listed below. Physiatrist and rehab team continue to assess barriers to discharge/monitor patient progress toward functional and medical goals  Care Tool:  Bathing    Body parts bathed by patient: Right arm, Abdomen, Chest, Left arm, Front perineal area, Buttocks, Right upper leg, Left upper leg, Face   Body parts bathed by helper: Left lower leg, Right lower leg     Bathing assist Assist Level: Minimal Assistance - Patient > 75%     Upper Body Dressing/Undressing Upper body dressing   What is the patient wearing?: Hospital gown only    Upper body assist Assist Level: Minimal Assistance - Patient > 75%    Lower Body Dressing/Undressing Lower body dressing      What is the patient wearing?: Incontinence brief     Lower body assist Assist for lower body dressing: Total Assistance -  Patient < 25%     Editor, commissioning assist Assist for toileting: Minimal Assistance - Patient > 75%     Transfers Chair/bed transfer  Transfers assist     Chair/bed transfer assist level: Minimal Assistance - Patient > 75%     Locomotion Ambulation   Ambulation assist      Assist level: Minimal Assistance - Patient > 75% Assistive device: Walker-rolling Max distance: 30ft   Walk 10 feet activity   Assist     Assist level: Minimal Assistance - Patient > 75% Assistive device: Walker-rolling   Walk 50 feet activity   Assist    Assist level: Minimal Assistance - Patient > 75% Assistive device: Walker-rolling    Walk 150 feet activity   Assist Walk 150 feet activity did not occur: Safety/medical concerns (fatigue)         Walk 10 feet on uneven  surface  activity   Assist Walk 10 feet on uneven surfaces activity did not occur: Safety/medical concerns         Wheelchair     Assist Is the patient using a wheelchair?: Yes Type of Wheelchair: Manual    Wheelchair assist level: Dependent - Patient 0%      Wheelchair 50 feet with 2 turns activity    Assist        Assist Level: Dependent - Patient 0%   Wheelchair 150 feet activity     Assist      Assist Level: Dependent - Patient 0%   Blood pressure 129/81, pulse 97, temperature 98.3 F (36.8 C), temperature source Oral, resp. rate 16, height 6\' 3"  (1.905 m), weight 90.7 kg, SpO2 96%.  Medical Problem List and Plan: 1. 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  Antithrombotics: -DVT/anticoagulation:  Pharmaceutical: Heparin             -antiplatelet therapy: Aspirin 81 mg daily 3. Neck and back pain: kpad ordered, continue massage with OT  4. Mood/Behavior/Sleep: Provide emotional support             -antipsychotic agents: N/A 5. Neuropsych/cognition: This patient is capable of making decisions on his own behalf. 6. Skin/Wound Care: Routine skin checks 7. Fluids/Electrolytes/Nutrition: Routine in and outs with follow-up chemistries 8.  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  Follow-up outpatient Dr. Mosetta Putt.  Currently not a candidate for more cancer treatment 9.  History of prostate cancer with prostatectomy.  Follow-up outpatient- pt has had leakage for 20 years since surgery done- wears briefs 10.  AKI.  Follow-up chemistries. 11.  Hypertension.  Norvasc 10 mg daily.,  Toprol-XL 12.5 mg daily 12.  Hyperlipidemia.  Lipitor 13.  History of  tobacco use.  Quit smoking 3 months ago.  Provide counseling 14. DNR- made DNR and plans for outpt with hospice when discharged from CIR. Discussed with patient and he expressed wishes to remain DNR, there was some conflicting information in prior palliative care note, so have consulted palliative care to redicuss goals of care with patient  15. Chronic nausea- on oral meds- helpful, but not resolved 16. Cough- notes better with new cough meds.  Will monitor for any worsening of Sx's.  17. Hyponatremia: messaged SLP to see if we can advance him to a regular diet, repeat BMP reviewed and Na decreased to 125, discussed with patient, salt tabs added, consulted medicine  18. Decreased appetite: discussed starting mirtazepine 7.5mg  HS  19. Insomnia: discussed starting mirtazepine 7.5mg  HS  20. Leukocytosis: temperature reviewed and afebrile, repeat tomorrow to trend  21. Cancer-related fatigue: discussed that this can be exacerbated by the Mirtazepine, but will continue this medication since it helped him sleep last night and will stimulate his appetite -start modafinil 100mg  daily today  >50 minutes spent discussing that he may feel daytime fatigue from the carryover effect of the Mirtazepine, discussed modafinil as a stimulant, consulted medicine regarding hyponatremia and added salt tab  LOS: 2 days A FACE TO FACE EVALUATION WAS PERFORMED  Mathew [name] 11/21/2022, 10:40 AM

## 2022-11-21 NOTE — Progress Notes (Signed)
Patient ID: Mathew [name], male   DOB: 1951-04-30, 72 y.o.   MRN: [id number] Met with the patient to review current situation, rehab process, team conference and plan of care. Reviewed current medication for HTN, HLD, A-fib. Reviewed nutritional needs and dietary supplements available as well as food from home.  Reported hospital food tasted bad and he had not appetite. Patient noted poor appetite and decreased food/fluid intake due to nausea/excessive salivation. Concerns forwarded to the MD.  Continue to follow along to address educational needs to facilitate preparation for discharge home with wife.  Patient noted bedroom downstairs but only 1/2 ba and he wants to be able to go upstairs to shower at discharge.  Reviewed team conference on Wednesdays and forwarding wish to his treatment team. Pamelia Hoit

## 2022-11-21 NOTE — Progress Notes (Signed)
NT reported to the RN that family requested patient be showered. NT explained showering is usually done with OT however if nursing staff has time it can be done if requested early in the day not at end of shift. Family quite upset and stated "staff sure is slacking".Both NT and this RN were in and out of patient room frequently today without requests by family or patient to shower until the above mentioned. This RN entered room later for med pass;family requested shower again. This RN explained requests need to be made early in the day and to speak with OT tomorrow to address shower. Family understood.

## 2022-11-22 LAB — BASIC METABOLIC PANEL
Anion gap: 8 (ref 5–15)
BUN: 19 mg/dL (ref 8–23)
CO2: 19 mmol/L — ABNORMAL LOW (ref 22–32)
Calcium: 8.1 mg/dL — ABNORMAL LOW (ref 8.9–10.3)
Chloride: 99 mmol/L (ref 98–111)
Creatinine, Ser: 1.28 mg/dL — ABNORMAL HIGH (ref 0.61–1.24)
GFR, Estimated: >60 mL/min (ref >60)
Glucose, Bld: 85 mg/dL (ref 70–99)
Potassium: 4.2 mmol/L (ref 3.5–5.1)
Sodium: 126 mmol/L — ABNORMAL LOW (ref 135–145)

## 2022-11-22 MED ORDER — ADULT MULTIVITAMIN W/MINERALS CH
1.0000 | ORAL_TABLET | Freq: Every day | ORAL | Status: DC
  Administered 2022-11-22 – 2022-11-28 (×6): 1 via ORAL
  Filled 2022-11-22 (×7): qty 1

## 2022-11-22 MED ORDER — SODIUM CHLORIDE 0.9 % IV SOLN: Freq: Every day | INTRAVENOUS | Status: DC

## 2022-11-22 NOTE — Progress Notes (Signed)
Speech Language Pathology Daily Session Note  Patient Details  Name: Mathew [name] MRN: [id number] Date of Birth: December 25, 1950  Today's Date: 11/22/2022 SLP Individual Time: 0901-1001 SLP Individual Time Calculation (min): 60 min  Short Term Goals: Week 1: SLP Short Term Goal 1 (Week 1): Patient will demonstrate problem solving abilities in complex daily situations with supervision A SLP Short Term Goal 2 (Week 1): Patient will utilize memory compensatory aids with supervision A SLP Short Term Goal 3 (Week 1): Patient will recall memory compensatory aids with supervision A  Skilled Therapeutic Interventions: Skilled therapy intervention focused on cognitive goals. Upon entrance, patient requested to change into new clothes and go to the restroom. Patient able to follow functional directions to safely transport to bathroom and dress with mod I assist. Upon return to chair, SLP facilitated recall of memory strategies. Patient able to recall three WRAP  strategies (write, repeat, associate, picture) independently and one with min verbal cues. SLP encouraged use of memory strategies through auditory comprehension tasks. Patient was read a short functional paragraph (letter, news story) 2x and asked comprehension questions. Patient able to answer independently with 80% accuracy. Patient continues to progress towards set goals. Patient left in chair with alarm set and call bell within reach. Continue POC.   Pain Denies pain.   Therapy/Group: Individual Therapy  Brittini Brubeck M.A., CF-SLP 11/22/2022, 12:22 PM

## 2022-11-22 NOTE — Progress Notes (Signed)
Occupational Therapy Session Note  Patient Details  Name: Mathew [name] MRN: [id number] Date of Birth: 1950/08/11  Today's Date: 11/22/2022 OT Individual Time: 1300-1400 OT Individual Time Calculation (min): 60 min    Short Term Goals: Week 1:  OT Short Term Goal 1 (Week 1): Patient will complete sit to stand from standard surface level with Min guard assist OT Short Term Goal 2 (Week 1): Pt will complete LB dressing with Min A utilizing AE as needed. OT Short Term Goal 3 (Week 1): Pt will completed toileting with Min A  Skilled Therapeutic Interventions/Progress Updates:    Pt resting in recliner upon arrival and agreeable to therapy. OT intervention with focus on sit<>stand, functional amb with RW, bathing at shower level, dressing with sit<>stand from recliner, activity tolerance, and sdafety awareness to increase independence with BADLs. Sit<>stand and amb with RW at [facility]. Pt erquired assistance bathing lower legs and threading pants over LLE. Standing balance with CGA. Pt required rest breaks between individual segments of bathing/dressing. Pt remained seated in recliner. All needs within reach. Belt alarm activated.   Therapy Documentation Precautions:  Precautions Precautions: Fall Precaution Comments: General debility; cancer Restrictions Weight Bearing Restrictions: No LLE Weight Bearing: Weight bearing as tolerated Pain:  Pt denies pain this afternoon   Therapy/Group: Individual Therapy  Rich Brave 11/22/2022, 2:10 PM

## 2022-11-22 NOTE — Progress Notes (Signed)
PROGRESS NOTE   Subjective/Complaints: Sleepy this AM Cr increased, likely due to dehydration, will start fluids tonight Na slightly improved  ROS: +neck and back pain, slept better last night, appetite improved  Objective:   No results found. Recent Labs    11/20/22 0607 11/21/22 0701  WBC 16.0* 14.3*  HGB 12.1* 12.8*  HCT 37.0* 39.6  PLT 410* 434*   Recent Labs    11/21/22 0701 11/22/22 0730  NA 125* 126*  K 3.9 4.2  CL 99 99  CO2 20* 19*  GLUCOSE 81 85  BUN 17 19  CREATININE 1.23 1.28*  CALCIUM 7.9* 8.1*    Intake/Output Summary (Last 24 hours) at 11/22/2022 1358 Last data filed at 11/22/2022 1243 Gross per 24 hour  Intake 914 ml  Output 550 ml  Net 364 ml        Physical Exam: Vital Signs Blood pressure 137/79, pulse 99, temperature 98.4 F (36.9 C), temperature source Oral, resp. rate 17, height 6\' 3"  (1.905 m), weight 90.7 kg, SpO2 97%. Gen: no distress, normal appearing, BMI 24.99 HEENT: oral mucosa pink and moist, NCAT Cardio: Tachycardia Chest: normal effort, normal rate of breathing Abd: soft, non-distended Genitourinary:    Comments: Wearing male purewick- urine on darker side/almost medium beer color Musculoskeletal:     Cervical back: Neck supple.     Comments: RUE- 5/5 in biceps,t riceps, grip and FA LUE- slightly weaker- 5-/5 in same muscles RLE- HF 4/5; KE/KF 4+/5; DF/PF 5-/5 LLE-  HF 3-/5; KE 4/5; DF/PF 4+/5  Cervical myofascial tightness Skin:    General: Skin is warm and dry.     Comments: Infiltrated IV that's been removed in L AC fossa- large pore opening appearance- maybe ~ 3-4 mm opening- with associate slough- and appears to be slightly warm and maybe cellulitis R forearm IV looks OK  Neurological:     Comments: Patient is alert however appears fatigued.  Oriented x 3 and follows commands Intact to light touch in all  4 extremities   Psychiatric:     Comments: Very  flat affect- slightly delayed responses    Assessment/Plan: 1. Functional deficits which require 3+ hours per day of interdisciplinary therapy in a comprehensive inpatient rehab setting. Physiatrist is providing close team supervision and 24 hour management of active medical problems listed below. Physiatrist and rehab team continue to assess barriers to discharge/monitor patient progress toward functional and medical goals  Care Tool:  Bathing    Body parts bathed by patient: Right arm, Abdomen, Chest, Left arm, Front perineal area, Buttocks, Right upper leg, Left upper leg, Face   Body parts bathed by helper: Left lower leg, Right lower leg     Bathing assist Assist Level: Minimal Assistance - Patient > 75%     Upper Body Dressing/Undressing Upper body dressing   What is the patient wearing?: Hospital gown only    Upper body assist Assist Level: Minimal Assistance - Patient > 75%    Lower Body Dressing/Undressing Lower body dressing      What is the patient wearing?: Incontinence brief     Lower body assist Assist for lower body dressing: Total Assistance - Patient <  25%     Toileting Toileting    Toileting assist Assist for toileting: Minimal Assistance - Patient > 75%     Transfers Chair/bed transfer  Transfers assist     Chair/bed transfer assist level: Minimal Assistance - Patient > 75%     Locomotion Ambulation   Ambulation assist      Assist level: Minimal Assistance - Patient > 75% Assistive device: Walker-rolling Max distance: 7ft   Walk 10 feet activity   Assist     Assist level: Minimal Assistance - Patient > 75% Assistive device: Walker-rolling   Walk 50 feet activity   Assist    Assist level: Minimal Assistance - Patient > 75% Assistive device: Walker-rolling    Walk 150 feet activity   Assist Walk 150 feet activity did not occur: Safety/medical concerns (fatigue)         Walk 10 feet on uneven surface   activity   Assist Walk 10 feet on uneven surfaces activity did not occur: Safety/medical concerns         Wheelchair     Assist Is the patient using a wheelchair?: Yes Type of Wheelchair: Manual    Wheelchair assist level: Dependent - Patient 0%      Wheelchair 50 feet with 2 turns activity    Assist        Assist Level: Dependent - Patient 0%   Wheelchair 150 feet activity     Assist      Assist Level: Dependent - Patient 0%   Blood pressure 137/79, pulse 99, temperature 98.4 F (36.9 C), temperature source Oral, resp. rate 17, height 6\' 3"  (1.905 m), weight 90.7 kg, SpO2 97%.  Medical Problem List and Plan: 1. 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  Antithrombotics: -DVT/anticoagulation:  Pharmaceutical: Heparin             -antiplatelet therapy: Aspirin 81 mg daily 3. Neck and back pain: kpad ordered, continue massage with OT  4. Mood/Behavior/Sleep: Provide emotional support             -antipsychotic agents: N/A 5. Neuropsych/cognition: This patient is capable of making decisions on his own behalf. 6. Skin/Wound Care: Routine skin checks 7. Fluids/Electrolytes/Nutrition: Routine in and outs with follow-up chemistries 8.  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  Follow-up outpatient Dr. Mosetta Putt.  Currently not a candidate for more cancer treatment 9.  History of prostate cancer with prostatectomy.  Follow-up outpatient- pt has had leakage for 20 years since surgery done- wears briefs 10.  AKI.  Follow-up chemistries. 11.  Hypertension.  Norvasc 10 mg daily.,  Toprol-XL 12.5 mg daily 12.  Hyperlipidemia.  Lipitor 13.   History of tobacco use.  Quit smoking 3 months ago.  Provide counseling 14. DNR- made DNR and plans for outpt with hospice when discharged from CIR. Discussed with patient and he expressed wishes to remain DNR, there was some conflicting information in prior palliative care note, so have consulted palliative care to redicuss goals of care with patient  15. Chronic nausea- on oral meds- helpful, but not resolved 16. Cough- notes better with new cough  meds. Will monitor for any worsening of Sx's.  17. Hyponatremia: messaged SLP to see if we can advance him to a regular diet, repeat BMP reviewed and Na increased to 126, discussed with patient, salt tabs added, consulted medicine for additional recs, repeat BMP tomorrow  18. Decreased appetite: discussed starting mirtazepine 7.5mg  HS, continued as has helped appetite  19. Insomnia: discussed starting mirtazepine 7.5mg  HS, continue as has helped insomnia  20. Leukocytosis: temperature reviewed and afebrile, repeat tomorrow to trend  21. Cancer-related fatigue: discussed that this can be exacerbated by the Mirtazepine, but will continue this medication since it helped him sleep last night and will stimulate his appetite -start modafinil 100mg  daily today -dietician note reviewed, Boost ordered  22. AKI: fluids started for tonight  >50 minutes spent completing IPOC, patient examined and appeared sleepy from mirtazepine this morning, lasb reviewed, Na increased slightly, creatinine increased, fluids ordered for tonight, dietician note reviewed  LOS: 3 days A FACE TO FACE EVALUATION WAS PERFORMED  Clint Bolder P Ambrosio Reuter 11/22/2022, 1:58 PM

## 2022-11-22 NOTE — Progress Notes (Signed)
Physical Therapy Session Note  Patient Details  Name: Mathew [name] MRN: [id number] Date of Birth: Apr 02, 1951  Today's Date: 11/22/2022 PT Individual Time: 1000-1100 PT Individual Time Calculation (min): 60 min   Short Term Goals: Week 1:  PT Short Term Goal 1 (Week 1): Pt will complete bed mobility with CGA and no hospital bed features PT Short Term Goal 2 (Week 1): Pt will complete bed<>chair transfers with CGA and LRAD PT Short Term Goal 3 (Week 1): Pt will ambulate 166ft with CGA and LRAD PT Short Term Goal 4 (Week 1): Pt will navigate up/down x4 stairs with CGA and LRAD  Skilled Therapeutic Interventions/Progress Updates: Pt presented in recliner agreeable to therapy. Pt denies pain initially but c/o neck pain progressing to HA throughout duration of session. Performed Sit to stand from recliner with minA and ambulated to w/c with RW and CGA. Pt transported to day room for time mangement and energy conservation. Participated in ambulation for endurance 75Ft with CGA however during 180 degree turn pt noted to have decreased L foot clearance. Pt then participated in Sit to stand x 5 from elevated mat to promote increased posterior chain ms recruitment for improved functional transfers. After first stand pt indicated urgency for BM. Pt was able to ambulate with CGA to dayroom bathroom and required minA to sit at lowered toilet. Pt with continent BM/urinary void. Pt was able to complete peri-care with supervision but required minA to stand from lowered toilet and minA for clothing pulling pants over hips (pt was able to pull brief over hips with CGA). Pt then ambulated back to high/low mat in same manner as prior. While completing Sit to stands pt c/o increasing neck pain. Pt agreeable to transfer to high back w/c to allow head to rest. Completed ambulatory transfer to w/c with RW and CGA. Pt then completed seated LE therex including LAQ with 3# wt 2 x 10, seated marches 3# wt 2 x 10, hip ER red  theraband 2 x 10. Pt did require increased time between bouts due to fatigue. Pt then ambulated 66ft back towards room with RW and CGA. Pt with forward flexed posture and noted to heavily rely on BUE on RW. Pt transported remaining distance back to room and performed ambulatory transfer to recliner with RW in same manner as prior. Pt left in recliner at end of session with belt alarm on, call bell within reach and needs met.      Therapy Documentation Precautions:  Precautions Precautions: Fall Precaution Comments: General debility; cancer Restrictions Weight Bearing Restrictions: No LLE Weight Bearing: Weight bearing as tolerated General:   Vital Signs: Therapy Vitals Temp: 98.4 F (36.9 C) Temp Source: Oral Pulse Rate: 99 Resp: 17 BP: 137/79 Patient Position (if appropriate): Sitting Oxygen Therapy SpO2: 97 % O2 Device: Room Air Pain:   Mobility:   Locomotion :    Trunk/Postural Assessment :    Balance:   Exercises:   Other Treatments:      Therapy/Group: Individual Therapy  Mathew [name] 11/22/2022, 4:39 PM

## 2022-11-22 NOTE — IPOC Note (Signed)
Overall Plan of Care [facility]) Patient Details Name: Mathew [name] MRN: [id number] DOB: 1951/01/28  Admitting Diagnosis: Debility  Hospital Problems: Principal Problem:   Debility Active Problems:   Acute thoracic aortic dissection (HCC)     Functional Problem List: Nursing Nutrition, Medication Management, Endurance, Safety  PT Balance, Endurance, Motor, Pain, Safety, Skin Integrity  OT Balance, Endurance, Motor, Pain, Safety  SLP Cognition  TR         Basic ADL's: OT Grooming, Bathing, Dressing, Toileting     Advanced  ADL's: OT       Transfers: PT Bed Mobility, Bed to Chair, Car  OT Toilet, Tub/Shower     Locomotion: PT Ambulation, Stairs     Additional Impairments: OT    SLP Social Cognition   Problem Solving, Memory  TR      Anticipated Outcomes Item Anticipated Outcome  Self Feeding    Swallowing      Basic self-care  SBA  Toileting  Mod I   Bathroom Transfers Mod I  Bowel/Bladder  n/a  Transfers  Supervision  Locomotion  Supervision  Communication     Cognition  mod I  Pain  n/a  Safety/Judgment  manage w cues   Therapy Plan: PT Intensity: Minimum of 1-2 x/day ,45 to 90 minutes PT Frequency: 5 out of 7 days PT Duration Estimated Length of Stay: 10-12 days OT Intensity: Minimum of 1-2 x/day, 45 to 90 minutes OT Frequency: 5 out of 7 days OT Duration/Estimated Length of Stay: 14-18 days SLP Intensity: Minumum of 1-2 x/day, 30 to 90 minutes SLP Frequency: 1 to 3 out of 7 days SLP Duration/Estimated Length of Stay: 7/29   Team Interventions: Nursing Interventions Disease Management/Prevention, Discharge Planning, Psychosocial Support, Patient/Family Education, Medication Management  PT interventions Ambulation/gait training, Discharge planning, Functional mobility training, Psychosocial support, Visual/perceptual remediation/compensation, Wheelchair propulsion/positioning, Therapeutic Exercise, Warden/ranger, Disease  management/prevention, Neuromuscular re-education, Skin care/wound management, Cognitive remediation/compensation, DME/adaptive equipment instruction, Pain management, Splinting/orthotics, UE/LE Strength taining/ROM, Community reintegration, Development worker, international aid stimulation, Patient/family education, UE/LE Coordination activities, Museum/gallery curator  OT Interventions Warden/ranger, Neuromuscular re-education, Self Care/advanced ADL retraining, Therapeutic Exercise, UE/LE Strength taining/ROM, Wheelchair propulsion/positioning, DME/adaptive equipment instruction, Pain management, UE/LE Coordination activities, Patient/family education, Functional electrical stimulation, Community reintegration, Discharge planning, Functional mobility training, Psychosocial support, Therapeutic Activities  SLP Interventions Cognitive remediation/compensation, Internal/external aids, Therapeutic Activities, Cueing hierarchy, Functional tasks, Patient/family education, Therapeutic Exercise  TR Interventions    SW/CM Interventions Discharge Planning, Psychosocial Support, Patient/Family Education   Barriers to Discharge MD  Medical stability  Nursing Decreased caregiver support, Home environment access/layout 2 level , main B+B 2 ste bil rails w spouse  PT Home environment access/layout, Lack of/limited family support, Incontinence, Insurance for SNF coverage    OT Other (comments) active cancer diagnosis  SLP      SW Insurance for SNF coverage     Team Discharge Planning: Destination: PT-Home ,OT- Home , SLP-Home Projected Follow-up: PT-Home health PT, OT-  Home health OT, Other (comment), SLP-Outpatient SLP Projected Equipment Needs: PT-To be determined, OT- 3 in 1 bedside comode, Tub/shower seat, To be determined, SLP-None recommended by SLP Equipment Details: PT- , OT-SPC Patient/family involved in discharge planning: PT- Patient,  OT-Patient, SLP-Patient  MD ELOS: 14-18 days Medical Rehab  Prognosis:  Excellent Assessment: The patient has been admitted for CIR therapies with the diagnosis of acute bilateral multifocal infarcts. The team will be addressing functional mobility, strength, stamina, balance, safety, adaptive techniques and equipment, self-care, bowel and bladder mgt, patient  and caregiver education. Goals have been set at supervision. Anticipated discharge destination is home.         See Team Conference Notes for weekly updates to the plan of care

## 2022-11-23 ENCOUNTER — Inpatient Hospital Stay (HOSPITAL_COMMUNITY): Payer: BC Managed Care – PPO

## 2022-11-23 DIAGNOSIS — R059 Cough, unspecified: Secondary | ICD-10-CM

## 2022-11-23 DIAGNOSIS — R5383 Other fatigue: Secondary | ICD-10-CM

## 2022-11-23 DIAGNOSIS — R000 Tachycardia, unspecified: Secondary | ICD-10-CM

## 2022-11-23 DIAGNOSIS — R601 Generalized edema: Secondary | ICD-10-CM

## 2022-11-23 LAB — BASIC METABOLIC PANEL
Anion gap: 9 (ref 5–15)
BUN: 19 mg/dL (ref 8–23)
CO2: 19 mmol/L — ABNORMAL LOW (ref 22–32)
Calcium: 8 mg/dL — ABNORMAL LOW (ref 8.9–10.3)
Chloride: 102 mmol/L (ref 98–111)
Creatinine, Ser: 1.13 mg/dL (ref 0.61–1.24)
GFR, Estimated: >60 mL/min (ref >60)
Glucose, Bld: 98 mg/dL (ref 70–99)
Potassium: 3.8 mmol/L (ref 3.5–5.1)
Sodium: 130 mmol/L — ABNORMAL LOW (ref 135–145)

## 2022-11-23 MED ORDER — SCOPOLAMINE 1 MG/3DAYS TD PT72
1.0000 | MEDICATED_PATCH | TRANSDERMAL | Status: DC
  Administered 2022-11-23 – 2022-11-26 (×2): 1.5 mg via TRANSDERMAL
  Filled 2022-11-23 (×2): qty 1

## 2022-11-23 NOTE — Progress Notes (Signed)
Speech Language Pathology Daily Session Note  Patient Details  Name: Stancil Deisher MRN: [id number] Date of Birth: 03-Mar-1951  Today's Date: 11/23/2022 SLP Individual Time: 1030-1115 SLP Individual Time Calculation (min): 45 min  Short Term Goals: Week 1: SLP Short Term Goal 1 (Week 1): Patient will demonstrate problem solving abilities in complex daily situations with supervision A SLP Short Term Goal 2 (Week 1): Patient will utilize memory compensatory aids with supervision A SLP Short Term Goal 3 (Week 1): Patient will recall memory compensatory aids with supervision A  Skilled Therapeutic Interventions:   Pt was seen for skilled ST services targeting cognitive-communication. Upon entering room, pt was asleep. Initially, he reported nausea and that he did not want ST at this time. After ensuring pt would stay in bed, he was more agreeable. Pt made reports of anxiety (pt used to be on lexapro but has not taken this hospital stay) and decreased appetite. SLP has reached out to doctor to report.   SLP facilitated session by edu on external memory aids re: calendar, notebook, alarms, pill box, placing items in the same place. SLP provided pt with handout. Pt reports he could benefit from using calendar, notebook for writing important information, and pill box. He was not able to recall what speech therapy had been working on with him. SLP reviewed his goals.   Pt was left in room, all immediate needs within reach, and all safety measures activated. Continue with current ST POC.   Pain Pain Assessment Pain Scale: 0-10 Pain Score: 0-No pain  Therapy/Group: Individual Therapy  Dorena Bodo 11/23/2022, 12:13 PM

## 2022-11-23 NOTE — Progress Notes (Signed)
Patient having multiple episodes throughout the evening hours. Witnessed two and one emesis bag full when entering room for assessment. Emesis thin but [place] and orange in color. Also, observed having a dry cough as well as a productive cough with mucous. Observed in mouth on soft palate and around uvula thin clean tenacious secretions. Patient suctioning self multiple times this evening. Cannister changed in old cannister frothy red tinged thick mucus. Is having nasal congestion with epistaxis this evening. Dark thick red blood produced from nose. Endorses no change to vision no headaches or chest discomfort. Explains BM today. Stomach is distended. Denies any nausea. Urine continues to be amber/beer color. Lung sounds are clear upper lobes and diminished lower lobes.  On call provider contacted. Scopolamine patch ordered by Provider to alleviate current emesis episodes. PRN medication given for cough and congestion. Continue to update accordingly.

## 2022-11-23 NOTE — Progress Notes (Signed)
PROGRESS NOTE   Subjective/Complaints: BL LE EDEMA - 2+ pitting, R>L, new today; albumin 1.7 last check. Encouraged drinking boost 3x daily and wearig TED hose. CXR for SOB. Daily weights.  Mild tachycardia this AM Na improved today 130 from 126; Aki resolved LBM 7/19  ROS: + Edema + SOB  Objective:   No results found. Recent Labs    11/21/22 0701  WBC 14.3*  HGB 12.8*  HCT 39.6  PLT 434*   Recent Labs    11/22/22 0730 11/23/22 0529  NA 126* 130*  K 4.2 3.8  CL 99 102  CO2 19* 19*  GLUCOSE 85 98  BUN 19 19  CREATININE 1.28* 1.13  CALCIUM 8.1* 8.0*    Intake/Output Summary (Last 24 hours) at 11/23/2022 1011 Last data filed at 11/23/2022 0914 Gross per 24 hour  Intake 1098 ml  Output 675 ml  Net 423 ml        Physical Exam: Vital Signs Blood pressure (!) 147/79, pulse (!) 106, temperature 98.8 F (37.1 C), temperature source Axillary, resp. rate 16, height 6\' 3"  (1.905 m), weight 90.7 kg, SpO2 96%. Gen: no distress, normal appearing, BMI 24.99 HEENT: oral mucosa pink and moist, NCAT Cardio: RRR. No m/r/g. BL LE EDEMA - 2+ pitting, R>L, new today; Chest: normal effort, normal rate of breathing Abd: soft, non-distended  Genitourinary:    Comments: no current purwick  Musculoskeletal:     Cervical back: Neck supple.  RUE- 5/5 in biceps,t riceps, grip and FA LUE- slightly weaker- 5-/5 in same muscles RLE- HF 4/5; KE/KF 4+/5; DF/PF 5-/5 LLE-  HF 3-/5; KE 4/5; DF/PF 4+/5  Cervical myofascial tightness  Skin:    General: Skin is warm and dry.     Comments: Infiltrated IV that's been removed in L AC fossa- large pore opening appearance- maybe ~ 3-4 mm opening- with associate slough- appears stable / improved R forearm IV - ok  Neurological:     Comments: AA, Oriented x 3 and follows commands. Mild cognitive delay, fatigue. Appropriate. Moving all 4 limbs antigravity.  Intact to light touch in all   4 extremities   Psychiatric:     Comments: Very flat affect- slightly delayed responses    Assessment/Plan: 1. Functional deficits which require 3+ hours per day of interdisciplinary therapy in a comprehensive inpatient rehab setting. Physiatrist is providing close team supervision and 24 hour management of active medical problems listed below. Physiatrist and rehab team continue to assess barriers to discharge/monitor patient progress toward functional and medical goals  Care Tool:  Bathing    Body parts bathed by patient: Right arm, Abdomen, Chest, Left arm, Front perineal area, Buttocks, Right upper leg, Left upper leg, Face   Body parts bathed by helper: Right lower leg, Left lower leg     Bathing assist Assist Level: Minimal Assistance - Patient > 75%     Upper Body Dressing/Undressing Upper body dressing   What is the patient wearing?: Pull over shirt    Upper body assist Assist Level: Supervision/Verbal cueing    Lower Body Dressing/Undressing Lower body dressing      What is the patient wearing?: Pants, Underwear/pull up  Lower body assist Assist for lower body dressing: Minimal Assistance - Patient > 75%     Toileting Toileting    Toileting assist Assist for toileting: Supervision/Verbal cueing     Transfers Chair/bed transfer  Transfers assist     Chair/bed transfer assist level: Minimal Assistance - Patient > 75%     Locomotion Ambulation   Ambulation assist      Assist level: Minimal Assistance - Patient > 75% Assistive device: Walker-rolling Max distance: 20ft   Walk 10 feet activity   Assist     Assist level: Minimal Assistance - Patient > 75% Assistive device: Walker-rolling   Walk 50 feet activity   Assist    Assist level: Minimal Assistance - Patient > 75% Assistive device: Walker-rolling    Walk 150 feet activity   Assist Walk 150 feet activity did not occur: Safety/medical concerns (fatigue)          Walk 10 feet on uneven surface  activity   Assist Walk 10 feet on uneven surfaces activity did not occur: Safety/medical concerns         Wheelchair     Assist Is the patient using a wheelchair?: Yes Type of Wheelchair: Manual    Wheelchair assist level: Dependent - Patient 0%      Wheelchair 50 feet with 2 turns activity    Assist        Assist Level: Dependent - Patient 0%   Wheelchair 150 feet activity     Assist      Assist Level: Dependent - Patient 0%   Blood pressure (!) 147/79, pulse (!) 106, temperature 98.8 F (37.1 C), temperature source Axillary, resp. rate 16, height 6\' 3"  (1.905 m), weight 90.7 kg, SpO2 96%.  Medical Problem List and Plan: 1. Functional deficits secondary to acute bilateral multifocal embolic/ischemic infarcts after ruptured type B aortic dissection status post emergent T EVAR complicated by spinal cord ischemia             -patient may  shower- if covers dressing/incision- be careful             -ELOS/Goals: 14-18 days min A to supervision  Grounds pass ordered  Continue CIR  2.  Antithrombotics: -DVT/anticoagulation:  Pharmaceutical: Heparin             -antiplatelet therapy: Aspirin 81 mg daily 3. Neck and back pain: kpad ordered, continue massage with OT  4. Mood/Behavior/Sleep: Provide emotional support             -antipsychotic agents: N/A 5. Neuropsych/cognition: This patient is capable of making decisions on his own behalf. 6. Skin/Wound Care: Routine skin checks 7. Fluids/Electrolytes/Nutrition: Routine in and outs with follow-up chemistries 8.  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  Follow-up outpatient Dr. Mosetta Putt.  Currently not a candidate for more cancer treatment 9.  History of prostate cancer with prostatectomy.  Follow-up outpatient- pt has had leakage for 20 years since surgery done- wears briefs 10.  AKI.  Follow-up chemistries. 11.  Hypertension.  Norvasc 10 mg daily.,  Toprol-XL 12.5  mg daily 12.  Hyperlipidemia.  Lipitor 13.  History of tobacco use.  Quit smoking 3 months ago.  Provide counseling 14. DNR- made DNR and plans for outpt with hospice when discharged from CIR. Discussed with patient and he expressed wishes to remain DNR, there was some conflicting information in prior palliative care note, so have consulted palliative care to redicuss goals of care with patient  15. Chronic nausea- on oral  meds- helpful, but not resolved 16. Cough- notes better with new cough meds. Will monitor for any worsening of Sx's.   - 7/20: CXR mild bibasilar atelectasis; otherwise clear. ISB.  17. Hyponatremia: messaged SLP to see if we can advance him to a regular diet, repeat BMP reviewed and Na increased to 126, discussed with patient, salt tabs added, consulted medicine for additional recs, repeat BMP tomorrow   - 7/20: Na improved to 130, continue maintenance IVF d.t tachycardia and monitorring  18. Decreased appetite: discussed starting mirtazepine 7.5mg  HS, continued as has helped appetite  19. Insomnia: discussed starting mirtazepine 7.5mg  HS, continue as has helped insomnia  20. Leukocytosis: temperature reviewed and afebrile, repeat tomorrow to trend  21. Cancer-related fatigue/anasarca: discussed that this can be exacerbated by the Mirtazepine, but will continue this medication since it helped him sleep last night and will stimulate his appetite -start modafinil 100mg  daily today -dietician note reviewed, Boost ordered  - 7/20: Discussed need for increased protein intake d/t third spacing IVF; CXR as abbove; TEDs for LE edema. Daily weights for retention.   22. AKI: fluids started for tonight  - improved    LOS: 4 days A FACE TO FACE EVALUATION WAS PERFORMED  Mathew [name] 11/23/2022, 10:11 AM

## 2022-11-24 ENCOUNTER — Inpatient Hospital Stay (HOSPITAL_COMMUNITY): Payer: BC Managed Care – PPO

## 2022-11-24 DIAGNOSIS — R630 Anorexia: Secondary | ICD-10-CM

## 2022-11-24 LAB — COMPREHENSIVE METABOLIC PANEL
ALT: 20 U/L (ref 0–44)
AST: 157 U/L — ABNORMAL HIGH (ref 15–41)
Albumin: 1.6 g/dL — ABNORMAL LOW (ref 3.5–5.0)
Alkaline Phosphatase: 169 U/L — ABNORMAL HIGH (ref 38–126)
Anion gap: 13 (ref 5–15)
BUN: 13 mg/dL (ref 8–23)
CO2: 19 mmol/L — ABNORMAL LOW (ref 22–32)
Calcium: 8.4 mg/dL — ABNORMAL LOW (ref 8.9–10.3)
Chloride: 100 mmol/L (ref 98–111)
Creatinine, Ser: 1.16 mg/dL (ref 0.61–1.24)
GFR, Estimated: >60 mL/min (ref >60)
Glucose, Bld: 105 mg/dL — ABNORMAL HIGH (ref 70–99)
Potassium: 3.6 mmol/L (ref 3.5–5.1)
Sodium: 132 mmol/L — ABNORMAL LOW (ref 135–145)
Total Bilirubin: 1.5 mg/dL — ABNORMAL HIGH (ref 0.3–1.2)
Total Protein: 8 g/dL (ref 6.5–8.1)

## 2022-11-24 LAB — MAGNESIUM: Magnesium: 1.6 mg/dL — ABNORMAL LOW (ref 1.7–2.4)

## 2022-11-24 LAB — PHOSPHORUS: Phosphorus: 2.6 mg/dL (ref 2.5–4.6)

## 2022-11-24 LAB — GLUCOSE, CAPILLARY: Glucose-Capillary: 104 mg/dL — ABNORMAL HIGH (ref 70–99)

## 2022-11-24 MED ORDER — ONDANSETRON HCL 4 MG/2ML IJ SOLN
4.0000 mg | Freq: Three times a day (TID) | INTRAMUSCULAR | Status: DC | PRN
  Administered 2022-11-24 – 2022-11-29 (×3): 4 mg via INTRAVENOUS
  Filled 2022-11-24 (×3): qty 2

## 2022-11-24 MED ORDER — DRONABINOL 2.5 MG PO CAPS
2.5000 mg | ORAL_CAPSULE | Freq: Two times a day (BID) | ORAL | Status: DC
  Administered 2022-11-24 – 2022-11-25 (×4): 2.5 mg via ORAL
  Filled 2022-11-24 (×4): qty 1

## 2022-11-24 NOTE — Progress Notes (Signed)
Occupational Therapy Session Note  Patient Details  Name: Mathew [name] MRN: [id number] Date of Birth: [birth year]  Today's Date: 11/24/2022 OT Individual Time: 0955-1030 OT Individual Time Calculation (min): 35 min    Short Term Goals: Week 1:  OT Short Term Goal 1 (Week 1): Patient will complete sit to stand from standard surface level with Min guard assist OT Short Term Goal 2 (Week 1): Pt will complete LB dressing with Min A utilizing AE as needed. OT Short Term Goal 3 (Week 1): Pt will completed toileting with Min A  Skilled Therapeutic Interventions/Progress Updates:    1:1 Pt reports events of last night and not feeling well and multiple times of emesis throughout night. MD and RN aware.  When entered room pt reported he wanted to get up and shower. Pt came to EOB with contact guard with extra time. With first attempt to stand from standard height bed - pt unable to stand with the min A he had before been requiring. Min guard from elevated bed. Pt ambulated to the bathroom to the toilet with RW with flexed posture and shuffling - with more difficulty picking up his right LE today. In sitting and in supine pt with difficulty moving left LE off bed or lifting to wash or walk required A against gravity. Pt bathed in sitting position with A for washing feet and back. Pt with labored breathing throught and feeling fatigued. Pt reports he can not walk out of the bathroom. Pt transferred with heavier min A to come into standing and perform stand pivot into the w/c. Pt with also increased uncontrolled decent with sitting due to fatigue and weakness. When trying to transfer back to bed pt became incontinent of loose bowel twice requiring total A for cleaning up. Pt performed stand pivot to the bed with mod A and A to lift left Le back into bed. PT left resting in the bed. Miss 25 min due to fatigue and xray and labs tryiing to come in.  Therapy Documentation Precautions:  Precautions Precautions:  Fall Precaution Comments: General debility; cancer Restrictions Weight Bearing Restrictions: No LLE Weight Bearing: Weight bearing as tolerated General:   Vital Signs: Therapy Vitals Pulse Rate: (!) 101 BP: (!) 151/88 Patient Position (if appropriate): Lying Oxygen Therapy SpO2: 96 % O2 Device: Room Air Pain:  Pt with no c/o pain just nausea    Therapy/Group: Individual Therapy  Roney Mans [facility] 11/24/2022, 10:07 AM

## 2022-11-24 NOTE — Progress Notes (Incomplete)
On call provider contacted. Patient having multiple episodes throughout the evening hours. Witnessed two and one emesis bag full when entering room for assessment. Emesis thin but [place] and orange in color. Also, observed having a dry cough as well as a productive cough with mucous. Observed in mouth on soft palate and around uvula thin clean tenacious secretions. Patient suctioning self multiple times this evening. Cannister changed in old cannister frothy red tinged thick mucus. Is having nasal congestion with epistaxis this evening. Dark thick red blood produced from nose while pati

## 2022-11-24 NOTE — Progress Notes (Signed)
Per patient's request given another PRN medication for cough and congestion. Witnessed from time of administration of PRN antiemetic medication and recent PRN medication two more episodes of emesis. Respirations improving. Continues to not be able to sleep due to episodes of emesis. HOB elevation greater than 30 degrees to prevent aspiration.

## 2022-11-24 NOTE — Significant Event (Addendum)
Rapid Response Event Note   Reason for Call :  N/V  Initial Focused Assessment:  Pt lying in bed with eyes open, in no visible distress. He is alert and oriented,  c/o N/V/ABD pain and is not new but seems to has worsened tonight. Lungs clear. ABD large/distended/soft/tender to touch. Skin warm and dry.   Pt is on MIVF(NS @ 50cc/hr). He had a BM today.  He just received an order for zofran IV.  HR-105, BP-151/85, RR-18, SpO2-95% on RA.  Interventions:  No RRT interventions needed  Plan of Care:  Give zofran and monitor response. Continue MIVF. Call RRT if further assistance needed.   Event Summary:   MD Notified: Per bedside RN Call Time:0040 Arrival Time:0047 End Time:0100  Terrilyn Saver, RN

## 2022-11-24 NOTE — Progress Notes (Signed)
SLP Cancellation Note  Patient Details Name: Lavaughn Haberle MRN: [id number] DOB: [birth year]   Cancelled treatment:        Pt unable to participate in SLP session this AM due to pt's active retching and vomiting. RN aware. Per chart, pt had multiple episodes of vomiting previous evening with rapid response called. SLP will hold at this time.                                                                                                 Ellery Plunk 11/24/2022, 7:30 AM

## 2022-11-24 NOTE — Progress Notes (Addendum)
Reporting some improvement with vomiting. In bed awake resting with eyes closed around 0430 this morning. Morning medication given having two more witnessed episodes of emesis this evening. Documented from 1900 to 0700 approximately 11 episodes of vomitus through the night.

## 2022-11-24 NOTE — Progress Notes (Signed)
Endorses not having chest pain or discomfort. Does express the sensation of "something sitting on my chest" as well as the sensation of not feeling taking a complete breath/feels short of breath.

## 2022-11-24 NOTE — Progress Notes (Signed)
PROGRESS NOTE   Subjective/Complaints: Informed by therapies this a.m. that patient had multiple episodes of emesis overnight, approximately 12.  Has had some intermittent tachycardia but overall hemodynamically stable.  Discussed with patient this a.m., he states that this is very similar to the emesis that prompted his initial hospitalization.  He states it is triggered by any kind of p.o. intake, swallowing fluids or foods, things getting stuck in his throat and extremely poor appetite/tolerance for even moderate volumes of food in his stomach.  He does feel somewhat better at time of exam, is trying to take p.o. intake slowly.  Came back around later in the day, patient endorses no further emesis.  He has several questions about his quality of life moving forward in light of cancer diagnosis, not wanting to be a burden on his wife, afraid that things will not improve.  Discussed prior consultation with palliative care, difference between palliative and hospice.  per their note "If he is not going to be offered further chemotherapy then they would want hospice after CIR".   ROS: + Nausea/vomitting - fluctuating + Edema - improved + SOB - improved  Objective:   DG Chest Port 1 View  Result Date: 11/23/2022 CLINICAL DATA:  Shortness of breath EXAM: PORTABLE CHEST 1 VIEW COMPARISON:  Chest x-ray 11/14/2022 FINDINGS: Aortic graft again noted. Loop recorder device overlies the left chest. The cardiomediastinal silhouette is stable. There is no focal lung consolidation, pleural effusion or pneumothorax. There is minimal left base atelectasis. No acute fractures are seen. IMPRESSION: Minimal left base atelectasis. Otherwise no evidence of acute cardiopulmonary process. Electronically Signed   By: Darliss Cheney M.D.   On: 11/23/2022 16:25   No results for input(s): "WBC", "HGB", "HCT", "PLT" in the last 72 hours.  Recent Labs    11/22/22 0730  11/23/22 0529  NA 126* 130*  K 4.2 3.8  CL 99 102  CO2 19* 19*  GLUCOSE 85 98  BUN 19 19  CREATININE 1.28* 1.13  CALCIUM 8.1* 8.0*    Intake/Output Summary (Last 24 hours) at 11/24/2022 1006 Last data filed at 11/24/2022 0915 Gross per 24 hour  Intake 1337.18 ml  Output 2575 ml  Net -1237.82 ml        Physical Exam: Vital Signs Blood pressure (!) 151/88, pulse (!) 101, temperature 98.3 F (36.8 C), temperature source Oral, resp. rate 18, height 6\' 3"  (1.905 m), weight 91.8 kg, SpO2 96%. Gen: no distress, normal appearing, BMI 24.99 HEENT: oral mucosa pink and moist, NCAT Cardio: RRR. No m/r/g. BL LE EDEMA -1+ pitting, improved; Chest: normal effort, normal rate of breathing Abd: soft, non-distended.  Nontender to palpation.  Skin:    General: Skin is warm and dry.     Comments: Infiltrated IV that's been removed in L AC fossa- large pore opening appearance- maybe ~ 3-4 mm opening- with associate slough- appears  improved R forearm IV - ok  Neurological:     Comments: AA, Oriented x 3 and follows commands. Mild cognitive delay, fatigue. Appropriate. Moving all 4 limbs antigravity and against resistance Intact to light touch in all  4 extremities   Psychiatric:  Comments: Very flat affect, slightly depressed.  No SI, but concerned regarding prognosis, being a burden on his family.   Musculoskeletal:  Prior exams RUE- 5/5 in biceps,triceps, grip and FA LUE- slightly weaker- 5-/5 in same muscles RLE- HF 4/5; KE/KF 4+/5; DF/PF 5-/5 LLE-  HF 3-/5; KE 4/5; DF/PF 4+/5  Cervical myofascial tightness  Assessment/Plan: 1. Functional deficits which require 3+ hours per day of interdisciplinary therapy in a comprehensive inpatient rehab setting. Physiatrist is providing close team supervision and 24 hour management of active medical problems listed below. Physiatrist and rehab team continue to assess barriers to discharge/monitor patient progress toward functional and  medical goals  Care Tool:  Bathing    Body parts bathed by patient: Right arm, Abdomen, Chest, Left arm, Front perineal area, Buttocks, Right upper leg, Left upper leg, Face   Body parts bathed by helper: Right lower leg, Left lower leg     Bathing assist Assist Level: Minimal Assistance - Patient > 75%     Upper Body Dressing/Undressing Upper body dressing   What is the patient wearing?: Pull over shirt    Upper body assist Assist Level: Supervision/Verbal cueing    Lower Body Dressing/Undressing Lower body dressing      What is the patient wearing?: Pants, Underwear/pull up     Lower body assist Assist for lower body dressing: Minimal Assistance - Patient > 75%     Toileting Toileting    Toileting assist Assist for toileting: Supervision/Verbal cueing     Transfers Chair/bed transfer  Transfers assist     Chair/bed transfer assist level: Minimal Assistance - Patient > 75%     Locomotion Ambulation   Ambulation assist      Assist level: Minimal Assistance - Patient > 75% Assistive device: Walker-rolling Max distance: 5ft   Walk 10 feet activity   Assist     Assist level: Minimal Assistance - Patient > 75% Assistive device: Walker-rolling   Walk 50 feet activity   Assist    Assist level: Minimal Assistance - Patient > 75% Assistive device: Walker-rolling    Walk 150 feet activity   Assist Walk 150 feet activity did not occur: Safety/medical concerns (fatigue)         Walk 10 feet on uneven surface  activity   Assist Walk 10 feet on uneven surfaces activity did not occur: Safety/medical concerns         Wheelchair     Assist Is the patient using a wheelchair?: Yes Type of Wheelchair: Manual    Wheelchair assist level: Dependent - Patient 0%      Wheelchair 50 feet with 2 turns activity    Assist        Assist Level: Dependent - Patient 0%   Wheelchair 150 feet activity     Assist      Assist  Level: Dependent - Patient 0%   Blood pressure (!) 151/88, pulse (!) 101, temperature 98.3 F (36.8 C), temperature source Oral, resp. rate 18, height 6\' 3"  (1.905 m), weight 91.8 kg, SpO2 96%.  Medical Problem List and Plan: 1. 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  Defer to primary team.  2.  Antithrombotics: -DVT/anticoagulation:  Pharmaceutical: Heparin             -antiplatelet therapy: Aspirin 81 mg daily 3. Neck and back pain: kpad ordered, continue massage with OT  4. Mood/Behavior/Sleep: Provide emotional support             -antipsychotic agents: N/A  5. Neuropsych/cognition: This patient is capable of making decisions on his own behalf.  6. Skin/Wound Care: Routine skin checks 7. Fluids/Electrolytes/Nutrition: Routine in and outs with follow-up chemistries 8.  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  Follow-up outpatient Dr. Mosetta Putt.  Currently not a candidate for more cancer treatment 9.  History of prostate cancer with prostatectomy.  Follow-up outpatient- pt has had leakage for 20 years since surgery done- wears briefs 10.  AKI.  Follow-up chemistries.  -BUN and creatinine stable off of IV fluids 7/20 to 7/21 11.  Hypertension.  Norvasc 10 mg daily.,  Toprol-XL 12.5 mg daily    11/24/2022    1:15 PM 11/24/2022    6:42 AM 11/24/2022    3:27 AM  Vitals with BMI  Weight  202 lbs 6 oz   BMI  25.3   Systolic 118 151  146  Diastolic 85 88 83  Pulse 95 101 105  -Blood pressure well-controlled, monitor  12.  Hyperlipidemia.  Lipitor 13.  History of tobacco use.  Quit smoking 3 months ago.  Provide counseling 14. DNR- made DNR and plans for outpt with hospice when discharged from CIR. Discussed with patient and he expressed wishes to remain DNR, there was some conflicting information in prior palliative care note, so have consulted palliative care to redicuss goals of care with patient  15. Chronic nausea- on oral meds- helpful, but not resolved 16. Cough- notes better with new cough meds. Will monitor for any worsening of Sx's.   - 7/20: CXR mild bibasilar atelectasis; otherwise clear. ISB.  17. Hyponatremia: messaged SLP to see if we can advance him to a regular diet, repeat BMP reviewed and Na increased to 126, discussed with patient, salt tabs added, consulted medicine for additional recs, repeat BMP tomorrow   - 7/20: Na improved to 130, continue maintenance IVF d.t tachycardia and monitorring  18. Decreased appetite/nasue/vomitting: discussed starting mirtazepine 7.5mg  HS, continued as has helped appetite   - 7/21: N/V, loss of appetite today; labs with mild increase/stable ALP and AST, otherwise unchanged.  Start Drabinol 2.5 mg AC BID.  Already on scopolamine patch, as needed IV Zofran.  19. Insomnia: discussed starting mirtazepine 7.5mg  HS, continue as has helped insomnia  20. Leukocytosis: temperature reviewed and afebrile, repeat tomorrow to trend  21. Cancer-related fatigue/anasarca: discussed that this can be exacerbated by the Mirtazepine, but will continue this medication since it helped him sleep last night and will stimulate his appetite -start modafinil 100mg  daily today -dietician note reviewed, Boost ordered  - 7/20: Discussed need for increased protein intake d/t third spacing IVF; CXR as abbove; TEDs for LE edema. Daily weights for retention.  Filed Weights   11/13/2022 1458 11/24/22  0642  Weight: 90.7 kg 91.8 kg   - 7/21: Edema improved with use of teds, monitor   22. AKI: fluids started for tonight  -  improved    LOS: 5 days A FACE TO FACE EVALUATION WAS PERFORMED  Angelina Sheriff 11/24/2022, 10:06 AM

## 2022-11-25 DIAGNOSIS — I639 Cerebral infarction, unspecified: Secondary | ICD-10-CM | POA: Diagnosis not present

## 2022-11-25 DIAGNOSIS — F4321 Adjustment disorder with depressed mood: Secondary | ICD-10-CM | POA: Diagnosis not present

## 2022-11-25 LAB — CBC WITH DIFFERENTIAL/PLATELET
Abs Immature Granulocytes: 0.09 10*3/uL — ABNORMAL HIGH (ref 0.00–0.07)
Basophils Absolute: 0.2 10*3/uL — ABNORMAL HIGH (ref 0.0–0.1)
Basophils Relative: 2 %
Eosinophils Absolute: 0.2 10*3/uL (ref 0.0–0.5)
Eosinophils Relative: 2 %
HCT: 38.5 % — ABNORMAL LOW (ref 39.0–52.0)
Hemoglobin: 12.3 g/dL — ABNORMAL LOW (ref 13.0–17.0)
Immature Granulocytes: 1 %
Lymphocytes Relative: 22 %
Lymphs Abs: 2.9 10*3/uL (ref 0.7–4.0)
MCH: 25.7 pg — ABNORMAL LOW (ref 26.0–34.0)
MCHC: 31.9 g/dL (ref 30.0–36.0)
MCV: 80.5 fL (ref 80.0–100.0)
Monocytes Absolute: 1.7 10*3/uL — ABNORMAL HIGH (ref 0.1–1.0)
Monocytes Relative: 13 %
Neutro Abs: 8.2 10*3/uL — ABNORMAL HIGH (ref 1.7–7.7)
Neutrophils Relative %: 60 %
Platelets: 412 10*3/uL — ABNORMAL HIGH (ref 150–400)
RBC: 4.78 MIL/uL (ref 4.22–5.81)
RDW: 21.3 % — ABNORMAL HIGH (ref 11.5–15.5)
WBC: 13.3 10*3/uL — ABNORMAL HIGH (ref 4.0–10.5)
nRBC: 0.3 % — ABNORMAL HIGH (ref 0.0–0.2)

## 2022-11-25 MED ORDER — MIRTAZAPINE 15 MG PO TABS
15.0000 mg | ORAL_TABLET | Freq: Every day | ORAL | Status: DC
  Administered 2022-11-25: 15 mg via ORAL
  Filled 2022-11-25: qty 1

## 2022-11-25 MED ORDER — AMLODIPINE BESYLATE 5 MG PO TABS
5.0000 mg | ORAL_TABLET | Freq: Every day | ORAL | Status: DC
  Administered 2022-11-26 – 2022-11-28 (×3): 5 mg via ORAL
  Filled 2022-11-25 (×3): qty 1

## 2022-11-25 NOTE — Consult Note (Signed)
Neuropsychological Consultation Comprehensive Inpatient Rehab   Patient:   Mathew [name]   DOB:   [birth year]  MR Number:  [id number]  Location:  [facility] [address] CENTER A [address] [place] [address] Dept: [phone] Loc: [phone]           Date of Service:   11/25/2022  Start Time:   8 AM End Time:   9 AM  Provider/Observer:  Arley Phenix, Psy.D.       Clinical Neuropsychologist       Billing Code/Service: [phone]  Reason for Service:    Mathew [name] is a 72 year old male referred for neuropsychological consultation due to coping and adjustment issues with significant medical complications and currently admitted onto the comprehensive inpatient rehabilitation unit.  Patient has a past medical history including hepatocellular carcinoma with hepatitis C, bilateral renal cell carcinoma, PAF, and past history of prostate cancer with prostatectomy.  Patient presented on 10/25/2022 with chest pain radiating to abdomen.  Initially felt heart related NSTEMI code was initially called.  Patient was noted to have type B aortic dissection extending from the distal aortic arch to the level of the diaphragm.  Patient was ultimately discharged on 10/29/2022 but returned on 6/26 with worsening pain confirming aortic rupture requiring vascular repair.  MRI brain showed acute infarcts in the right cerebellum, splenium of the corpus callosum on the left and right occipital lobe, right parietal lobe and left lentiform nucleus.  Patient has had follow-up oncology reviewed identified metastatic hepatocellular carcinoma to the lungs with patient not a candidate for further cancer treatment and patient has had reviewed with palliative care and goals of care established and currently DNR.  Patient was admitted to CIR due to deconditioning and left-sided weakness.  The patient was awake and alert but fatigued this morning.  He was laying in his bed  with his head slightly elevated.  Patient was oriented and aware of his current status on the inpatient rehab unit.  Patient acknowledged significant weakness and concerns about being able to strengthen to the point of taking care of himself.  Patient noted that he had been independent and functioning prior to admission.  Patient was generally aware of recent medical complications but had little depth to his understanding of status.  Patient noted that his wife had been the primary receiver of medical information throughout his hospital course.  Patient acknowledged frustration and depressive feelings but notes that he continues to be motivated and hoping to regain some strength and coping for independence.  Long conversation was made regarding palliative care and goals of care and patient was comfortable but noted repeatedly awareness of his wristband and noting his DNR status.    HPI for the current admission:    HPI: Mathew [name] is a 72 year old right-handed male with history 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 Per chart review patient lives with spouse. Two-level home bed bath main level 2 steps to entry. Independent and working prior to admission. Presented 10/25/2022 with chest pain radiating to the abdomen. 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 He was started on nitroglycerin infusion. Given need for tight hemodynamic control PCCM  consulted for further management and admission. VVS and cardiology consulted. Initially required Esmolol drip, weaned off with the addition of multiple p.o. medications. Blood pressures normalized and patient was deemed stable for discharge 10/29/2022 due to no  expansion of dissection ultimately returning on 6/26 for worsening pain or imaging confirmed aortic rupture requiring endovascular repair by vascular surgery Dr. Chestine Spore. Hospital course 11/01/2022 patient with left lower extremity weakness of acute onset. 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 CT angiogram head and neck showed no acute intracranial process no intracranial large vessel occlusion or significant stenosis. No hemodynamically significant stenosis in the carotid and vertebral arteries. Neurology follow-up and patient was cleared to begin low-dose aspirin. 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 Mosetta Putt and patient not felt to be a candidate for further cancer treatments at this time and patient is currently DNR. Mild AKI improved with gentle IV fluids latest creatinine 1.17. Patient was cleared to begin subcutaneous heparin for DVT prophylaxis. Palliative care was consulted to establish goals of care. His diet has been advanced to regular consistency. Therapy evaluations completed due to patient's deconditioning as well as left-sided weakness was admitted for a comprehensive rehab program.   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) 10/30/2022   Normal anion gap metabolic acidosis 10/30/2022   Malignant hypertension 10/28/2022   AKI (acute kidney injury) (HCC) 10/27/2022   Hypomagnesemia 10/27/2022   Metastatic malignant neoplasm (HCC) 10/26/2022   Dissection of descending thoracic aorta (HCC) 10/25/2022   Hepatocellular carcinoma (HCC) 09/03/2022   Liver mass 08/21/2022   Medication side effect 10/16/2021   IPF (idiopathic pulmonary fibrosis) (HCC) 08/17/2021   Smoker 08/17/2021    Behavioral Observation/Mental Status:   Mathew [name]  presents as a 72 y.o.-year-old Right handed African American Male who appeared his stated age. his dress was Appropriate and he was Well Groomed and his manners were Appropriate to the situation.  his participation was indicative of Appropriate behaviors.  There were physical disabilities noted.  he displayed an appropriate level of cooperation and motivation.    Interactions:    Active Appropriate and Redirectable  Attention:   abnormal and attention span appeared shorter than expected for age  Memory:   abnormal; remote memory intact, recent memory impaired  Visuo-spatial:   not examined  Speech (Volume):  low  Speech:   normal; normal  Thought Process:  Coherent and Tangential  Coherent and Logical  Though Content:  WNL; not suicidal and not homicidal  Orientation:   person, place, time/date, and situation  Judgment:   Fair  Planning:   Fair  Affect:    Depressed and Lethargic  Mood:    Dysphoric  Insight:   Fair  Intelligence:   normal  Psychiatric History:  No prior psychiatric history noted  Family Med/Psych History:  Family History  Problem Relation Age of Onset   Cancer Brother        colon cancer and prostate cancer   Heart attack Neg Hx     Impression/DX:   Mathew [name] is a 72 year old male referred for neuropsychological consultation due to coping and adjustment issues with significant medical complications and currently  admitted onto the comprehensive inpatient rehabilitation unit.  Patient has a past medical history including hepatocellular carcinoma with hepatitis C, bilateral renal cell carcinoma, PAF, and past history of prostate cancer with prostatectomy.  Patient presented on 10/25/2022 with chest pain radiating to abdomen.  Initially felt heart related NSTEMI code was initially called.  Patient was noted to have type B aortic dissection extending from the distal aortic arch to the level of the diaphragm.  Patient was ultimately discharged on 10/29/2022 but returned on 6/26 with worsening pain confirming aortic rupture requiring vascular repair.  MRI brain showed acute infarcts in the right cerebellum, splenium of the corpus callosum on the left and right occipital lobe, right parietal lobe and left lentiform nucleus.  Patient has had follow-up oncology reviewed identified metastatic hepatocellular carcinoma to the lungs with patient not a candidate for further cancer treatment and patient has had reviewed with palliative care and goals of care established and currently DNR.  Patient was admitted to CIR due to deconditioning and left-sided weakness.  The patient was awake and alert but fatigued this morning.  He was laying in his bed with his head slightly elevated.  Patient was oriented and aware of his current status on the inpatient rehab unit.  Patient acknowledged significant weakness and concerns about being able to strengthen to the point of taking care of himself.  Patient noted that he had been independent and functioning prior to admission.  Patient was generally aware of recent medical complications but had little depth to his understanding of status.  Patient noted that his wife had been the primary receiver of medical information throughout his hospital course.  Patient acknowledged frustration and depressive feelings but notes that he continues to be motivated and hoping to regain some strength and coping for  independence.  Long conversation was made regarding palliative care and goals of care and patient was comfortable but noted repeatedly awareness of his wristband and noting his DNR status.    Disposition/Plan:  Today we worked on coping and adjustment issues and answered questions regarding his overall medical status and expectations.  Patient noted recent discussions with palliative care and was comfortable with decisions that have been made so far.  Patient motivated to get stronger so he can return home and be as independent as possible.  Diagnosis:    Adjustment disorder with depressed mood         Electronically Signed   _______________________ Arley Phenix, Psy.D. Clinical Neuropsychologist

## 2022-11-25 NOTE — Progress Notes (Signed)
Occupational Therapy Session Note  Patient Details  Name: Mathew [name] MRN: [id number] Date of Birth: [birth year]  Today's Date: 11/25/2022 OT Individual Time: 1118-1200 OT Individual Time Calculation (min): 42 min    Short Term Goals: Week 1:  OT Short Term Goal 1 (Week 1): Patient will complete sit to stand from standard surface level with Min guard assist OT Short Term Goal 2 (Week 1): Pt will complete LB dressing with Min A utilizing AE as needed. OT Short Term Goal 3 (Week 1): Pt will completed toileting with Min A  Skilled Therapeutic Interventions/Progress Updates:      Therapy Documentation Precautions:  Precautions Precautions: Fall Precaution Comments: General debility; cancer Restrictions Weight Bearing Restrictions: Yes LLE Weight Bearing: Weight bearing as tolerated General: "Hi there" Pt seated in W/C upon OT arrival, agreeable to OT.  Pain: No pain reported/noted   ADL: Toilet transfer: Min A with RW, decreased eccentric control when lowering to sit, VC for safety required Toileting: Mod A, assistance required for BM management and some assistance for pants over waist, BM recorded in I/O, increased time required  Transfers: Min A for all transfers completed this date with RW, VC for safety and technique Bed mobility: mod A, assistance with managing legs into bed after session, pt able to scoot up in bed using bed rails CGA      Exercises: Pt completed the following exercise circuit in order to improve functional activity, strength and endurance to prepare for ADLs such as bathing. Pt completed the following exercises in seated position with no noted LOB/SOB and 3x10 repetitions on each exercise. PRN rest breaks during trials: -modified hamstring curls using red theraband for resistance seated in W/C -seated marches with hand over hand assistance for Lt leg d/t weakness -modified leg extensions with red theraband seated in W/C  -bicep curls with red theraband    Other Treatments:  Pt educated on energy conservation techniques.   Pt supine in bed with bed alarm activated, 2 bed rails up, call light within reach and 4Ps assessed.   Therapy/Group: Individual Therapy  Velia Meyer, OTD, OTR/L 11/25/2022, 12:47 PM

## 2022-11-25 NOTE — Progress Notes (Signed)
Physical Therapy Session Note  Patient Details  Name: Mathew [name] MRN: [id number] Date of Birth: 25-Jun-1950  Today's Date: 11/25/2022 PT Individual Time: 1430-1530 PT Individual Time Calculation (min): 60 min   Short Term Goals: Week 1:  PT Short Term Goal 1 (Week 1): Pt will complete bed mobility with CGA and no hospital bed features PT Short Term Goal 2 (Week 1): Pt will complete bed<>chair transfers with CGA and LRAD PT Short Term Goal 3 (Week 1): Pt will ambulate 126ft with CGA and LRAD PT Short Term Goal 4 (Week 1): Pt will navigate up/down x4 stairs with CGA and LRAD  Skilled Therapeutic Interventions/Progress Updates:      Therapy Documentation Precautions:  Precautions Precautions: Fall Precaution Comments: General debility; cancer Restrictions Weight Bearing Restrictions: No LLE Weight Bearing: Weight bearing as tolerated  Pt received semi-reclined in bed with spouse present and agreeable to PT. Pt without reports of pain and requires min A with supine to sit and mod A with sit <>stand from significantly elevated bed. Pt min A with heavy cues for walker management as pt with tendency to keep walker far ahead. Pt requested to perform leg bike and PT provided pt with portable bike pedal due to fatigue and propelled x 5 min to address activity tolerance deficits. Pt performed following exercises to address UE/LE deficits:   -resisted red theraband hip abduction 3 x 10   -5# weighted bar OH press, bicep curl, chest press 1 x 10  Pt left seated in recliner at bedside with all needs in reach and refused alarm.    Therapy/Group: Individual Therapy  Truitt Leep Truitt Leep PT, DPT  11/25/2022, 5:50 AM

## 2022-11-25 NOTE — Progress Notes (Signed)
Occupational Therapy Session Note  Patient Details  Name: Mathew [name] MRN: [id number] Date of Birth: Dec 17, 1950  Today's Date: 11/25/2022 OT Individual Time: 8657-8469 OT Individual Time Calculation (min): 55 min    Short Term Goals: Week 1:  OT Short Term Goal 1 (Week 1): Patient will complete sit to stand from standard surface level with Min guard assist OT Short Term Goal 2 (Week 1): Pt will complete LB dressing with Min A utilizing AE as needed. OT Short Term Goal 3 (Week 1): Pt will completed toileting with Min A  Skilled Therapeutic Interventions/Progress Updates:    Patient presents supine in bed.  Patient agreeable to get out of bed.  Patient physically moving left leg into flexed position to roll toward right.  Patient heavily relying on bed rails to pull up, flattened bed, and educated patient on sidelying to sit with use of flexion/ rotation and pushing with Ue's into surface to sit up.  Patient unable to stand from edge of bed.  Elevated the bed considerably - patient reports height of 6'3".  Patient initially needs mod assist to transition to standing, insufficient forward lean and heavy reliance on Ue's.  Worked on postural control, and forward weight shift to stand from elevated bed.  Also worked for more controlled descent (stand to sit) and alignment of full extension in knees, hips, trunk in standing.  Patient reports lightheadedness after first stand - BP as indicated below in sitting.   Patient able to stand subsequently without light headed feeling - standing BP listed below.  Patient short of breath with activity this session, yet O2 remained in low to mid 90's throughout.   Patient assisted to don pants while seated edge of bed - very little LLE activity noted to assist with donning pants/socks.   Left up in wheelchair in slight recline, with safety belt in place and engaged, and call bell in reach.     Therapy Documentation Precautions:  Precautions Precautions:  Fall Precaution Comments: General debility; cancer Restrictions Weight Bearing Restrictions: Yes LLE Weight Bearing: Weight bearing as tolerated    Vital Signs: Therapy Vitals Pulse Rate: 112 BP: 110/70 seated BP:  122/92 standing Pain:  5/10 neck and shoulders    Therapy/Group: Individual Therapy  Collier Salina 11/25/2022, 12:42 PM

## 2022-11-25 NOTE — Progress Notes (Signed)
PROGRESS NOTE   Subjective/Complaints: Discussed that SLP over weekend noted he was asking about restarting his lexapro and about an appetite stimulant, discussed that he is currently on mirtazepine and we can increase this dose  ROS: + Nausea/vomitting - fluctuating + Edema - improved + SOB - improved +cough  Objective:   DG Abd 1 View  Result Date: 11/24/2022 CLINICAL DATA:  Vomiting. EXAM: ABDOMEN - 1 VIEW COMPARISON:  11/14/2022 FINDINGS: The bowel gas pattern appears normal. No dilated loops of small or large bowel. Gas and stool noted in the within the colon. No abnormal abdominal or pelvic calcifications. Degenerative changes noted within both hips. IMPRESSION: Nonobstructive bowel gas pattern. Electronically Signed   By: Signa Kell M.D.   On: 11/24/2022 16:22   DG Chest Port 1 View  Result Date: 11/23/2022 CLINICAL DATA:  Shortness of breath EXAM: PORTABLE CHEST 1 VIEW COMPARISON:  Chest x-ray 11/14/2022 FINDINGS: Aortic graft again noted. Loop recorder device overlies the left chest. The cardiomediastinal silhouette is stable. There is no focal lung consolidation, pleural effusion or pneumothorax. There is minimal left base atelectasis. No acute fractures are seen. IMPRESSION: Minimal left base atelectasis. Otherwise no evidence of acute cardiopulmonary process. Electronically Signed   By: Darliss Cheney M.D.   On: 11/23/2022 16:25   Recent Labs    11/25/22 0722  WBC 13.3*  HGB 12.3*  HCT 38.5*  PLT 412*    Recent Labs    11/23/22 0529 11/24/22 1102  NA 130* 132*  K 3.8 3.6  CL 102 100  CO2 19* 19*  GLUCOSE 98 105*  BUN 19 13  CREATININE 1.13 1.16  CALCIUM 8.0* 8.4*    Intake/Output Summary (Last 24 hours) at 11/25/2022 1146 Last data filed at 11/25/2022 0846 Gross per 24 hour  Intake 118 ml  Output 600 ml  Net -482 ml        Physical Exam: Vital Signs Blood pressure 118/66, pulse 95,  temperature 98.9 F (37.2 C), temperature source Oral, resp. rate 20, height 6\' 3"  (1.905 m), weight 91.8 kg, SpO2 94%. Gen: no distress, normal appearing, BMI 25.30 HEENT: oral mucosa pink and moist, NCAT Cardio: RRR. No m/r/g. BL LE EDEMA -1+ pitting, improved; Chest: normal effort, normal rate of breathing Abd: soft, non-distended.  Nontender to palpation.  Skin:    General: Skin is warm and dry.     Comments: Infiltrated IV that's been removed in L AC fossa- large pore opening appearance- maybe ~ 3-4 mm opening- with associate slough- appears  improved R forearm IV - ok  Neurological:     Comments: AA, Oriented x 3 and follows commands. Mild cognitive delay, fatigue. Appropriate. Moving all 4 limbs antigravity and against resistance Intact to light touch in all  4 extremities   Psychiatric:     Comments: Very flat affect, slightly depressed.  No SI, but concerned regarding prognosis, being a burden on his family.   Musculoskeletal:  Prior exams RUE- 5/5 in biceps,triceps, grip and FA LUE- slightly weaker- 5-/5 in same muscles RLE- HF 4/5; KE/KF 4+/5; DF/PF 5-/5 LLE-  HF 3-/5; KE 4/5; DF/PF 4+/5  Cervical myofascial tightness  Assessment/Plan: 1. Functional  deficits which require 3+ hours per day of interdisciplinary therapy in a comprehensive inpatient rehab setting. Physiatrist is providing close team supervision and 24 hour management of active medical problems listed below. Physiatrist and rehab team continue to assess barriers to discharge/monitor patient progress toward functional and medical goals  Care Tool:  Bathing    Body parts bathed by patient: Right arm, Abdomen, Chest, Left arm, Front perineal area, Buttocks, Right upper leg, Left upper leg, Face   Body parts bathed by helper: Right lower leg, Left lower leg     Bathing assist Assist Level: Minimal Assistance - Patient > 75%     Upper Body Dressing/Undressing Upper body dressing   What is the patient  wearing?: Pull over shirt    Upper body assist Assist Level: Supervision/Verbal cueing    Lower Body Dressing/Undressing Lower body dressing      What is the patient wearing?: Pants, Underwear/pull up     Lower body assist Assist for lower body dressing: Minimal Assistance - Patient > 75%     Toileting Toileting    Toileting assist Assist for toileting: Supervision/Verbal cueing     Transfers Chair/bed transfer  Transfers assist     Chair/bed transfer assist level: Minimal Assistance - Patient > 75%     Locomotion Ambulation   Ambulation assist      Assist level: Minimal Assistance - Patient > 75% Assistive device: Walker-rolling Max distance: 81ft   Walk 10 feet activity   Assist     Assist level: Minimal Assistance - Patient > 75% Assistive device: Walker-rolling   Walk 50 feet activity   Assist    Assist level: Minimal Assistance - Patient > 75% Assistive device: Walker-rolling    Walk 150 feet activity   Assist Walk 150 feet activity did not occur: Safety/medical concerns (fatigue)         Walk 10 feet on uneven surface  activity   Assist Walk 10 feet on uneven surfaces activity did not occur: Safety/medical concerns         Wheelchair     Assist Is the patient using a wheelchair?: Yes Type of Wheelchair: Manual    Wheelchair assist level: Dependent - Patient 0%      Wheelchair 50 feet with 2 turns activity    Assist        Assist Level: Dependent - Patient 0%   Wheelchair 150 feet activity     Assist      Assist Level: Dependent - Patient 0%   Blood pressure 118/66, pulse 95, temperature 98.9 F (37.2 C), temperature source Oral, resp. rate 20, height 6\' 3"  (1.905 m), weight 91.8 kg, SpO2 94%.  Medical Problem List and Plan: 1. 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  Defer to primary team.  2.  Antithrombotics: -DVT/anticoagulation:  Pharmaceutical: Heparin             -  antiplatelet therapy: Aspirin 81 mg daily 3. Neck and back pain: kpad ordered, continue massage with OT  4. Mood/Behavior/Sleep: Provide emotional support             -antipsychotic agents: N/A  5. Neuropsych/cognition: This patient is capable of making decisions on his own behalf.  6. Skin/Wound Care: Routine skin checks 7. Fluids/Electrolytes/Nutrition: Routine in and outs with follow-up chemistries 8.  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  Follow-up outpatient Dr. Mosetta Putt.  Currently not a candidate for more cancer treatment 9.  History of prostate cancer with prostatectomy.  Follow-up outpatient- pt has had leakage for 20 years since surgery done- wears briefs 10.  AKI.  Follow-up chemistries.  -BUN and creatinine stable off of IV fluids 7/20 to 7/21 11.  Hypertension.  Well controlled, decrease Norvasc to 5 mg daily.,  Toprol-XL 12.5 mg daily    11/25/2022    8:51 AM 11/25/2022    6:45 AM 11/24/2022   10:40 PM  Vitals with BMI  Systolic 118 114 914  Diastolic 66 84 79  Pulse 95 96 102  -Blood pressure well-controlled, monitor  12.  Hyperlipidemia.  Lipitor 13.  History of tobacco use.  Quit smoking 3 months ago.  Provide counseling 14. DNR- made DNR and plans for outpt with hospice when discharged from CIR. Discussed with patient and he expressed wishes to remain DNR, there was some conflicting information in prior palliative care note, so  have consulted palliative care to redicuss goals of care with patient  15. Chronic nausea- on oral meds- helpful, but not resolved 16. Cough- notes better with new cough meds. Will monitor for any worsening of Sx's.   - 7/20: CXR mild bibasilar atelectasis; otherwise clear. ISB.  17. Hyponatremia: messaged SLP to see if we can advance him to a regular diet, repeat BMP reviewed and Na increased to 126, discussed with patient, salt tabs added, consulted medicine for additional recs, repeat BMP tomorrow   - 7/20: Na improved to 130, continue maintenance IVF d.t tachycardia and monitorring  18. Decreased appetite/nasue/vomitting: increase mirtazepine to 15mg    - 7/21: N/V, loss of appetite today; labs with mild increase/stable ALP and AST, otherwise unchanged.  Start Drabinol 2.5 mg AC BID.  Already on scopolamine patch, as needed IV Zofran.  19. Insomnia: increase mirtazepine to 15mg  HS  20. Leukocytosis: temperature reviewed and afebrile, repeat tomorrow to trend  21. Cancer-related fatigue/anasarca: discussed that this can be exacerbated by the Mirtazepine, but will continue this medication since it helped him sleep last night and will stimulate his appetite -start modafinil 100mg  daily today -dietician note reviewed, Boost ordered  - 7/20: Discussed need for increased protein intake d/t third spacing IVF; CXR as abbove; TEDs for LE edema. Daily weights for retention.  Filed Weights   11/20/2022 1458 11/24/22 0642  Weight: 90.7 kg 91.8 kg   - 7/21: Edema improved with use of teds, monitor   22. AKI: Cr improved s/p fluids  23. Cough at night: patient states improved when he can sleep, increase mirtazepine as above  >50 minutes spent in discussion of his decreased appetite, depression, increasing dose of mirtazepine at night, his cough, that when he is able to sleep he no longer coughs, reviewing labs and BP and reducing amlodipine    LOS: 6 days A FACE TO FACE EVALUATION WAS  PERFORMED  Drema Pry Tionne Carelli 11/25/2022, 11:46 AM

## 2022-11-25 NOTE — Plan of Care (Signed)
  Problem: RH Expression Communication Goal: LTG Patient will increase speech intelligibility (SLP) Description: LTG: Patient will increase speech intelligibility at word/phrase/conversation level with cues, % of the time (SLP) Flowsheets (Taken 11/25/2022 1513) LTG: Patient will increase speech intelligibility (SLP): Supervision Level: Conversation level Percent of time patient will use intelligible speech: 80

## 2022-11-25 NOTE — Progress Notes (Signed)
Speech Language Pathology Daily Session Note  Patient Details  Name: Mathew [name] MRN: [id number] Date of Birth: 11/12/1950  Today's Date: 11/25/2022 SLP Individual Time: 1300-1400 SLP Individual Time Calculation (min): 60 min  Short Term Goals: Week 1: SLP Short Term Goal 1 (Week 1): Patient will demonstrate problem solving abilities in complex daily situations with supervision A SLP Short Term Goal 2 (Week 1): Patient will utilize memory compensatory aids with supervision A SLP Short Term Goal 3 (Week 1): Patient will recall memory compensatory aids with supervision A  Skilled Therapeutic Interventions: Skilled therapy session focused on cognitive re-training. SLP facilitated session through providing mod verbal cues during the completion of calendar task. Patient utilized list of events to accurately complete calendar and answer comprehension questions with mod A. Increased cueing needed this date as patient often tangential and answered questions based on his own schedule. SLP to reorient patient to task several times throughout the session. Noted decreased speech intelligibility this date, therefore SLP provided patient with speech intelligibility strategies. Patient able to recall strategies after a 10 minute delay. SLP provided patient with a wall calendar to assist in orientation to time. Patient left in bed with alarm set and call bell in reach. Continue POC.   Pain Denies Pain  Therapy/Group: Individual Therapy  [name] M.A., CF-SLP 11/25/2022, 3:03 PM

## 2022-11-26 ENCOUNTER — Inpatient Hospital Stay (HOSPITAL_COMMUNITY): Payer: BC Managed Care – PPO

## 2022-11-26 DIAGNOSIS — I71019 Dissection of thoracic aorta, unspecified: Secondary | ICD-10-CM

## 2022-11-26 DIAGNOSIS — C220 Liver cell carcinoma: Secondary | ICD-10-CM

## 2022-11-26 DIAGNOSIS — E8809 Other disorders of plasma-protein metabolism, not elsewhere classified: Secondary | ICD-10-CM

## 2022-11-26 DIAGNOSIS — Z7189 Other specified counseling: Secondary | ICD-10-CM

## 2022-11-26 MED ORDER — DRONABINOL 2.5 MG PO CAPS
2.5000 mg | ORAL_CAPSULE | Freq: Every day | ORAL | Status: DC
  Administered 2022-11-26: 2.5 mg via ORAL
  Filled 2022-11-26: qty 1

## 2022-11-26 MED ORDER — MIRTAZAPINE 15 MG PO TABS
7.5000 mg | ORAL_TABLET | Freq: Every day | ORAL | Status: DC
  Administered 2022-11-26 – 2022-11-28 (×3): 7.5 mg via ORAL
  Filled 2022-11-26 (×3): qty 1

## 2022-11-26 MED ORDER — SODIUM CHLORIDE 0.9 % IV SOLN: INTRAVENOUS | Status: DC

## 2022-11-26 NOTE — Progress Notes (Signed)
Speech Language Pathology Daily Session Note  Patient Details  Name: Mathew [name] MRN: [id number] Date of Birth: [birth year]  Today's Date: 11/26/2022 SLP Individual Time: 1001-1101 SLP Individual Time Calculation (min): 60 min  Short Term Goals: Week 1: SLP Short Term Goal 1 (Week 1): Patient will demonstrate problem solving abilities in complex daily situations with supervision A SLP Short Term Goal 2 (Week 1): Patient will utilize memory compensatory aids with supervision A SLP Short Term Goal 3 (Week 1): Patient will recall memory compensatory aids with supervision A  Skilled Therapeutic Interventions: Skilled therapy session focused on problem solving and speech intelligibility goals. Prior to session, care team/family with reports of increased slurred speech this date. CT scan ordered for this afternoon. SLP facilitated session by providing min A during money counting tasks. Patient able to count physical change with 80% accuracy and supervision A, however required min A during monetary calculations on paper. Patient able to independently recall 3/4 memory strategies this date. To address speech intelligibility, SLP reviewed strategies and provided supervision A to utilize them during conversations with therapist, wife and NP. Speech intelligibility increased to 90% with use of strategies at the phrase level. Patient left in bed with alarm set and wife present. Call bell in reach. Continue POC.    Pain Denies Pain  Therapy/Group: Individual Therapy  Trisa Cranor M.A., CF-SLP 11/26/2022, 11:42 AM

## 2022-11-26 NOTE — Progress Notes (Signed)
Daily Progress Note   Patient Name: Mathew [name]       Date: 11/26/2022 DOB: March 24, 1951  Age: 72 y.o. MRN#: [id number] Attending Physician: Horton Chin, MD Primary Care Physician: Patient, No Pcp Per Admit Date: 11/06/2022  Reason for Consultation/Follow-up: Establishing goals of care  Patient Profile/HPI: 72 y.o. male with past medical history of stage IVa hepatocellular cancer (more than one tumor, no distant metastasis) was on chemotherapy prior to admission, a-fib, HTN, Hep C post tx, admitted on 10/30/2022 with aortic rupture requiring endovascular repair by vascular surgery. His recovery has been complicated by L sided weakness and MRI brain confirmed multiple embolic infarcts, and MRI C-spine showed R side cord ischemia. Palliative consulted for advanced care planning.   Subjective: Chart reviewed including labs, progress notes, imaging from this and previous encounters.  Patient participating in therapy prior to my visit. His spouse is at bedside.  Has complaints of his leg swelling. We discussed his low albumin, how it relates to diseases of the liver and how it contributes to edema in the body. He wishes he had more appetite. His nausea is improved. He is on mirtazapine, and has been started on Marinol. We discussed small frequent  high protein meals. He and spouse continue to express GOC to maximize his time in therapy and then go home with hospice. Mathew [name] expresses his awareness that his condition is incurable. They also are aware that he will decline once home and as his cancer progresses.    Review of Systems  Constitutional:  Positive for malaise/fatigue and weight loss.  Cardiovascular:  Positive for leg swelling.  Gastrointestinal:  Positive for nausea.     Physical  Exam Vitals and nursing note reviewed.  Neurological:     Mental Status: He is alert.     Comments: Hypophonic, mumbles at times             Vital Signs: BP 126/74   Pulse 94   Temp (!) 97.4 F (36.3 C)   Resp (!) 23   Ht 6\' 3"  (1.905 m)   Wt 92.2 kg   SpO2 96%   BMI 25.41 kg/m  SpO2: SpO2: 96 % O2 Device: O2 Device: Room Air O2 Flow Rate:    Intake/output summary:  Intake/Output Summary (Last 24 hours) at 11/26/2022 1045 Last data  filed at 11/26/2022 0700 Gross per 24 hour  Intake 355 ml  Output --  Net 355 ml   LBM: Last BM Date : 11/23/22 Baseline Weight: Weight: 90.7 kg Most recent weight: Weight: 92.2 kg       Palliative Assessment/Data: PPS: 50%      Patient Active Problem List   Diagnosis Date Noted   Adjustment disorder with depressed mood 11/25/2022   Debility 11/25/2022   CVA (cerebral vascular accident) (HCC) 11/13/2022   Malnutrition of moderate degree 11/04/2022   Acute thoracic aortic dissection (HCC) 10/30/2022   Ascending aortic aneurysm, unspecified whether ruptured (HCC) 10/30/2022   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   Normal anion gap metabolic acidosis 10/30/2022   Malignant hypertension 10/28/2022   AKI (acute kidney injury) (HCC) 10/27/2022   Hypomagnesemia 10/27/2022   Metastatic malignant neoplasm (HCC) 10/26/2022   Dissection of descending thoracic aorta (HCC) 10/25/2022   Hepatocellular carcinoma (HCC) 09/03/2022   Liver mass 08/21/2022   Medication side effect 10/16/2021   IPF (idiopathic pulmonary fibrosis) (HCC) 08/17/2021   Smoker 08/17/2021    Palliative Care Assessment & Plan    Assessment/Recommendations/Plan  GOC remain to maximize CIR and then discharge home with hospice TOC order placed to coordinate hospice at home when discharged   Code Status: DNR  Prognosis:  < 3 months  Discharge Planning: Home with Hospice  Care plan was discussed with patient and his spouse.  Thank  you for allowing the Palliative Medicine Team to assist in the care of this patient.  Total time:  55 minutes  Greater than 50%  of this time was spent counseling and coordinating care related to the above assessment and plan.  Ocie Bob, AGNP-C Palliative Medicine   Please contact Palliative Medicine Team phone at [phone] for questions and concerns.

## 2022-11-26 NOTE — Progress Notes (Signed)
PROGRESS NOTE   Subjective/Complaints: Patient presents with worsening LLE weakness and dysarthria today, speech seemed to improve during our conversation, stat head CT ordered to r/o stroke  ROS: + Nausea/vomitting - fluctuating + Edema - improved + SOB - improved +cough +LLE weakness  Objective:   No results found. Recent Labs    11/25/22 0722  WBC 13.3*  HGB 12.3*  HCT 38.5*  PLT 412*    Recent Labs    11/24/22 1102  NA 132*  K 3.6  CL 100  CO2 19*  GLUCOSE 105*  BUN 13  CREATININE 1.16  CALCIUM 8.4*    Intake/Output Summary (Last 24 hours) at 11/26/2022 1311 Last data filed at 11/26/2022 0700 Gross per 24 hour  Intake 355 ml  Output --  Net 355 ml        Physical Exam: Vital Signs Blood pressure 126/74, pulse 94, temperature (!) 97.4 F (36.3 C), resp. rate (!) 23, height 6\' 3"  (1.905 m), weight 92.2 kg, SpO2 96%. Gen: no distress, normal appearing, BMI 25.41 HEENT: oral mucosa pink and moist, NCAT Cardio: RRR. No m/r/g. BL LE EDEMA -1+ pitting, improved; Chest: normal effort, normal rate of breathing Abd: soft, non-distended.  Nontender to palpation.  Skin:    General: Skin is warm and dry.     Comments: Infiltrated IV that's been removed in L AC fossa- large pore opening appearance- maybe ~ 3-4 mm opening- with associate slough- appears  improved R forearm IV - ok  Neurological:     Comments: AA, Oriented x 3 and follows commands. Mild cognitive delay, fatigue. Appropriate. Moving all 4 limbs antigravity and against resistance Intact to light touch in all  4 extremities, +dysarthria   Psychiatric:     Comments: Very flat affect, slightly depressed.  No SI, but concerned regarding prognosis, being a burden on his family.   Musculoskeletal:  Prior exams RUE- 5/5 in biceps,triceps, grip and FA LUE- slightly weaker- 5-/5 in same muscles RLE- HF 4/5; KE/KF 4+/5; DF/PF 5-/5 LLE-  HF  3-/5; KE 4/5; DF/PF 4+/5  Cervical myofascial tightness  Assessment/Plan: 1. Functional deficits which require 3+ hours per day of interdisciplinary therapy in a comprehensive inpatient rehab setting. Physiatrist is providing close team supervision and 24 hour management of active medical problems listed below. Physiatrist and rehab team continue to assess barriers to discharge/monitor patient progress toward functional and medical goals  Care Tool:  Bathing    Body parts bathed by patient: Right arm, Abdomen, Chest, Left arm, Front perineal area, Buttocks, Right upper leg, Left upper leg, Face   Body parts bathed by helper: Right lower leg, Left lower leg     Bathing assist Assist Level: Minimal Assistance - Patient > 75%     Upper Body Dressing/Undressing Upper body dressing   What is the patient wearing?: Pull over shirt    Upper body assist Assist Level: Supervision/Verbal cueing    Lower Body Dressing/Undressing Lower body dressing      What is the patient wearing?: Pants, Underwear/pull up     Lower body assist Assist for lower body dressing: Minimal Assistance - Patient > 75%     Toileting Toileting  Toileting assist Assist for toileting: Supervision/Verbal cueing     Transfers Chair/bed transfer  Transfers assist     Chair/bed transfer assist level: Minimal Assistance - Patient > 75%     Locomotion Ambulation   Ambulation assist      Assist level: Minimal Assistance - Patient > 75% Assistive device: Walker-rolling Max distance: 34ft   Walk 10 feet activity   Assist     Assist level: Minimal Assistance - Patient > 75% Assistive device: Walker-rolling   Walk 50 feet activity   Assist    Assist level: Minimal Assistance - Patient > 75% Assistive device: Walker-rolling    Walk 150 feet activity   Assist Walk 150 feet activity did not occur: Safety/medical concerns (fatigue)         Walk 10 feet on uneven surface   activity   Assist Walk 10 feet on uneven surfaces activity did not occur: Safety/medical concerns         Wheelchair     Assist Is the patient using a wheelchair?: Yes Type of Wheelchair: Manual    Wheelchair assist level: Dependent - Patient 0%      Wheelchair 50 feet with 2 turns activity    Assist        Assist Level: Dependent - Patient 0%   Wheelchair 150 feet activity     Assist      Assist Level: Dependent - Patient 0%   Blood pressure 126/74, pulse 94, temperature (!) 97.4 F (36.3 C), resp. rate (!) 23, height 6\' 3"  (1.905 m), weight 92.2 kg, SpO2 96%.  Medical Problem List and Plan: 1. 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  Reconsulted palliative care  Head CT ordered given worsening LLE weakness and dysarthria   2.  Antithrombotics: -DVT/anticoagulation:  Pharmaceutical: Heparin             -antiplatelet therapy: Aspirin 81 mg daily 3. Neck and back pain: kpad ordered, continue massage with OT, d/c percocet as wife feels this makes him confused  4. Mood/Behavior/Sleep: Provide emotional support             -antipsychotic agents: N/A  5. Neuropsych/cognition: This patient is capable of making decisions on his own behalf.  6. Skin/Wound Care: Routine skin checks 7. Fluids/Electrolytes/Nutrition: Routine in and outs with follow-up chemistries 8.  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  Follow-up outpatient Dr. Mosetta Putt.  Currently not a  candidate for more cancer treatment 9.  History of prostate cancer with prostatectomy.  Follow-up outpatient- pt has had leakage for 20 years since surgery done- wears briefs 10.  AKI.  Follow-up chemistries.  -BUN and creatinine stable off of IV fluids 7/20 to 7/21 11.  Hypertension.  Well controlled, decrease Norvasc to 5 mg daily.,  Toprol-XL 12.5 mg daily    11/26/2022    9:55 AM 11/26/2022    9:50 AM 11/26/2022    8:12 AM  Vitals  with BMI  Systolic  126 121  Diastolic  74 75  Pulse 94  88  -Blood pressure well-controlled, monitor  12.  Hyperlipidemia.  Lipitor 13.  History of tobacco use.  Quit smoking 3 months ago.  Provide counseling 14. DNR- made DNR and plans for outpt with hospice when discharged from CIR. Discussed with patient and he expressed wishes to remain DNR, there was some conflicting information in prior palliative care note, so have consulted palliative care to redicuss goals of care with patient  15. Chronic nausea- on oral meds- helpful, but not resolved 16. Cough- notes better with new cough meds. Will monitor for any worsening of Sx's.   - 7/20: CXR mild bibasilar atelectasis; otherwise clear. ISB.  17. Hyponatremia: messaged SLP to see if we can advance him to a regular diet, repeat BMP reviewed and Na increased to 126, discussed with patient, salt tabs added, consulted medicine for additional recs, repeat BMP tomorrow   - 7/20: Na improved to 130, continue maintenance IVF d.t tachycardia and monitorring  18. Decreased appetite/nasue/vomitting: increase mirtazepine to 15mg    - 7/21: N/V, loss of appetite today; labs with mild increase/stable ALP and AST, otherwise unchanged.  Start Drabinol 2.5 mg AC BID.  Already on scopolamine patch, as needed IV Zofran.  19. Insomnia: decrease mirtazepine back to 7.5mg  HS given confusion with higher dose  20. Leukocytosis: temperature reviewed and afebrile, repeat tomorrow to trend  21. Cancer-related fatigue/anasarca:  discussed that this can be exacerbated by the Mirtazepine, but will continue this medication since it helped him sleep last night and will stimulate his appetite -start modafinil 100mg  daily today -dietician note reviewed, Boost ordered  - 7/20: Discussed need for increased protein intake d/t third spacing IVF; CXR as abbove; TEDs for LE edema. Daily weights for retention.  Filed Weights   11/14/2022 1458 11/24/22 0642 11/26/22 0500  Weight: 90.7 kg 91.8 kg 92.2 kg   - 7/21: Edema improved with use of teds, monitor   22. AKI: Cr improved s/p fluids  23. Cough at night: patient states improved when he can sleep, increase mirtazepine as above  24. Impaired cognition: decrease Marinol to daily  >50 minutes spent in assessing patient, ordering Head CT to r/o stroke given increased LLE weakness and dysarthria, decreasing Marinol to daily, decreasing mirtazepine, and discontinuing percocet   LOS: 7 days A FACE TO FACE EVALUATION WAS PERFORMED  Micholas Drumwright P Tamber Burtch 11/26/2022, 1:11 PM

## 2022-11-26 NOTE — Progress Notes (Signed)
Occupational Therapy Weekly Progress Note  Patient Details  Name: Mathew [name] MRN: [id number] Date of Birth: 05-05-1951  Beginning of progress report period: November 20, 2022 End of progress report period: November 26, 2022  Today's Date: 11/26/2022 OT Individual Time: 1914-7829 OT Individual Time Calculation (min): 32 min  and Today's Date: 11/26/2022 OT Missed Time: 13 Minutes Missed Time Reason: Other (comment) (Missed minutes due to delay in patient care.)   Patient has not met STGs this reporting period due to fluctuating performance levels due to generalized pain, fatigue, and weakness. Pt most consistently requires Mod-Max A for all ADL participation. Pt recently presenting with increased difficulty managing LLE. Spouse present intermediately, caregiver education to be scheduled as appropriate.   Patient continues to demonstrate the following deficits: muscle weakness, decreased cardiorespiratoy endurance, and decreased standing balance, decreased postural control, and decreased balance strategies and therefore will continue to benefit from skilled OT intervention to enhance overall performance with BADL and Reduce care partner burden.  Patient not progressing toward long term goals.  See goal revision..  Plan of care revisions: 7/23.  OT Short Term Goals Week 1:  OT Short Term Goal 1 (Week 1): Patient will complete sit to stand from standard surface level with Min guard assist OT Short Term Goal 1 - Progress (Week 1): Not met OT Short Term Goal 2 (Week 1): Pt will complete LB dressing with Min A utilizing AE as needed. OT Short Term Goal 2 - Progress (Week 1): Not met OT Short Term Goal 3 (Week 1): Pt will completed toileting with Min A OT Short Term Goal 3 - Progress (Week 1): Not met Week 2:  OT Short Term Goal 1 (Week 2): STGs=LTGs due to patient's length of stay.  Skilled Therapeutic Interventions/Progress Updates:  Pt received resting in bed for skilled OT session with focus on  BADL participation. Pt initially presenting with increased drowsiness and gneralized pain, but agreeable to interventions, demonstrating overall pleasant mood. Pt with un-rated pain, grimacing with bed-level movements and functional transfers. OT offering intermediate rest breaks and positioning suggestions throughout session to address pain/fatigue and maximize participation/safety in session.  Pt receptive to brief change as he was found to be incontinent of urine. Increased urgency present once materials for brief change were retrieved. Transfer into bathroom deferred this session due to generalized pain/weakness, BSC brought into patient room, pt completing squat-pivot transfer from EOB<>BSC with CGA. Pt performs x2 "seated push-ups" to assist with removal of LB garments. Pt with successful void of bladder, unable to produce BM, see flowsheets. Pt returned to supine for care due to fatigue, Max A provided for pericare and donning brief, pt rolling R/L with cuing for technique.   Pt remained resting in bed with all immediate needs met at end of session. Pt continues to be appropriate for skilled OT intervention to promote further functional independence.   Therapy Documentation Precautions:  Precautions Precautions: Fall Precaution Comments: General debility; cancer Restrictions Weight Bearing Restrictions: Yes LLE Weight Bearing: Weight bearing as tolerated   Therapy/Group: Individual Therapy  Lou Cal, OTR/L, MSOT  11/26/2022, 6:16 AM

## 2022-11-26 NOTE — Progress Notes (Signed)
Occupational Therapy Session Note  Patient Details  Name: Mathew [name] MRN: [id number] Date of Birth: 23-Apr-1951  Today's Date: 11/26/2022 OT Individual Time: 0800-0900 OT Individual Time Calculation (min): 60 min    Short Term Goals: Week 1:  OT Short Term Goal 1 (Week 1): Patient will complete sit to stand from standard surface level with Min guard assist OT Short Term Goal 2 (Week 1): Pt will complete LB dressing with Min A utilizing AE as needed. OT Short Term Goal 3 (Week 1): Pt will completed toileting with Min A  Skilled Therapeutic Interventions/Progress Updates:  Upon OT arrival, nursing assessing pt for potential change of status due to increased dysarthria and somnolence this am. No acute s/s but MD notified and then present during OT session. Repeat head scan ordered but MD ok'd bed level OT. Secure chat to other therapy team for info sent. Pt able to perform bed level sponge bathing UB with min A with OT completing LB sponge bathing and peri hygiene. New clean incontinence brief applied with pt able to roll with min A. LB pull on pants with max A with facilitation of bridging and reaching. UB dressing with min A pull over shirt with cues for L UE hemi technique. Pt completed oral care with set up and occ min A for L UE coordination. Left bed level to rest with all safety measures and needs in reach.   Pain: mild un-rated L UE pain at wound site with no interference during session  Therapy Documentation Precautions:  Precautions Precautions: Fall Precaution Comments: General debility; cancer Restrictions Weight Bearing Restrictions: Yes LLE Weight Bearing: Weight bearing as tolerated    Therapy/Group: Individual Therapy  Vicenta Dunning 11/26/2022, 7:58 AM

## 2022-11-26 NOTE — Progress Notes (Signed)
Patient ID: Mathew [name], male   DOB: 07/01/1950, 72 y.o.   MRN: [id number]  Met with wife who is present and pt was in speech therapies to answer her questions regarding services available to them at discharge. Discussed if plan is home with home health versus home with hospice. Wife reports it is home with home health when discharged. Spoke about home health services and coverage for DME. She reports she will be home with him and can assist. Will have her do hands on training prior to discharge. Aware another team conference tomorrow will update her/both then.

## 2022-11-26 NOTE — Progress Notes (Signed)
Physical Therapy Session Note  Patient Details  Name: Mathew [name] MRN: [id number] Date of Birth: 1950/08/09  Today's Date: 11/26/2022 PT Individual Time: 1400-      Short Term Goals: Week 1:  PT Short Term Goal 1 (Week 1): Pt will complete bed mobility with CGA and no hospital bed features PT Short Term Goal 2 (Week 1): Pt will complete bed<>chair transfers with CGA and LRAD PT Short Term Goal 3 (Week 1): Pt will ambulate 15ft with CGA and LRAD PT Short Term Goal 4 (Week 1): Pt will navigate up/down x4 stairs with CGA and LRAD  Skilled Therapeutic Interventions/Progress Updates:    Chart reviewed. Pt greeted and found very lethargic, which was consistent with RN report just prior to session. Pt roused with increased time. Pt noted to have mumbled speech, but indicated willingness to participate. Pt able to speak clearly when prompted to repeat. PT educated pt that session would work on gentle therex given current review of pt's status. Pt very agreeable. PT began to set up room, and pt noted to fall asleep. Pt able to be roused, but continued to have unclear speech unless prompted for clarity, which pt could then speak clearly. PT attempted to initiate session two more times with same response. PT then informed RN of pt status. At end of session, pt was left semi-reclined in bed with alarm engaged, nurse call bell and all needs in reach.     Therapy Documentation Precautions:  Precautions Precautions: Fall Precaution Comments: General debility; cancer Restrictions Weight Bearing Restrictions: Yes LLE Weight Bearing: Weight bearing as tolerated General: PT Amount of Missed Time (min): 45 Minutes PT Missed Treatment Reason: Patient fatigue;Patient ill (Comment) (extremely lethargic)    Therapy/Group: Individual Therapy  Dionne Milo, PT, DPT 11/26/2022, 2:31 PM

## 2022-11-26 NOTE — Plan of Care (Signed)
  Problem: Consults Goal: RH STROKE PATIENT EDUCATION Description: See Patient Education module for education specifics  Outcome: Progressing   Problem: RH SAFETY Goal: RH STG ADHERE TO SAFETY PRECAUTIONS W/ASSISTANCE/DEVICE Description: STG Adhere to Safety Precautions With min Assistance/Device. Outcome: Progressing   Problem: RH KNOWLEDGE DEFICIT Goal: RH STG INCREASE KNOWLEDGE OF HYPERTENSION Description: Patient and wife will be able to manage care/HTN at discharge with medications and dietary modifications using educational resources independently Outcome: Progressing Goal: RH STG INCREASE KNOWLEDGE OF DYSPHAGIA/FLUID INTAKE Description: Patient and wife will be able to manage dysphagia/diet at discharge with medications and dietary modifications using educational resources independently Outcome: Progressing Goal: RH STG INCREASE KNOWLEGDE OF HYPERLIPIDEMIA Description: Patient and wife will be able to manage care/HLD at discharge with medications and dietary modifications using educational resources independently Outcome: Progressing Goal: RH STG INCREASE KNOWLEDGE OF STROKE PROPHYLAXIS Description: Patient and wife will be able to manage secondary risks at discharge with medications and dietary modifications using educational resources independently Outcome: Progressing   Problem: Education: Goal: Knowledge of discharge needs will improve Outcome: Progressing   Problem: Clinical Measurements: Goal: Postoperative complications will be avoided or minimized Outcome: Progressing   Problem: Respiratory: Goal: Ability to achieve and maintain a regular respiratory rate will improve Outcome: Progressing   Problem: Skin Integrity: Goal: Demonstration of wound healing without infection will improve Outcome: Progressing   Problem: Education: Goal: Knowledge of disease or condition will improve Outcome: Progressing Goal: Knowledge of secondary prevention will improve (MUST  DOCUMENT ALL) Outcome: Progressing Goal: Knowledge of patient specific risk factors will improve Loraine Leriche N/A or DELETE if not current risk factor) Outcome: Progressing   Problem: Ischemic Stroke/TIA Tissue Perfusion: Goal: Complications of ischemic stroke/TIA will be minimized Outcome: Progressing   Problem: Coping: Goal: Will verbalize positive feelings about self Outcome: Progressing Goal: Will identify appropriate support needs Outcome: Progressing   Problem: Health Behavior/Discharge Planning: Goal: Ability to manage health-related needs will improve Outcome: Progressing Goal: Goals will be collaboratively established with patient/family Outcome: Progressing   Problem: Self-Care: Goal: Ability to participate in self-care as condition permits will improve Outcome: Progressing Goal: Verbalization of feelings and concerns over difficulty with self-care will improve Outcome: Progressing Goal: Ability to communicate needs accurately will improve Outcome: Progressing   Problem: Nutrition: Goal: Risk of aspiration will decrease Outcome: Progressing Goal: Dietary intake will improve Outcome: Progressing

## 2022-11-26 NOTE — Plan of Care (Signed)
  Problem: RH Bathing Goal: LTG Patient will bathe all body parts with assist levels (OT) Description: LTG: Patient will bathe all body parts with assist levels (OT) Flowsheets (Taken 11/26/2022 2100) LTG: Pt will perform bathing with assistance level/cueing: Minimal Assistance - Patient > 75% Note: Goal downgraded 7/23 due to fluctuation of patient's performance as a result of current medical status.    Problem: RH Dressing Goal: LTG Patient will perform lower body dressing w/assist (OT) Description: LTG: Patient will perform lower body dressing with assist, with/without cues in positioning using equipment (OT) Flowsheets (Taken 11/26/2022 2100) LTG: Pt will perform lower body dressing with assistance level of: Minimal Assistance - Patient > 75% Note: Goal downgraded 7/23 due to fluctuation of patient's performance as a result of current medical status.    Problem: RH Toileting Goal: LTG Patient will perform toileting task (3/3 steps) with assistance level (OT) Description: LTG: Patient will perform toileting task (3/3 steps) with assistance level (OT)  Flowsheets (Taken 11/26/2022 2100) LTG: Pt will perform toileting task (3/3 steps) with assistance level: Minimal Assistance - Patient > 75% Note: Goal downgraded 7/23 due to fluctuation of patient's performance as a result of current medical status.    Problem: RH Toilet Transfers Goal: LTG Patient will perform toilet transfers w/assist (OT) Description: LTG: Patient will perform toilet transfers with assist, with/without cues using equipment (OT) Flowsheets (Taken 11/26/2022 2100) LTG: Pt will perform toilet transfers with assistance level of: Contact Guard/Touching assist Note: Goal downgraded 7/23 due to fluctuation of patient's performance as a result of current medical status.    Problem: RH Tub/Shower Transfers Goal: LTG Patient will perform tub/shower transfers w/assist (OT) Description: LTG: Patient will perform tub/shower  transfers with assist, with/without cues using equipment (OT) Flowsheets (Taken 11/26/2022 2100) LTG: Pt will perform tub/shower stall transfers with assistance level of: Contact Guard/Touching assist Note: Goal downgraded 7/23 due to fluctuation of patient's performance as a result of current medical status.

## 2022-11-27 ENCOUNTER — Inpatient Hospital Stay (HOSPITAL_COMMUNITY): Payer: BC Managed Care – PPO

## 2022-11-27 DIAGNOSIS — R5381 Other malaise: Secondary | ICD-10-CM | POA: Diagnosis not present

## 2022-11-27 DIAGNOSIS — M7989 Other specified soft tissue disorders: Secondary | ICD-10-CM

## 2022-11-27 LAB — BASIC METABOLIC PANEL
Anion gap: 8 (ref 5–15)
BUN: 11 mg/dL (ref 8–23)
CO2: 20 mmol/L — ABNORMAL LOW (ref 22–32)
Calcium: 8.2 mg/dL — ABNORMAL LOW (ref 8.9–10.3)
Chloride: 104 mmol/L (ref 98–111)
Creatinine, Ser: 0.9 mg/dL (ref 0.61–1.24)
GFR, Estimated: >60 mL/min (ref >60)
Glucose, Bld: 80 mg/dL (ref 70–99)
Potassium: 3.4 mmol/L — ABNORMAL LOW (ref 3.5–5.1)
Sodium: 132 mmol/L — ABNORMAL LOW (ref 135–145)

## 2022-11-27 MED ORDER — COLLAGENASE 250 UNIT/GM EX OINT
TOPICAL_OINTMENT | Freq: Every day | CUTANEOUS | Status: DC
  Filled 2022-11-27: qty 30

## 2022-11-27 NOTE — Progress Notes (Signed)
Occupational Therapy Session Note  Patient Details  Name: Mathew [name] MRN: [id number] Date of Birth: 1951/02/07  Today's Date: 11/27/2022 OT Individual Time: 4270-6237 and 1130-1153 OT Individual Time Calculation (min): 60 min and 23 min   Short Term Goals: Week 2:  OT Short Term Goal 1 (Week 2): STGs=LTGs due to patient's length of stay.  Skilled Therapeutic Interventions/Progress Updates:   First Session:  Patient received supine in bed.  Patient agreeable to OT session and wanting to shower.   Patient using R hand to move LLE to roll to edge of bed.  Once at edge of bed - patient reports strain on neck with position changes.  Patient with edematous L foot.  He indicates this is more pronounced than yesterday.  Communicated this with MD.   Patient completed squat pivot transfer to wheelchair with mod assist toward left.  Patient continues to benefit from cueing for forward lean.  Transferred to shower bench with min assist and use of grab bar.  Able to complete bathing seated, very reliant on UE support on grab bar in standing.  Patient transferred back to wheelchair and to room to complete hygiene and dressing at sink.  Patient needing increased assistance with LLE care due to limited active movement and edema.  Patient left up in wheelchair with personal items in reach.    Second Session:  Patient received seated in wheelchair, reclined with head support and neck pillow.  Patient reports he walked in his PT session but had to return to use the bathroom.  Patient agreeable to work on functional walking.  Turned armrests around to allow patient to push up from wheelchair.  Encouraged patient to scoot to edge of chair prior to standing to allow forward weight translation.  Following set up stood with cueing and close supervision.  Cueing for walker use - to not pick up rolling walker, and facilitation for alignment in standing (to reduce hip flexion.)  Practiced walking to door, then back to bed  as patient being taken down for MRI.  Patient assisted back to supine for direct hand off to transport tech.     Therapy Documentation Precautions:  Precautions Precautions: Fall Precaution Comments: General debility; cancer Restrictions Weight Bearing Restrictions: Yes LLE Weight Bearing: Weight bearing as tolerated   First Session:  Pain:  No pain  Second Session:   Pain:  No pain     Therapy/Group: Individual Therapy  Collier Salina 11/27/2022, 12:09 PM

## 2022-11-27 NOTE — Progress Notes (Signed)
Speech Language Pathology Weekly Progress and Session Note  Patient Details  Name: Mathew [name] MRN: [id number] Date of Birth: [birth year]  Beginning of progress report period: November 20, 2022 End of progress report period: November 27, 2022  Today's Date: 11/27/2022 SLP Individual Time: 1300-1400 SLP Individual Time Calculation (min): 60 min  Short Term Goals: Week 1: SLP Short Term Goal 1 (Week 1): Patient will demonstrate problem solving abilities in complex daily situations with supervision A SLP Short Term Goal 1 - Progress (Week 1): Not met SLP Short Term Goal 2 (Week 1): Patient will utilize memory compensatory aids with supervision A SLP Short Term Goal 2 - Progress (Week 1): Not met SLP Short Term Goal 3 (Week 1): Patient will recall memory compensatory aids with supervision A SLP Short Term Goal 3 - Progress (Week 1): Not met  New Short Term Goals: Week 2: SLP Short Term Goal 1 (Week 2): Patient will demonstrate problem solving abilities in mildly complex daily situations with minA SLP Short Term Goal 2 (Week 2): Patient will utilize memory compensatory aids with minA SLP Short Term Goal 3 (Week 2): Patient will recall memory compensatory aids with supervision A SLP Short Term Goal 4 (Week 2): Patient will utilize speech intelligibility increasing strategies (SLOP) to improve communication across contexts with intelligibility increasing to 90% at the phrase level given minA  Weekly Progress Updates: Patient exhibits difficulty meeting weekly goals 2* overall decline in cognitive status. Patient has met 0 out of 3 short term goals from week 1 with difficulty recalling and utilizing memory and speech intelligibility strategies. Patient continues to exhibit difficulty with functional problem solving tasks, requiring modA to provide adequate verbal solutions to presented problems at most recent session. Patient's current discharge plan is to hospice, thus SLP downgraded long term and short  term weekly goals to reflect needs post-discharge and realistic expectations for remainder of CIR stay. Additionally, SLP added speech intelligibility goal to target emergence of imprecise articulation and reduced breath support evident across past few sessions. Patient and family education ongoing. Patient would benefit from continued ST services targeting cognitive retraining and functional improvement of communication and cognitive ADLs.    Intensity: Minumum of 1-2 x/day, 30 to 90 minutes Frequency: 1 to 3 out of 7 days Duration/Length of Stay: 7/31 Treatment/Interventions: Cognitive remediation/compensation;Internal/external aids;Therapeutic Activities;Cueing hierarchy;Functional tasks;Patient/family education;Therapeutic Exercise  Daily Session  Skilled Therapeutic Interventions: SLP conducted skilled therapy session targeting speech intelligibility and cognitive retraining; SLP guided patient through activities to improve recall and utilization of speech intelligibility and memory strategies and functional problem solving. Patient is oriented to month and year but required reorientation to external memory aid calendar to orient to date. SLP informed patient of intended discharge date and indicated this date on wall calendar. SLP then tested patient on recall of previously targeted strategies. Patient recalled various targeted speech and memory strategies but was unclear on which strategies were used for speech vs. cognition. SLP provided patient with chart of all trained strategies broken down into these two categories for review. SLP then reviewed WRAP memory strategies and patient utilized repetition strategies with modA to recall 4/5 facts from brief paragraph after 5 minute distracted delay. During speech intelligibility task, patient required modA to maintain utilization of SLOP strategies. During functional problem solving task, patient required modA to provide adequate resolutions to presented  problems. Patient was left in lowered bed with call bell in reach and bed alarm set. SLP will continue to target goals per plan of care.  Pain Pain Assessment Pain Scale: 0-10 Pain Score: 0-No pain  Therapy/Group: Individual Therapy  Jeannie Done, M.A., CCC-SLP  Yetta Barre 11/27/2022, 3:55 PM

## 2022-11-27 NOTE — Progress Notes (Signed)
Left lower extremity venous study completed.   Preliminary results relayed to MD.  Please see CV Procedures for preliminary results.  Melah Ebling, RVT  2:13 PM 11/27/22

## 2022-11-27 NOTE — Progress Notes (Signed)
Physical Therapy Weekly Progress Note  Patient Details  Name: Jahred Tatar MRN: [id number] Date of Birth: [birth year]  Beginning of progress report period: November 20, 2022 End of progress report period: November 27, 2022  Today's Date: 11/27/2022 PT Individual Time: 0930-1025 PT Individual Time Calculation (min): 55 min   Patient has met 1 of 4 short term goals.  Pt achieved goal of amb of 119ft with CGA + RW. Pt partially med transfer goals with pt at [facility] level for bed mobility and bed<>chair transfer with noted support needed for LLE management. Pt did not meet stair goal as increased support still required.   Pt experienced slight decline in status this week, but is demonstrating increased mobility today and is likely to achieve CGA assist goals for bed mobility and bed<>chair transfers in the next week. Pt noted to have barriers for home entrance and interior access as pt has 2 steps to enter the home with report indicating no rails. Pt also expresses bed/beth on second floor, requiring pt to navigate a full flight of steps. Due to current status and prognosis, pt and family may need to consider alternative plans for home access including rails and ramp entrance, and DME that allows home set up for pt to complete bed/bath needs on first floor.   Patient continues to demonstrate the following deficits muscle weakness, decreased standing balance, decreased balance strategies, and decreased safety with DME and therefore will continue to benefit from skilled PT intervention to increase functional independence with mobility.  Patient progressing toward long term goals..  Continue plan of care.  PT Short Term Goals Week 1:  PT Short Term Goal 1 (Week 1): Pt will complete bed mobility with CGA and no hospital bed features PT Short Term Goal 1 - Progress (Week 1): Progressing toward goal PT Short Term Goal 2 (Week 1): Pt will complete bed<>chair transfers with CGA and LRAD PT Short Term Goal 2 -  Progress (Week 1): Progressing toward goal PT Short Term Goal 3 (Week 1): Pt will ambulate 156ft with CGA and LRAD PT Short Term Goal 3 - Progress (Week 1): Met PT Short Term Goal 4 (Week 1): Pt will navigate up/down x4 stairs with CGA and LRAD PT Short Term Goal 4 - Progress (Week 1): Not met   Week 2:  PT Short Term Goal 1 (Week 2): Pt will complete bed mobility with CGA PT Short Term Goal 2 (Week 2): Pt will complete bed <>chair transfer with CGA + LRAD PT Short Term Goal 3 (Week 2): Pt will amb 177ft amb with CGA + LRAD PT Short Term Goal 4 (Week 2): Pt will navigate 2 steps with ModA +  rails per home set up  Skilled Therapeutic Interventions/Progress Updates:  Ambulation/gait training;Discharge planning;Functional mobility training;Psychosocial support;Visual/perceptual remediation/compensation;Wheelchair propulsion/positioning;Therapeutic Exercise;Balance/vestibular training;Disease management/prevention;Neuromuscular re-education;Skin care/wound management;Cognitive remediation/compensation;DME/adaptive equipment instruction;Pain management;Splinting/orthotics;UE/LE Strength taining/ROM;Community reintegration;Functional electrical stimulation;Patient/family education;UE/LE Runner, broadcasting/film/video;Therapeutic Activities     Therapy Documentation Precautions:  Precautions Precautions: Fall Precaution Comments: General debility; cancer Restrictions Weight Bearing Restrictions: Yes LLE Weight Bearing: Weight bearing as tolerated       Therapy/Group: Individual Therapy  Dionne Milo 11/27/2022, 11:56 AM

## 2022-11-27 NOTE — Progress Notes (Signed)
Physical Therapy Session Note  Patient Details  Name: Mathew [name] MRN: [id number] Date of Birth: August 27, 1950  Today's Date: 11/27/2022 PT Individual Time: 0930-1025 PT Individual Time Calculation (min): 55 min   Short Term Goals: Week 1:  PT Short Term Goal 1 (Week 1): Pt will complete bed mobility with CGA and no hospital bed features PT Short Term Goal 1 - Progress (Week 1): Progressing toward goal PT Short Term Goal 2 (Week 1): Pt will complete bed<>chair transfers with CGA and LRAD PT Short Term Goal 2 - Progress (Week 1): Progressing toward goal PT Short Term Goal 3 (Week 1): Pt will ambulate 134ft with CGA and LRAD PT Short Term Goal 3 - Progress (Week 1): Met PT Short Term Goal 4 (Week 1): Pt will navigate up/down x4 stairs with CGA and LRAD PT Short Term Goal 4 - Progress (Week 1): Not met  Skilled Therapeutic Interventions/Progress Updates:  Chart reviewed and pt agreeable to therapy. Pt received seated in WC with no c/o pain. Session focused on amb quality and functional transfers to promote safe home access. Pt taken to hallway for time management. In hallway, pt stood with CGA + RW and compelted marches in place with CGA Pt then requested toileting and then returned to the room. Pt amb to toileting using CGA + RW and required CGA for pericare. Pt then completed blocked practice of amb of 122ft using CGA + RW + VC for safety with RW during turns. Pt then returned to room and discussed home set up plan with PT. Pt stated 2 steps into house do not have rails and that pt intends to sleep on second floor. PT clarified that care team will discussed plan for stairs with pt and family in case ascending interior flight of steps in challenge at time of d/c.At end of session, pt was left seated in [facility] with alarm engaged, nurse call bell and all needs in reach.     Therapy Documentation Precautions:  Precautions Precautions: Fall Precaution Comments: General debility;  cancer Restrictions Weight Bearing Restrictions: Yes LLE Weight Bearing: Weight bearing as tolerated General:    Therapy/Group: Individual Therapy  Dionne Milo 11/27/2022, 11:56 AM

## 2022-11-27 NOTE — Plan of Care (Signed)
Goals downgraded due to change in status and potential d/c to Hospice   Problem: RH Problem Solving Goal: LTG Patient will demonstrate problem solving for (SLP) Description: LTG:  Patient will demonstrate problem solving for basic/complex daily situations with cues  (SLP) Flowsheets (Taken 11/27/2022 1554) LTG: Patient will demonstrate problem solving for (SLP): (mildly complex) Other (comment) LTG Patient will demonstrate problem solving for: Minimal Assistance - Patient > 75%   Problem: RH Memory Goal: LTG Patient will use memory compensatory aids to (SLP) Description: LTG:  Patient will use memory compensatory aids to recall biographical/new, daily complex information with cues (SLP) Flowsheets (Taken 11/27/2022 1554) LTG: Patient will use memory compensatory aids to (SLP): Minimal Assistance - Patient > 75%   Problem: RH Expression Communication Goal: LTG Patient will increase speech intelligibility (SLP) Description: LTG: Patient will increase speech intelligibility at word/phrase/conversation level with cues, % of the time (SLP) Flowsheets (Taken 11/27/2022 1554) LTG: Patient will increase speech intelligibility (SLP): Minimal Assistance - Patient > 75% Level: Conversation level Percent of time patient will use intelligible speech: 80

## 2022-11-27 NOTE — Patient Care Conference (Signed)
Inpatient RehabilitationTeam Conference and Plan of Care Update Date: 11/27/2022   Time: 11:10 AM    Patient Name: Mathew [name]      Medical Record Number: [id number]  Date of Birth: [birth year] Sex: Male         Room/Bed: 4W20C/4W20C-01 Payor Info: Payor: BLUE [employer] BLUE [employer]IELD / Plan: [employer]BS COMM PPO / Product Type: *No Product type* /    Admit Date/Time:  11/28/2022  2:55 PM  Primary Diagnosis:  CVA (cerebral vascular accident) [facility])  Hospital Problems: Principal Problem:   CVA (cerebral vascular accident) (HCC) Active Problems:   Acute thoracic aortic dissection (HCC)   Debility   Adjustment disorder with depressed mood    Expected Discharge Date: Expected Discharge Date: 12/04/22 (D/C date extended)  Team Members Present: Physician leading conference: Dr. Sula Soda Social Worker Present: Dossie Der, LCSW Nurse Present: Chana Bode, RN PT Present: Casimiro Needle, PT OT Present: Bretta Bang, OT SLP Present: Everardo Pacific, SLP PPS Coordinator present : Fae Pippin, SLP     Current Status/Progress Goal Weekly Team Focus  Bowel/Bladder   cont of B&B   remain cont   assist with toileting as needed    Swallow/Nutrition/ Hydration               ADL's   Max assist lower body, mod/min assist upper body   Recently downgraded goals to overall min assist   Monitor medical and functional status, family education, discharge planning and resources post discharge    Mobility   Pt amb 183ft with CGA + RW   S level amb of 1101ft + MinA for 2 steps using rails per home set up  increase amb distance and independence, steps, family education    Communication   noted decreased intelligibility 7/22 - goal added   supervision A   recall and utilize speech intelligibility strategies w/ supervision A    Safety/Cognition/ Behavioral Observations  min-supervision A in memory/problem solving   mod I   utilization and use of memory strategies, complex daily problem  solving situations    Pain   no pain reported   remain pain free   assess pain qshift and prn    Skin   Left AC and R groin wound- dressings in place   prevent infection  assess skin qshift and prn      Discharge Planning:  Home with wife who can provide assist. Deciding between home with home health versus home with hospice. Discussed both with, aware pt is incurable and will decline at home. Will need family education with wife prior to DC home.   Team Discussion: Patient with sudden decline noted on 11/26/22; CT scan WNL = MRI as thought to be medication related but no change since medications adjusted.   Patient on target to meet rehab goals: Currently limited by dysarthria, bilateral LE edema and weakness with memory issues, problem solving deficits. Currently needs min assist for bed mobility and CGA for ambulate with limited Left LE movement.  Needs min assist for cognition and working on utilization of strategies for memory and problem solving.   *See Care Plan and progress notes for long and short-term goals.   Revisions to Treatment Plan:  Palliative care re-consulted WOC consult for left antecubital wound Downgraded goals   Teaching Needs: Safety, medications, dietary modification, transfers, toileting, etc.   Current Barriers to Discharge: Decreased caregiver support and Home enviroment access/layout  Possible Resolutions to Barriers: Family education Decision between home with hospice (RN/Aide) or  HH services (skilled) SW to confirm goals of care Referral to [facility] for follow up services     Medical Summary Current Status: overweight, lung cancer, tachypnea, decreased appetite, confusion, left lower extremity weakness  Barriers to Discharge: Medical stability  Barriers to Discharge Comments: overweight, lung cancer, tachypnea, decreased appetite, confusion, left lower extremity weakness Possible Resolutions to [employer] Focus: provide dietary  education, vascular ultrasound ordered  palliative care ordered, continue edema garment, decrease mirtazepine to 7.5mg  HS, d/c marinol, d/c hydrocodone, conitnue to monitor RR   Continued Need for Acute Rehabilitation Level of Care: The patient requires daily medical management by a physician with specialized training in physical medicine and rehabilitation for the following reasons: Direction of a multidisciplinary physical rehabilitation program to maximize functional independence : Yes Medical management of patient stability for increased activity during participation in an intensive rehabilitation regime.: Yes Analysis of laboratory values and/or radiology reports with any subsequent need for medication adjustment and/or medical intervention. : Yes   I attest that I was present, lead the team conference, and concur with the assessment and plan of the team.   Chana Bode B 11/27/2022, 2:51 PM

## 2022-11-27 NOTE — Consult Note (Addendum)
WOC Nurse Consult Note: this consult performed remotely after review of EMR including photo and secure chat with bedside nurse; appreciate assistance from C. Lady Gary; patient has been receiving wound care with [facility] since 11/20/2022  Reason for Consult: wound L antecubital fossa  Wound type: full thickness reportedly from IV infiltrate  Pressure Injury POA: NA  Measurement: see nursing flowsheet  Wound bed: 100% yellow slough  Drainage (amount, consistency, odor) minimal purulent per nursing flowsheet  Periwound: intact  Dressing procedure/placement/frequency: Clean L antecubital fossa wound with NS, apply Santyl to wound bed daily apply 1/4" thick layer of Santyl to wound bed, top with small piece of saline moist gauze making sure to stay away from intact skin. May apply moisture barrier cream around wound to prevent maceration of surrounding skin if necessary.  Top with silicone foam.  Ok to lift foam daily for change of gauze and reapplication of Santyl. Ok to change silicone foam every 3 days.    POC discussed with bedside nurse. WOC team will not follow at this time. Re-consult if further needs arise.   Thank you,    Priscella Mann MSN, RN-BC, [employer] [phone]

## 2022-11-27 NOTE — Progress Notes (Addendum)
PROGRESS NOTE   Subjective/Complaints: Discussed that CT was stable Discussed with nursing that patient's mentation had improved by end of shift yesterday  ROS: + Nausea/vomitting - fluctuating + Edema - improved + SOB - improved +cough +LLE weakness Slurred speech improved  Objective:   CT HEAD WO CONTRAST ( )  Result Date: 11/26/2022 CLINICAL DATA:  Mental status change EXAM: CT HEAD WITHOUT CONTRAST TECHNIQUE: Contiguous axial images were obtained from the base of the skull through the vertex without intravenous contrast. RADIATION DOSE REDUCTION: This exam was performed according to the departmental dose-optimization program which includes automated exposure control, adjustment of the mA and/or kV according to patient size and/or use of iterative reconstruction technique. COMPARISON:  MRI brain 11/01/2022.  Head CT 11/01/2022. FINDINGS: Brain: No evidence of acute infarction, hemorrhage, hydrocephalus, extra-axial collection or mass lesion/mass effect. Vascular: No hyperdense vessel or unexpected calcification. Skull: Normal. Negative for fracture or focal lesion. Sinuses/Orbits: No acute finding. Other: None. IMPRESSION: No acute intracranial abnormality. Electronically Signed   By: Darliss Cheney M.D.   On: 11/26/2022 15:34   Recent Labs    11/25/22 0722  WBC 13.3*  HGB 12.3*  HCT 38.5*  PLT 412*    Recent Labs    11/24/22 1102  NA 132*  K 3.6  CL 100  CO2 19*  GLUCOSE 105*  BUN 13  CREATININE 1.16  CALCIUM 8.4*    Intake/Output Summary (Last 24 hours) at 11/27/2022 1028 Last data filed at 11/27/2022 0726 Gross per 24 hour  Intake 118 ml  Output 625 ml  Net -507 ml        Physical Exam: Vital Signs Blood pressure 116/86, pulse 99, temperature 98.5 F (36.9 C), temperature source Oral, resp. rate 18, height 6\' 3"  (1.905 m), weight 91.4 kg, SpO2 95%. Gen: no distress, normal appearing, BMI  25.19 HEENT: oral mucosa pink and moist, NCAT Cardio: RRR. No m/r/g. BL LE EDEMA -1+ pitting, improved; Chest: normal effort, normal rate of breathing Abd: soft, non-distended.  Nontender to palpation.  Skin:    General: Skin is warm and dry.     Comments: Infiltrated IV that's been removed in L AC fossa- large pore opening appearance- maybe ~ 3-4 mm opening- with associate slough- appears  improved R forearm IV - ok  Neurological:     Comments: AA, Oriented x 3 and follows commands. Mild cognitive delay, fatigue. Appropriate. Moving all 4 limbs antigravity and against resistance Intact to light touch in all  4 extremities, +dysarthria Recall 3/4   Psychiatric:     Comments: Very flat affect, slightly depressed.  No SI, but concerned regarding prognosis, being a burden on his family.   Musculoskeletal:  Prior exams RUE- 5/5 in biceps,triceps, grip and FA LUE- slightly weaker- 5-/5 in same muscles RLE- HF 4/5; KE/KF 4+/5; DF/PF 5-/5 LLE-  1/5 LLE, limited by edema Cervical myofascial tightness  Assessment/Plan: 1. Functional deficits which require 3+ hours per day of interdisciplinary therapy in a comprehensive inpatient rehab setting. Physiatrist is providing close team supervision and 24 hour management of active medical problems listed below. Physiatrist and rehab team continue to assess barriers to discharge/monitor patient progress toward functional and  medical goals  Care Tool:  Bathing    Body parts bathed by patient: Right arm, Abdomen, Chest, Left arm, Front perineal area, Buttocks, Right upper leg, Left upper leg, Face   Body parts bathed by helper: Right lower leg, Left lower leg     Bathing assist Assist Level: Minimal Assistance - Patient > 75%     Upper Body Dressing/Undressing Upper body dressing   What is the patient wearing?: Pull over shirt    Upper body assist Assist Level: Supervision/Verbal cueing    Lower Body Dressing/Undressing Lower body  dressing      What is the patient wearing?: Pants, Underwear/pull up     Lower body assist Assist for lower body dressing: Minimal Assistance - Patient > 75%     Toileting Toileting    Toileting assist Assist for toileting: Supervision/Verbal cueing     Transfers Chair/bed transfer  Transfers assist     Chair/bed transfer assist level: Minimal Assistance - Patient > 75%     Locomotion Ambulation   Ambulation assist      Assist level: Minimal Assistance - Patient > 75% Assistive device: Walker-rolling Max distance: 56ft   Walk 10 feet activity   Assist     Assist level: Minimal Assistance - Patient > 75% Assistive device: Walker-rolling   Walk 50 feet activity   Assist    Assist level: Minimal Assistance - Patient > 75% Assistive device: Walker-rolling    Walk 150 feet activity   Assist Walk 150 feet activity did not occur: Safety/medical concerns (fatigue)         Walk 10 feet on uneven surface  activity   Assist Walk 10 feet on uneven surfaces activity did not occur: Safety/medical concerns         Wheelchair     Assist Is the patient using a wheelchair?: Yes Type of Wheelchair: Manual    Wheelchair assist level: Dependent - Patient 0%      Wheelchair 50 feet with 2 turns activity    Assist        Assist Level: Dependent - Patient 0%   Wheelchair 150 feet activity     Assist      Assist Level: Dependent - Patient 0%   Blood pressure 116/86, pulse 99, temperature 98.5 F (36.9 C), temperature source Oral, resp. rate 18, height 6\' 3"  (1.905 m), weight 91.4 kg, SpO2 95%.  Medical Problem List and Plan: 1. 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  Reconsulted palliative care  Head CT ordered given worsening LLE weakness and dysarthria   2.  Antithrombotics: -DVT/anticoagulation:  Pharmaceutical: Heparin             -antiplatelet therapy: Aspirin 81 mg daily 3. Neck and back pain: kpad ordered, continue massage with OT, d/c percocet as wife feels this makes him confused  4. Mood/Behavior/Sleep: Provide emotional support             -  antipsychotic agents: N/A  5. Neuropsych/cognition: This patient is capable of making decisions on his own behalf.  6. Skin/Wound Care: Routine skin checks 7. Fluids/Electrolytes/Nutrition: Routine in and outs with follow-up chemistries 8.  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  Follow-up outpatient Dr. Mosetta Putt.  Currently not a candidate for more cancer treatment 9.  History of prostate cancer with prostatectomy.  Follow-up outpatient- pt has had leakage for 20 years since surgery done- wears briefs 10.  AKI.  Follow-up chemistries.  -BUN and creatinine stable off of IV fluids 7/20 to 7/21 11.  Hypertension.  Well controlled, decrease Norvasc to 5 mg daily.,  Toprol-XL 12.5 mg daily, d/c fluids    11/27/2022    5:00 AM 11/27/2022    4:59 AM 11/26/2022    7:58 PM  Vitals with BMI  Weight 201 lbs 8 oz    BMI 25.19    Systolic  116 120  Diastolic  86 86  Pulse  99 97   12.  Hyperlipidemia.  Lipitor 13.  History of tobacco use.  Quit smoking 3 months ago.  Provide counseling 14. DNR- made DNR and plans for outpt with hospice when discharged from CIR. Discussed with patient and he expressed wishes to remain DNR, there was some conflicting information in prior palliative care note, so have consulted palliative care to redicuss goals of care with patient  15.  Chronic nausea- on oral meds- helpful, but not resolved 16. Cough- notes better with new cough meds. Will monitor for any worsening of Sx's.   - 7/20: CXR mild bibasilar atelectasis; otherwise clear. ISB.  17. Hyponatremia: messaged SLP to see if we can advance him to a regular diet, repeat BMP reviewed and Na increased to 126, discussed with patient, salt tabs added, consulted medicine for additional recs, repeat BMP today  18. Decreased appetite/nasue/vomitting: increase mirtazepine to 15mg    - 7/21: N/V, loss of appetite today; labs with mild increase/stable ALP and AST, otherwise unchanged.  Start Drabinol 2.5 mg AC BID.  Already on scopolamine patch, as needed IV Zofran.  19. Insomnia: decrease mirtazepine back to 7.5mg  HS given confusion with higher dose  20. Leukocytosis: temperature reviewed and afebrile, repeat tomorrow to trend  21. Cancer-related fatigue/anasarca: discussed that this can be exacerbated by the Mirtazepine, but will continue this medication since it helped him sleep last night and will stimulate his appetite -start modafinil 100mg  daily today -dietician note reviewed, Boost ordered  - 7/20: Discussed need for increased protein intake d/t third spacing IVF; CXR as abbove; TEDs for LE edema. Daily weights for retention.  Filed Weights   11/24/22 0642 11/26/22 0500 11/27/22 0500  Weight: 91.8 kg 92.2 kg 91.4 kg   - 7/21: Edema improved with use of teds, monitor   22. AKI: Cr improved s/p fluids, check BMP today.   23. Cough at night: patient states improved when he can sleep, increase mirtazepine as above  24. Impaired cognition: d/c marinol, improved  25. Left lower extremity weakness: MRI brain ordered   LOS: 8 days A FACE TO FACE EVALUATION WAS PERFORMED  Clint Bolder P Laretha Luepke 11/27/2022, 10:28 AM

## 2022-11-27 NOTE — Progress Notes (Signed)
Patient ID: Mathew [name], male   DOB: [birth year], 72 y.o.   MRN: [id number]  Met with pt and spoke with wife via telephone to discuss team conference progress in therapies. New goals of min assist since slight decline. MD ordering MRI to check. Discussed again with wife plan home with home health versus home with hospice. She wants to begin with home health and then if begins to decline to transition to hospice. She feels the therapies he can get are important and will benefit him at home. Have scheduled for wife to be here 7/29 from 9-12 to do hands on education. Will let team know and work on discharge needs.

## 2022-11-28 MED ORDER — POTASSIUM CHLORIDE 20 MEQ PO PACK
40.0000 meq | PACK | Freq: Once | ORAL | Status: AC
  Administered 2022-11-28: 40 meq via ORAL
  Filled 2022-11-28: qty 2

## 2022-11-28 MED ORDER — SODIUM CHLORIDE 1 G PO TABS: 1.0000 g | ORAL_TABLET | Freq: Every day | ORAL | Status: DC

## 2022-11-28 NOTE — Progress Notes (Signed)
Occupational Therapy Session Note  Patient Details  Name: Mathew [name] MRN: [id number] Date of Birth: January 31, 1951  Today's Date: 11/28/2022 OT Individual Time: 2952-8413 OT Individual Time Calculation (min): 69 min    Short Term Goals: Week 2:  OT Short Term Goal 1 (Week 2): STGs=LTGs due to patient's length of stay.  Skilled Therapeutic Interventions/Progress Updates:    Patient received supine in bed.  Wife at bedside.  Patient concerned regarding incontinence of stool in brief.  Assisted patient to complete pericare.  Patient able to sit at edge of bed - cueing patient to push down with right leg to aide with moving left leg.  Patient expressing concern regarding fluid in BLE.  MD in and explained no DVT found in [place] yesterday.  Patient walked to bathroom to shower seat with Contact guard assist.  Patient reporting feeling ight headed, although BP within normal parameters - see vitals.  Transferred to commode then for urgent need to void.  Patient bumped wrist on commode, and small open area noted.  Nursing informed and addressed.   Patient showered seated requiring rest periods between each step of task.  Patient showing improved ability to reach below knees, transition to stand, and walk with contact guard.   Patient dressed self while seated at edge of bed.  Able to don pants over feet, then stand (from elevated bed) to pull pants over hips!  Patient requires assistance to don socks, ted hose.   Patient left up in bed with wife at bedside and bed alarm engaged.  Personal items and call bell in reach.     Therapy Documentation Precautions:  Precautions Precautions: Fall Precaution Comments: General debility; cancer Restrictions Weight Bearing Restrictions: Yes LLE Weight Bearing: Weight bearing as tolerated General:   Vital Signs: Therapy Vitals Pulse Rate: 115 BP: 116/82 Pain: Pain Assessment Pain Scale: 0-10 Pain Score: 6 base of neck   Therapy/Group: Individual  Therapy  Collier Salina 11/28/2022, 10:01 AM

## 2022-11-28 NOTE — Progress Notes (Signed)
Occupational Therapy Session Note  Patient Details  Name: Mathew [name] MRN: [id number] Date of Birth: 11-13-1950  Today's Date: 11/28/2022 OT Individual Time: 1300-1344 OT Individual Time Calculation (min): 44 min    Short Term Goals: Week 2:  OT Short Term Goal 1 (Week 2): STGs=LTGs due to patient's length of stay.  Skilled Therapeutic Interventions/Progress Updates:     Pt received semi-reclined in bed with with present in room. Pt presenting to be in good spirits receptive to skilled OT session reporting 0/10 pain- OT offering intermittent rest breaks, repositioning, and therapeutic support to optimize participation in therapy session. Focus this sessio energy conservation, functional mobility training, and activity tolerance. RN in/out at beginning of session to provid medications. Pt reporting he has not been eating or drinking much recently and that his energy levels are low. Provided simple education on importance of nutritional intake for BP management, energy, healing, and overall health with Pt verbalizing understanding. Pt receptive to drinking Ensure protein drink and juice during session. Pt transitioned to EOB with supervision using modified technique with HOB slightly elevated. Pt requiring significantly increased amount of time siting EOB to rest and motivate self for functional mobility. While sitting EOB provided education on energy conservation techniques with Pt verbalizing understanding. Pt then able to complete functional mobility using RW x3 trials (10 ft, 57ft, and 62ft) CGA with mod verbal cues required for head/trunk positioning and body alignment with prolonged rest breaks provided between trials. Pt heavily relying on RW throughout functional mobility and presenting with forward flexed posture- able to mildly improve with verbal/tactile cues, however unable to maintain upright position d/t muscle weakness and fatigue. Pt was left resting in TISWC with call bell in reach,  eat alarm on, wife present in room, and all needs met.    Therapy Documentation Precautions:  Precautions Precautions: Fall Precaution Comments: General debility; cancer Restrictions Weight Bearing Restrictions: Yes LLE Weight Bearing: Weight bearing as tolerated    Therapy/Group: Individual Therapy  Army Fossa 11/28/2022, 4:06 PM

## 2022-11-28 NOTE — Plan of Care (Signed)
  Problem: Consults Goal: RH STROKE PATIENT EDUCATION Description: See Patient Education module for education specifics  Outcome: Progressing   Problem: RH SAFETY Goal: RH STG ADHERE TO SAFETY PRECAUTIONS W/ASSISTANCE/DEVICE Description: STG Adhere to Safety Precautions With min Assistance/Device. Outcome: Progressing   Problem: RH KNOWLEDGE DEFICIT Goal: RH STG INCREASE KNOWLEDGE OF HYPERTENSION Description: Patient and wife will be able to manage care/HTN at discharge with medications and dietary modifications using educational resources independently Outcome: Progressing Goal: RH STG INCREASE KNOWLEDGE OF DYSPHAGIA/FLUID INTAKE Description: Patient and wife will be able to manage dysphagia/diet at discharge with medications and dietary modifications using educational resources independently Outcome: Progressing Goal: RH STG INCREASE KNOWLEGDE OF HYPERLIPIDEMIA Description: Patient and wife will be able to manage care/HLD at discharge with medications and dietary modifications using educational resources independently Outcome: Progressing Goal: RH STG INCREASE KNOWLEDGE OF STROKE PROPHYLAXIS Description: Patient and wife will be able to manage secondary risks at discharge with medications and dietary modifications using educational resources independently Outcome: Progressing   Problem: Education: Goal: Knowledge of discharge needs will improve Outcome: Progressing   Problem: Clinical Measurements: Goal: Postoperative complications will be avoided or minimized Outcome: Progressing   Problem: Respiratory: Goal: Ability to achieve and maintain a regular respiratory rate will improve Outcome: Progressing   Problem: Skin Integrity: Goal: Demonstration of wound healing without infection will improve Outcome: Progressing   Problem: Education: Goal: Knowledge of disease or condition will improve Outcome: Progressing Goal: Knowledge of secondary prevention will improve (MUST  DOCUMENT ALL) Outcome: Progressing Goal: Knowledge of patient specific risk factors will improve Loraine Leriche N/A or DELETE if not current risk factor) Outcome: Progressing   Problem: Ischemic Stroke/TIA Tissue Perfusion: Goal: Complications of ischemic stroke/TIA will be minimized Outcome: Progressing   Problem: Coping: Goal: Will verbalize positive feelings about self Outcome: Progressing Goal: Will identify appropriate support needs Outcome: Progressing   Problem: Health Behavior/Discharge Planning: Goal: Ability to manage health-related needs will improve Outcome: Progressing Goal: Goals will be collaboratively established with patient/family Outcome: Progressing   Problem: Self-Care: Goal: Ability to participate in self-care as condition permits will improve Outcome: Progressing Goal: Verbalization of feelings and concerns over difficulty with self-care will improve Outcome: Progressing Goal: Ability to communicate needs accurately will improve Outcome: Progressing   Problem: Nutrition: Goal: Risk of aspiration will decrease Outcome: Progressing Goal: Dietary intake will improve Outcome: Progressing

## 2022-11-28 NOTE — Progress Notes (Signed)
PROGRESS NOTE   Subjective/Complaints: Wishes he had more appetite. Discussed that it seemed the appetite stimulant medications negatively affected his cognition, he is doing better today  ROS: + Nausea/vomitting - fluctuating + Edema - improved + SOB - improved +cough +LLE weakness Slurred speech improved Feels that mouth is full of saliva  Objective:   VAS [place] LOWER EXTREMITY VENOUS (DVT)  Result Date: 11/27/2022  Lower Venous DVT Study Patient Name:  Mathew [name]  Date of Exam:   11/27/2022 Medical Rec #: [id number]     Accession #:    [id number] Date of Birth: 1951-04-16    Patient Gender: M Patient Age:   72 years Exam Location:  [facility] Procedure:      VAS [place] LOWER EXTREMITY VENOUS (DVT) Referring Phys: Sula Soda --------------------------------------------------------------------------------  Indications: Swelling.  Comparison Study: No previous study. Performing Technologist: McKayla Maag RVT, [place]  Examination Guidelines: A complete evaluation includes B-mode imaging, spectral Doppler, color Doppler, and power Doppler as needed of all accessible portions of each vessel. Bilateral testing is considered an integral part of a complete examination. Limited examinations for reoccurring indications may be performed as noted. The reflux portion of the exam is performed with the patient in reverse Trendelenburg.  +-----+---------------+---------+-----------+----------+--------------+ RIGHTCompressibilityPhasicitySpontaneityPropertiesThrombus Aging +-----+---------------+---------+-----------+----------+--------------+ CFV  Full           Yes      Yes                                 +-----+---------------+---------+-----------+----------+--------------+ SFJ  Full                                                        +-----+---------------+---------+-----------+----------+--------------+    +---------+---------------+---------+-----------+----------+--------------+ LEFT     CompressibilityPhasicitySpontaneityPropertiesThrombus Aging +---------+---------------+---------+-----------+----------+--------------+ CFV      Full           Yes      Yes                                 +---------+---------------+---------+-----------+----------+--------------+ SFJ      Full                                                        +---------+---------------+---------+-----------+----------+--------------+ FV Prox  Full                                                        +---------+---------------+---------+-----------+----------+--------------+ FV Mid   Full                                                        +---------+---------------+---------+-----------+----------+--------------+  FV DistalFull                                                        +---------+---------------+---------+-----------+----------+--------------+ PFV      Full                                                        +---------+---------------+---------+-----------+----------+--------------+ POP      Full           Yes      Yes                                 +---------+---------------+---------+-----------+----------+--------------+ PTV      Full                                                        +---------+---------------+---------+-----------+----------+--------------+ PERO     Full                                                        +---------+---------------+---------+-----------+----------+--------------+     Summary: RIGHT: - No evidence of common femoral vein obstruction.  LEFT: - There is no evidence of deep vein thrombosis in the lower extremity.  - No cystic structure found in the popliteal fossa.  *See table(s) above for measurements and observations. Electronically signed by Heath Lark on 11/27/2022 at 7:44:13 PM.    Final    MR BRAIN WO  CONTRAST  Result Date: 11/27/2022 CLINICAL DATA:  Worsening left-sided weakness. EXAM: MRI HEAD WITHOUT CONTRAST TECHNIQUE: Multiplanar, multiecho pulse sequences of the brain and surrounding structures were obtained without intravenous contrast. COMPARISON:  MRI brain 11/01/2022. FINDINGS: Brain: Subacute infarct in the left aspect of the splenium of the corpus callosum. No acute infarct or hemorrhage. No mass effect or midline shift. Background of mild chronic small-vessel disease. No hydrocephalus or extra-axial collection. No foci of abnormal susceptibility. Vascular: Normal flow voids. Skull and upper cervical spine: Normal marrow signal. Sinuses/Orbits: Unremarkable. Other: None. IMPRESSION: Subacute infarct in the left aspect of the splenium of the corpus callosum. No acute intracranial process. Electronically Signed   By: Orvan Falconer M.D.   On: 11/27/2022 13:47   CT HEAD WO CONTRAST ( )  Result Date: 11/26/2022 CLINICAL DATA:  Mental status change EXAM: CT HEAD WITHOUT CONTRAST TECHNIQUE: Contiguous axial images were obtained from the base of the skull through the vertex without intravenous contrast. RADIATION DOSE REDUCTION: This exam was performed according to the departmental dose-optimization program which includes automated exposure control, adjustment of the mA and/or kV according to patient size and/or use of iterative reconstruction technique. COMPARISON:  MRI brain 11/01/2022.  Head CT 11/01/2022. FINDINGS: Brain: No evidence of acute infarction, hemorrhage, hydrocephalus, extra-axial collection or mass lesion/mass effect. Vascular: No hyperdense vessel  or unexpected calcification. Skull: Normal. Negative for fracture or focal lesion. Sinuses/Orbits: No acute finding. Other: None. IMPRESSION: No acute intracranial abnormality. Electronically Signed   By: Darliss Cheney M.D.   On: 11/26/2022 15:34   No results for input(s): "WBC", "HGB", "HCT", "PLT" in the last 72 hours.   Recent Labs     11/27/22 1552  NA 132*  K 3.4*  CL 104  CO2 20*  GLUCOSE 80  BUN 11  CREATININE 0.90  CALCIUM 8.2*    Intake/Output Summary (Last 24 hours) at 11/28/2022 1033 Last data filed at 11/28/2022 1610 Gross per 24 hour  Intake 236 ml  Output 350 ml  Net -114 ml        Physical Exam: Vital Signs Blood pressure (!) 155/87, pulse 97, temperature 98.7 F (37.1 C), temperature source Oral, resp. rate 16, height 6\' 3"  (1.905 m), weight 91.4 kg, SpO2 92%. Gen: no distress, normal appearing, BMI 25.19 HEENT: oral mucosa pink and moist, NCAT Cardio: RRR. No m/r/g. BL LE EDEMA -1+ pitting, improved; Chest: normal effort, normal rate of breathing Abd: soft, non-distended.  Nontender to palpation.  Skin:    General: Skin is warm and dry.     Comments: Infiltrated IV that's been removed in L AC fossa- large pore opening appearance- maybe ~ 3-4 mm opening- with associate slough- appears  improved R forearm IV - ok  Neurological:     Comments: AA, Oriented x 3 and follows commands. Mild cognitive delay, fatigue. Appropriate. Moving all 4 limbs antigravity and against resistance Intact to light touch in all  4 extremities, +dysarthria Recall 3/4   Psychiatric:     Comments: Very flat affect, slightly depressed.  No SI, but concerned regarding prognosis, being a burden on his family.   Musculoskeletal:  Prior exams RUE- 5/5 in biceps,triceps, grip and FA LUE- slightly weaker- 5-/5 in same muscles RLE- HF 4/5; KE/KF 4+/5; DF/PF 5-/5 LLE-  3/5 LLE, limited by edema Cervical myofascial tightness  Assessment/Plan: 1. Functional deficits which require 3+ hours per day of interdisciplinary therapy in a comprehensive inpatient rehab setting. Physiatrist is providing close team supervision and 24 hour management of active medical problems listed below. Physiatrist and rehab team continue to assess barriers to discharge/monitor patient progress toward functional and medical goals  Care  Tool:  Bathing    Body parts bathed by patient: Right arm, Abdomen, Chest, Left arm, Front perineal area, Right upper leg, Left upper leg, Face, Right lower leg, Left lower leg   Body parts bathed by helper: Buttocks     Bathing assist Assist Level: Minimal Assistance - Patient > 75%     Upper Body Dressing/Undressing Upper body dressing   What is the patient wearing?: Pull over shirt    Upper body assist Assist Level: Set up assist    Lower Body Dressing/Undressing Lower body dressing      What is the patient wearing?: Incontinence brief, Pants     Lower body assist Assist for lower body dressing: Moderate Assistance - Patient 50 - 74%     Toileting Toileting Toileting Activity did not occur Press photographer and hygiene only): N/A (no void or bm)  Toileting assist Assist for toileting: Minimal Assistance - Patient > 75%     Transfers Chair/bed transfer  Transfers assist     Chair/bed transfer assist level: Contact Guard/Touching assist     Locomotion Ambulation   Ambulation assist      Assist level: Minimal Assistance - Patient >  75% Assistive device: Walker-rolling Max distance: 44ft   Walk 10 feet activity   Assist     Assist level: Minimal Assistance - Patient > 75% Assistive device: Walker-rolling   Walk 50 feet activity   Assist    Assist level: Minimal Assistance - Patient > 75% Assistive device: Walker-rolling    Walk 150 feet activity   Assist Walk 150 feet activity did not occur: Safety/medical concerns (fatigue)         Walk 10 feet on uneven surface  activity   Assist Walk 10 feet on uneven surfaces activity did not occur: Safety/medical concerns         Wheelchair     Assist Is the patient using a wheelchair?: Yes Type of Wheelchair: Manual    Wheelchair assist level: Dependent - Patient 0%      Wheelchair 50 feet with 2 turns activity    Assist        Assist Level: Dependent - Patient  0%   Wheelchair 150 feet activity     Assist      Assist Level: Dependent - Patient 0%   Blood pressure (!) 155/87, pulse 97, temperature 98.7 F (37.1 C), temperature source Oral, resp. rate 16, height 6\' 3"  (1.905 m), weight 91.4 kg, SpO2 92%.  Medical Problem List and Plan: 1. 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  Reconsulted palliative care  Head CT ordered given worsening LLE weakness and dysarthria   2.  Antithrombotics: -DVT/anticoagulation:  Pharmaceutical: Heparin             -antiplatelet therapy: Aspirin 81 mg daily 3. Neck and back pain: kpad ordered, continue massage with OT, d/c percocet as wife feels this makes him confused  4. Mood/Behavior/Sleep: Provide emotional support             -antipsychotic agents: N/A  5. Neuropsych/cognition: This patient is capable of making decisions on his own behalf.  6. Skin/Wound Care: Routine skin checks 7. Fluids/Electrolytes/Nutrition: Routine in and outs with follow-up chemistries 8.  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  Follow-up outpatient Dr. Mosetta Putt.  Currently not a candidate for more cancer treatment 9.  History of prostate cancer with prostatectomy.  Follow-up outpatient- pt has had leakage for 20 years since surgery done- wears briefs 10.  AKI.  Follow-up chemistries.  -BUN and creatinine stable off of IV fluids 7/20 to 7/21 11.  Hypertension.  Well controlled, decrease Norvasc to 5 mg daily.,  Toprol-XL  12.5 mg daily, d/c fluids    11/28/2022    8:21 AM 11/28/2022    5:41 AM 11/27/2022    8:11 PM  Vitals with BMI  Systolic 155 147 161  Diastolic 87 84 82  Pulse 97 102 100   12.  Hyperlipidemia.  Lipitor 13.  History of tobacco use.  Quit smoking 3 months ago.  Provide counseling 14. DNR- made DNR and plans for outpt with hospice  when discharged from CIR. Discussed with patient and he expressed wishes to remain DNR, there was some conflicting information in prior palliative care note, so have consulted palliative care to redicuss goals of care with patient  15. Chronic nausea- on oral meds- helpful, but not resolved 16. Cough- notes better with new cough meds. Will monitor for any worsening of Sx's.   - 7/20: CXR mild bibasilar atelectasis; otherwise clear. ISB.  17. Hyponatremia: messaged SLP to see if we can advance him to a regular diet, repeat BMP reviewed and Na increased to 132, decrease salt tab to daily  18. Decreased appetite/nasue/vomitting: decreased mirtazpeing and d/ced marinol due to negative cognitive side effects, water chart ordered and asked nursing to message me how much he drinks by end of shift  19. Insomnia: decrease mirtazepine back to 7.5mg  HS given confusion with higher dose  20. Leukocytosis: temperature reviewed and afebrile, repeat tomorrow to trend  21. Cancer-related fatigue/anasarca: discussed that this can be exacerbated by the Mirtazepine, but will continue this medication since it helped him sleep last night and will stimulate his appetite -start modafinil 100mg  daily today -dietician note reviewed, Boost ordered  - 7/20: Discussed need for increased protein intake d/t third spacing IVF; CXR as abbove; TEDs for LE edema. Daily weights for retention.  Filed Weights   11/24/22 0642 11/26/22 0500 11/27/22 0500  Weight: 91.8 kg 92.2 kg 91.4 kg   - 7/21: Edema improved with use of teds, monitor   22. AKI: Cr improved s/p fluids, check BMP today.   23.  Cough at night: patient states improved when he can sleep, increase mirtazepine as above  24. Impaired cognition: d/c marinol, improved  25. Left lower extremity weakness: MRI brain ordered and discussed with patient and wife that it is negative for acute stroke. Discussed with OT that weakness has improved  >50 minutes spent in review of Brain MRI and discussion with patient and wife that there is no acute stroke, discussed with OT that left lower extremity weakness has improved, discussed decreased appetite and placed order for water chart, asked nursing to message me how much patient drinks by end of the shift, Na level reviewed and has improved, decreased salt tab to daily   LOS: 9 days A FACE TO FACE EVALUATION WAS PERFORMED  Drema Pry Ardyn Forge 11/28/2022, 10:33 AM

## 2022-11-28 NOTE — Progress Notes (Signed)
Speech Language Pathology Daily Session Note  Patient Details  Name: Mathew [name] MRN: [id number] Date of Birth: 04-11-1951  Today's Date: 11/28/2022 SLP Individual Time: 1100-1159 SLP Individual Time Calculation (min): 59 min  Short Term Goals: Week 2: SLP Short Term Goal 1 (Week 2): Patient will demonstrate problem solving abilities in mildly complex daily situations with minA SLP Short Term Goal 2 (Week 2): Patient will utilize memory compensatory aids with minA SLP Short Term Goal 3 (Week 2): Patient will recall memory compensatory aids with supervision A SLP Short Term Goal 4 (Week 2): Patient will utilize speech intelligibility increasing strategies (SLOP) to improve communication across contexts with intelligibility increasing to 90% at the phrase level given minA  Skilled Therapeutic Interventions: SLP conducted skilled therapy session targeting cognitive retraining. Patient was oriented to date and day of the week independently. SLP updated wall calendar to reflect date and encouraged patient to utilize strategy to remain oriented. Patient became disoriented to date variously throughout session, sometimes referring to date as 7/24. Patient recalled 1/4 speech strategies to increase intelligibility prior to reeducation. SLP utilized handout available in room from previous session to reorient patient to speech intelligibility strategies and then guided patient through task to implement strategies. Patient utilized slow, loud, overarticulate, and pause strategies consistently given minA during structured task. Patient recalled 3/4 memory WRAP strategies independently and stated that 'associate' and 'picture it' strategies should be targets this date. SLP guided patient through memory association exercise with novel information via spoken paragraph; after 10 minute distracted delay, patient recalled 75% of information independently and remaining fact with minA. SLP guided patient through and  basic and moderately complex money calculation problems with patient required modA to attend to small details in problems to ensure accurate answers. Patient was left in lowered bed with call bell in reach and bed alarm set. SLP will continue to target goals per plan of care.     Pain Pain Assessment Pain Scale: 0-10 Pain Score: 0-No pain  Therapy/Group: Individual Therapy  Jeannie Done, M.A., CCC-SLP  Yetta Barre 11/28/2022, 11:19 AM

## 2022-11-28 NOTE — Progress Notes (Signed)
Physical Therapy Session Note  Patient Details  Name: Mathew [name] MRN: [id number] Date of Birth: [birth year]  Today's Date: 11/28/2022 PT Individual Time: 1430-1500 PT Individual Time Calculation (min): 30 min   Short Term Goals: Week 2:  PT Short Term Goal 1 (Week 2): Pt will complete bed mobility with CGA PT Short Term Goal 2 (Week 2): Pt will complete bed <>chair transfer with CGA + LRAD PT Short Term Goal 3 (Week 2): Pt will amb 151ft amb with CGA + LRAD PT Short Term Goal 4 (Week 2): Pt will navigate 2 steps with ModA +  rails per home set up  Skilled Therapeutic Interventions/Progress Updates:    Session focused on overall activity tolerance (seated and standing) and functional strengthening while participating in social setting while participating in dance therapy with a group of other patients Pt utilized all 4 extremities from seated position to engage in stretches and dynamic activities to the music. Pt able to tolerate 1 bout of standing ~ 2 min while performing modified dance for marching in place and weightshifting during dance with RW for support - overall min assist for activity. End of session request to return back to bed and performed transfer without AD with min assist from w/c to bed. Mod assist to return to supine for BLE management.   Therapy Documentation Precautions:  Precautions Precautions: Fall Precaution Comments: General debility; cancer Restrictions Weight Bearing Restrictions: Yes LLE Weight Bearing: Weight bearing as tolerated  Pain:  Does not complain of pain. Reporting nausea.     Therapy/Group: Individual Therapy  Karolee Stamps Darrol Poke, PT, DPT, CBIS  11/28/2022, 3:22 PM

## 2022-11-29 ENCOUNTER — Inpatient Hospital Stay (HOSPITAL_COMMUNITY): Payer: BC Managed Care – PPO

## 2022-11-29 DIAGNOSIS — I614 Nontraumatic intracerebral hemorrhage in cerebellum: Secondary | ICD-10-CM

## 2022-11-29 LAB — GLUCOSE, CAPILLARY: Glucose-Capillary: 99 mg/dL (ref 70–99)

## 2022-11-29 MED ORDER — ACETAMINOPHEN 325 MG PO TABS
350.0000 mg | ORAL_TABLET | ORAL | Status: DC | PRN
  Administered 2022-11-29: 650 mg via ORAL
  Filled 2022-11-29: qty 2

## 2022-11-29 MED ORDER — ACETAMINOPHEN 325 MG PO TABS: 350.0000 mg | ORAL_TABLET | Freq: Four times a day (QID) | ORAL | Status: DC | PRN

## 2022-12-03 ENCOUNTER — Other Ambulatory Visit: Payer: Self-pay

## 2022-12-04 ENCOUNTER — Ambulatory Visit: Payer: BC Managed Care – PPO | Admitting: Hematology

## 2022-12-04 ENCOUNTER — Other Ambulatory Visit: Payer: BC Managed Care – PPO

## 2022-12-04 ENCOUNTER — Ambulatory Visit: Payer: BC Managed Care – PPO

## 2022-12-05 ENCOUNTER — Telehealth: Payer: BC Managed Care – PPO

## 2022-12-05 DIAGNOSIS — 419620001 Death: Secondary | SNOMED CT | POA: Diagnosis not present

## 2022-12-05 NOTE — Progress Notes (Signed)
   11/24/2022 0900  Spiritual Encounters  Type of Visit Initial  Care provided to: [facility] partners present during encounter Nurse  Referral source Family  Reason for visit Patient death  OnCall Visit No   Ch responded to request for emotional and spiritual support. The pt's wife was at bedside. Ch provided hospitality and helped family process the loss. No follow-up needed at this time.

## 2022-12-05 NOTE — Progress Notes (Signed)
Occupational Therapy Discharge Note   This patient was unable to complete the inpatient rehab program due to his unplanned death.  This patient is being discharged from OT services at this time.   Bretta Bang, OTR/L 11/11/2022  3:36 PM

## 2022-12-05 NOTE — Progress Notes (Addendum)
Neurology Progress Note  Patient ID: Mathew [name] is a 72 y.o. with PMHx of  has a past medical history of Hepatitis C, Hypertension, Obstructive sleep apnea, Premature ventricular contraction, and Prostate cancer (HCC) (2006).  Interval/subjective:  Patient has been working with physical therapy.  He has had fluctuating nausea and vomiting, improving shortness of breath and edema but persistent cough and in particular left lower extremity greater than right lower extremity weakness with slight weakness in the left upper extremity compared to the right as well.  Per notes today he was frustrated by lack of progress and considering transition to hospice if he is not a candidate for chemotherapy for his malignancy.  At around 2 AM he began to have headache for which she was given Tylenol without much improvement in his symptoms.  He then had worsening nausea and vomiting at 3 AM for which she was given Zofran.  Subsequently he began to have dysarthric speech and worsening weakness for which a stat head CT was obtained at 4:51 AM.  Neurology was notified of these results immediately after head CT was obtained, and I briefly reviewed the chart and went to evaluate the patient at bedside  Exam: Vitals:   11/07/2022 0344 12/03/2022 0458  BP: (!) 134/94 (!) 152/96  Pulse: (!) 110   Resp: 18 18  Temp: 98 F (36.7 C) (!) 97.5 F (36.4 C)  SpO2: 96% 100%   Gen: In bed, comfortable  Resp: non-labored breathing, no grossly audible wheezing Cardiac: Perfusing extremities well  Abd: soft, nt  Neuro: Awake, able to follow commands, nonverbal, able to track examiner bilaterally.  Not able to protrude his tongue.  Left greater than right sided weakness, able to maintain his right upper extremity antigravity but the left upper extremity shifted rapidly to the bed.  Withdraws to light tickle in bilateral feet  Pertinent Labs:  Basic Metabolic Panel: Recent Labs  Lab 11/22/22 0730 11/23/22 0529  11/24/22 1102 11/27/22 1552  NA 126* 130* 132* 132*  K 4.2 3.8 3.6 3.4*  CL 99 102 100 104  CO2 19* 19* 19* 20*  GLUCOSE 85 98 105* 80  BUN 19 19 13 11   CREATININE 1.28* 1.13 1.16 0.90  CALCIUM 8.1* 8.0* 8.4* 8.2*  MG  --   --  1.6*  --   PHOS  --   --  2.6  --     CBC: Recent Labs  Lab 11/25/22 0722  WBC 13.3*  NEUTROABS 8.2*  HGB 12.3*  HCT 38.5*  MCV 80.5  PLT 412*   Lab Results  Component Value Date   INR 1.3 (H) 11/01/2022   INR 1.3 (H) 10/25/2022   INR 1.0 09/02/2022    Head CT personally reviewed and reviewed with wife at bedside, agree with radiology read 1. Positive for large acute Cerebellar Hemorrhage with extra-axial extension of blood in the right posterior fossa. Estimated 32 mL right cerebellar hematoma, with 4 mm thick adjacent probable SDH. 2. Cerebellar edema and posterior fossa mass effect including on the brainstem and 4th ventricle. Partially effaced basilar cisterns and early lateral and 3rd ventriculomegaly. No tonsillar herniation and no other supratentorial mass effect at this time.  GCS [id number] = 11  ICH score 3  Impression: Discussed with wife at bedside that the patient has had a life-threatening cerebellar bleed.  Etiology potentially hypertensive versus hemorrhagic conversion of ischemic stroke in the setting of coagulopathy secondary to hepatic dysfunction. Given his goals of care she expressed that he  would not want aggressive interventions and decision was made to transition to comfort care measures.  Recommendations: - Initiation of comfort care measures - As it is possible (but not guaranteed) that the patient may die within minutes to hours, recommend against transferring the patient at this time if comfort care can be achieved within the current room setting. If he stabilizes this can be reconsidered later. - Discussed with primary team in person and via phone - Inpatient neurology will sign off at this time, please do not  hesitate to reach out to stroke team attending if questions or concerns arise  Brooke Dare MD-PhD [facility] [phone]  Available 7 PM to 7 AM, outside of these hours please call Neurologist on call as listed on Amion.  CRITICAL CARE Performed by: Gordy Councilman   Total critical care time: 35 minutes  Critical care time was exclusive of separately billable procedures and treating other patients.  Critical care was necessary to treat or prevent imminent or life-threatening deterioration.  Critical care was time spent personally by me on the following activities: development of treatment plan with patient and/or surrogate as well as nursing, discussions with consultants, evaluation of patient's response to treatment, examination of patient, obtaining history from patient or surrogate, ordering and performing treatments and interventions, ordering and review of laboratory studies, ordering and review of radiographic studies, pulse oximetry and re-evaluation of patient's condition.

## 2022-12-05 NOTE — Discharge Summary (Signed)
Physician Discharge Summary  Patient ID: Mathew [name] MRN: [id number] DOB/AGE: Mar 27, 1951 72 y.o.  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 Rollene Fare on 11/18/2022 at 0456 hours. Electronically Signed   By: [name] M.D.   On: 11/06/2022 05:34   Result Date: 11/11/2022 CLINICAL DATA:  72 year old male with headache. Recent small infarct at the splenium of the corpus callosum. Status post aortic dissection repair last month. EXAM: CT HEAD WITHOUT CONTRAST TECHNIQUE: Contiguous axial images were obtained from the base of the skull through the vertex without intravenous contrast. RADIATION DOSE REDUCTION: This exam was performed according to the departmental dose-optimization program which includes automated exposure control, adjustment of the mA and/or kV according to patient size and/or use of iterative reconstruction technique. COMPARISON:  Brain MRI 11/27/2022 and earlier. FINDINGS: Brain: Hyperdense hemorrhage within the right cerebellum, and extra-axial blood extension within the right posterior fossa (series 4, image 10). Intra-axial hemorrhage epicenter within the right cerebellum is posterior to the deep cerebellar nuclei. Heterogeneous hyperdense intra-axial hematoma size is 40 x 50 x 32 mm (AP by transverse by CC), for estimated intra-axial blood volume of 32 mL.  Superimposed 4 mm thick extra-axial blood posterior to the right cerebellum, more resembles subdural than subarachnoid hemorrhage. Moderate superimposed hypodense cerebellar edema. Posterior fossa mass effect including on the dorsal brainstem and subtotal effacement of the 4th ventricle (series 4, image 10). Mild lateral and 3rd ventricular enlargement compared to 11/27/2022. No supratentorial transependymal edema at this time. Effaced pre medullary and prepontine cisterns, but no tonsillar herniation at this time. Unremarkable noncontrast MRI appearance of the right cerebellar hemisphere 2 days ago. Unclear whether this is an acute hemorrhagic infarct, acute small vessel disease, or sequelae of coagulopathy. No other acute intracranial hemorrhage. Stable supratentorial gray-white differentiation. No supratentorial midline shift. Vascular: No suspicious intracranial vascular hyperdensity. Skull: Intact.  No osseous abnormality identified. Sinuses/Orbits: Visualized paranasal sinuses and mastoids are stable and well aerated. Other: Leftward gaze. Visualized scalp soft tissues are within normal limits. IMPRESSION: 1. Positive for large acute Cerebellar Hemorrhage with extra-axial extension of blood in the right posterior fossa. Estimated 32 mL right cerebellar hematoma, with 4 mm thick adjacent probable SDH. 2. Cerebellar edema and posterior fossa mass effect including on the brainstem and 4th ventricle. Partially effaced basilar cisterns and early lateral and 3rd ventriculomegaly. No tonsillar herniation and no other supratentorial mass effect at this time. Electronically Signed: By: [name] M.D. On: 11/22/2022 04:53   VAS [place] LOWER EXTREMITY VENOUS (DVT)  Result Date: 11/27/2022  Lower Venous DVT Study Patient Name:  Mathew [name]  Date of Exam:   11/27/2022 Medical Rec #: [id number]     Accession #:    [id number] Date of Birth: May 06, 1951    Patient Gender: M Patient Age:   18 years Exam Location:  [facility] Procedure:      VAS [place] LOWER EXTREMITY VENOUS (DVT) Referring Phys: Sula Soda --------------------------------------------------------------------------------  Indications: Swelling.  Comparison Study: No  previous study. Performing Technologist: McKayla Maag RVT, [place]  Examination Guidelines: A complete evaluation includes B-mode imaging, spectral Doppler, color Doppler, and power Doppler as needed of all accessible portions of each vessel. Bilateral testing is considered an integral part of a complete examination. Limited examinations for reoccurring indications may be performed as noted. The reflux portion of the exam is performed with the patient in reverse Trendelenburg.  +-----+---------------+---------+-----------+----------+--------------+ RIGHTCompressibilityPhasicitySpontaneityPropertiesThrombus Aging +-----+---------------+---------+-----------+----------+--------------+ CFV  Full           Yes      Yes                                 +-----+---------------+---------+-----------+----------+--------------+ SFJ  Full                                                        +-----+---------------+---------+-----------+----------+--------------+   +---------+---------------+---------+-----------+----------+--------------+ LEFT     CompressibilityPhasicitySpontaneityPropertiesThrombus Aging +---------+---------------+---------+-----------+----------+--------------+ CFV      Full           Yes      Yes                                 +---------+---------------+---------+-----------+----------+--------------+ SFJ      Full                                                        +---------+---------------+---------+-----------+----------+--------------+ FV Prox  Full                                                        +---------+---------------+---------+-----------+----------+--------------+ FV Mid   Full                                                         +---------+---------------+---------+-----------+----------+--------------+ FV DistalFull                                                        +---------+---------------+---------+-----------+----------+--------------+ PFV      Full                                                        +---------+---------------+---------+-----------+----------+--------------+ POP      Full           Yes      Yes                                 +---------+---------------+---------+-----------+----------+--------------+  PTV      Full                                                        +---------+---------------+---------+-----------+----------+--------------+ PERO     Full                                                        +---------+---------------+---------+-----------+----------+--------------+     Summary: RIGHT: - No evidence of common femoral vein obstruction.  LEFT: - There is no evidence of deep vein thrombosis in the lower extremity.  - No cystic structure found in the popliteal fossa.  *See table(s) above for measurements and observations. Electronically signed by Heath Lark on 11/27/2022 at 7:44:13 PM.    Final    MR BRAIN WO CONTRAST  Result Date: 11/27/2022 CLINICAL DATA:  Worsening left-sided weakness. EXAM: MRI HEAD WITHOUT CONTRAST TECHNIQUE: Multiplanar, multiecho pulse sequences of the brain and surrounding structures were obtained without intravenous contrast. COMPARISON:  MRI brain 11/01/2022. FINDINGS: Brain: Subacute infarct in the left aspect of the splenium of the corpus callosum. No acute infarct or hemorrhage. No mass effect or midline shift. Background of mild chronic small-vessel disease. No hydrocephalus or extra-axial collection. No foci of abnormal susceptibility. Vascular: Normal flow voids. Skull and upper cervical spine: Normal marrow signal. Sinuses/Orbits: Unremarkable. Other: None. IMPRESSION: Subacute infarct in the left aspect of  the splenium of the corpus callosum. No acute intracranial process. Electronically Signed   By: Orvan Falconer M.D.   On: 11/27/2022 13:47   CT HEAD WO CONTRAST ( )  Result Date: 11/26/2022 CLINICAL DATA:  Mental status change EXAM: CT HEAD WITHOUT CONTRAST TECHNIQUE: Contiguous axial images were obtained from the base of the skull through the vertex without intravenous contrast. RADIATION DOSE REDUCTION: This exam was performed according to the departmental dose-optimization program which includes automated exposure control, adjustment of the mA and/or kV according to patient size and/or use of iterative reconstruction technique. COMPARISON:  MRI brain 11/01/2022.  Head CT 11/01/2022. FINDINGS: Brain: No evidence of acute infarction, hemorrhage, hydrocephalus, extra-axial collection or mass lesion/mass effect. Vascular: No hyperdense vessel or unexpected calcification. Skull: Normal. Negative for fracture or focal lesion. Sinuses/Orbits: No acute finding. Other: None. IMPRESSION: No acute intracranial abnormality. Electronically Signed   By: Darliss Cheney M.D.   On: 11/26/2022 15:34   DG Abd 1 View  Result Date: 11/24/2022 CLINICAL DATA:  Vomiting. EXAM: ABDOMEN - 1 VIEW COMPARISON:  11/14/2022 FINDINGS: The bowel gas pattern appears normal. No dilated loops of small or large bowel. Gas and stool noted in the within the colon. No abnormal abdominal or pelvic calcifications. Degenerative changes noted within both hips. IMPRESSION: Nonobstructive bowel gas pattern. Electronically Signed   By: Signa Kell M.D.   On: 11/24/2022 16:22   DG Chest Port 1 View  Result Date: 11/23/2022 CLINICAL DATA:  Shortness of breath EXAM: PORTABLE CHEST 1 VIEW COMPARISON:  Chest x-ray 11/14/2022 FINDINGS: Aortic graft again noted. Loop recorder device overlies the left chest. The cardiomediastinal silhouette is stable. There is no focal lung consolidation, pleural effusion or pneumothorax. There is minimal left base  atelectasis. No acute fractures are  seen. IMPRESSION: Minimal left base atelectasis. Otherwise no evidence of acute cardiopulmonary process. Electronically Signed   By: Darliss Cheney M.D.   On: 11/23/2022 16:25   DG Abd 1 View  Result Date: 11/14/2022 CLINICAL DATA:  Loss of appetite for several days, initial encounter EXAM: ABDOMEN - 1 VIEW COMPARISON:  11/05/2022 FINDINGS: Gastric catheter has been removed. Scattered large and small bowel gas is noted. No obstructive changes are seen. No free air is noted. No acute bony abnormality is seen. IMPRESSION: No acute abnormality noted. Electronically Signed   By: Alcide Clever M.D.   On: 11/14/2022 11:43   DG CHEST PORT 1 VIEW  Result Date: 11/14/2022 CLINICAL DATA:  Fever and loss of appetite, initial encounter EXAM: PORTABLE CHEST 1 VIEW COMPARISON:  11/05/2022 FINDINGS: Cardiac shadow is stable. Endovascular stent graft is again seen and stable. Mild atelectatic changes are again noted in the bases. No new focal infiltrate is seen. No bony abnormality is noted. IMPRESSION: Stable basilar atelectasis. Electronically Signed   By: Alcide Clever M.D.   On: 11/14/2022 11:42   DG Abd 1 View  Result Date: 11/05/2022 CLINICAL DATA:  Nasogastric tube placement EXAM: ABDOMEN - 1 VIEW COMPARISON:  11/05/2022 at 2:20 p.m. FINDINGS: A feeding tube is present coils once in the stomach with tip in the stomach body oriented towards the cardia. Nasogastric tube side port and tip are in the stomach body. Descending thoracic aortic stents noted. Left greater than right basilar airspace opacities, pneumonia is not excluded. IMPRESSION: 1. Feeding tube coils once in the stomach with tip in the stomach body oriented towards the cardia. 2. Nasogastric tube side port and tip in the stomach body. 3. Left greater than right basilar airspace opacities, pneumonia is not excluded. Electronically Signed   By: Gaylyn Rong M.D.   On: 11/05/2022 19:51   DG Abd 1 View  Result  Date: 11/05/2022 CLINICAL DATA:  Abdominal distention EXAM: ABDOMEN - 1 VIEW COMPARISON:  None Available. FINDINGS: Nonobstructive pattern of bowel gas with gas present to the rectum. Scattered stool in the left colon. No obvious free air on supine radiograph. Probable enteric feeding tube, incompletely imaged at the upper extent of the exam. IMPRESSION: Nonobstructive pattern of bowel gas with gas present to the rectum. Scattered stool in the left colon. Electronically Signed   By: Jearld Lesch M.D.   On: 11/05/2022 15:14   DG CHEST PORT 1 VIEW  Result Date: 11/05/2022 CLINICAL DATA:  Shortness of breath EXAM: PORTABLE CHEST 1 VIEW COMPARISON:  10/30/2022 FINDINGS: Redemonstrated endoluminal stent within the aortic arch and descending thoracic aorta. Unchanged cardiac and mediastinal contours. Redemonstrated interstitial and ground-glass opacities. Likely trace right pleural effusion. No definite left pleural effusion. No pneumothorax. No acute osseous abnormality. Enteric tube terminates in the stomach. IMPRESSION: 1. Unchanged appearance of endoluminal stent and cardiomediastinal contours. 2. Redemonstrated interstitial and ground-glass opacities, possibly mild pulmonary edema. Likely trace right pleural effusion. 3. Enteric tube terminates in the stomach. Electronically Signed   By: Wiliam Ke M.D.   On: 11/05/2022 13:09   DG Abd Portable 1V  Result Date: 11/04/2022 CLINICAL DATA:  Feeding tube placement. EXAM: PORTABLE ABDOMEN - 1 VIEW COMPARISON:  09/02/2022. FINDINGS: Feeding tube is looped in the stomach with the tip near the gastric cardia. IMPRESSION: Feeding tube terminates in the stomach. Electronically Signed   By: Leanna Battles M.D.   On: 11/04/2022 12:55   CT ANGIO HEAD NECK W WO CM  Result Date: 11/01/2022  CLINICAL DATA:  Stroke, follow-up EXAM: CT ANGIOGRAPHY HEAD AND NECK WITH AND WITHOUT CONTRAST TECHNIQUE: Multidetector CT imaging of the head and neck was performed using the  standard protocol during bolus administration of intravenous contrast. Multiplanar CT image reconstructions and MIPs were obtained to evaluate the vascular anatomy. Carotid stenosis measurements (when applicable) are obtained utilizing NASCET criteria, using the distal internal carotid diameter as the denominator. RADIATION DOSE REDUCTION: This exam was performed according to the departmental dose-optimization program which includes automated exposure control, adjustment of the mA and/or kV according to patient size and/or use of iterative reconstruction technique. CONTRAST:  75mL OMNIPAQUE IOHEXOL 350 MG/ML SOLN COMPARISON:  No prior CT head available, correlation is made with MRI head 11/01/2022 and CTA chest 10/30/2022 FINDINGS: CT HEAD FINDINGS Brain: No evidence of acute infarct, hemorrhage, mass, mass effect, or midline shift. No hydrocephalus or extra-axial fluid collection. The punctate infarcts noted on the same-day MRI are not discernible on the CT. Vascular: No hyperdense vessel. Skull: Negative for fracture or focal lesion. Sinuses/Orbits: No acute finding. Other: The mastoid air cells are well aerated. CTA NECK FINDINGS Aortic arch: Two-vessel arch with a common origin of the brachiocephalic and left common carotid arteries. Imaged portion shows no evidence of known dissection, now status post graft placement. A stent extends into the origin of left subclavian artery, which appears narrowed as it passes through the aortic graft (series 11, image 348), measuring approximately 2 mm (series 11, image 348), with the proximal and distal diameter of the stent approximately 8 mm. Right carotid system: No evidence of stenosis, dissection, or occlusion. Left carotid system: No evidence of stenosis, dissection, or occlusion. Vertebral arteries: No evidence of stenosis, dissection, or occlusion. Diminished opacification of the left vertebral artery as it ascends in the cervical spine, which may be related to  stenosis in the left subclavian stent. Skeleton: No acute osseous abnormality. Degenerative changes in the cervical spine. Other neck: No acute finding. Upper chest: Small left pleural effusion. Emphysema. Redemonstrated mediastinal hematoma, without evidence of contrast extravasation. Review of the MIP images confirms the above findings CTA HEAD FINDINGS Anterior circulation: Both internal carotid arteries are patent to the termini, without significant stenosis. A1 segments patent. Normal anterior communicating artery. Anterior cerebral arteries are patent to their distal aspects without significant stenosis. No M1 stenosis or occlusion. MCA branches perfused to their distal aspects without significant stenosis. Posterior circulation: Vertebral arteries patent to the vertebrobasilar junction without significant stenosis, although diminished opacification of the left vertebral artery continues intracranially. Posterior inferior cerebellar artery patent on the right. Common origin of the left PICA and AICA a Basilar patent to its distal aspect without significant stenosis. Superior cerebellar arteries patent proximally. Patent P1 segments. PCAs perfused to their distal aspects without significant stenosis. The bilateral posterior communicating arteries are not visualized. Venous sinuses: As permitted by contrast timing, patent. Anatomic variants: None significant. Review of the MIP images confirms the above findings IMPRESSION: 1. No acute intracranial process. 2. No intracranial large vessel occlusion or significant stenosis. 3. No hemodynamically significant stenosis in the carotid and vertebral arteries. 4. Status post aortic arch graft for aortic dissection with left subclavian origin stent, which appears quite narrow as it passes through the graft. There is diminishing opacification of the left vertebral artery as it ascends in the cervical spine, as well as intracranially, concerning for hemodynamically  significant stenosis from stent narrowing. 5. Redemonstrated mediastinal hematoma, without evidence of active contrast extravasation. Imaging results were communicated on 11/01/2022  at 4:08 pm to provider Dr. Amada Jupiter via secure text paging. Electronically Signed   By: Wiliam Ke M.D.   On: 11/01/2022 16:08   MR THORACIC SPINE W WO CONTRAST  Result Date: 11/01/2022 CLINICAL DATA:  72 year old male with paresthesia, numbness and tingling. Aortic dissection status post thoracic aortic repair. EXAM: MRI THORACIC WITHOUT AND WITH CONTRAST TECHNIQUE: Multiplanar and multiecho pulse sequences of the thoracic spine were obtained without and with intravenous contrast. CONTRAST:  10mL GADAVIST GADOBUTROL 1 MMOL/ML IV SOLN COMPARISON:  None Available. FINDINGS: The examination had to be discontinued prior to completion due to patient desaturation. Subsequently, only sagittal and axial T1 postcontrast imaging of the thoracic spine is provided. This is compared with complete Cervical spine MRI today reported separately. Upper thoracic spinal canal is capacious, and below the C6-C7 disc space the upper thoracic spinal cord appears normal to the T3-T4 level on the cervical comparison. These images are affected by aortic endograft related susceptibility artifact 2 add greater degree than the cervical images today. Also wrap artifact is present. Subsequently, detail of the thoracic spinal cord is limited, and only T1 weighted postcontrast thoracic cord imaging is provided here. But the thoracic spinal canal appears capacious. And there is no evidence of thoracic spinal cord swelling or abnormal enhancement. IMPRESSION: 1. Discontinued exam, limited to only T1 postcontrast imaging of the thoracic spine which is significantly degraded from aortic endograft susceptibility artifact. 2. Capacious thoracic spinal canal. Upper thoracic spinal cord seen to be normal on Cervical sagittal today (reported separately). No evidence of  thoracic spinal cord swelling or abnormal enhancement below the T3 level. Electronically Signed   By: [name] M.D.   On: 11/01/2022 10:35   MR CERVICAL SPINE W WO CONTRAST  Result Date: 11/01/2022 CLINICAL DATA:  72 year old male with paresthesia, numbness and tingling. Aortic dissection status post thoracic aortic repair. EXAM: MRI CERVICAL SPINE WITHOUT AND WITH CONTRAST TECHNIQUE: Multiplanar and multiecho pulse sequences of the cervical spine, to include the craniocervical junction and cervicothoracic junction, were obtained without and with intravenous contrast. CONTRAST:  10mL GADAVIST GADOBUTROL 1 MMOL/ML IV SOLN COMPARISON:  Brain MRI without and with contrast 0831 hours today. Previous cervical spine MRI 07/22/2013. FINDINGS: Alignment: Reversal of the normal cervical lordosis, versus more straightening of lordosis in 2015. No significant spondylolisthesis. Vertebrae: No marrow edema or evidence of acute osseous abnormality. Visualized bone marrow signal is within normal limits. Cord: Normal above C6, and capacious cervical spinal canal. At the C6-C7 spinal cord level there is an oval roughly 8 minimally ule ower roughly 8 mm long segment of central right hemi cord T2 and STIR hyperintensity, with the much smaller left hemicord central area of increased signal. See series 11, image 8 and series 16, image 31. No associated enhancement. Mild if any cord expansion there. Thoracic spinal cord today is reported separately. No abnormal intradural enhancement elsewhere.  No dural thickening. Posterior Fossa, vertebral arteries, paraspinal tissues: Cervicomedullary junction is within normal limits. Brain is detailed separately today. Preserved major vascular flow voids in the neck. Trace retained secretions in the pharynx. Right IJ approach vascular catheter. Pronounced left thoracic inlet susceptibility artifact, likely endograft related. Disc levels: Generally mild for age cervical spine degeneration has not  significantly changed from the 2015 MRI and there is a capacious spinal canal with no cervical spinal stenosis. IMPRESSION: 1. Right worse than left C6-C7 spinal cord signal abnormality without enhancement is compatible with subacute cord edema or developing myelomalacia. 2. No other acute  finding in the cervical spine and capacious spinal canal, mild for age cervical spine degeneration. 3. Thoracic MRI reported separately. Electronically Signed   By: [name] M.D.   On: 11/01/2022 10:30   MR BRAIN WO CONTRAST  Result Date: 11/01/2022 CLINICAL DATA:  Numbness or tingling, paresthesia (Ped 0-17y) s/p repair of thoracic aorta EXAM: MRI HEAD WITHOUT CONTRAST TECHNIQUE: Multiplanar, multiecho pulse sequences of the brain and surrounding structures were obtained without intravenous contrast. COMPARISON:  None Available. FINDINGS: Brain: Punctate acute infarcts in the right cerebellum (series 2, image 16), splenium of the corpus callosum on the left (series 2, image 34), right occipital lobe (series 2, image 32), in the right parietal lobe (series 2, image 47), left lentiform nucleus (series 2, image 27). No hemorrhage. No hydrocephalus. No extra-axial fluid collection. Sequela mild overall chronic microvascular ischemic change. Vascular: Normal flow voids. Skull and upper cervical spine: Normal marrow signal. Sinuses/Orbits: No middle ear or mastoid effusion. Paranasal sinuses are notable for mucosal thickening in the bilateral ethmoid sinuses. Orbits are unremarkable. Other: None. IMPRESSION: Punctate acute infarcts in the right cerebellum, splenium of the corpus callosum on the left, right occipital lobe, right parietal lobe, and left lentiform nucleus. Given distribution and appearance these are favored to be embolic in nature. Electronically Signed   By: Lorenza Cambridge M.D.   On: 11/01/2022 09:12   DG Chest Port 1 View  Result Date: 10/30/2022 CLINICAL DATA:  Status post thoracic aortic aneurysm repair EXAM:  PORTABLE CHEST 1 VIEW COMPARISON:  10/30/2022 FINDINGS: Single frontal view of the chest demonstrates interval placement of endoluminal stent graft within the aortic arch and descending thoracic aorta. Stable mediastinal and periaortic hematoma as seen on previous CT. Cardiac silhouette is stable. Increased interstitial and ground-glass opacities throughout the lungs could reflect an element of edema. No effusion or pneumothorax. Right internal jugular catheter tip overlies the superior vena cava. IMPRESSION: 1. Interval endoluminal stent graft repair within the distal aortic arch and descending thoracic aorta. Stable mediastinal and periaortic hematoma. 2. Mild pulmonary edema. 3. No complication after right internal jugular catheter placement. Electronically Signed   By: Sharlet Salina M.D.   On: 10/30/2022 21:44   PERIPHERAL VASCULAR CATHETERIZATION  Result Date: 10/30/2022 See surgical note for result.  EP STUDY  Result Date: 10/30/2022 See surgical note for result.  HYBRID OR IMAGING ([facility] ONLY)  Result Date: 10/30/2022 There is no interpretation for this exam.  This order is for images obtained during a surgical procedure.  Please See "Surgeries" Tab for more information regarding the procedure.   CT Angio Chest/Abd/Pel for Dissection W and/or W/WO  Result Date: 10/30/2022 CLINICAL DATA:  Chest pain. History of aortic dissection. New sudden chest pain radiating to back which started today. EXAM: CT ANGIOGRAPHY CHEST, ABDOMEN AND PELVIS TECHNIQUE: Non-contrast CT of the chest was initially obtained. Multidetector CT imaging through the chest, abdomen and pelvis was performed using the standard protocol during bolus administration of intravenous contrast. Multiplanar reconstructed images and MIPs were obtained and reviewed to evaluate the vascular anatomy. RADIATION DOSE REDUCTION: This exam was performed according to the departmental dose-optimization program which includes automated exposure  control, adjustment of the mA and/or kV according to patient size and/or use of iterative reconstruction technique. CONTRAST:  75mL OMNIPAQUE IOHEXOL 350 MG/ML SOLN COMPARISON:  Prior CT scans from 10/25/2022 and 10/28/2022 FINDINGS: CTA CHEST FINDINGS Cardiovascular: Significant progression of aortic dissection with a focal area of active extravasation and significant hematoma in the mediastinum and  pericardium new since the prior study. No involvement of the ascending aorta is identified. The aortic branch vessels are patent. Progressive intramural hematoma compressing the descending thoracic aorta. Mediastinum/Nodes: Mediastinal hematoma. No obvious mass or adenopathy. The esophagus is grossly normal. Lungs/Pleura: Small left pleural effusion. Streaky bibasilar atelectasis. No pulmonary edema. Stable pulmonary nodules. Musculoskeletal: No significant bony findings. Review of the MIP images confirms the above findings. CTA ABDOMEN AND PELVIS FINDINGS VASCULAR Aorta: Intramural hematoma extends down into the abdominal aorta. There is a new small intramural bleed involving the abdominal aorta just below the right renal artery takeoff. Celiac: Small focal dissection noted in the celiac artery but no occlusion. SMA: Widely patent. Renals: The main right and left renal arteries are normal. There is a small secondary left renal artery. IMA: Patent Inflow: No dissection or aneurysm. Moderate atherosclerotic calcifications. Veins: Patent Review of the MIP images confirms the above findings. NON-VASCULAR Hepatobiliary: Again demonstrated is a large partially necrotic right hepatic lobe mass. Pancreas: No significant findings. Spleen: Status post splenectomy. Adrenals/Urinary Tract: The adrenal glands and kidneys are unremarkable. The bladder is unremarkable. Stomach/Bowel: The stomach, duodenum small and colon are unremarkable. No acute inflammatory process or obstructive findings. Lymphatic: No lymphadenopathy.  Reproductive: The prostate gland is normal. The seminal vesicles are normal. Other: No pelvic mass or adenopathy. No free pelvic fluid collections. No inguinal mass or adenopathy. No abdominal wall hernia or subcutaneous lesions. Musculoskeletal: No significant bony findings. Review of the MIP images confirms the above findings. IMPRESSION: 1. Significant progression of aortic dissection with a focal area of new active extravasation just past the left subclavian artery with a gastric at dimensions pulmonary-matters vascular pulmonary nodules significant hematoma in the mediastinum and pericardium new since the prior study. 2. New small intramural bleed involving the abdominal aorta just below the right renal artery takeoff. 3. Small focal dissection in the celiac artery but no occlusion. 4. Stable large partially necrotic right hepatic lobe mass. 5. Small left pleural effusion and bibasilar atelectasis. These results were called by telephone at the time of interpretation on 10/30/2022 at 3:01 pm to provider Carmell Austria, MD , who verbally acknowledged these results. Electronically Signed   By: Rudie Meyer M.D.   On: 10/30/2022 15:02   DG Chest 2 View  Result Date: 10/30/2022 CLINICAL DATA:  Chest pain, shortness of breath EXAM: CHEST - 2 VIEW COMPARISON:  Previous studies including the CT done on 11-22-2022 and chest radiographs done on 10/25/2022 FINDINGS: Transverse diameter of heart is in the upper limits of normal. Thoracic aorta is ectatic. Increased interstitial markings are seen in both lower lung fields. There is linear density in right parahilar region. There is no pleural effusion or pneumothorax. No new focal pulmonary consolidation is seen. There is no pleural effusion or pneumothorax. IMPRESSION: Increased interstitial markings in both lower lung fields suggest possible scarring. There are no signs of alveolar pulmonary edema or new focal pulmonary consolidation. Electronically Signed   By:  Ernie Avena M.D.   On: 10/30/2022 12:30    Labs:  Basic Metabolic Panel: Recent Labs  Lab 11/23/22 0529 11/24/22 1102 11/27/22 1552  NA 130* 132* 132*  K 3.8 3.6 3.4*  CL 102 100 104  CO2 19* 19* 20*  GLUCOSE 98 105* 80  BUN 19 13 11   CREATININE 1.13 1.16 0.90  CALCIUM 8.0* 8.4* 8.2*  MG  --  1.6*  --   PHOS  --  2.6  --     CBC: Recent  Labs  Lab 11/25/22 0722  WBC 13.3*  NEUTROABS 8.2*  HGB 12.3*  HCT 38.5*  MCV 80.5  PLT 412*    CBG: Recent Labs  Lab 11/24/22 0316 11/15/2022 0449  GLUCAP 104* 99    Brief HPI:   Mathew [name] is a 72 y.o. 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  Per chart review lives with spouse independent and working prior to admission.  Presented 10/25/2018 for chest pain radiating to the abdomen.  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  He was started on nitroglycerin infusion.  Given need for tight hemodynamic control PCCM consulted for further management admission.  VVS and cardiology consulted.  Initially required Esmolol drip, weaned off with the addition of multiple p.o. medications.  Blood pressures normalized deemed stable for discharge 10/29/2022 due to expansion of dissection ultimately returning on 6/26 for worsening pain imaging confirmed aortic rupture requiring endovascular repair by vascular surgery.  Hospital course 11/01/2022 left lower extremity weakness subacute onset.  MRI showed punctate acute infarcts in the right cerebellum splenium of the corpus callosum on the left right occipital lobe right parietal lobe and left lentiform.  CT angiogram head and neck no acute intracranial process or large vessel occlusion.  Neurology  follow-up cleared for low-dose aspirin.  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  Mild AKI improved with gentle IV fluids latest creatinine 1.17.  He was cleared to begin subcutaneous heparin for DVT prophylaxis.  Palliative care consulted to establish goals of care.  Patient was admitted to see our services   Hospital Course: Mathew [name] was admitted to rehab 11/07/2022 for inpatient therapies to consist of PT, ST and OT at least three hours five days a week. Past admission physiatrist, therapy team and rehab RN have worked together to provide customized collaborative inpatient rehab.  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  Patient attending therapy for slow progressive gains.  Subcutaneous heparin for DVT prophylaxis he had been cleared for low-dose aspirin.  Followed by oncology services Dr. 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  He also did have a history of prostate cancer with prostatectomy in the past.  Blood pressure soft and monitored on Norvasc as well as Toprol.  Chronic nausea maintained on nausea medication with good results.  Decreased nutritional stores was on a regular diet.  On the early morning of 11/28/2022 patient with headache received Tylenol bouts of nausea which was chronic received Zofran he became weak and dysarthric.  Cranial CT scan completed showing large acute cerebellar hemorrhage with extra-axial extension of blood in the right posterior fossa.  Estimated 32 mm right cerebellar hematoma with a 4 mm thick adjacent probable SDH.  Cerebellar edema and posterior fossa mass effect including on the brainstem and fourth ventricle.  Partially effaced basilar cisterns  and early lateral and third ventriculomegaly.  Code stroke was called.  Patient was made comfort care after long discussion with spouse.  Patient did  expire later that morning at approximately 7:40 AM.   Blood pressures were monitored on TID basis and monitore   Rehab course: During patient's stay in rehab weekly team conferences were held to monitor patient's progress, set goals and discuss barriers to discharge. At admission, patient required  He/She  has had improvement in activity tolerance, balance, postural control as well as ability to compensate for deficits. He/She has had improvement in functional use RUE/LUE  and RLE/LLE as well as improvement in awareness.  During patient's rehab stay he was showing progressive gains ambulating 100 feet contact-guard.  Working with energy conservation techniques.       Disposition: Expired  30-35 minutes was spent completing discharge summary discharge planning    Discharge Instructions     Ambulatory referral to Neurology   Complete by: As directed    An appointment is requested in approximately: 4 weeks bilateral multifocal infarction   Ambulatory referral to Physical Medicine Rehab   Complete by: As directed    Moderate complexity follow-up 1 to 2 weeks CVA        Follow-up Information     Raulkar, Drema Pry, MD Follow up.   Specialty: Physical Medicine and Rehabilitation Why: Office to call for appointment Contact information: [address] [place] [address] [phone]         Malachy Mood, MD Follow up.   Specialties: Hematology, Oncology Why: Call for appointment Contact information: [address] [place] [address] [phone]         Cephus Shelling, MD Follow up.   Specialty: Vascular Surgery Why: Call for appointment Contact information: [address] [place] [address] [phone]         Tonny Bollman, MD Follow up.   Specialty: Cardiology Why: Call  for appointment Contact information: [address] [place] [address] [phone]         Barbie Banner, MD Follow up.   Specialty: Family Medicine Contact information: [address] [place] [address] [phone]                 Signed: Mcarthur Rossetti Shaya Altamura 11/09/2022, 8:03 AM

## 2022-12-05 NOTE — Code Documentation (Signed)
Stroke Response Nurse Documentation Code Documentation  Cylar Lapa is a 72 y.o. male on 4W20 with past medical hx of hepatocellular carcinoma, HTN, prostate CA, Renal cell carcinoma, Hep C, OSA, aortic dissection, CVA. On aspirin 81 mg daily. Code stroke was activated by 4W RN   Patient c/o headache and was given tylenol around 2am followed by n/v around 0300 with zofran given. He then became weak and dysarthric and CT head was ordered STAT by primary team. Dr Iver Nestle notified of CT results by radiology.  NIHSS 18, see documentation for details and code stroke times. The following imaging was completed:  CT Head. Patient is not a candidate for IV Thrombolytic due to Cerebellar Hemorrhage. Patient is not a candidate for IR due to pt made comfort care by family.   Care Plan: Comfort care.   Bedside handoff with Network engineer.    Mordecai Rasmussen  Stroke Response RN

## 2022-12-05 NOTE — Plan of Care (Signed)
Patient expired.

## 2022-12-05 NOTE — Progress Notes (Signed)
Pt complained of headache around 0200, on call provider called as no prns were available. PRN Tylenol was ordered and administered. At 0256 pt called complain of nausea and proceeded to vomit twice. PRN IV Zofran was administered. Followed with repeat vital signs at 0344. On call provider notified, Stat CT ordered, primary nurse and charge nurse transferred pt to CT, while in transit and in CT pt became nonverbal but continued to follow commands. Upon returning to unit with pt, pt able to mouth his needs at the time. Continued asking for water. CT notified Neurology prior to calling unit, while code stroke was being called neurologist appeared on unit to assess pt and speak with pts wife. Comfort care was decided.

## 2022-12-05 NOTE — Progress Notes (Addendum)
PROGRESS NOTE   Subjective/Complaints: Patient passed away today, consoled his wife in hallway, she says patient stated yesterday that he felt he was not going to make it and was able to speak with his sisters before passing  ROS: Unobtainable  Objective:   CT HEAD WO CONTRAST ( )  Addendum Date: 11/18/2022   ADDENDUM REPORT: 11/28/2022 05:34 ADDENDUM: Study discussed by telephone with Neurology Dr. Rollene Fare on 11/25/2022 at 0456 hours. Electronically Signed   By: [name] M.D.   On: 12/03/2022 05:34   Result Date: 11/10/2022 CLINICAL DATA:  72 year old male with headache. Recent small infarct at the splenium of the corpus callosum. Status post aortic dissection repair last month. EXAM: CT HEAD WITHOUT CONTRAST TECHNIQUE: Contiguous axial images were obtained from the base of the skull through the vertex without intravenous contrast. RADIATION DOSE REDUCTION: This exam was performed according to the departmental dose-optimization program which includes automated exposure control, adjustment of the mA and/or kV according to patient size and/or use of iterative reconstruction technique. COMPARISON:  Brain MRI 11/27/2022 and earlier. FINDINGS: Brain: Hyperdense hemorrhage within the right cerebellum, and extra-axial blood extension within the right posterior fossa (series 4, image 10). Intra-axial hemorrhage epicenter within the right cerebellum is posterior to the deep cerebellar nuclei. Heterogeneous hyperdense intra-axial hematoma size is 40 x 50 x 32 mm (AP by transverse by CC), for estimated intra-axial blood volume of 32 mL. Superimposed 4 mm thick extra-axial blood posterior to the right cerebellum, more resembles subdural than subarachnoid hemorrhage. Moderate superimposed hypodense cerebellar edema. Posterior fossa mass effect including on the dorsal brainstem and subtotal effacement of the 4th ventricle (series 4, image 10). Mild lateral  and 3rd ventricular enlargement compared to 11/27/2022. No supratentorial transependymal edema at this time. Effaced pre medullary and prepontine cisterns, but no tonsillar herniation at this time. Unremarkable noncontrast MRI appearance of the right cerebellar hemisphere 2 days ago. Unclear whether this is an acute hemorrhagic infarct, acute small vessel disease, or sequelae of coagulopathy. No other acute intracranial hemorrhage. Stable supratentorial gray-white differentiation. No supratentorial midline shift. Vascular: No suspicious intracranial vascular hyperdensity. Skull: Intact.  No osseous abnormality identified. Sinuses/Orbits: Visualized paranasal sinuses and mastoids are stable and well aerated. Other: Leftward gaze. Visualized scalp soft tissues are within normal limits. IMPRESSION: 1. Positive for large acute Cerebellar Hemorrhage with extra-axial extension of blood in the right posterior fossa. Estimated 32 mL right cerebellar hematoma, with 4 mm thick adjacent probable SDH. 2. Cerebellar edema and posterior fossa mass effect including on the brainstem and 4th ventricle. Partially effaced basilar cisterns and early lateral and 3rd ventriculomegaly. No tonsillar herniation and no other supratentorial mass effect at this time. Electronically Signed: By: [name] M.D. On: 11/20/2022 04:53   VAS [place] LOWER EXTREMITY VENOUS (DVT)  Result Date: 11/27/2022  Lower Venous DVT Study Patient Name:  Mathew [name]  Date of Exam:   11/27/2022 Medical Rec #: [id number]     Accession #:    [id number] Date of Birth: April 30, 1951    Patient Gender: M Patient Age:   72 years Exam Location:  [facility] Procedure:      VAS [place] LOWER EXTREMITY VENOUS (  DVT) Referring Phys: Sula Soda --------------------------------------------------------------------------------  Indications: Swelling.  Comparison Study: No previous study. Performing Technologist: McKayla Maag RVT, [place]  Examination Guidelines: A complete evaluation  includes B-mode imaging, spectral Doppler, color Doppler, and power Doppler as needed of all accessible portions of each vessel. Bilateral testing is considered an integral part of a complete examination. Limited examinations for reoccurring indications may be performed as noted. The reflux portion of the exam is performed with the patient in reverse Trendelenburg.  +-----+---------------+---------+-----------+----------+--------------+ RIGHTCompressibilityPhasicitySpontaneityPropertiesThrombus Aging +-----+---------------+---------+-----------+----------+--------------+ CFV  Full           Yes      Yes                                 +-----+---------------+---------+-----------+----------+--------------+ SFJ  Full                                                        +-----+---------------+---------+-----------+----------+--------------+   +---------+---------------+---------+-----------+----------+--------------+ LEFT     CompressibilityPhasicitySpontaneityPropertiesThrombus Aging +---------+---------------+---------+-----------+----------+--------------+ CFV      Full           Yes      Yes                                 +---------+---------------+---------+-----------+----------+--------------+ SFJ      Full                                                        +---------+---------------+---------+-----------+----------+--------------+ FV Prox  Full                                                        +---------+---------------+---------+-----------+----------+--------------+ FV Mid   Full                                                        +---------+---------------+---------+-----------+----------+--------------+ FV DistalFull                                                        +---------+---------------+---------+-----------+----------+--------------+ PFV      Full                                                         +---------+---------------+---------+-----------+----------+--------------+ POP      Full           Yes      Yes                                 +---------+---------------+---------+-----------+----------+--------------+  PTV      Full                                                        +---------+---------------+---------+-----------+----------+--------------+ PERO     Full                                                        +---------+---------------+---------+-----------+----------+--------------+     Summary: RIGHT: - No evidence of common femoral vein obstruction.  LEFT: - There is no evidence of deep vein thrombosis in the lower extremity.  - No cystic structure found in the popliteal fossa.  *See table(s) above for measurements and observations. Electronically signed by Heath Lark on 11/27/2022 at 7:44:13 PM.    Final    MR BRAIN WO CONTRAST  Result Date: 11/27/2022 CLINICAL DATA:  Worsening left-sided weakness. EXAM: MRI HEAD WITHOUT CONTRAST TECHNIQUE: Multiplanar, multiecho pulse sequences of the brain and surrounding structures were obtained without intravenous contrast. COMPARISON:  MRI brain 11/01/2022. FINDINGS: Brain: Subacute infarct in the left aspect of the splenium of the corpus callosum. No acute infarct or hemorrhage. No mass effect or midline shift. Background of mild chronic small-vessel disease. No hydrocephalus or extra-axial collection. No foci of abnormal susceptibility. Vascular: Normal flow voids. Skull and upper cervical spine: Normal marrow signal. Sinuses/Orbits: Unremarkable. Other: None. IMPRESSION: Subacute infarct in the left aspect of the splenium of the corpus callosum. No acute intracranial process. Electronically Signed   By: Orvan Falconer M.D.   On: 11/27/2022 13:47   No results for input(s): "WBC", "HGB", "HCT", "PLT" in the last 72 hours.   Recent Labs    11/27/22 1552  NA 132*  K 3.4*  CL 104  CO2 20*  GLUCOSE 80  BUN 11   CREATININE 0.90  CALCIUM 8.2*    Intake/Output Summary (Last 24 hours) at 11/15/2022 0907 Last data filed at 11/25/2022 0200 Gross per 24 hour  Intake 472 ml  Output 500 ml  Net -28 ml        Physical Exam: Vital Signs Blood pressure (!) 152/96, pulse (!) 110, temperature (!) 97.5 F (36.4 C), resp. rate 18, height 6\' 3"  (1.905 m), weight 89.3 kg, SpO2 100%. Gen: deceased, family is at bedside  Assessment/Plan: 1. Functional deficits which require 3+ hours per day of interdisciplinary therapy in a comprehensive inpatient rehab setting. Physiatrist is providing close team supervision and 24 hour management of active medical problems listed below. Physiatrist and rehab team continue to assess barriers to discharge/monitor patient progress toward functional and medical goals  Care Tool:  Bathing    Body parts bathed by patient: Right arm, Abdomen, Chest, Left arm, Front perineal area, Right upper leg, Left upper leg, Face, Right lower leg, Left lower leg   Body parts bathed by helper: Buttocks     Bathing assist Assist Level: Minimal Assistance - Patient > 75%     Upper Body Dressing/Undressing Upper body dressing   What is the patient wearing?: Pull over shirt    Upper body assist Assist Level: Set up assist    Lower Body Dressing/Undressing Lower body dressing      What  is the patient wearing?: Incontinence brief, Pants     Lower body assist Assist for lower body dressing: Moderate Assistance - Patient 50 - 74%     Toileting Toileting Toileting Activity did not occur Press photographer and hygiene only): N/A (no void or bm)  Toileting assist Assist for toileting: Minimal Assistance - Patient > 75%     Transfers Chair/bed transfer  Transfers assist     Chair/bed transfer assist level: Contact Guard/Touching assist     Locomotion Ambulation   Ambulation assist      Assist level: Minimal Assistance - Patient > 75% Assistive device:  Walker-rolling Max distance: 67ft   Walk 10 feet activity   Assist     Assist level: Minimal Assistance - Patient > 75% Assistive device: Walker-rolling   Walk 50 feet activity   Assist    Assist level: Minimal Assistance - Patient > 75% Assistive device: Walker-rolling    Walk 150 feet activity   Assist Walk 150 feet activity did not occur: Safety/medical concerns (fatigue)         Walk 10 feet on uneven surface  activity   Assist Walk 10 feet on uneven surfaces activity did not occur: Safety/medical concerns         Wheelchair     Assist Is the patient using a wheelchair?: Yes Type of Wheelchair: Manual    Wheelchair assist level: Dependent - Patient 0%      Wheelchair 50 feet with 2 turns activity    Assist        Assist Level: Dependent - Patient 0%   Wheelchair 150 feet activity     Assist      Assist Level: Dependent - Patient 0%   Blood pressure (!) 152/96, pulse (!) 110, temperature (!) 97.5 F (36.4 C), resp. rate 18, height 6\' 3"  (1.905 m), weight 89.3 kg, SpO2 100%.  Medical Problem List and Plan: 1. Functional deficits secondary to acute bilateral multifocal embolic/ischemic infarcts after ruptured type B aortic dissection status post emergent T EVAR complicated by spinal cord ischemia             Patient passed away today due to his terminal cancer, consoled wife, family is at bedside, chaplain consulted, discussed palliative care bereavement services, will complete death certificate today    LOS: 10 days A FACE TO FACE EVALUATION WAS PERFORMED  Twila Rappa P Taya Ashbaugh 11/27/2022, 9:07 AM

## 2022-12-05 NOTE — Progress Notes (Addendum)
Inpatient Rehabilitation Care Coordinator Discharge Note   Patient Details  Name: Mathew [name] MRN: [id number] Date of Birth: 1950-07-01   Discharge location: Passed away on unit  Length of Stay: 10 days  Discharge activity level: min level  Home/community participation: active  Patient response [id number] Literacy - How often do you need to have someone help you when you read instructions, pamphlets, or other written material from your doctor or pharmacy?: Never-unable to respond  Patient response LO:VFIEPP Isolation - How often do you feel lonely or isolated from those around you?: Patient unable to respond  Services provided included: MD, RD, PT, OT, SLP, RN, CM, Pharmacy, SW, Neuropsych  Financial Services:  Field seismologist Utilized: [employer]  Choices offered to/list presented to: pt and wife  Follow-up services arranged:  Other (Comment)           Patient response to transportation need: Is the patient able to respond to transportation needs?: Yes In the past 12 months, has lack of transportation kept you from medical appointments or from getting medications?: No In the past 12 months, has lack of transportation kept you from meetings, work, or from getting things needed for daily living?: No unable to respond   Patient/Family verbalized understanding of follow-up arrangements:  Yes  Individual responsible for coordination of the follow-up plan: self and melba-wife  [phone]  Confirmed correct DME delivered: [name] 11/05/2022    Comments (or additional information):patient passed away early this am  Summary of Stay    Date/Time Discharge Planning CSW  11/27/22 0839 Home with wife who can provide assist. Deciding between home with home health versus home with hospice. Discussed both with, aware pt is incurable and will decline at home. Will need family education with wife prior to DC home. RGD  11/20/22 0818 New evaluation-home with  wife and may need to hire assist. Will await team's evaluations today RGD       Deion Forgue, Lemar Livings

## 2022-12-05 NOTE — Progress Notes (Signed)
Physical Therapy Note  Patient Details  Name: Mathew [name] MRN: [id number] Date of Birth: 10-Aug-1950 Today's Date: 11/15/2022    Physical Therapy Discharge Note  This patient was unable to complete the inpatient rehab program due to unplanned medical event. Pt now on comfort care; therefore did not meet their long term goals. Pt left the program at a CGA assist level for their functional mobility/ transfers. This patient is being discharged from PT services at this time.  Pt's perception of pain in the last five days was unable to answer at this time.     Dionne Milo 11/22/2022, 7:53 AM

## 2022-12-05 NOTE — Progress Notes (Signed)
This patient was unable to complete the inpatient rehab program due to his unplanned death.  This patient is being discharged from SLP services at this time.   Rodarius Kichline M.A., CF-SLP

## 2022-12-05 NOTE — Progress Notes (Signed)
Patient ID: Mathew [name], male   DOB: [birth year], 72 y.o.   MRN: [id number]  Patient transferred to the [facility].

## 2022-12-05 NOTE — Plan of Care (Signed)
Patient expired this AM

## 2022-12-05 NOTE — Progress Notes (Signed)
Inpatient Rehabilitation Discharge Medication Review by a Pharmacist  A complete drug regimen review was completed for this patient to identify any potential clinically significant medication issues.  High Risk Drug Classes Is patient taking? Indication by Medication  Antipsychotic No   Anticoagulant No   Antibiotic No   Opioid No   Antiplatelet No   Hypoglycemics/insulin No   Vasoactive Medication No   Chemotherapy No   Other No      Type of Medication Issue Identified Description of Issue Recommendation(s)  Drug Interaction(s) (clinically significant)     Duplicate Therapy     Allergy     No Medication Administration End Date     Incorrect Dose     Additional Drug Therapy Needed     Significant med changes from prior encounter (inform family/care partners about these prior to discharge).    Other       Clinically significant medication issues were identified that warrant physician communication and completion of prescribed/recommended actions by midnight of the next day:  No   Pharmacist comments: Patient deceased 12/07/22 09:20  Time spent performing this drug regimen review (minutes):  5   Terrall Bley BS, PharmD, BCPS Clinical Pharmacist 12-07-22 11:38 AM  Contact: [phone] after 3 PM  "Be curious, not judgmental..." -Debbora Dus

## 2022-12-05 DEATH — deceased

## 2022-12-25 ENCOUNTER — Ambulatory Visit: Payer: BC Managed Care – PPO | Admitting: Hematology

## 2022-12-25 ENCOUNTER — Ambulatory Visit: Payer: BC Managed Care – PPO

## 2022-12-25 ENCOUNTER — Other Ambulatory Visit: Payer: BC Managed Care – PPO
